# Patient Record
Sex: Female | Born: 1965 | Race: Black or African American | Hispanic: Refuse to answer | Marital: Single | Smoking: Never smoker
Health system: Southern US, Community
[De-identification: ages and names within clinical notes are randomized; demographics above are authoritative.]

## PROBLEM LIST (undated history)

## (undated) DIAGNOSIS — F32A Depression, unspecified: Secondary | ICD-10-CM

## (undated) DIAGNOSIS — R296 Repeated falls: Secondary | ICD-10-CM

## (undated) DIAGNOSIS — E785 Hyperlipidemia, unspecified: Secondary | ICD-10-CM

## (undated) DIAGNOSIS — F988 Other specified behavioral and emotional disorders with onset usually occurring in childhood and adolescence: Secondary | ICD-10-CM

## (undated) DIAGNOSIS — I10 Essential (primary) hypertension: Secondary | ICD-10-CM

## (undated) DIAGNOSIS — K219 Gastro-esophageal reflux disease without esophagitis: Secondary | ICD-10-CM

## (undated) DIAGNOSIS — F4010 Social phobia, unspecified: Secondary | ICD-10-CM

## (undated) DIAGNOSIS — R079 Chest pain, unspecified: Secondary | ICD-10-CM

## (undated) DIAGNOSIS — F329 Major depressive disorder, single episode, unspecified: Secondary | ICD-10-CM

## (undated) DIAGNOSIS — F410 Panic disorder [episodic paroxysmal anxiety] without agoraphobia: Secondary | ICD-10-CM

## (undated) DIAGNOSIS — G4730 Sleep apnea, unspecified: Secondary | ICD-10-CM

## (undated) DIAGNOSIS — R2689 Other abnormalities of gait and mobility: Secondary | ICD-10-CM

## (undated) DIAGNOSIS — J45909 Unspecified asthma, uncomplicated: Secondary | ICD-10-CM

## (undated) DIAGNOSIS — D649 Anemia, unspecified: Secondary | ICD-10-CM

## (undated) DIAGNOSIS — F419 Anxiety disorder, unspecified: Secondary | ICD-10-CM

## (undated) DIAGNOSIS — J449 Chronic obstructive pulmonary disease, unspecified: Secondary | ICD-10-CM

## (undated) DIAGNOSIS — IMO0001 Reserved for inherently not codable concepts without codable children: Secondary | ICD-10-CM

## (undated) DIAGNOSIS — R7303 Prediabetes: Secondary | ICD-10-CM

## (undated) HISTORY — PX: COSMETIC SURGERY: SHX468

## (undated) HISTORY — DX: Hyperlipidemia, unspecified: E78.5

## (undated) HISTORY — DX: Other specified behavioral and emotional disorders with onset usually occurring in childhood and adolescence: F98.8

## (undated) HISTORY — PX: ABDOMINAL HYSTERECTOMY: SHX81

## (undated) HISTORY — DX: Essential (primary) hypertension: I10

## (undated) HISTORY — DX: Anxiety disorder, unspecified: F41.9

## (undated) HISTORY — DX: Social phobia, unspecified: F40.10

## (undated) HISTORY — DX: Depression, unspecified: F32.A

## (undated) HISTORY — DX: Prediabetes: R73.03

## (undated) HISTORY — DX: Chronic obstructive pulmonary disease, unspecified: J44.9

## (undated) HISTORY — PX: CHOLECYSTECTOMY: SHX55

## (undated) HISTORY — DX: Major depressive disorder, single episode, unspecified: F32.9

## (undated) HISTORY — DX: Anemia, unspecified: D64.9

## (undated) HISTORY — PX: FRACTURE SURGERY: SHX138

## (undated) HISTORY — PX: OTHER SURGICAL HISTORY: SHX169

## (undated) HISTORY — PX: ANKLE SURGERY: SHX546

---

## 2000-04-13 ENCOUNTER — Encounter: Admission: RE | Admit: 2000-04-13 | Discharge: 2000-05-22 | Payer: Self-pay | Admitting: Orthopedic Surgery

## 2000-12-04 ENCOUNTER — Encounter (INDEPENDENT_AMBULATORY_CARE_PROVIDER_SITE_OTHER): Payer: Self-pay | Admitting: Specialist

## 2000-12-04 ENCOUNTER — Ambulatory Visit (HOSPITAL_BASED_OUTPATIENT_CLINIC_OR_DEPARTMENT_OTHER): Admission: RE | Admit: 2000-12-04 | Discharge: 2000-12-04 | Payer: Self-pay | Admitting: Orthopedic Surgery

## 2001-04-11 ENCOUNTER — Emergency Department (HOSPITAL_COMMUNITY): Admission: EM | Admit: 2001-04-11 | Discharge: 2001-04-12 | Payer: Self-pay | Admitting: *Deleted

## 2001-06-11 ENCOUNTER — Emergency Department (HOSPITAL_COMMUNITY): Admission: EM | Admit: 2001-06-11 | Discharge: 2001-06-11 | Payer: Self-pay | Admitting: Emergency Medicine

## 2001-06-11 ENCOUNTER — Encounter: Payer: Self-pay | Admitting: Emergency Medicine

## 2001-08-20 ENCOUNTER — Encounter: Payer: Self-pay | Admitting: Obstetrics and Gynecology

## 2001-08-20 ENCOUNTER — Ambulatory Visit (HOSPITAL_COMMUNITY): Admission: RE | Admit: 2001-08-20 | Discharge: 2001-08-20 | Payer: Self-pay | Admitting: Obstetrics and Gynecology

## 2002-03-05 ENCOUNTER — Emergency Department (HOSPITAL_COMMUNITY): Admission: EM | Admit: 2002-03-05 | Discharge: 2002-03-05 | Payer: Self-pay | Admitting: Emergency Medicine

## 2002-12-13 ENCOUNTER — Encounter: Payer: Self-pay | Admitting: Internal Medicine

## 2002-12-13 ENCOUNTER — Ambulatory Visit (HOSPITAL_COMMUNITY): Admission: RE | Admit: 2002-12-13 | Discharge: 2002-12-13 | Payer: Self-pay | Admitting: Internal Medicine

## 2003-11-13 ENCOUNTER — Other Ambulatory Visit: Admission: RE | Admit: 2003-11-13 | Discharge: 2003-11-13 | Payer: Self-pay | Admitting: Obstetrics and Gynecology

## 2004-06-08 ENCOUNTER — Emergency Department (HOSPITAL_COMMUNITY): Admission: EM | Admit: 2004-06-08 | Discharge: 2004-06-08 | Payer: Self-pay | Admitting: Family Medicine

## 2004-06-30 ENCOUNTER — Emergency Department (HOSPITAL_COMMUNITY): Admission: EM | Admit: 2004-06-30 | Discharge: 2004-06-30 | Payer: Self-pay | Admitting: Emergency Medicine

## 2004-09-17 ENCOUNTER — Emergency Department (HOSPITAL_COMMUNITY): Admission: EM | Admit: 2004-09-17 | Discharge: 2004-09-17 | Payer: Self-pay | Admitting: Emergency Medicine

## 2004-11-14 ENCOUNTER — Other Ambulatory Visit: Admission: RE | Admit: 2004-11-14 | Discharge: 2004-11-14 | Payer: Self-pay | Admitting: Obstetrics and Gynecology

## 2005-09-17 ENCOUNTER — Emergency Department (HOSPITAL_COMMUNITY): Admission: EM | Admit: 2005-09-17 | Discharge: 2005-09-17 | Payer: Self-pay | Admitting: Emergency Medicine

## 2006-10-13 ENCOUNTER — Emergency Department (HOSPITAL_COMMUNITY): Admission: EM | Admit: 2006-10-13 | Discharge: 2006-10-13 | Payer: Self-pay | Admitting: Emergency Medicine

## 2006-10-22 ENCOUNTER — Ambulatory Visit: Payer: Self-pay | Admitting: Family Medicine

## 2006-11-02 ENCOUNTER — Encounter: Payer: Self-pay | Admitting: Family Medicine

## 2006-11-02 DIAGNOSIS — I10 Essential (primary) hypertension: Secondary | ICD-10-CM | POA: Insufficient documentation

## 2006-11-02 DIAGNOSIS — F411 Generalized anxiety disorder: Secondary | ICD-10-CM

## 2006-11-02 DIAGNOSIS — F329 Major depressive disorder, single episode, unspecified: Secondary | ICD-10-CM

## 2006-11-13 ENCOUNTER — Ambulatory Visit: Payer: Self-pay | Admitting: Family Medicine

## 2006-11-19 ENCOUNTER — Emergency Department (HOSPITAL_COMMUNITY): Admission: EM | Admit: 2006-11-19 | Discharge: 2006-11-20 | Payer: Self-pay | Admitting: Emergency Medicine

## 2006-12-11 ENCOUNTER — Ambulatory Visit: Payer: Self-pay | Admitting: Family Medicine

## 2006-12-12 ENCOUNTER — Encounter (INDEPENDENT_AMBULATORY_CARE_PROVIDER_SITE_OTHER): Payer: Self-pay | Admitting: Family Medicine

## 2007-01-08 ENCOUNTER — Ambulatory Visit: Payer: Self-pay | Admitting: Family Medicine

## 2007-01-13 ENCOUNTER — Ambulatory Visit (HOSPITAL_COMMUNITY): Admission: RE | Admit: 2007-01-13 | Discharge: 2007-01-13 | Payer: Self-pay | Admitting: Family Medicine

## 2007-01-13 ENCOUNTER — Encounter (INDEPENDENT_AMBULATORY_CARE_PROVIDER_SITE_OTHER): Payer: Self-pay | Admitting: Family Medicine

## 2007-01-14 ENCOUNTER — Telehealth (INDEPENDENT_AMBULATORY_CARE_PROVIDER_SITE_OTHER): Payer: Self-pay | Admitting: Family Medicine

## 2007-01-15 ENCOUNTER — Telehealth (INDEPENDENT_AMBULATORY_CARE_PROVIDER_SITE_OTHER): Payer: Self-pay | Admitting: Family Medicine

## 2007-01-29 ENCOUNTER — Other Ambulatory Visit: Admission: RE | Admit: 2007-01-29 | Discharge: 2007-01-29 | Payer: Self-pay | Admitting: Family Medicine

## 2007-01-29 ENCOUNTER — Ambulatory Visit: Payer: Self-pay | Admitting: Family Medicine

## 2007-01-29 DIAGNOSIS — E785 Hyperlipidemia, unspecified: Secondary | ICD-10-CM | POA: Insufficient documentation

## 2007-01-29 LAB — CONVERTED CEMR LAB: Pap Smear: NORMAL

## 2007-02-03 ENCOUNTER — Telehealth (INDEPENDENT_AMBULATORY_CARE_PROVIDER_SITE_OTHER): Payer: Self-pay | Admitting: Family Medicine

## 2007-02-08 ENCOUNTER — Encounter (INDEPENDENT_AMBULATORY_CARE_PROVIDER_SITE_OTHER): Payer: Self-pay | Admitting: Family Medicine

## 2007-02-08 ENCOUNTER — Ambulatory Visit (HOSPITAL_COMMUNITY): Admission: RE | Admit: 2007-02-08 | Discharge: 2007-02-08 | Payer: Self-pay | Admitting: Family Medicine

## 2007-02-09 ENCOUNTER — Encounter (INDEPENDENT_AMBULATORY_CARE_PROVIDER_SITE_OTHER): Payer: Self-pay | Admitting: Family Medicine

## 2007-02-18 ENCOUNTER — Encounter (INDEPENDENT_AMBULATORY_CARE_PROVIDER_SITE_OTHER): Payer: Self-pay | Admitting: Family Medicine

## 2007-04-30 ENCOUNTER — Ambulatory Visit: Payer: Self-pay | Admitting: Family Medicine

## 2007-04-30 ENCOUNTER — Encounter (INDEPENDENT_AMBULATORY_CARE_PROVIDER_SITE_OTHER): Payer: Self-pay | Admitting: Internal Medicine

## 2007-05-10 ENCOUNTER — Telehealth (INDEPENDENT_AMBULATORY_CARE_PROVIDER_SITE_OTHER): Payer: Self-pay | Admitting: Family Medicine

## 2007-05-10 ENCOUNTER — Ambulatory Visit: Payer: Self-pay | Admitting: Cardiology

## 2007-05-11 ENCOUNTER — Inpatient Hospital Stay (HOSPITAL_COMMUNITY): Admission: EM | Admit: 2007-05-11 | Discharge: 2007-05-11 | Payer: Self-pay | Admitting: Emergency Medicine

## 2007-05-14 ENCOUNTER — Encounter (HOSPITAL_COMMUNITY): Admission: RE | Admit: 2007-05-14 | Discharge: 2007-06-13 | Payer: Self-pay | Admitting: Internal Medicine

## 2007-05-14 ENCOUNTER — Ambulatory Visit: Payer: Self-pay | Admitting: Internal Medicine

## 2007-05-21 ENCOUNTER — Ambulatory Visit: Payer: Self-pay | Admitting: Family Medicine

## 2007-05-21 DIAGNOSIS — J449 Chronic obstructive pulmonary disease, unspecified: Secondary | ICD-10-CM

## 2007-05-22 ENCOUNTER — Encounter (INDEPENDENT_AMBULATORY_CARE_PROVIDER_SITE_OTHER): Payer: Self-pay | Admitting: Family Medicine

## 2007-07-02 ENCOUNTER — Ambulatory Visit: Payer: Self-pay | Admitting: Family Medicine

## 2007-07-04 LAB — CONVERTED CEMR LAB
BUN: 9 mg/dL (ref 6–23)
Calcium: 8.9 mg/dL (ref 8.4–10.5)
Chloride: 106 meq/L (ref 96–112)
Potassium: 4.1 meq/L (ref 3.5–5.3)
Sodium: 138 meq/L (ref 135–145)

## 2007-07-05 ENCOUNTER — Telehealth (INDEPENDENT_AMBULATORY_CARE_PROVIDER_SITE_OTHER): Payer: Self-pay | Admitting: *Deleted

## 2007-07-15 ENCOUNTER — Encounter (INDEPENDENT_AMBULATORY_CARE_PROVIDER_SITE_OTHER): Payer: Self-pay | Admitting: Family Medicine

## 2007-07-23 ENCOUNTER — Ambulatory Visit: Payer: Self-pay | Admitting: Family Medicine

## 2007-08-11 ENCOUNTER — Ambulatory Visit: Payer: Self-pay | Admitting: Family Medicine

## 2007-09-07 ENCOUNTER — Ambulatory Visit: Payer: Self-pay | Admitting: Family Medicine

## 2007-09-07 LAB — CONVERTED CEMR LAB
Bilirubin Urine: NEGATIVE
Glucose, Urine, Semiquant: NEGATIVE
Ketones, urine, test strip: NEGATIVE
Protein, U semiquant: NEGATIVE
Specific Gravity, Urine: 1.02
Urobilinogen, UA: 4
WBC Urine, dipstick: NEGATIVE
pH: 6.5

## 2007-09-08 LAB — CONVERTED CEMR LAB
Basophils Absolute: 0.1 10*3/uL (ref 0.0–0.1)
CO2: 21 meq/L (ref 19–32)
Calcium: 8.9 mg/dL (ref 8.4–10.5)
Chloride: 105 meq/L (ref 96–112)
Eosinophils Absolute: 0.1 10*3/uL (ref 0.0–0.7)
Ferritin: 4 ng/mL — ABNORMAL LOW (ref 10–291)
Glucose, Bld: 91 mg/dL (ref 70–99)
HCT: 35.5 % — ABNORMAL LOW (ref 36.0–46.0)
Iron: 22 ug/dL — ABNORMAL LOW (ref 42–145)
MCV: 80.9 fL (ref 78.0–100.0)
Monocytes Relative: 11 % (ref 3–11)
Neutro Abs: 2.7 10*3/uL (ref 1.7–7.7)
Neutrophils Relative %: 49 % (ref 43–77)
Potassium: 4.3 meq/L (ref 3.5–5.3)
Retic Count, Absolute: 70.2 (ref 19.0–186.0)

## 2007-10-19 ENCOUNTER — Ambulatory Visit: Payer: Self-pay | Admitting: Family Medicine

## 2007-10-19 ENCOUNTER — Telehealth (INDEPENDENT_AMBULATORY_CARE_PROVIDER_SITE_OTHER): Payer: Self-pay | Admitting: *Deleted

## 2007-10-19 LAB — CONVERTED CEMR LAB
Bilirubin Urine: NEGATIVE
Specific Gravity, Urine: 1.015
WBC Urine, dipstick: NEGATIVE

## 2007-10-27 ENCOUNTER — Ambulatory Visit (HOSPITAL_COMMUNITY): Admission: RE | Admit: 2007-10-27 | Discharge: 2007-10-27 | Payer: Self-pay | Admitting: Family Medicine

## 2007-10-27 ENCOUNTER — Telehealth (INDEPENDENT_AMBULATORY_CARE_PROVIDER_SITE_OTHER): Payer: Self-pay | Admitting: *Deleted

## 2007-10-28 ENCOUNTER — Ambulatory Visit: Payer: Self-pay | Admitting: Family Medicine

## 2007-10-28 ENCOUNTER — Telehealth (INDEPENDENT_AMBULATORY_CARE_PROVIDER_SITE_OTHER): Payer: Self-pay | Admitting: *Deleted

## 2007-11-17 ENCOUNTER — Encounter (INDEPENDENT_AMBULATORY_CARE_PROVIDER_SITE_OTHER): Payer: Self-pay | Admitting: Family Medicine

## 2007-12-06 ENCOUNTER — Encounter (INDEPENDENT_AMBULATORY_CARE_PROVIDER_SITE_OTHER): Payer: Self-pay | Admitting: Family Medicine

## 2007-12-06 ENCOUNTER — Inpatient Hospital Stay (HOSPITAL_COMMUNITY): Admission: RE | Admit: 2007-12-06 | Discharge: 2007-12-09 | Payer: Self-pay | Admitting: Obstetrics and Gynecology

## 2007-12-06 ENCOUNTER — Encounter: Payer: Self-pay | Admitting: Obstetrics and Gynecology

## 2007-12-06 LAB — CONVERTED CEMR LAB

## 2007-12-16 ENCOUNTER — Ambulatory Visit: Payer: Self-pay | Admitting: Family Medicine

## 2008-01-14 ENCOUNTER — Ambulatory Visit (HOSPITAL_COMMUNITY): Admission: RE | Admit: 2008-01-14 | Discharge: 2008-01-14 | Payer: Self-pay | Admitting: Obstetrics and Gynecology

## 2008-01-27 ENCOUNTER — Ambulatory Visit: Payer: Self-pay | Admitting: Family Medicine

## 2008-01-31 ENCOUNTER — Encounter (INDEPENDENT_AMBULATORY_CARE_PROVIDER_SITE_OTHER): Payer: Self-pay | Admitting: Family Medicine

## 2008-02-01 LAB — CONVERTED CEMR LAB
ALT: 15 units/L (ref 0–35)
AST: 15 units/L (ref 0–37)
Albumin: 4.2 g/dL (ref 3.5–5.2)
Alkaline Phosphatase: 36 units/L — ABNORMAL LOW (ref 39–117)
BUN: 8 mg/dL (ref 6–23)
CO2: 21 meq/L (ref 19–32)
Chloride: 106 meq/L (ref 96–112)
Eosinophils Absolute: 0.1 10*3/uL (ref 0.0–0.7)
Eosinophils Relative: 2 % (ref 0–5)
Glucose, Bld: 106 mg/dL — ABNORMAL HIGH (ref 70–99)
Hemoglobin: 13.3 g/dL (ref 12.0–15.0)
Lymphocytes Relative: 39 % (ref 12–46)
MCHC: 32 g/dL (ref 30.0–36.0)
Monocytes Absolute: 0.3 10*3/uL (ref 0.1–1.0)
Neutrophils Relative %: 53 % (ref 43–77)
Platelets: 415 10*3/uL — ABNORMAL HIGH (ref 150–400)
Sodium: 140 meq/L (ref 135–145)
Triglycerides: 83 mg/dL (ref <150)
WBC: 4.4 10*3/uL (ref 4.0–10.5)

## 2008-02-25 ENCOUNTER — Ambulatory Visit: Payer: Self-pay | Admitting: Family Medicine

## 2008-03-05 IMAGING — CR DG CHEST 2V
2 series · 2 of 2 positions shown · non-contrast
Comparison: None.

CLINICAL DATA: Left chest pain. Shortness of breath.

[view not recorded (1 of 2)]
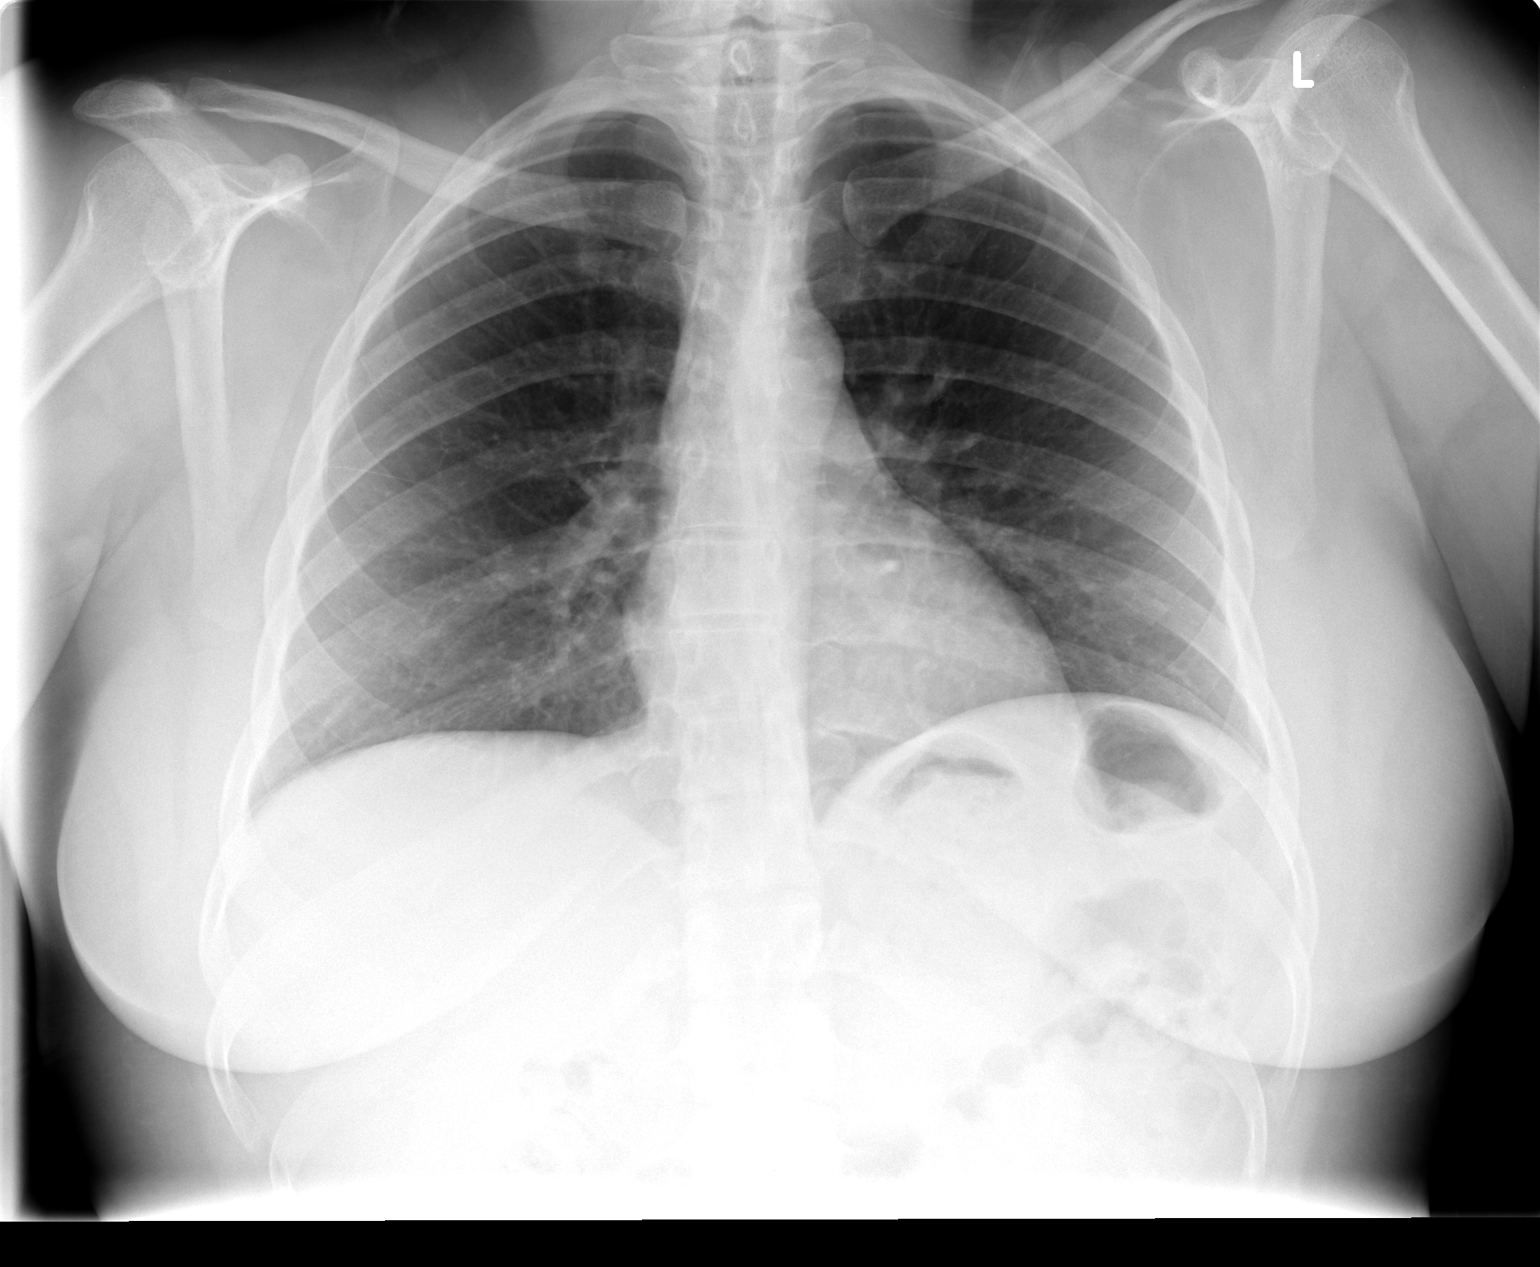

[view not recorded (2 of 2)]
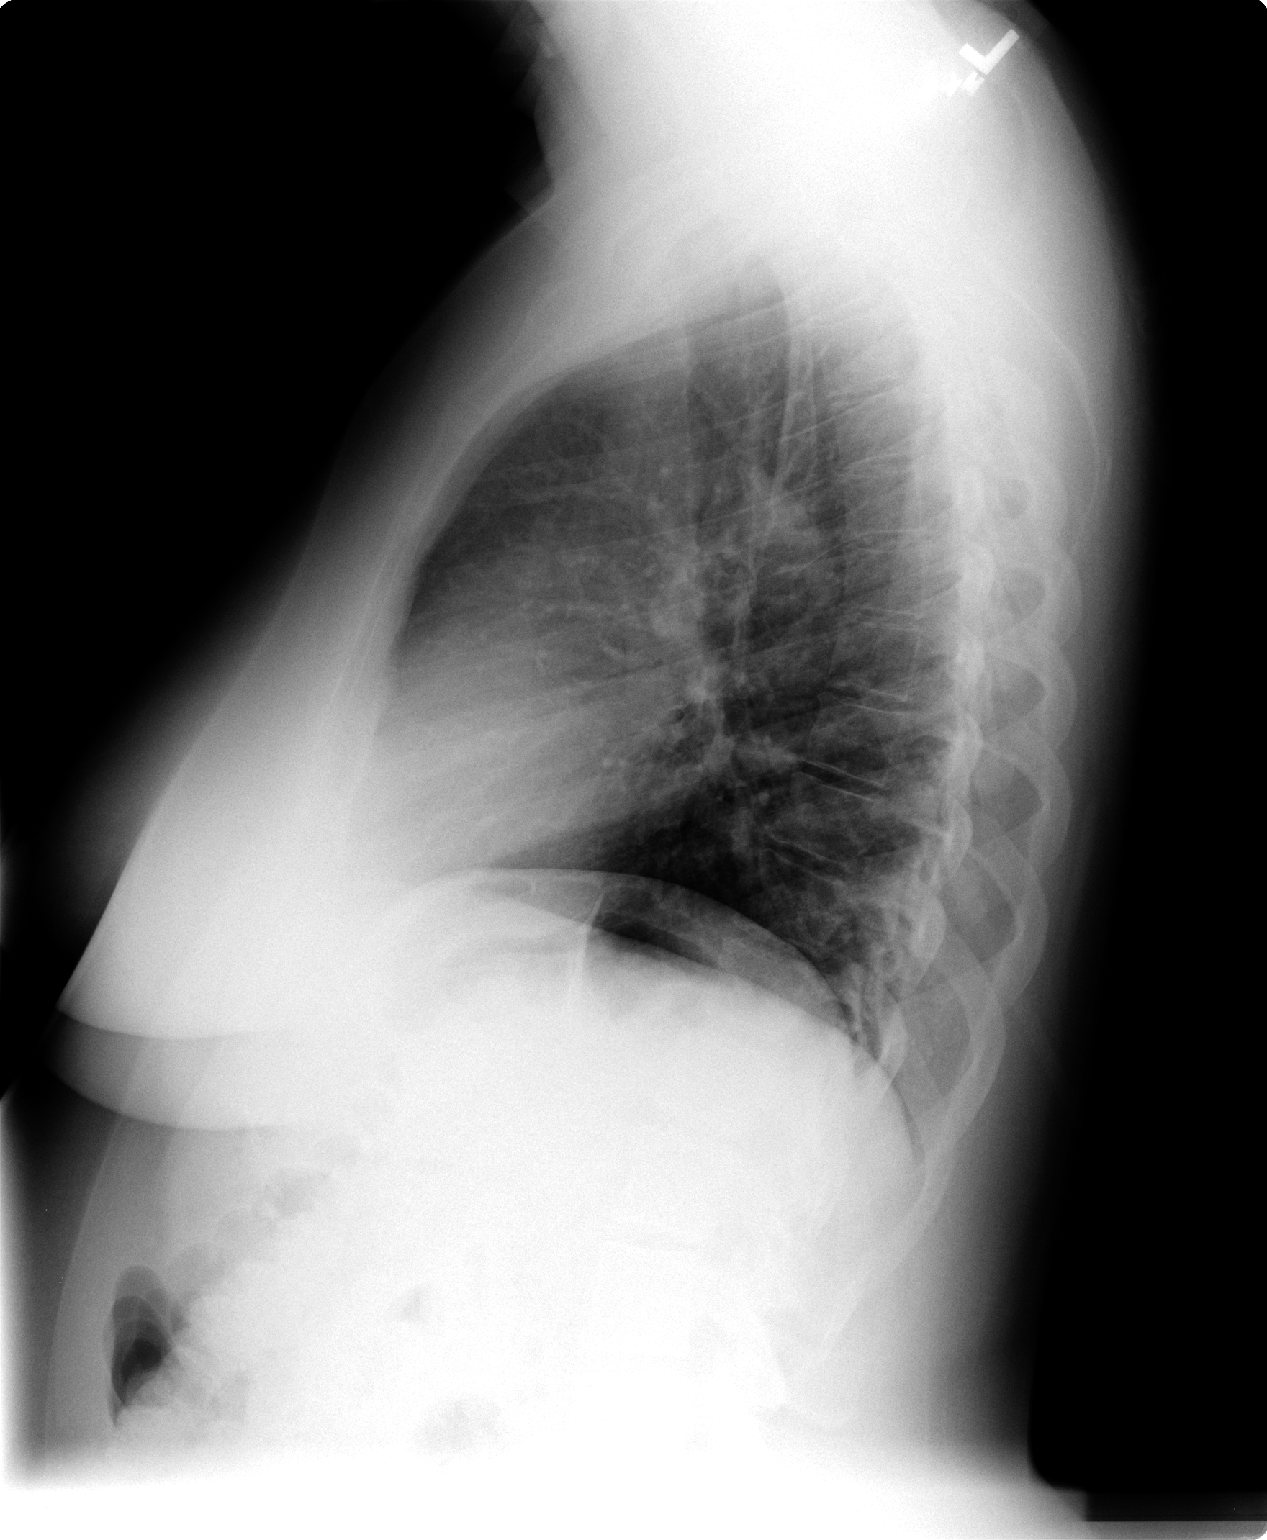

[2 of 2 positions shown; findings below may reference images not displayed]

CHEST - 2 VIEW:

The lungs are clear.  The cardiopericardial silhouette is within normal limits
for size.  Visualized bony structures of the thorax are intact.
IMPRESSION: No acute cardiopulmonary process

## 2008-03-09 ENCOUNTER — Ambulatory Visit: Payer: Self-pay | Admitting: Family Medicine

## 2008-03-09 ENCOUNTER — Telehealth (INDEPENDENT_AMBULATORY_CARE_PROVIDER_SITE_OTHER): Payer: Self-pay | Admitting: *Deleted

## 2008-03-09 ENCOUNTER — Ambulatory Visit (HOSPITAL_COMMUNITY): Admission: RE | Admit: 2008-03-09 | Discharge: 2008-03-09 | Payer: Self-pay | Admitting: Family Medicine

## 2008-03-09 ENCOUNTER — Telehealth (INDEPENDENT_AMBULATORY_CARE_PROVIDER_SITE_OTHER): Payer: Self-pay | Admitting: Family Medicine

## 2008-03-23 ENCOUNTER — Ambulatory Visit: Payer: Self-pay | Admitting: Family Medicine

## 2008-03-23 DIAGNOSIS — E669 Obesity, unspecified: Secondary | ICD-10-CM

## 2008-06-22 ENCOUNTER — Ambulatory Visit: Payer: Self-pay | Admitting: Family Medicine

## 2008-06-26 LAB — CONVERTED CEMR LAB
Basophils Absolute: 0 10*3/uL (ref 0.0–0.1)
CO2: 23 meq/L (ref 19–32)
Eosinophils Absolute: 0.1 10*3/uL (ref 0.0–0.7)
Eosinophils Relative: 1 % (ref 0–5)
Hemoglobin: 13.1 g/dL (ref 12.0–15.0)
Lymphocytes Relative: 36 % (ref 12–46)
Lymphs Abs: 2.2 10*3/uL (ref 0.7–4.0)
MCHC: 32.9 g/dL (ref 30.0–36.0)
Monocytes Absolute: 0.5 10*3/uL (ref 0.1–1.0)
Neutro Abs: 3.3 10*3/uL (ref 1.7–7.7)
Neutrophils Relative %: 54 % (ref 43–77)
Platelets: 389 10*3/uL (ref 150–400)
RBC: 4.35 M/uL (ref 3.87–5.11)
WBC: 6.2 10*3/uL (ref 4.0–10.5)

## 2008-07-06 ENCOUNTER — Ambulatory Visit: Payer: Self-pay | Admitting: Family Medicine

## 2008-08-03 ENCOUNTER — Ambulatory Visit: Payer: Self-pay | Admitting: Family Medicine

## 2008-10-05 ENCOUNTER — Ambulatory Visit (HOSPITAL_COMMUNITY): Admission: RE | Admit: 2008-10-05 | Discharge: 2008-10-05 | Payer: Self-pay | Admitting: Family Medicine

## 2008-10-27 ENCOUNTER — Ambulatory Visit: Payer: Self-pay | Admitting: Family Medicine

## 2008-11-09 IMAGING — US US PELVIS COMPLETE MODIFY
1 series · 14 of 25 positions shown · non-contrast
Comparison: none

HISTORY: Lower abdominal pain, prior hysterectomy, ovarian cysts

[Series 1: us pelvis complete modify · 0.24mm/px · 14 of 52 slices shown]
[im 1/52]
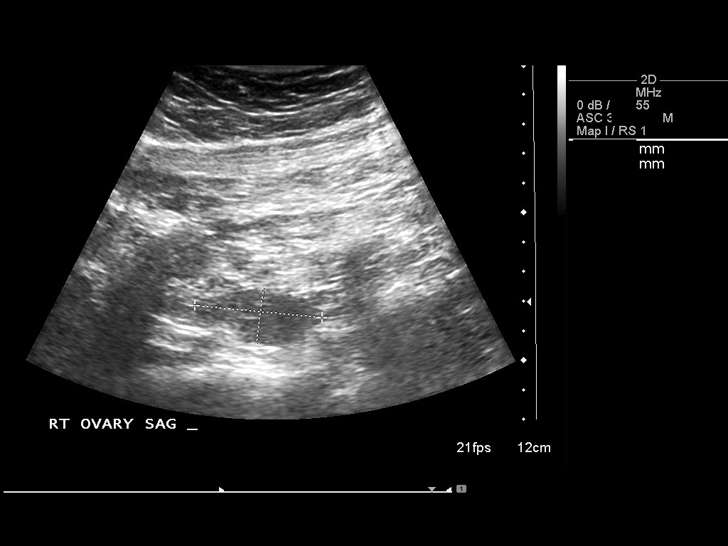
[im 5/52]
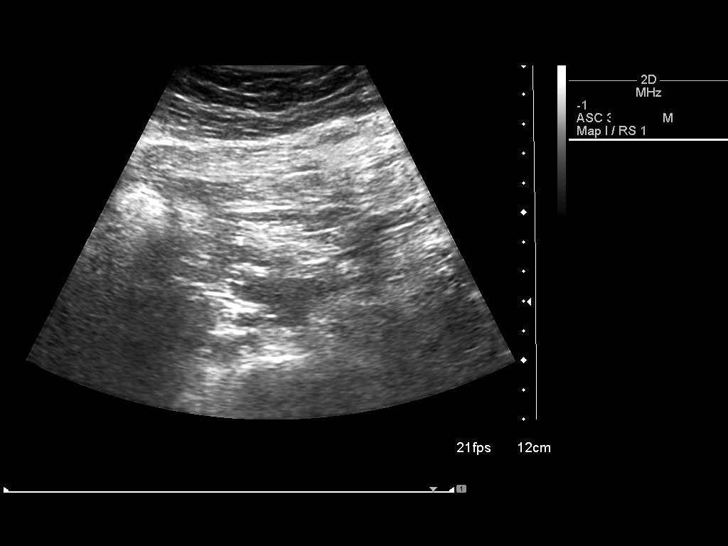
[im 9/52]
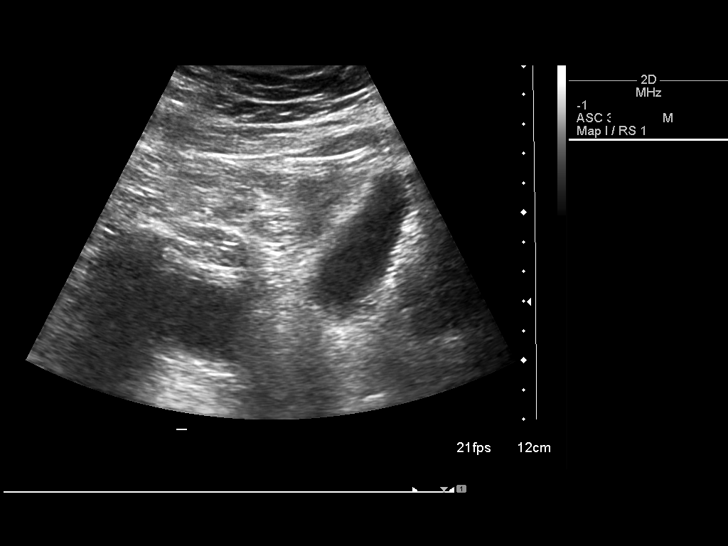
[im 13/52]
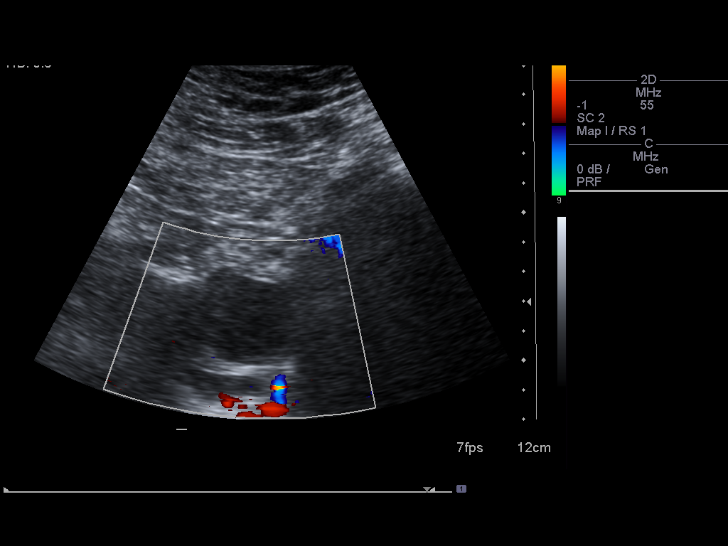
[im 18/52]
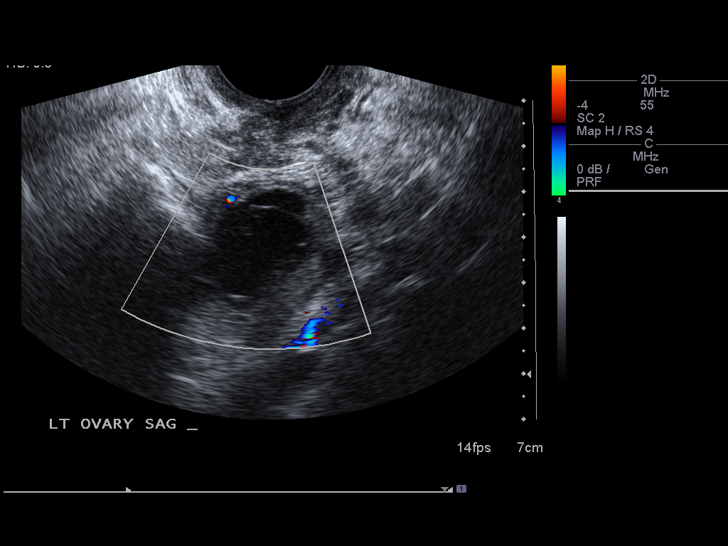
[im 20/52]
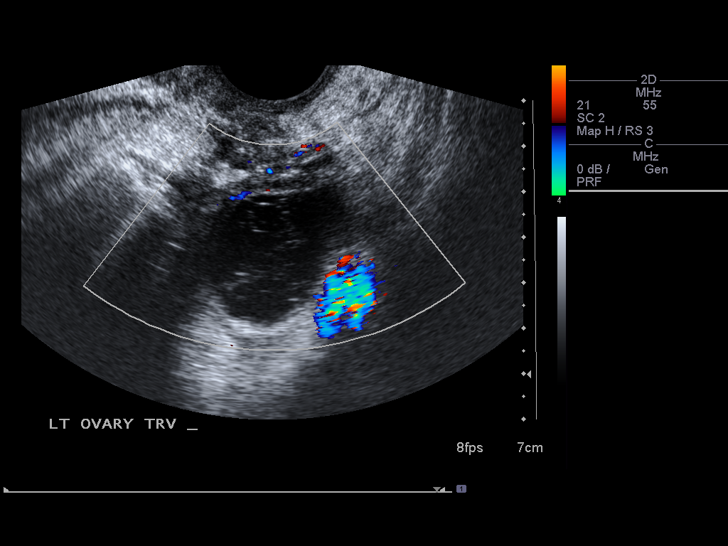
[im 24/52]
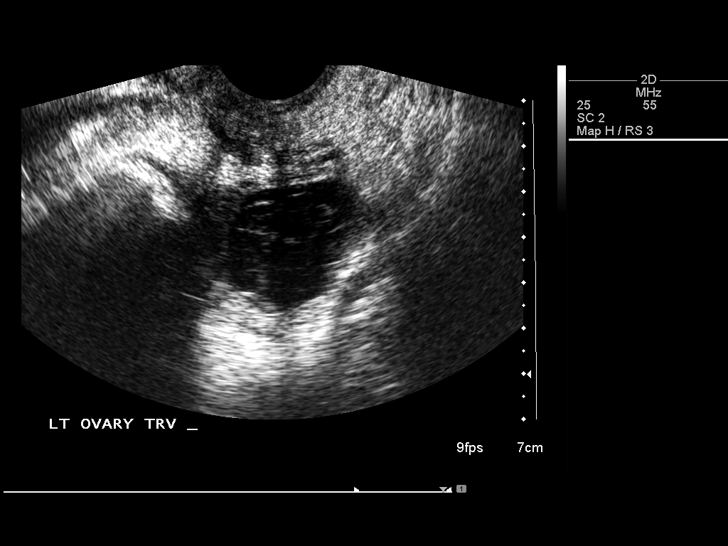
[im 28/52]
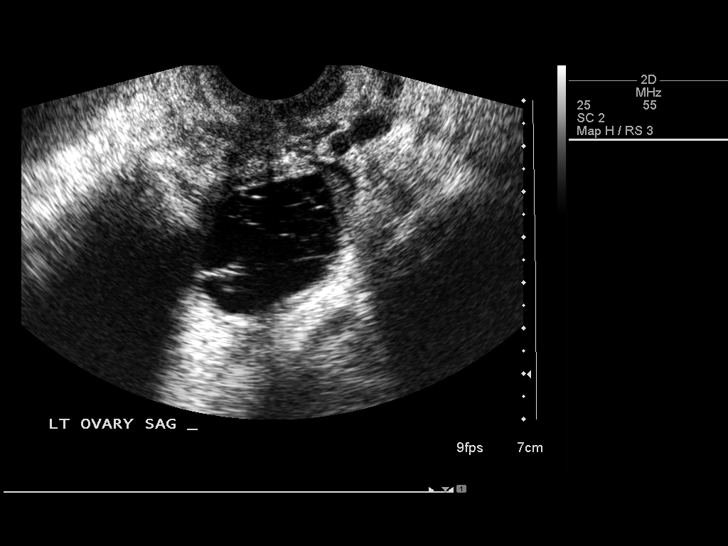
[im 32/52]
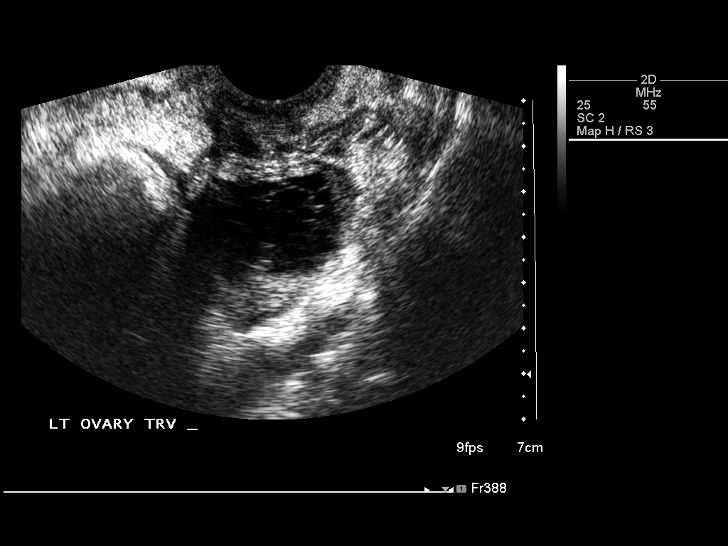
[im 35/52]
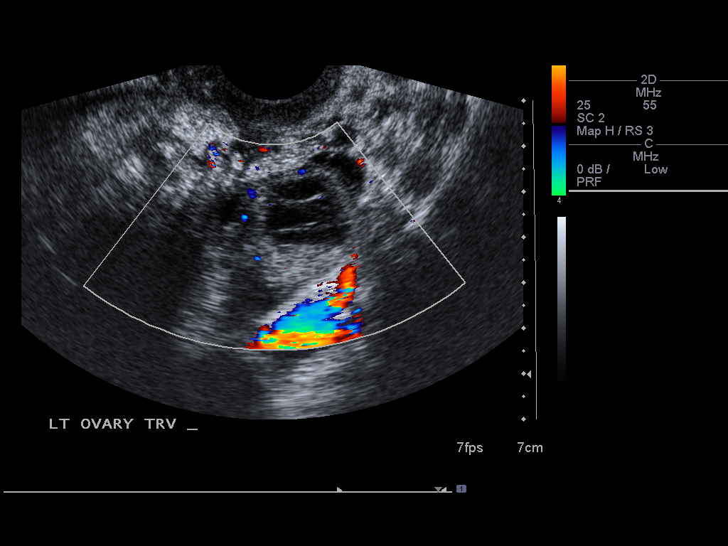
[im 39/52]
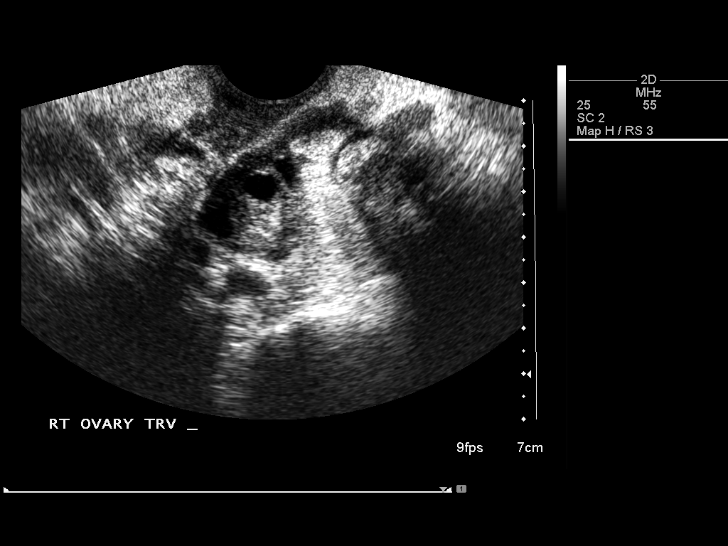
[im 43/52]
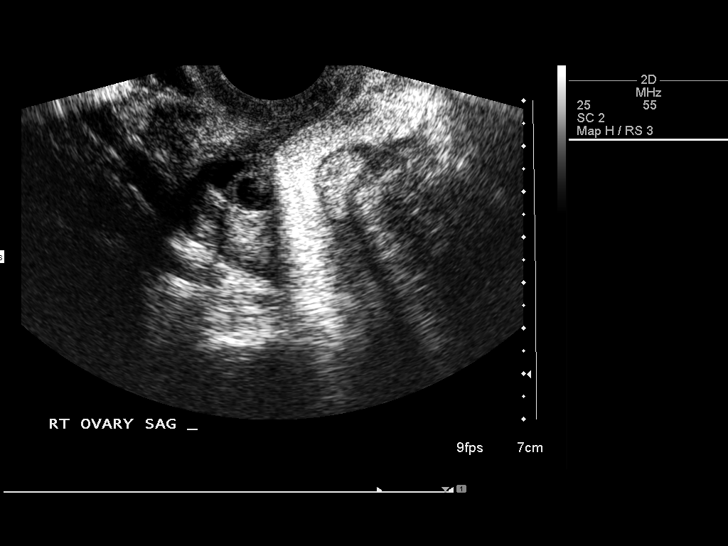
[im 47/52]
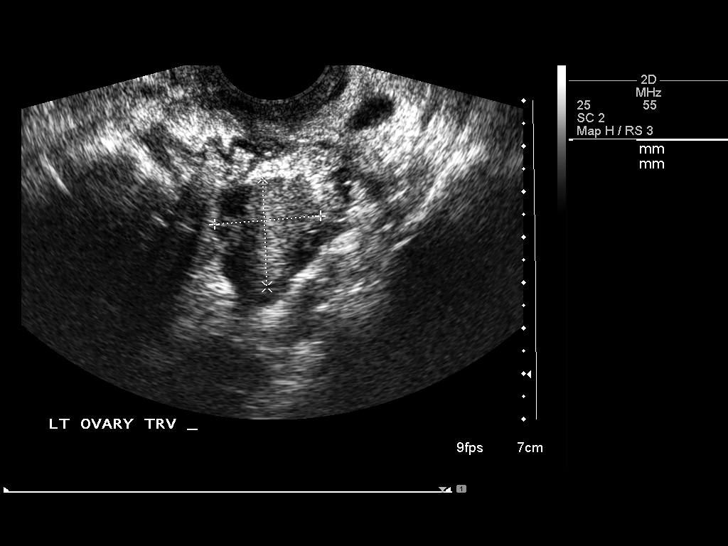
[im 52/52]
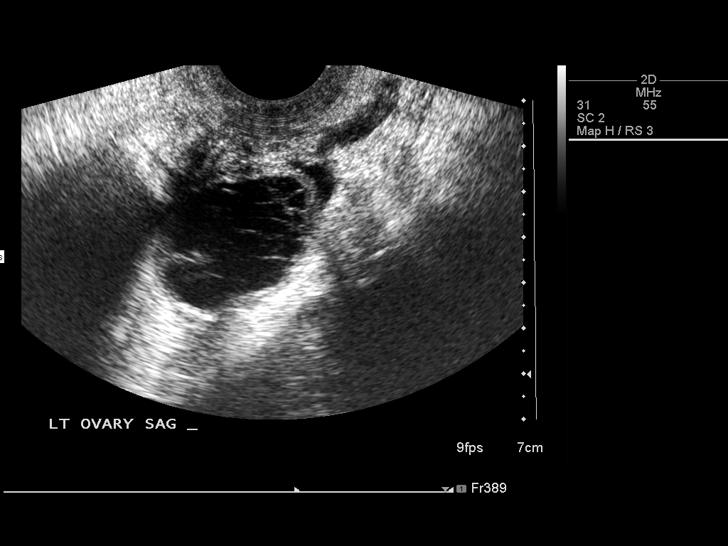

[14 of 25 positions shown; findings below may reference images not displayed]

ULTRASOUND PELVIS TRANSABDOMINAL COMPLETE MODIFIED:
ULTRASOUND PELVIS TRANSVAGINAL NON-OB:

Transabdominal and endovaginal sonography of pelvis performed.
Uterus, ovaries, adnexal regions, and pelvic cul-de-sac assessed.

Uterus surgically absent.
No free pelvic fluid.
Left ovary measures 4.1 x 2.9 x 3.5 cm.
Complex hypoechoic mass seen in left ovary, 3.7 x 2.9 x 2.7 cm, question
hemorrhagic or complicated cyst.
Right ovary measures 2.6 x 2.6 x 3.6 cm and contains multiple follicles.
No other adnexal masses
On color Doppler assessment, no blood flow seen within the complicated cyst in
the left ovary.
No discrete mural nodularity.
IMPRESSION: Status post hysterectomy with unremarkable right ovary.
Complex cystic mass in left ovary, 3.7 cm greatest size, containing multiple
septations and low level internal echogenicity, new since previous study
Followup ultrasound recommended in 2 months to reassess stability, in order to
exclude developing cystic ovarian neoplasm.

## 2009-01-03 IMAGING — CR DG CHEST 2V
2 series · 2 of 2 positions shown · non-contrast
Comparison: 05/10/2007

CLINICAL DATA: COPD, dyspnea, cough, hypertension

CHEST - 2 VIEW

[view not recorded (1 of 2)]
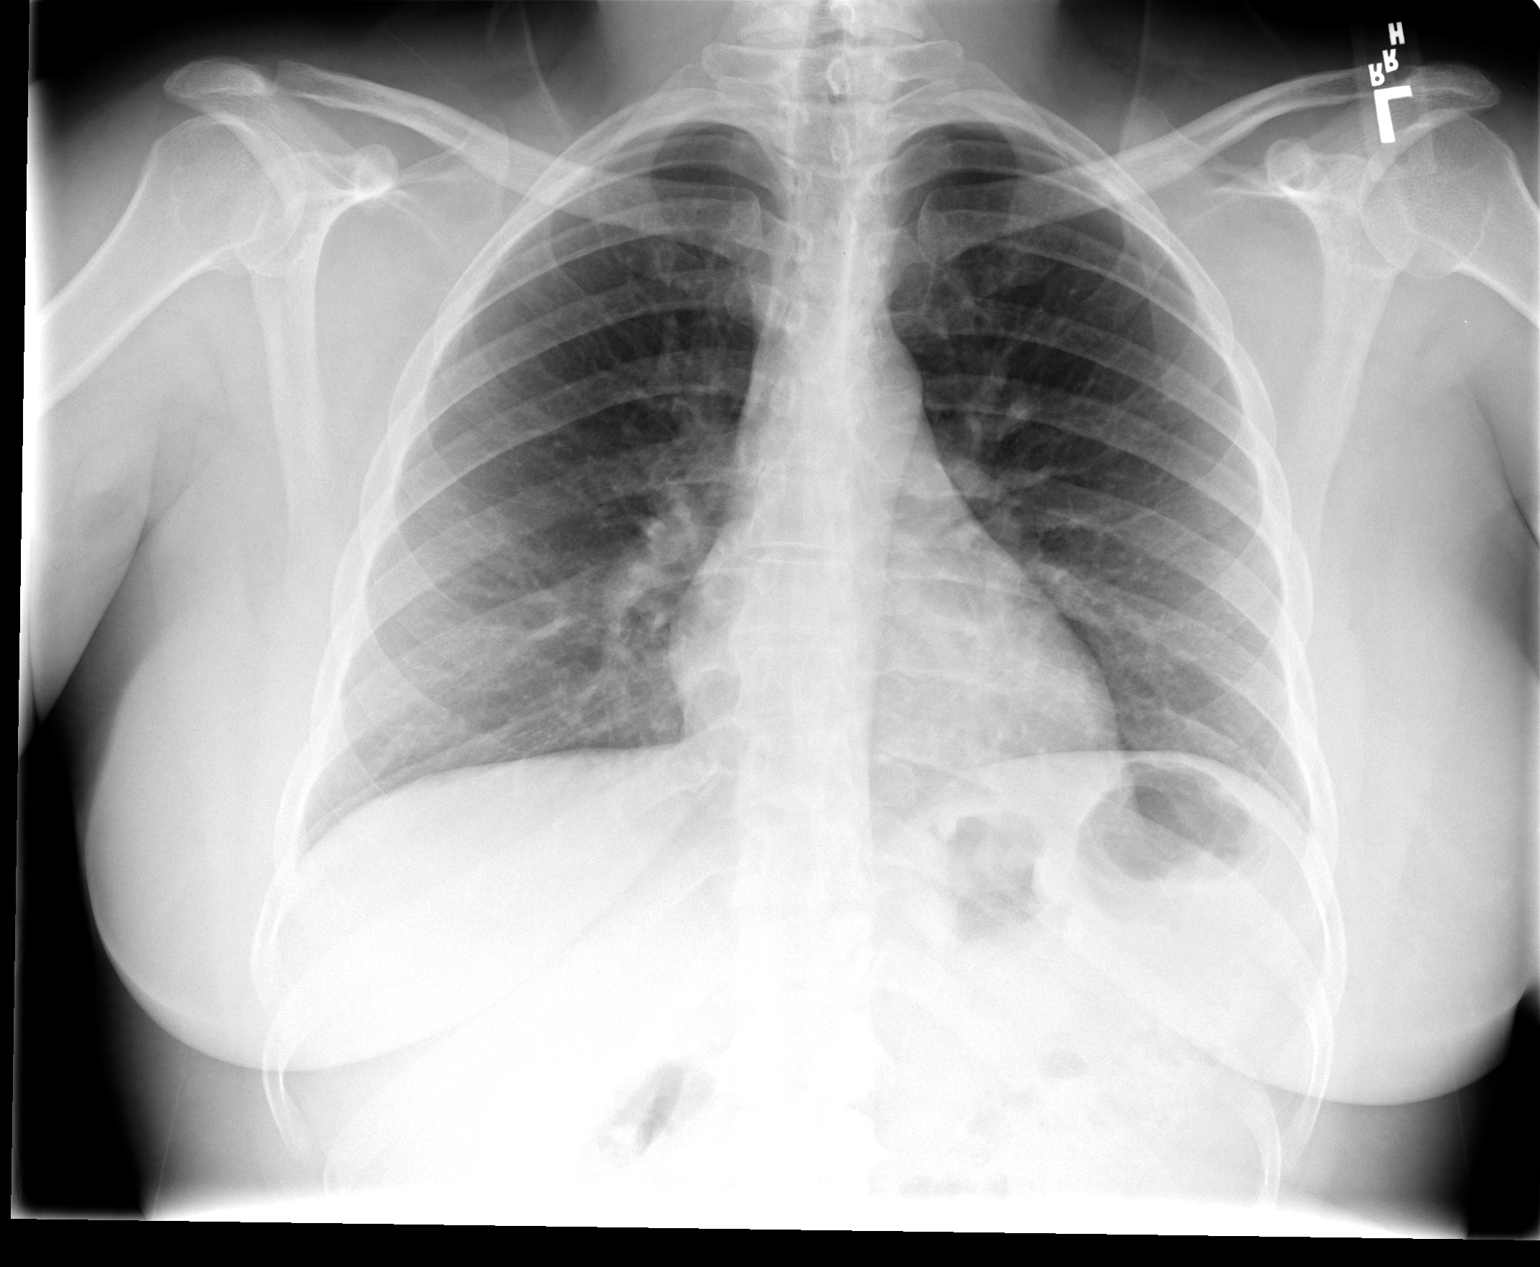

[view not recorded (2 of 2)]
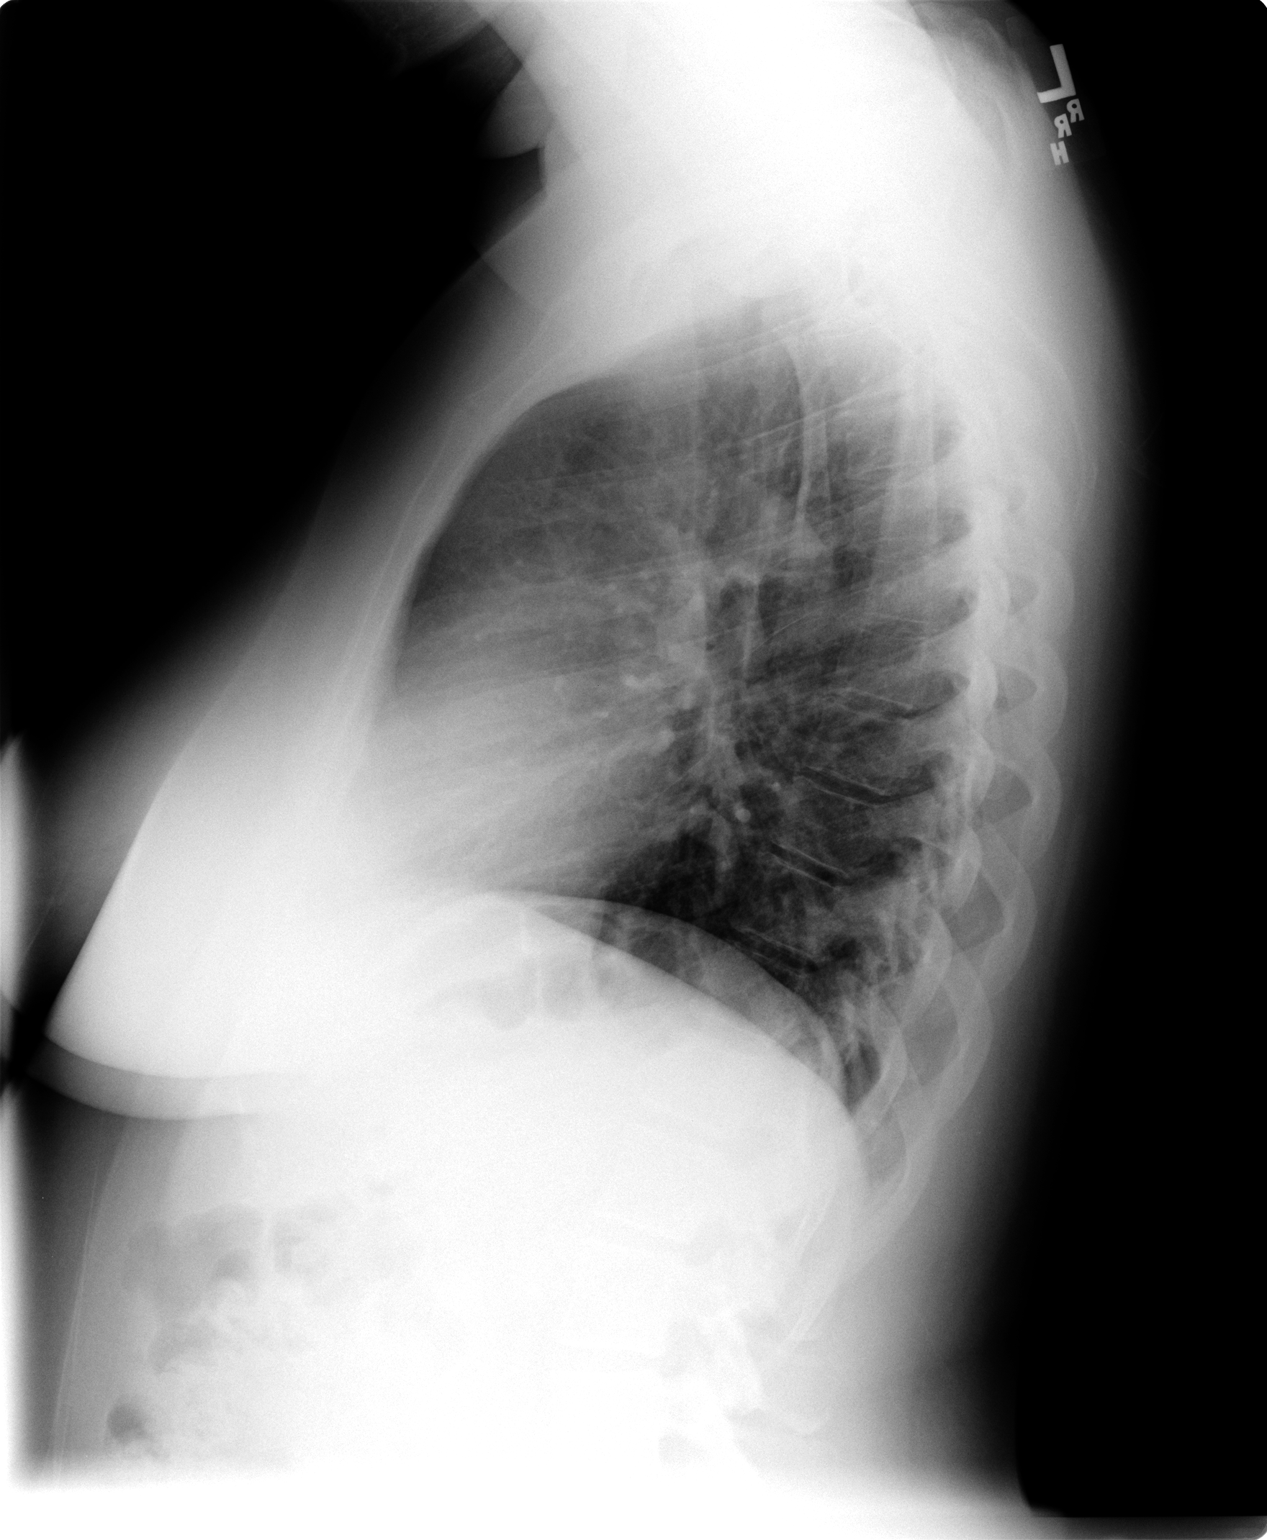

[2 of 2 positions shown; findings below may reference images not displayed]

FINDINGS: Normal heart size, mediastinal contours, and pulmonary vascularity.
Minimal chronic peribronchial thickening.
Lungs otherwise clear.
Bones unremarkable.
IMPRESSION: No acute abnormalities.
Minimal chronic bronchitic changes.

## 2009-02-02 ENCOUNTER — Ambulatory Visit: Payer: Self-pay | Admitting: Family Medicine

## 2009-02-02 LAB — CONVERTED CEMR LAB

## 2009-02-05 ENCOUNTER — Encounter (INDEPENDENT_AMBULATORY_CARE_PROVIDER_SITE_OTHER): Payer: Self-pay | Admitting: Family Medicine

## 2009-02-05 ENCOUNTER — Ambulatory Visit (HOSPITAL_COMMUNITY): Admission: RE | Admit: 2009-02-05 | Discharge: 2009-02-05 | Payer: Self-pay | Admitting: Family Medicine

## 2009-02-08 ENCOUNTER — Encounter (INDEPENDENT_AMBULATORY_CARE_PROVIDER_SITE_OTHER): Payer: Self-pay | Admitting: Family Medicine

## 2009-02-09 LAB — CONVERTED CEMR LAB
AST: 13 units/L (ref 0–37)
Albumin: 4 g/dL (ref 3.5–5.2)
BUN: 10 mg/dL (ref 6–23)
Band Neutrophils: 0 % (ref 0–10)
Calcium: 9 mg/dL (ref 8.4–10.5)
Chloride: 107 meq/L (ref 96–112)
Eosinophils Relative: 1 % (ref 0–5)
HCT: 39 % (ref 36.0–46.0)
HDL: 45 mg/dL (ref >39)
Lymphocytes Relative: 34 % (ref 12–46)
Lymphs Abs: 1.8 10*3/uL (ref 0.7–4.0)
MCV: 93.8 fL (ref 78.0–100.0)
Monocytes Absolute: 0.4 10*3/uL (ref 0.1–1.0)
Potassium: 4.5 meq/L (ref 3.5–5.3)
RBC: 4.16 M/uL (ref 3.87–5.11)
Sodium: 140 meq/L (ref 135–145)
TSH: 1.317 microintl units/mL (ref 0.350–4.50)
Total Protein: 7 g/dL (ref 6.0–8.3)
WBC: 5.3 10*3/uL (ref 4.0–10.5)

## 2009-02-27 ENCOUNTER — Encounter (INDEPENDENT_AMBULATORY_CARE_PROVIDER_SITE_OTHER): Payer: Self-pay | Admitting: Family Medicine

## 2009-02-28 ENCOUNTER — Ambulatory Visit (HOSPITAL_COMMUNITY): Admission: RE | Admit: 2009-02-28 | Discharge: 2009-02-28 | Payer: Self-pay | Admitting: Oncology

## 2009-03-01 ENCOUNTER — Emergency Department (HOSPITAL_COMMUNITY): Admission: EM | Admit: 2009-03-01 | Discharge: 2009-03-01 | Payer: Self-pay | Admitting: Emergency Medicine

## 2009-04-12 ENCOUNTER — Ambulatory Visit: Payer: Self-pay | Admitting: Family Medicine

## 2009-05-16 ENCOUNTER — Telehealth (INDEPENDENT_AMBULATORY_CARE_PROVIDER_SITE_OTHER): Payer: Self-pay | Admitting: *Deleted

## 2009-05-29 ENCOUNTER — Ambulatory Visit: Payer: Self-pay | Admitting: Family Medicine

## 2009-06-06 ENCOUNTER — Telehealth (INDEPENDENT_AMBULATORY_CARE_PROVIDER_SITE_OTHER): Payer: Self-pay | Admitting: *Deleted

## 2009-06-13 ENCOUNTER — Ambulatory Visit: Payer: Self-pay | Admitting: Family Medicine

## 2009-07-04 ENCOUNTER — Emergency Department (HOSPITAL_COMMUNITY): Admission: EM | Admit: 2009-07-04 | Discharge: 2009-07-04 | Payer: Self-pay | Admitting: Emergency Medicine

## 2009-07-05 ENCOUNTER — Ambulatory Visit: Payer: Self-pay | Admitting: Family Medicine

## 2009-07-11 ENCOUNTER — Ambulatory Visit: Payer: Self-pay | Admitting: Family Medicine

## 2009-07-11 LAB — CONVERTED CEMR LAB
Bilirubin Urine: NEGATIVE
Blood in Urine, dipstick: NEGATIVE
Glucose, Urine, Semiquant: NEGATIVE
Ketones, urine, test strip: NEGATIVE
Specific Gravity, Urine: 1.01

## 2009-07-12 ENCOUNTER — Encounter (INDEPENDENT_AMBULATORY_CARE_PROVIDER_SITE_OTHER): Payer: Self-pay | Admitting: Family Medicine

## 2009-07-12 LAB — CONVERTED CEMR LAB
ALT: 20 units/L (ref 0–35)
Albumin: 3.8 g/dL (ref 3.5–5.2)
Alkaline Phosphatase: 33 units/L — ABNORMAL LOW (ref 39–117)
Amylase: 92 units/L (ref 27–131)
CO2: 24 meq/L (ref 19–32)
Glucose, Bld: 108 mg/dL — ABNORMAL HIGH (ref 70–99)
Hemoglobin: 14 g/dL (ref 12.0–15.0)
Lipase: 27 units/L (ref 11–59)
Lymphocytes Relative: 26 % (ref 12–46)
Monocytes Absolute: 0.3 10*3/uL (ref 0.1–1.0)
Monocytes Relative: 6 % (ref 3–12)
Neutro Abs: 2.9 10*3/uL (ref 1.7–7.7)
Neutrophils Relative %: 62 % (ref 43–77)
Potassium: 4 meq/L (ref 3.5–5.3)
RBC: 4.39 M/uL (ref 3.87–5.11)
Sodium: 137 meq/L (ref 135–145)
Total Bilirubin: 1.4 mg/dL — ABNORMAL HIGH (ref 0.3–1.2)
Total Protein: 7.1 g/dL (ref 6.0–8.3)
WBC: 4.7 10*3/uL (ref 4.0–10.5)

## 2009-07-17 ENCOUNTER — Encounter (INDEPENDENT_AMBULATORY_CARE_PROVIDER_SITE_OTHER): Payer: Self-pay | Admitting: Family Medicine

## 2009-07-27 ENCOUNTER — Ambulatory Visit (HOSPITAL_COMMUNITY): Admission: RE | Admit: 2009-07-27 | Discharge: 2009-07-27 | Payer: Self-pay | Admitting: Family Medicine

## 2009-08-01 ENCOUNTER — Encounter (HOSPITAL_COMMUNITY): Admission: RE | Admit: 2009-08-01 | Discharge: 2009-08-31 | Payer: Self-pay | Admitting: Sports Medicine

## 2009-08-22 ENCOUNTER — Ambulatory Visit: Payer: Self-pay | Admitting: Family Medicine

## 2009-10-05 ENCOUNTER — Encounter (INDEPENDENT_AMBULATORY_CARE_PROVIDER_SITE_OTHER): Payer: Self-pay | Admitting: Family Medicine

## 2009-11-05 ENCOUNTER — Encounter (INDEPENDENT_AMBULATORY_CARE_PROVIDER_SITE_OTHER): Payer: Self-pay | Admitting: Family Medicine

## 2010-08-09 ENCOUNTER — Ambulatory Visit (HOSPITAL_COMMUNITY): Admission: RE | Admit: 2010-08-09 | Discharge: 2010-08-09 | Payer: Self-pay | Admitting: General Surgery

## 2010-09-22 ENCOUNTER — Emergency Department (HOSPITAL_COMMUNITY): Admission: EM | Admit: 2010-09-22 | Discharge: 2010-09-22 | Payer: Self-pay | Admitting: Emergency Medicine

## 2010-12-29 ENCOUNTER — Encounter: Payer: Self-pay | Admitting: Family Medicine

## 2011-01-05 LAB — CONVERTED CEMR LAB
Albumin: 4.2 g/dL (ref 3.5–5.2)
Alkaline Phosphatase: 32 units/L — ABNORMAL LOW (ref 39–117)
CO2: 21 meq/L (ref 19–32)
Creatinine, Ser: 0.82 mg/dL (ref 0.40–1.20)
Glucose, Bld: 109 mg/dL — ABNORMAL HIGH (ref 70–99)
HDL goal, serum: 40 mg/dL
HDL: 56 mg/dL (ref >39)
LDL Cholesterol: 144 mg/dL — ABNORMAL HIGH (ref 0–99)
Potassium: 4.4 meq/L (ref 3.5–5.3)
Sodium: 136 meq/L (ref 135–145)
VLDL: 10 mg/dL (ref 0–40)

## 2011-02-19 LAB — CK TOTAL AND CKMB (NOT AT ARMC): CK, MB: 0.6 ng/mL (ref 0.3–4.0)

## 2011-02-19 LAB — TROPONIN I: Troponin I: 0.01 ng/mL (ref 0.00–0.06)

## 2011-02-20 LAB — HEPATIC FUNCTION PANEL
ALT: 21 U/L (ref 0–35)
AST: 21 U/L (ref 0–37)
Albumin: 3.6 g/dL (ref 3.5–5.2)
Bilirubin, Direct: 0.2 mg/dL (ref 0.0–0.3)
Indirect Bilirubin: 1.4 mg/dL — ABNORMAL HIGH (ref 0.3–0.9)
Total Protein: 6.8 g/dL (ref 6.0–8.3)

## 2011-02-20 LAB — CBC
MCV: 93.5 fL (ref 78.0–100.0)
Platelets: 288 10*3/uL (ref 150–400)
RBC: 4 MIL/uL (ref 3.87–5.11)
RDW: 12.9 % (ref 11.5–15.5)
WBC: 4.9 10*3/uL (ref 4.0–10.5)

## 2011-02-20 LAB — BASIC METABOLIC PANEL
BUN: 8 mg/dL (ref 6–23)
Chloride: 105 mEq/L (ref 96–112)
Creatinine, Ser: 0.74 mg/dL (ref 0.4–1.2)
GFR calc Af Amer: >60 mL/min (ref >60)
GFR calc non Af Amer: >60 mL/min (ref >60)

## 2011-02-20 LAB — SURGICAL PCR SCREEN: Staphylococcus aureus: NEGATIVE

## 2011-03-20 LAB — CBC
HCT: 39.4 % (ref 36.0–46.0)
Hemoglobin: 13.4 g/dL (ref 12.0–15.0)
MCHC: 34 g/dL (ref 30.0–36.0)
RDW: 12.5 % (ref 11.5–15.5)

## 2011-03-20 LAB — BLOOD GAS, ARTERIAL
Bicarbonate: 23.5 mEq/L (ref 20.0–24.0)
O2 Saturation: 95.9 %
Patient temperature: 37
TCO2: 21 mmol/L (ref 0–100)

## 2011-03-20 LAB — DIFFERENTIAL
Basophils Absolute: 0 10*3/uL (ref 0.0–0.1)
Eosinophils Relative: 1 % (ref 0–5)
Lymphocytes Relative: 16 % (ref 12–46)
Monocytes Absolute: 0.3 10*3/uL (ref 0.1–1.0)

## 2011-03-20 LAB — POCT I-STAT, CHEM 8
BUN: 8 mg/dL (ref 6–23)
Calcium, Ion: 1.13 mmol/L (ref 1.12–1.32)
Glucose, Bld: 112 mg/dL — ABNORMAL HIGH (ref 70–99)
TCO2: 23 mmol/L (ref 0–100)

## 2011-04-22 NOTE — Consult Note (Signed)
Kayla Choi, Kayla Choi             ACCOUNT NO.:  [id number]   MEDICAL RECORD NO.:  [id number]          PATIENT TYPE:  INP   LOCATION:  A210                          FACILITY:  [facility]   PHYSICIAN:  Gerrit Friends. Dietrich Pates, MD, FACCDATE OF BIRTH:  25-Jun-1966   DATE OF CONSULTATION:  05/11/2007  DATE OF DISCHARGE:  05/11/2007                                 CONSULTATION   HISTORY OF PRESENT ILLNESS:  A 45 year old woman admitted to hospital  after presenting to the emergency department with chest pain.  Ms.  Kozel has no history of cardiovascular disease.  She has never  previously been seen by a cardiologist nor undergone any significant  cardiac testing.  She describes a 5-day history of pressure in the left  chest of moderate severity that radiates to her arms.  There was no  cessation to exertion.  The exact timing is difficult to determine, but  the symptoms appear to be intermittent.  She feels worse when she is  supine.  There was associated dyspnea on exertion, nausea and emesis on  one occasion.  She has had paresthesias over this interval  intermittently in both her hands and her feet.  There was no radiation  of this discomfort.  The patient has had nothing that particularly  causes discomfort other than her body position and no way to relieve it  other than to change position.   PAST MEDICAL HISTORY:  1. Notable for hypertension that has been well-controlled with modest      medical therapy.  2. There is also a history of anxiety and depression.   MEDICATIONS ON ADMISSION:  1. HCTZ 25 mg daily.  2. Zoloft 50 mg daily.   ALLERGIES:  An allergy to ASPIRIN is reported.   SOCIAL HISTORY:  Engaged; two children, an 25 year old son and a 17-year-  old daughter.  No history of use of tobacco products nor alcohol.   FAMILY HISTORY:  Positive for hypertension; no coronary disease.   REVIEW OF SYSTEMS:  Unremarkable.   PHYSICAL EXAMINATION:  GENERAL APPEARANCE:  Pleasant, well  nourished  appearing woman in no acute distress.  VITAL SIGNS:  The heart rate is 90 and regular, blood pressure 120/70,  respirations 20, temperature 97.8.  HEENT:  Anicteric sclerae; normal lids and conjunctiva; normal oral  mucosa.  NECK:  No jugular venous distention; normal carotid upstrokes without  bruits.  LUNGS:  Clear.  CARDIAC:  Normal first and second heart sounds; modest systolic ejection  murmur.  ABDOMEN:  Slight left upper quadrant and left flank tenderness; normal  bowel sounds; no masses; no organomegaly.  EXTREMITIES:  Trace edema; distal pulses intact.  NEUROMUSCULAR:  Symmetric strength and tone; normal cranial nerves.  SKIN:  No significant lesions.   LABORATORY DATA:  Chest x-ray:  No significant abnormalities.  CBC shows  a slight anemia with a hemoglobin of 11.6 and a normal MCV.  Potassium  3.2 with normal renal function.  D-dimer and cardiac markers have been  negative.  EKGs have shown sinus rhythm, prominent voltage, borderline  left atrial abnormality and diffuse shallow T-wave inversions that are  nonspecific.  With correction of her hypokalemia, T-wave abnormalities  are less prominent.   IMPRESSION:  Ms. Terris presents with chest pain that is of quite  worrisome quality, but negative cardiac markers, nonspecific EKG changes  and a benign examination.  Due to her age and premenopausal status,  cardiovascular risk is quite low.  Lipid status is unknown and should be  assessed.  She has ruled out for myocardial infarction and can be  discharged to return for an outpatient stress nuclear study later this  week.  If she is to continue on diuretics for hypertension, a potassium  supplement will be needed.   We appreciate the opportunity to evaluate this nice woman and will be  happy to follow her after discharge.      Gerrit Friends. Dietrich Pates, MD, [facility]  Electronically Signed     RMR/MEDQ  D:  05/11/2007  T:  05/12/2007  Job:  [id number]

## 2011-04-22 NOTE — H&P (Signed)
NAMELEOTTA, WEINGARTEN             ACCOUNT NO.:  [id number]   MEDICAL RECORD NO.:  [id number]          PATIENT TYPE:  INP   LOCATION:  A210                          FACILITY:  [facility]   PHYSICIAN:  Margaretmary Dys, M.D.DATE OF BIRTH:  Apr 10, 1966   DATE OF ADMISSION:  05/10/2007  DATE OF DISCHARGE:  06/03/2008LH                              HISTORY & PHYSICAL   PRIMARY CARE PHYSICIAN:  Franchot Heidelberg, M.D.   ADMISSION DIAGNOSES:  1. Chest pain.  2. Hypokalemia.  3. History of panic disorders.  4. Abnormal EKG.  5. Mild obesity.  6. Hypokalemia.   CHIEF COMPLAINT:  Chest pain of one week duration.   HISTORY OF PRESENT ILLNESS:  [name] is a 45 year old, African-  American female who presented to the emergency room with a complaint of  chest pain. She describes the pain as retrosternal in the upper  epigastric area radiating to her left arm. The patient has also said her  fingers were numb and appeared to be contracting and hurt. The patient  said she felt like somebody was standing on her chest. She denies any  fevers or chills. She did feel some shortness of breath. The pain has  been waxing and waning.   She reports some nausea and vomiting.   The patient denies any prior episodes other than this week. Evaluation  in the emergency room was really unremarkable with negative cardiac  enzymes. EKG showed some nonspecific T wave changes versus T wave  inversion. As a result, the patient is being admitted now for further  evaluation by cardiology.   REVIEW OF SYSTEMS:  A 10-point review of systems otherwise negative  except as mentioned in the history of present illness.   PAST MEDICAL HISTORY:  1. Hypertension.  2. History of panic disorders.   MEDICATIONS:  1. Hydrochlorothiazide 25 mg p.o. once a day.  2. Zoloft 50 mg p.o. once a day.   ALLERGIES:  She reports allergies to ASPIRIN but she says it causes  itching of her throat but she does not break out in any  rashes or become  diaphoretic. I doubt that this is a true allergy.   SOCIAL HISTORY:  The patient is engaged, has 2 children, an 42 year old  son and a 77 year old daughter. She is a life long nonsmoker, does not  drink alcohol.   FAMILY HISTORY:  Positive for hypertension. The patient is not aware of  any coronary artery disease or diabetes.   PHYSICAL EXAMINATION:  GENERAL:  Conscious, alert, comfortable,  pleasant, not in acute distress.  VITAL SIGNS:  Blood pressure was 110/70, pulse was ranging between 90 to  114. Temperature 97.8. Oxygen saturation was 96% on room air,  respirations of 18.  HEENT:  Normocephalic, atraumatic. Oral mucosa was moist with no  exudates.  NECK:  Supple, no JVD or lymphadenopathy.  LUNGS:  Clear clinically with good air entry bilaterally.  HEART:  S1, S2 regular. No S3, S4, gallops or rub.  ABDOMEN:  Soft, nontender, bowel sounds were positive. No masses  palpable.  EXTREMITIES:  No pitting  edema, no calf induration or tenderness  was  noted.  CNS:  Grossly intact with no focal neurological deficits.   LABORATORY/DIAGNOSTIC DATA:  Chest x-ray done shows no acute  cardiopulmonary process. A 12-lead EKG shows normal sinus rhythm with  nonspecific ST-T changes and possibly mild T wave inversion in the  lateral lead.   White blood cell count was 7.8, hemoglobin of 11.6, hematocrit 34.8,  platelet count was 449 with no left shift. Sodium 131, potassium 3.2,  chloride of 97, CO2 26. Glucose was 169, BUN of 15, creatinine was 0.88.  Calcium was 8.6. Initial cardiac markers were negative. Urine drug  screen was negative.   ASSESSMENT/PLAN:  This is a 45 year old, African-American female who  presents to the emergency room with complaint of 5-day history of chest  pain. She seems to be describing fairly typical chest pain although  overall she has a pretty low cardiovascular risk.   The plan at this time is to admit her to 2A. We will put her on   telemetry. I am not convinced that this is typical angina so I will hold  off full anticoagulation or IV beta blocker therapy at this time. Will  continue to observe her and will give her analgesia p.r.n. Will obtain  cardiac enzymes q.8 x2 and will discuss with cardiology in a consult  tomorrow morning. Overall the patient remains stable. I think she will  remain observation status for now.      Margaretmary Dys, M.D.  Electronically Signed     AM/MEDQ  D:  05/11/2007  T:  05/11/2007  Job:  [id number]   cc:   Franchot Heidelberg, M.D.

## 2011-04-22 NOTE — H&P (Signed)
Kayla Choi, Kayla Choi             ACCOUNT NO.:  [id number]   MEDICAL RECORD NO.:  [id number]          PATIENT TYPE:  INP   LOCATION:  A326                          FACILITY:  [facility]   PHYSICIAN:  Tilda Burrow, M.D. DATE OF BIRTH:  [birth year]   DATE OF ADMISSION:  12/06/2007  DATE OF DISCHARGE:  LH                              HISTORY & PHYSICAL   ADMISSION DIAGNOSES:  Anemia, uterine menorrhagia, uterine fibroids.   HISTORY OF PRESENT ILLNESS:  This 45 year old female is referred by Dr.  Erby Pian to [facility] for assessment for symptomatic uterine  fibroids which result in very heavy periods, 7-10 days with chronic  anemia associated with the periods.  She has had Pap smear that was  normal, GC and Chlamydia cultures negative and desires definitive  solution to the fibroids.  Hysterectomy is planned.  Alternatives to the  care were discussed with the patient prior to deciding on this  management plan.  The patient, unfortunately, has hypertension which  reduces our options for hormone manipulation.   PAST MEDICAL HISTORY:  1. Hypertension.  2. Anxiety.  3. Depression.   PAST SURGICAL HISTORY:  1. Tubal ligation 18 years ago.  2. Injuries:  Broken knee many years ago.  3. She has had surgery to her left hand arm which was injured.   LABORATORY DATA:  Urinalysis negative.   PHYSICAL EXAMINATION:  HEENT:  Pupils equal, round and reactive.  Extraocular movements intact.  NECK:  Supple.  Trachea midline.  CHEST:  Clear to auscultation.  ABDOMEN:  Nontender with 12-14 week sized uterus.  No adnexal  tenderness.   PLAN:  Abdominal hysterectomy with preservation of ovaries.     Tilda Burrow, M.D.  Electronically Signed    JVF/MEDQ  D:  12/06/2007  T:  12/06/2007  Job:  [id number]   cc:   Franchot Heidelberg, M.D.

## 2011-04-22 NOTE — Discharge Summary (Signed)
NAMEROSELIA, SNIPE             ACCOUNT NO.:  [id number]   MEDICAL RECORD NO.:  [id number]          PATIENT TYPE:  INP   LOCATION:  A326                          FACILITY:  [facility]   PHYSICIAN:  Tilda Burrow, M.D. DATE OF BIRTH:  Jul 04, 1966   DATE OF ADMISSION:  12/06/2007  DATE OF DISCHARGE:  01/01/2009LH                               DISCHARGE SUMMARY   REFERRING PHYSICIAN:  Franchot Heidelberg, M.D.   ADMITTING DIAGNOSES:  1. Uterine fibroids.  2. Menorrhagia.  3. Anemia.   DISCHARGE DIAGNOSES:  1. Uterine fibroids.  2. Menorrhagia.  3. Anemia.  4. Anemia, post surgical.   PROCEDURE:  December 06, 2007, total abdominal hysterectomy.   DISCHARGE MEDICATIONS:  1. Percocet 5/325 one to two every four to six hours p.r.n. pain, #20.  2. Motrin p.r.n. minor discomfort.  3. Chromagen Forte one p.o. b.i.d. times 30 days for anemia.   HOSPITAL SUMMARY:  This 45 year old female referred from Dr. Erby Pian to  [facility] for symptomatic uterine fibroids with heavy bleeding  times seven to ten days per cycle resulting in chronic anemia was  admitted for abdominal hysterectomy, as described in the HPI.  She was  admitted with an admission hemoglobin of 11, hematocrit of 34, and  underwent hysterectomy notable for large fibroids and thickened  uterosacral ligaments, resulting in technically challenging surgery.  She had a tendency to ooze after the surgery.  She remained  hemodynamically stable throughout her recovery time but did run some  mild tachycardia during the first 24 hours, suggesting possible intra-  abdominal oozing.  CBC dropped slightly more than expected to a  hemoglobin of 8.4 and hematocrit of 25.5, then 7.8 and 23.7.  The IV was  discontinued at that time, and the hemoglobin and hematocrit stabilized  at 8.4 and 25.5%.  On the day of discharge white count was normal at  6000.  Bowel function return was slowed by the probable intra-abdominal  oozing, but  she was passing gas, with normal resumption of bowel  function, with normal vital signs and afebrile at the time of discharge.  She was given instructions for signs and symptoms of complications such  as fever, return of nausea, etc.  She will follow up in four days for  staple removal or earlier p.r.n. and by phone p.r.n. concerns.   ADDENDUM:  Pathology report shows a 581-gram uterus with multiple  fibroids, benign secretory endometrium, and no cervical abnormalities.      Tilda Burrow, M.D.  Electronically Signed     JVF/MEDQ  D:  12/09/2007  T:  12/09/2007  Job:  [id number]

## 2011-04-22 NOTE — Discharge Summary (Signed)
Kayla Choi, Kayla Choi             ACCOUNT NO.:  [id number]   MEDICAL RECORD NO.:  [id number]          PATIENT TYPE:  INP   LOCATION:  A210                          FACILITY:  [facility]   PHYSICIAN:  Skeet Latch, DO    DATE OF BIRTH:  06/01/1966   DATE OF ADMISSION:  05/10/2007  DATE OF DISCHARGE:  06/03/2008LH                               DISCHARGE SUMMARY   DISCHARGE DIAGNOSES:  1. Chest pain.  2. Hypokalemia.   HISTORY OF PRESENT ILLNESS:  This is a 45 year old African American  female who presented with a complaint of chest pain.  The patient stated  that the chest pain started last evening, got worse last evening but it  has been approximately 5 days.  The patient's course has been worsening.  She states that it is in the epigastric region and left side of her  chest.  It does not radiate.  She states that it was sharp in nature and  a pressure-like sensation.  The patient admits to some shortness of  breath, nausea, vomiting and some tingling.  The patient was seen in the  emergency room.   HOSPITAL COURSE:  The patient did have a chest x-ray which was normal.  The patient's blood work showed a white count of 7.8, a hemoglobin 11.6,  hematocrit 34.8, platelet count 449,000, D-dimer was 0.24 and sodium  131, potassium 3.2, glucose was 169.  CK-MB was less than 1.0, troponin  0.05.  Her EKG showed sinus rhythm, nonspecific T-wave changes.  The  patient had a cardiology consult and she was found to have a low  cardiovascular risk.  It was recommended the patient have an outpatient  stress test.  The patient's potassium was found to be 3.2 and was  supplemented.  Labs on discharge, sodium 138, potassium 3.6, chloride  103, CO2 27, glucose 112, BUN 12, creatinine 0.72.  The last troponin  was less than 0.05.  The patient states he has slight pain but is much  improved.  The patient is now ready for discharge.   MEDICATIONS ON DISCHARGE:  Secondary to the patient does not know  the  dose of medications and which pharmacy she uses, she will be sent home  on:  1. Hydrochlorothiazide 12.5 mg once a day.  2. Potassium chloride 10 mEq once a day.  3. Zoloft 50 mg once a day.   DIET:  The patient's diet, she will maintain a low-sodium heart-healthy  diet.   ACTIVITY:  Increase activity slowly.   FOLLOW-UP:  The patient will follow-up with Dr. Erby Pian in  approximately 1 week.   INSTRUCTIONS:  The patient is instructed to turn to emergency room for  any severe chest pain.  We will schedule an outpatient stress test for  the patient at this time.      Skeet Latch, DO  Electronically Signed     SM/MEDQ  D:  05/11/2007  T:  05/11/2007  Job:  [id number]   cc:   Franchot Heidelberg, M.D.

## 2011-04-22 NOTE — Op Note (Signed)
Kayla Choi, Kayla Choi             ACCOUNT NO.:  [id number]   MEDICAL RECORD NO.:  [id number]          PATIENT TYPE:  INP   LOCATION:  A326                          FACILITY:  [facility]   PHYSICIAN:  Tilda Burrow, M.D. DATE OF BIRTH:  22-Jul-1966   DATE OF PROCEDURE:  12/06/2007  DATE OF DISCHARGE:                               OPERATIVE REPORT   PREOPERATIVE DIAGNOSES:  Uterine fibroids, metrorrhagia and anemia.   POSTOPERATIVE DIAGNOSES:  Uterine fibroids, metrorrhagia and anemia.   PROCEDURE:  Total abdominal hysterectomy.   SURGEON:  Tilda Burrow, M.D.   ASSISTANT:  Morrie Sheldon, R.N.   ANESTHESIA:  General anesthesia.   COMPLICATIONS:  Perspiration droplets landing on instrument and wound  opening, converting the case to a contaminated case.   FINDINGS:  Large fibroid uterus, estimated to be a 400 gram uterus, with  posterior fundal fibroid and very thick uterosacral ligament complex.  No evidence of endometriosis.   DESCRIPTION OF PROCEDURE:  The patient was taken to the operating room  and prepped and draped in the usual fashion for a lower abdominal  surgery.  A Pfannenstiel-type incision was performed.  The uterus was  palpable above the symphysis pubis, estimated at 12 to 14-week size  prior to the incision.  An elliptical incision was performed in the  method of Pfannenstiel, excising approximately 3 cm of skin and fatty  tissue, to improve access and postoperative contouring.  The fascia was  easily opened in a smiling contour, and the peritoneal cavity entered in  the midline without difficulty.  The bowel was packed away.  The bowel  had been prepped prior to the procedure, and was relatively empty.  The  uterus was firm and round, and while the upper part of it was somewhat  mobile, the uterus could only rotate slightly.  It did not have mobility  to be pulled out of the incision.  A Balfour retractor was positioned  and tested, and it was felt that the blades  were fairly close to the  pelvic brim and uterine pelvic vessels, so after placing a dry towel  underneath the Balfour retractor, and finding the margin of spacing  unacceptable, we converted to using an Nurse, children's, and  positioned this, packing the bowel away with three laparotomy tapes.  The round ligaments were taken down on either side of the uterus,  rotated side to side until the round ligament could be taken down.  On  the right side, the utero-ovarian ligament was pulled high up over the  uterine fibroid.  It could be isolated, clamped, cut and suture ligated.  On the left side, the utero-ovarian ligament was much more mobile and  accessible.  Both were taken down, ligated, confirmed to be hemostatic.  The bladder flap was developed anteriorly and the uterine vessels on the  left side clamped with two Heaney clamps and a Kelly clamp for back-  bleeding.  It was transected and doubly ligated with #0 chromic.  This  was performed on the opposite side similarly.  A straight Heaney could  be clamped across the upper Cardinal  ligaments, taking down part of the  upper Cardinal ligament complex on each side, clamping, cutting and  suture ligating in a standard fashion with #0 chromic suture.  At this  point, a malleable could be placed behind the uterus and the large round  uterine body amputated off of the lower uterine segment which was then  grasped with a thyroid tenaculum with dramatically improved visibility.  We marched down the upper and lower Cardinal ligaments, serially  clamping, cutting and suture ligating.  Care was taken to ensure that  the bladder was well out of the way.  She had extremely thick  uterosacral ligaments without any fibrosis, so there was not a lot of  anterior mobility to the vaginal apex.  Once the level of the cervix was  reached, the cervix being quite large, approximately 4 cm in transverse  diameter, we were able to make a stab incision in  the anterior cervical  vaginal fornix and open the vaginal apex by amputating the cervix off of  the vaginal cuff, remaining particularly close to the cervix all the way  around.  An Aldridge stitch was placed at each lateral cervical vaginal  angle, to improve hemostasis.  There were some varicosities in the  anterior right side of the vaginal cuff that required individual  attention.  The cuff was closed using a series of interrupted and figure-  of-eight sutures of #0 chromic, with a slow ooze noted on the right side  just inside the Aldridge stitch, which had been tagged and held for  orientation.  An additional figure-of-eight was required in this area  and then hemostasis was eventually deemed satisfactory.   It should be noted that during the case, the technical challenges of the  case, after the uterus had been  amputated off the lower uterine  segment, there were two droplets of perspiration that dropped onto the  field, which converted to a contaminated case.  An effected laparotomy  tape was removed from the field, and gloves changed.  I put on a [employer]-  vented hood for the remainder of the case.  No further contamination was  suspected.  Copious irrigation was performed immediately afterward,  after the droplets of sweat fell, and then again at the end of the case.   Once hemostasis of the cuff was confirmed, the pedicles were inspected  and the laparotomy equipment removed.  The sponge counts were confirmed  as correct.  The anterior peritoneum was closed with running #2-0  chromic.  The fascia was closed with continuous running #0 Vicryl,  interrupting it in the midline.  The subcutaneous fatty tissues were  reapproximated using #2-0 plain x5 sutures and a staple closure of the  skin completed the procedure.  The irrigation of the subcutaneous fatty  tissue was again performed just before placing the subcutaneous sutures.  The sponge and needle counts were correct.    The patient went to the recovery room in stable condition.      Tilda Burrow, M.D.  Electronically Signed     JVF/MEDQ  D:  12/06/2007  T:  12/06/2007  Job:  [id number]   cc:   Franchot Heidelberg, M.D.

## 2011-04-25 NOTE — Op Note (Signed)
[place]. [facility]  Patient:    Kayla Choi, Kayla Choi                      MRN: [id number] Adm. Date:  [id number] Attending:  Ronne Binning                           Operative Report  NO DICTATION. DD:  12/04/00 TD:  12/04/00 Job: [id number]

## 2011-04-25 NOTE — Op Note (Signed)
[place]. [facility]  Patient:    Kayla Choi, Kayla Choi                      MRN: [id number] Proc. Date: 12/04/00 Adm. Date:  [id number] Attending:  Ronne Binning                           Operative Report  PREOPERATIVE DIAGNOSIS:  Injury radial collateral ligament, lateral epicondylitis right elbow.  POSTOPERATIVE DIAGNOSIS:  Injury radial collateral ligament, lateral epicondylitis right elbow.  OPERATION:  Reattachment at central origin, reconstruction of radial collateral ligament - right elbow with removal of exostosis and calcification from the ruptured collateral ligament.  SURGEON:  Nicki Reaper, M.D.  ASSISTANT:  Joaquin Courts, R.N.  ANESTHESIA:  Axillary block.  ANESTHESIOLOGIST:  Halford Decamp, M.D.  HISTORY:  The patient is a 45 year old female, who suffered an injury to her elbow.  She was treated for lateral epicondylitis.  Has developed a calcification.  MRI revealed this in the radial collateral ligament.  PROCEDURE:  The patient was brought to the operating room, where an axillary block was carried out without difficulty.  She was prepped and draped using Betadine scrub and solution with the right arm free.  The limb was exsanguinated with an Esmarch bandage, tourniquet placed high on the arm was inflated to 250 mmHg.  A curvilinear incision was made over the lateral epicondyle and carried down through subcutaneous tissue.  Bleeders were electrocauterized.  The extensor interval was opened.  The joint radial capitellar joint opened.  The fracture of the radial head revealed mild changes.  The collateral ligament was found to be entirely ruptured from its proximal attachment.  A large calcification was present within the ruptured ligament.  Prior to the procedure, a test for rotatory instability revealed no significant rotatory component, however, the joint was entirely unstable laterally.  The epicondyle musculature was then  elevated off the epicondyle. The bone calcification removed from the collateral ligament.  Dissection was then carried proximally.  The most lateral portion of the triceps tendon was then harvested, left attached to the olecranon.  This was then tunneled under the anconeus passed through the annular ligament and then sutured into the lateral epicondyle using an anchor.  The area of the lateral epicondyle was roughened with a rongeur.  This was done with a Bunnell type weave. It was then sutured to the remainder of the anconeus, which was left attached along with the posterior aspect of the fascia and Sharpeys fibers on the humerus. This firmly stabilized the lateral side of the elbow to any stress.  A second 3.5 Statak anchor was then inserted into the lateral epicondyle and the extensor origin was sutured back down onto the lateral epicondyle over the repair firmly maintaining it in position.  The area was copiously irrigated with saline prior to closure.  The remainder of the fascia was closed with figure-of-eight 4-0 Mersilene sutures.  The subcutaneous tissue was closed with interrupted 4-0 Vicryl and the skin with a 4-0 Monocryl suture. Steri-Strips were applied.  Sterile compressive dressing and long-arm splint applied.  The patient tolerated the procedure well and was taken to the recovery room for observation in satisfactory condition.  She was discharged home to return to the [facility] [place] in one week on Percocet and Keflex. DD:  12/04/00 TD:  12/04/00 Job: [id number]

## 2011-04-25 NOTE — Procedures (Signed)
Kayla Choi, Kayla Choi             ACCOUNT NO.:  [id number]   MEDICAL RECORD NO.:  [id number]         PATIENT TYPE:  POUT   LOCATION:  RESP                          FACILITY:  [facility]   PHYSICIAN:  Edward L. Juanetta Gosling, M.D.DATE OF BIRTH:  03/29/1966   DATE OF PROCEDURE:  DATE OF DISCHARGE:                            PULMONARY FUNCTION TEST   1. Spirometry shows a mild ventilatory defect with airflow obstruction      most marked in the smaller airways.  2. Lung volumes are normal.  3. DLCO is normal.  4. Arterial blood gas is normal.      Edward L. Juanetta Gosling, M.D.  Electronically Signed     ELH/MEDQ  D:  03/09/2009  T:  03/09/2009  Job:  [id number]

## 2011-08-19 ENCOUNTER — Ambulatory Visit: Payer: Self-pay | Admitting: Family Medicine

## 2011-08-27 LAB — DIFFERENTIAL
Basophils Absolute: 0
Basophils Relative: 1
Eosinophils Absolute: 0.1
Lymphs Abs: 1.6
Neutrophils Relative %: 61

## 2011-08-27 LAB — CBC
MCHC: 32.9
MCV: 79.8
Platelets: 334
RDW: 17.5 — ABNORMAL HIGH
WBC: 6

## 2011-09-03 ENCOUNTER — Ambulatory Visit (INDEPENDENT_AMBULATORY_CARE_PROVIDER_SITE_OTHER): Payer: Medicaid Other | Admitting: Family Medicine

## 2011-09-03 ENCOUNTER — Encounter: Payer: Self-pay | Admitting: Family Medicine

## 2011-09-03 VITALS — BP 130/96 | HR 91 | Resp 16 | Ht 62.5 in | Wt 191.4 lb

## 2011-09-03 DIAGNOSIS — F411 Generalized anxiety disorder: Secondary | ICD-10-CM

## 2011-09-03 DIAGNOSIS — I10 Essential (primary) hypertension: Secondary | ICD-10-CM

## 2011-09-03 DIAGNOSIS — R7303 Prediabetes: Secondary | ICD-10-CM

## 2011-09-03 DIAGNOSIS — J449 Chronic obstructive pulmonary disease, unspecified: Secondary | ICD-10-CM

## 2011-09-03 DIAGNOSIS — E785 Hyperlipidemia, unspecified: Secondary | ICD-10-CM

## 2011-09-03 DIAGNOSIS — R7309 Other abnormal glucose: Secondary | ICD-10-CM

## 2011-09-03 DIAGNOSIS — E669 Obesity, unspecified: Secondary | ICD-10-CM

## 2011-09-03 DIAGNOSIS — L910 Hypertrophic scar: Secondary | ICD-10-CM

## 2011-09-03 LAB — COMPREHENSIVE METABOLIC PANEL
ALT: 22 U/L (ref 0–35)
AST: 17 U/L (ref 0–37)
Albumin: 4.2 g/dL (ref 3.5–5.2)
Alkaline Phosphatase: 37 U/L — ABNORMAL LOW (ref 39–117)
Potassium: 4.6 mEq/L (ref 3.5–5.3)
Sodium: 139 mEq/L (ref 135–145)
Total Bilirubin: 1.2 mg/dL (ref 0.3–1.2)
Total Protein: 7.4 g/dL (ref 6.0–8.3)

## 2011-09-03 MED ORDER — TRIAMCINOLONE ACETONIDE 0.5 % EX OINT: TOPICAL_OINTMENT | Freq: Two times a day (BID) | CUTANEOUS | Status: AC

## 2011-09-03 NOTE — Patient Instructions (Addendum)
Continue your current medications Use the steroid cream twice a day for itching on the scars only I will send a referral to dermatology Get your blood work done today, we will call with results  I recommend at least 30 minutes of exercise 3-5 times a week Next visit at the end of January
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
## 2011-09-04 ENCOUNTER — Telehealth: Payer: Self-pay | Admitting: *Deleted

## 2011-09-04 NOTE — Assessment & Plan Note (Signed)
Review records, continue inhaler

## 2011-09-04 NOTE — Assessment & Plan Note (Signed)
Check FLP 

## 2011-09-04 NOTE — Assessment & Plan Note (Signed)
Continue current meds, pt anxious today, check labs

## 2011-09-04 NOTE — Assessment & Plan Note (Signed)
Encouraged weight loss, will discuss more specifics in future

## 2011-09-04 NOTE — Telephone Encounter (Signed)
Called patient, left message.

## 2011-09-04 NOTE — Telephone Encounter (Signed)
Message copied by Diamantina Monks on Thu Sep 04, 2011  3:07 PM ------      Message from: Milinda Antis F      Created: Thu Sep 04, 2011  1:16 PM       Please let Ms. Laswon know her lab results:      A1C was 6.1% this is till pre-diabetes, she needs to watch her sugar intake, and foods with high carbs- such as bread, pasta, fried foods      Her kidney function was normal and her liver function was normal.      Her bad cholesterol was 122, which is a little high. I want her to watch the foods per above and the fats.      I will recheck her diabetes labs and her cholesterol at our next visit. She will not be given any new meds at this time

## 2011-09-04 NOTE — Assessment & Plan Note (Signed)
Pt appears to have moderate anxiety levels, will follow with her psychiatrist

## 2011-09-04 NOTE — Assessment & Plan Note (Signed)
Check A1C 

## 2011-09-04 NOTE — Assessment & Plan Note (Signed)
Topical TAC, refer to dermatology for intralesional injections

## 2011-09-06 ENCOUNTER — Telehealth: Payer: Self-pay | Admitting: *Deleted

## 2011-09-06 NOTE — Telephone Encounter (Signed)
Patient aware.

## 2011-09-06 NOTE — Telephone Encounter (Signed)
Message copied by Diamantina Monks on Sat Sep 06, 2011  2:12 PM ------      Message from: Milinda Antis F      Created: Thu Sep 04, 2011  1:16 PM       Please let Kayla Choi know her lab results:      A1C was 6.1% this is till pre-diabetes, she needs to watch her sugar intake, and foods with high carbs- such as bread, pasta, fried foods      Her kidney function was normal and her liver function was normal.      Her bad cholesterol was 122, which is a little high. I want her to watch the foods per above and the fats.      I will recheck her diabetes labs and her cholesterol at our next visit. She will not be given any new meds at this time

## 2011-09-06 NOTE — Telephone Encounter (Signed)
Patient aware of lab results.

## 2011-09-12 LAB — CBC
HCT: 23.7 — ABNORMAL LOW
HCT: 27.9 — ABNORMAL LOW
HCT: 34 — ABNORMAL LOW
Hemoglobin: 11 — ABNORMAL LOW
Hemoglobin: 7.8 — CL
MCHC: 32.1
MCHC: 32.5
MCHC: 33
MCV: 80.3
MCV: 80.4
Platelets: 358
Platelets: 397
RBC: 3.41 — ABNORMAL LOW
RBC: 4.31
RDW: 16.2 — ABNORMAL HIGH
RDW: 16.7 — ABNORMAL HIGH
RDW: 16.8 — ABNORMAL HIGH
RDW: 17.5 — ABNORMAL HIGH

## 2011-09-12 LAB — COMPREHENSIVE METABOLIC PANEL
ALT: 15
Alkaline Phosphatase: 30 — ABNORMAL LOW
BUN: 10
CO2: 22
Calcium: 8.5
GFR calc non Af Amer: >60
Glucose, Bld: 93
Total Protein: 6.9

## 2011-09-12 LAB — DIFFERENTIAL
Basophils Absolute: 0
Basophils Absolute: 0
Basophils Relative: 1
Eosinophils Absolute: 0.1
Eosinophils Relative: 1
Eosinophils Relative: 1
Lymphocytes Relative: 15
Lymphocytes Relative: 22
Lymphs Abs: 0.9
Monocytes Absolute: 0.7
Monocytes Relative: 8
Neutro Abs: 10.2 — ABNORMAL HIGH
Neutrophils Relative %: 84 — ABNORMAL HIGH

## 2011-09-12 LAB — BASIC METABOLIC PANEL
CO2: 26
Chloride: 101
Glucose, Bld: 166 — ABNORMAL HIGH
Potassium: 4
Sodium: 131 — ABNORMAL LOW

## 2011-09-12 LAB — CROSSMATCH
ABO/RH(D): O POS
Antibody Screen: NEGATIVE

## 2011-09-25 LAB — BASIC METABOLIC PANEL
Calcium: 8.5
Calcium: 8.6
Chloride: 97
Creatinine, Ser: 0.72
Creatinine, Ser: 0.88
GFR calc Af Amer: >60
GFR calc Af Amer: >60
GFR calc non Af Amer: >60
GFR calc non Af Amer: >60
Glucose, Bld: 112 — ABNORMAL HIGH
Sodium: 138

## 2011-09-25 LAB — TSH: TSH: 4.031

## 2011-09-25 LAB — CBC
Hemoglobin: 11 — ABNORMAL LOW
MCHC: 33.4
MCV: 81.3
RBC: 4.32
RDW: 15.5 — ABNORMAL HIGH
WBC: 7.8

## 2011-09-25 LAB — DIFFERENTIAL
Basophils Absolute: 0.1
Basophils Relative: 1
Eosinophils Absolute: 0.1
Lymphocytes Relative: 28
Lymphs Abs: 2.2
Monocytes Absolute: 0.7
Monocytes Relative: 10
Monocytes Relative: 8
Neutro Abs: 3.7
Neutro Abs: 4.8
Neutrophils Relative %: 61

## 2011-09-25 LAB — POCT CARDIAC MARKERS
Myoglobin, poc: 44.3
Operator id: 106841
Operator id: 215201
Troponin i, poc: <0.05

## 2011-09-25 LAB — LIPID PANEL
Triglycerides: 55
VLDL: 11

## 2011-09-25 LAB — CARDIAC PANEL(CRET KIN+CKTOT+MB+TROPI)
CK, MB: 0.8
Relative Index: INVALID
Relative Index: INVALID
Total CK: 94
Total CK: 96
Troponin I: 0.02

## 2011-09-25 LAB — RAPID URINE DRUG SCREEN, HOSP PERFORMED
Amphetamines: NOT DETECTED
Benzodiazepines: NOT DETECTED
Cocaine: NOT DETECTED

## 2012-01-05 ENCOUNTER — Ambulatory Visit: Payer: Medicaid Other | Admitting: Family Medicine

## 2012-01-06 ENCOUNTER — Ambulatory Visit (INDEPENDENT_AMBULATORY_CARE_PROVIDER_SITE_OTHER): Payer: Medicaid Other | Admitting: Family Medicine

## 2012-01-06 ENCOUNTER — Encounter: Payer: Self-pay | Admitting: Family Medicine

## 2012-01-06 VITALS — BP 124/82 | HR 83 | Resp 18 | Ht 62.5 in | Wt 194.1 lb

## 2012-01-06 DIAGNOSIS — F411 Generalized anxiety disorder: Secondary | ICD-10-CM

## 2012-01-06 DIAGNOSIS — R7303 Prediabetes: Secondary | ICD-10-CM

## 2012-01-06 DIAGNOSIS — E669 Obesity, unspecified: Secondary | ICD-10-CM

## 2012-01-06 DIAGNOSIS — J449 Chronic obstructive pulmonary disease, unspecified: Secondary | ICD-10-CM

## 2012-01-06 DIAGNOSIS — E785 Hyperlipidemia, unspecified: Secondary | ICD-10-CM

## 2012-01-06 DIAGNOSIS — L910 Hypertrophic scar: Secondary | ICD-10-CM

## 2012-01-06 DIAGNOSIS — R7309 Other abnormal glucose: Secondary | ICD-10-CM

## 2012-01-06 DIAGNOSIS — J4489 Other specified chronic obstructive pulmonary disease: Secondary | ICD-10-CM

## 2012-01-06 DIAGNOSIS — I10 Essential (primary) hypertension: Secondary | ICD-10-CM

## 2012-01-06 MED ORDER — IBUPROFEN 600 MG PO TABS: 600.0000 mg | ORAL_TABLET | Freq: Four times a day (QID) | ORAL | Status: DC | PRN

## 2012-01-06 MED ORDER — HYDROCHLOROTHIAZIDE 25 MG PO TABS: 25.0000 mg | ORAL_TABLET | Freq: Every day | ORAL | Status: DC

## 2012-01-06 NOTE — Assessment & Plan Note (Signed)
Blood pressure at goal no change in medication 

## 2012-01-06 NOTE — Assessment & Plan Note (Signed)
Obtain A1C. 

## 2012-01-06 NOTE — Assessment & Plan Note (Signed)
Will refer to dermatology again for intralesional injections. Topical cream did not help

## 2012-01-06 NOTE — Progress Notes (Signed)
  Subjective:    Patient ID: Kayla Choi, female    DOB: 01/14/1966, 46 y.o.   MRN: [id number]  HPI  Pt here for routine follow-up:  HA- past 2 weeks has had headache on and off, they start in front of face and end in her neck, she often lays down until they resolve, she states they are brought on by stress from her adult children, when she is away from them or away from home feels fine  Keloid- was not able to get into derm, continues to have severe itching  HTN- tolerating HCTZ, has some ankle swelling  Pre-diabetes- has cut out soda, not exercising routinely, wants to loose weight. Needs labs  COPD- breathing is okay during the day,or when she is not at home. She thinks there is mold in her home. At nights wakes up like she can not breath, snores a lot. Has had a sleep study and some work-up for this previously which has been negative.   Review of Systems  GEN- denies fatigue, fever, weight loss,weakness, recent illness HEENT- denies eye drainage, change in vision, nasal discharge, CVS- denies chest pain, palpitations, +leg edema RESP- denies SOB, cough, wheeze ABD- denies N/V, change in stools, abd pain GU- denies dysuria, hematuria, dribbling, incontinence MSK- denies joint pain, muscle aches, injury Neuro- +headache, denies dizziness, syncope, seizure activity      Objective:   Physical Exam GEN- NAD, alert and oriented x3 HEENT- PERRL, EOMI, non injected sclera, pink conjunctiva, MMM, oropharynx clear, fundoscopic exan benign Neck- Supple, no thyromegaly CVS- RRR, no murmur RESP-CTAB EXT- No edema Pulses- Radial, DP- 2+ Psych- flat affect, not depressed appearing, not anxious appearing Skin- multiple large keloids on abdomen and large one on bikini line       Assessment & Plan:

## 2012-01-06 NOTE — Assessment & Plan Note (Signed)
Patient's social phobia and anxiety causes her a lot of stress in her life. She continues to follow with mental health. No change in medications with except she is no longer taking Ritalin She does not sleep well per report however does not want to take any other new medication

## 2012-01-06 NOTE — Assessment & Plan Note (Signed)
She appears to be her baseline regarding her COPD. She is very convinced that her house has multiple problems and she is in the process of trying to move. I will obtain a sleep study as she is describing more sleep apnea with his snoring and waking up gasping for breath. No hypoxia noted today

## 2012-01-06 NOTE — Patient Instructions (Addendum)
I will refer you to a new dermatologist For your headache this is tension- use the ibuprofen 600mg  We will call you with the lab results Continue your current medications F/U in 3 months-for a physical

## 2012-01-06 NOTE — Assessment & Plan Note (Signed)
Will check direct LDL. Patient is to continue to monitor her diet low-fat/ow carb

## 2012-01-06 NOTE — Assessment & Plan Note (Signed)
Discussed importance of exercise and change in diet to improve overall health

## 2012-01-08 LAB — HEMOGLOBIN A1C
Hgb A1c MFr Bld: 6.6 % — ABNORMAL HIGH (ref <5.7)
Mean Plasma Glucose: 143 mg/dL — ABNORMAL HIGH (ref <117)

## 2012-01-08 LAB — LDL CHOLESTEROL, DIRECT: Direct LDL: 126 mg/dL — ABNORMAL HIGH

## 2012-01-08 LAB — BASIC METABOLIC PANEL
BUN: 9 mg/dL (ref 6–23)
Calcium: 9 mg/dL (ref 8.4–10.5)
Creat: 0.87 mg/dL (ref 0.50–1.10)

## 2012-01-12 ENCOUNTER — Telehealth: Payer: Self-pay | Admitting: Family Medicine

## 2012-01-12 MED ORDER — METFORMIN HCL ER 500 MG PO TB24: 500.0000 mg | ORAL_TABLET | Freq: Every day | ORAL | Status: DC

## 2012-01-12 NOTE — Telephone Encounter (Signed)
I called patient and left a message. She was previously prediabetic. Now she has tipped over and she is actually diabetic. Will start metformin 500 mg once a day with meals. She does not she does not have to check her blood sugar right now. Her kidney function is normal. Her cholesterol is high. I want her to work on a low fat diet. I'll recheck her cholesterol and her diabetes at her visit in 3 months. If her cholesterol does not improve she will need additional cholesterol medication as well.

## 2012-01-12 NOTE — Telephone Encounter (Signed)
Pt given lab results, start Metformin

## 2012-01-13 NOTE — Telephone Encounter (Signed)
Patient aware.

## 2012-01-13 NOTE — Telephone Encounter (Signed)
Called pt left message .

## 2012-02-11 ENCOUNTER — Other Ambulatory Visit: Payer: Self-pay | Admitting: Allergy and Immunology

## 2012-02-11 ENCOUNTER — Ambulatory Visit
Admission: RE | Admit: 2012-02-11 | Discharge: 2012-02-11 | Disposition: A | Discharge status: Home / Self Care | Payer: Medicaid Other | Source: Ambulatory Visit | Attending: Allergy and Immunology | Admitting: Allergy and Immunology

## 2012-02-11 DIAGNOSIS — J45909 Unspecified asthma, uncomplicated: Secondary | ICD-10-CM

## 2012-02-19 ENCOUNTER — Telehealth: Payer: Self-pay | Admitting: Family Medicine

## 2012-02-19 NOTE — Telephone Encounter (Signed)
When did she start the medication, by my prescription she should have been on this for about 6 weeks now. If she recently started within the past week, then tell her to stop the medication and take benadryl for the swelling. She should have an appt with me soon

## 2012-02-19 NOTE — Telephone Encounter (Signed)
Called and left message to see what she needs to speak with the dr about.

## 2012-02-19 NOTE — Telephone Encounter (Signed)
Pt returned call and stated that she has had some facial swelling since she started her "diabetic" medicine and would like to know if the is normal or if the medicine should be changed.  Has already taken the dose for today.

## 2012-02-20 MED ORDER — GLIPIZIDE 5 MG PO TABS: 5.0000 mg | ORAL_TABLET | Freq: Every day | ORAL | Status: DC

## 2012-02-20 NOTE — Telephone Encounter (Signed)
Pt aware to stop metformin and she states that her next appointment isn't until 4/26

## 2012-02-20 NOTE — Telephone Encounter (Signed)
Pt to start Glipizide once a day

## 2012-02-20 NOTE — Telephone Encounter (Signed)
Pt aware.

## 2012-04-02 ENCOUNTER — Ambulatory Visit (INDEPENDENT_AMBULATORY_CARE_PROVIDER_SITE_OTHER): Payer: Medicaid Other | Admitting: Family Medicine

## 2012-04-02 ENCOUNTER — Other Ambulatory Visit (HOSPITAL_COMMUNITY)
Admission: RE | Admit: 2012-04-02 | Discharge: 2012-04-02 | Disposition: A | Discharge status: Home / Self Care | Payer: Medicaid Other | Source: Ambulatory Visit | Attending: Family Medicine | Admitting: Family Medicine

## 2012-04-02 ENCOUNTER — Encounter: Payer: Self-pay | Admitting: Family Medicine

## 2012-04-02 DIAGNOSIS — Z0000 Encounter for general adult medical examination without abnormal findings: Secondary | ICD-10-CM

## 2012-04-02 DIAGNOSIS — L910 Hypertrophic scar: Secondary | ICD-10-CM

## 2012-04-02 DIAGNOSIS — I10 Essential (primary) hypertension: Secondary | ICD-10-CM

## 2012-04-02 DIAGNOSIS — E785 Hyperlipidemia, unspecified: Secondary | ICD-10-CM

## 2012-04-02 DIAGNOSIS — Z01419 Encounter for gynecological examination (general) (routine) without abnormal findings: Secondary | ICD-10-CM | POA: Insufficient documentation

## 2012-04-02 DIAGNOSIS — E669 Obesity, unspecified: Secondary | ICD-10-CM

## 2012-04-02 DIAGNOSIS — E119 Type 2 diabetes mellitus without complications: Secondary | ICD-10-CM | POA: Insufficient documentation

## 2012-04-02 DIAGNOSIS — R229 Localized swelling, mass and lump, unspecified: Secondary | ICD-10-CM

## 2012-04-02 DIAGNOSIS — Z1321 Encounter for screening for nutritional disorder: Secondary | ICD-10-CM

## 2012-04-02 LAB — BASIC METABOLIC PANEL
Calcium: 9.2 mg/dL (ref 8.4–10.5)
Potassium: 4.6 mEq/L (ref 3.5–5.3)
Sodium: 139 mEq/L (ref 135–145)

## 2012-04-02 LAB — HEMOGLOBIN A1C: Mean Plasma Glucose: 128 mg/dL — ABNORMAL HIGH (ref <117)

## 2012-04-02 LAB — CBC
MCH: 31.1 pg (ref 26.0–34.0)
MCHC: 32.6 g/dL (ref 30.0–36.0)
Platelets: 355 10*3/uL (ref 150–400)
RBC: 4.54 MIL/uL (ref 3.87–5.11)

## 2012-04-02 LAB — LIPID PANEL
Total CHOL/HDL Ratio: 4.1 Ratio
VLDL: 15 mg/dL (ref 0–40)

## 2012-04-02 LAB — POC HEMOCCULT BLD/STL (OFFICE/1-CARD/DIAGNOSTIC): Fecal Occult Blood, POC: NEGATIVE

## 2012-04-02 MED ORDER — CEPHALEXIN 500 MG PO CAPS: 500.0000 mg | ORAL_CAPSULE | Freq: Two times a day (BID) | ORAL | Status: AC

## 2012-04-02 NOTE — Assessment & Plan Note (Addendum)
I do not think eye twich is secondary to Glipizide, it occurs randomly , and typically when she is driving or stressed. Unsure if metformin was a true allergy however she did describe facial swelling   Labs to be done Urine Micro

## 2012-04-02 NOTE — Progress Notes (Signed)
Addended by: Kandis Fantasia B on: 04/02/2012 02:41 PM   Modules accepted: Orders

## 2012-04-02 NOTE — Assessment & Plan Note (Signed)
Continue to encourage any activity and exercise.

## 2012-04-02 NOTE — Patient Instructions (Addendum)
We will send a letter with your PAP smear results Get the labs done fasting Continue current meds Continue to work on exercise goal of 30 minutes  Days a week Schedule Mammogram for Mid May  Take antibiotic for boil- warm compresses I will refer you to surgeon F/U 3 months

## 2012-04-02 NOTE — Assessment & Plan Note (Signed)
This looks like a benign growth on her inner thigh, she states it does rub and has been growing, at advise removal, will send to general surgery

## 2012-04-02 NOTE — Progress Notes (Signed)
  Subjective:    Patient ID: Kayla Choi, female    DOB: Sep 05, 1966, 46 y.o.   MRN: [id number]  HPI  Pt here for GYN exam, TDAP- declined secondary to cost Mammogram to be scheduled Medications reviewed Dm- has been taking glipizide, but occ eye twitches, no change in vision, no swelling  Felll and twisted ankle a few weeks ago outside of home, has some mild swelling  Has a knot under right armpit   Review of Systems  GEN- denies fatigue, fever, weight loss,weakness, recent illness HEENT- denies eye drainage, change in vision, nasal discharge, CVS- denies chest pain, palpitations RESP- denies SOB, cough, wheeze ABD- denies N/V, change in stools, abd pain GU- denies dysuria, hematuria, dribbling, incontinence MSK- denies joint pain, muscle aches, injury Neuro- denies headache, dizziness, syncope, seizure activity       Objective:   Physical Exam GEN- NAD, alert and oriented, Neck- supple, no thyromegaly HEENT-PERRL,EOMI, MMM,TM clear bilat, oropharynx clear, fair dentition CVS-RRR, no murmur RESP-CTAB ABD-NABS,soft, NT ,D  Breast- normal symmetry, no nipple inversion,no nipple drainage, no nodules or lumps felt Nodes- no axillary nodes GU- normal external genitalia, vaginal mucosa pink and moist,no cervix visualized, no ovarian masses,  Skin- small boil right axillary, large fleshy pedunculated mass on right inner thigh , no erythema, no fluctuance, NT  FOBT- neg Rectum- normal tone  Ext- mild swelling of right ankle , normal ROM     Assessment & Plan:    CPE- If PAP done on vaginal cuff neg, no further PAP Smears needed         Mammo to be done          Labs to be done

## 2012-04-02 NOTE — Assessment & Plan Note (Signed)
No change to meds, At goal

## 2012-04-03 LAB — MICROALBUMIN / CREATININE URINE RATIO
Microalb Creat Ratio: 40.6 mg/g — ABNORMAL HIGH (ref 0.0–30.0)
Microalb, Ur: 10 mg/dL — ABNORMAL HIGH (ref 0.00–1.89)

## 2012-04-04 LAB — VITAMIN D 1,25 DIHYDROXY
Vitamin D 1, 25 (OH)2 Total: 31 pg/mL (ref 18–72)
Vitamin D2 1, 25 (OH)2: <8 pg/mL
Vitamin D3 1, 25 (OH)2: 31 pg/mL

## 2012-04-06 MED ORDER — LISINOPRIL 2.5 MG PO TABS: 2.5000 mg | ORAL_TABLET | Freq: Every day | ORAL | Status: DC

## 2012-04-06 MED ORDER — SIMVASTATIN 10 MG PO TABS: 10.0000 mg | ORAL_TABLET | Freq: Every evening | ORAL | Status: DC

## 2012-04-06 NOTE — Progress Notes (Signed)
Addended by: Milinda Antis F on: 04/06/2012 05:10 PM   Modules accepted: Orders

## 2012-04-15 ENCOUNTER — Encounter (HOSPITAL_COMMUNITY): Payer: Self-pay | Admitting: Pharmacy Technician

## 2012-04-16 ENCOUNTER — Other Ambulatory Visit: Payer: Self-pay

## 2012-04-16 ENCOUNTER — Encounter (HOSPITAL_COMMUNITY)
Admission: RE | Admit: 2012-04-16 | Discharge: 2012-04-16 | Disposition: A | Discharge status: Home / Self Care | Payer: Medicaid Other | Source: Ambulatory Visit | Attending: General Surgery | Admitting: General Surgery

## 2012-04-16 ENCOUNTER — Encounter (HOSPITAL_COMMUNITY): Payer: Self-pay

## 2012-04-16 ENCOUNTER — Encounter (HOSPITAL_COMMUNITY): Payer: Self-pay | Admitting: Pharmacy Technician

## 2012-04-16 LAB — DIFFERENTIAL
Basophils Absolute: 0 10*3/uL (ref 0.0–0.1)
Basophils Relative: 1 % (ref 0–1)
Eosinophils Absolute: 0 10*3/uL (ref 0.0–0.7)
Lymphs Abs: 1.9 10*3/uL (ref 0.7–4.0)
Neutrophils Relative %: 61 % (ref 43–77)

## 2012-04-16 LAB — BASIC METABOLIC PANEL
Calcium: 9.7 mg/dL (ref 8.4–10.5)
Creatinine, Ser: 0.72 mg/dL (ref 0.50–1.10)
GFR calc non Af Amer: >90 mL/min (ref >90)
Glucose, Bld: 127 mg/dL — ABNORMAL HIGH (ref 70–99)
Sodium: 137 mEq/L (ref 135–145)

## 2012-04-16 LAB — CBC
MCH: 31 pg (ref 26.0–34.0)
MCHC: 33.2 g/dL (ref 30.0–36.0)
Platelets: 414 10*3/uL — ABNORMAL HIGH (ref 150–400)
RBC: 4.39 MIL/uL (ref 3.87–5.11)
RDW: 12.5 % (ref 11.5–15.5)

## 2012-04-16 NOTE — Patient Instructions (Addendum)
20 Kayla Choi  04/16/2012   Your procedure is scheduled on:  04/23/2012  Report to [facility] at  615 AM.  Call this number if you have problems the morning of surgery: [phone]   Remember:   Do not eat food:After Midnight.  May have clear liquids:until Midnight .    Take these medicines the morning of surgery with A SIP OF WATER:  Hydrodiuril,zestril,claritin,ativan,zolft,wellbutrin. Take symbicort,spiriva and flonase before you come.   Do not wear jewelry, make-up or nail polish.  Do not wear lotions, powders, or perfumes. You may wear deodorant.  Do not shave 48 hours prior to surgery.  Do not bring valuables to the hospital.  Contacts, dentures or bridgework may not be worn into surgery.  Leave suitcase in the car. After surgery it may be brought to your room.  For patients admitted to the hospital, checkout time is 11:00 AM the day of discharge.   Patients discharged the day of surgery will not be allowed to drive home.  Name and phone number of your driver: family  Special Instructions: CHG Shower Use Special Wash: 1/2 bottle night before surgery and 1/2 bottle morning of surgery.   Please read over the following fact sheets that you were given: Pain Booklet, MRSA Information, Surgical Site Infection Prevention, Anesthesia Post-op Instructions and Care and Recovery After Surgery Lipoma A lipoma is a noncancerous (benign) tumor composed of fat cells. They are usually found under the skin (subcutaneous). A lipoma may occur in any tissue of the body that contains fat. Common areas for lipomas to appear include the back, shoulders, buttocks, and thighs. Lipomas are a very common soft tissue growth. They are soft and grow slowly. Most problems caused by a lipoma depend on where it is growing. DIAGNOSIS  A lipoma can be diagnosed with a physical exam. These tumors rarely become cancerous, but radiographic studies can help determine this for certain. Studies used may  include:  Computerized X-ray scans (CT or CAT scan).   Computerized magnetic scans (MRI).  TREATMENT  Small lipomas that are not causing problems may be watched. If a lipoma continues to enlarge or causes problems, removal is often the best treatment. Lipomas can also be removed to improve appearance. Surgery is done to remove the fatty cells and the surrounding capsule. Most often, this is done with medicine that numbs the area (local anesthetic). The removed tissue is examined under a microscope to make sure it is not cancerous. Keep all follow-up appointments with your caregiver. SEEK MEDICAL CARE IF:   The lipoma becomes larger or hard.   The lipoma becomes painful, red, or increasingly swollen. These could be signs of infection or a more serious condition.  Document Released: 11/14/2002 Document Revised: 11/13/2011 Document Reviewed: 04/26/2010 [facility] Patient Information 2012 [place], [place].PATIENT INSTRUCTIONS POST-ANESTHESIA  IMMEDIATELY FOLLOWING SURGERY:  Do not drive or operate machinery for the first twenty four hours after surgery.  Do not make any important decisions for twenty four hours after surgery or while taking narcotic pain medications or sedatives.  If you develop intractable nausea and vomiting or a severe headache please notify your doctor immediately.  FOLLOW-UP:  Please make an appointment with your surgeon as instructed. You do not need to follow up with anesthesia unless specifically instructed to do so.  WOUND CARE INSTRUCTIONS (if applicable):  Keep a dry clean dressing on the anesthesia/puncture wound site if there is drainage.  Once the wound has quit draining you may leave it open to air.  Generally you should leave the bandage intact for twenty four hours unless there is drainage.  If the epidural site drains for more than 36-48 hours please call the anesthesia department.  QUESTIONS?:  Please feel free to call your physician or the hospital operator if you  have any questions, and they will be happy to assist you.     [facility] Anesthesia Department [address] [place] [phone]

## 2012-04-16 NOTE — H&P (Signed)
  NTS SOAP Note  Vital Signs:  Vitals as of: 04/06/2012: Systolic 146: Diastolic 93: Heart Rate 78: Temp 97.67F: Height 28ft 2in: Weight 197Lbs 0 Ounces: OFC Not Entered: Respiratory Rate Not Entered: O2 Saturation Not Entered: Pain Level Not Entered: BMI 36  BMI : 36.03 kg/m2  Subjective: This 18 Years 65 Months old Female presents forof Soft tissue mass on right medial thigh.  This had been present for several years and slowly increased in size. It does cause discomfort especially 1 time especially including or with movement. No drainage. No significant swelling. No fevers or chills. No similar lesions in the past.  Review of Symptoms:  Constitutional:unremarkable Head:unremarkable Eyes:unremarkable Nose/Mouth/Throat:unremarkable Cardiovascular:unremarkable COPD Gastrointestinal:unremarkable Genitourinary:unremarkable Musculoskeletal:unremarkable As per history of present Illness. Hematolgic/Lymphatic:unremarkablekable Allergic/Immunologic:unremarkable   Past Medical History:Obtained   Past Medical History  Surgical History: Cholecystectomy, partial hysterectomy Medical Problems: diabetes mellitus type 2, obesity, keloid formation, hypertension Allergies: no known drug allergies Medications: Diabetic medication patient unsure of name   Social History:Obtained  Social History  No EtOH   Smoking Status: Never smoker reviewed on 04/06/2012  Family History:Obtained   Family History  Is there a family history [id number]    Objective Information: General:Well appearing, well nourished in no distress.Obese Skin:no rash or prominent lesions Except on right medial thigh. There is a pedunculated approximate 1.5 cm soft tissue mass. Mobile. Nontender. No erythema. Head:Atraumatic; no masses; no abnormalities Eyes:conjunctiva clear, EOM intact, PERRL Mouth:Mucous membranes moist, no mucosal  lesions. Neck:Supple without lymphadenopathy.  Heart:RRR, no murmur Lungs:CTA bilaterally, no wheezes, rhonchi, rales.  Breathing unlabored. Abdomen:Soft, NT/ND, no HSM, no masses.  Assessment:  Diagnosis &amp; Procedure: DiagnosisCode: 782.2, ProcedureCode: 47829,    Plan: Soft tissue mass of the right medial thigh. I did discuss with the patient the benign findings. I suspect this is a pedunculated lipomatous lesion. Risks benefits alternatives of excision were discussed. patient will schedule her convenience.  Patient Education:Alternative treatments to surgery were discussed with patient (and family).Risks and benefits  of procedure were fully explained to the patient (and family) who gave informed consent. Patient/family questions were addressed.  Follow-up:Pending Surgery                                     Active Diagnosis and Procedures: 782.2 Localized superficial swelling, mass, or lump   99203 - OFFICE OUTPATIENT NEW 30 MINUTES

## 2012-04-30 ENCOUNTER — Ambulatory Visit (HOSPITAL_COMMUNITY): Payer: Medicaid Other | Admitting: Anesthesiology

## 2012-04-30 ENCOUNTER — Encounter (HOSPITAL_COMMUNITY): Payer: Self-pay | Admitting: Anesthesiology

## 2012-04-30 ENCOUNTER — Encounter (HOSPITAL_COMMUNITY): Payer: Self-pay | Admitting: *Deleted

## 2012-04-30 ENCOUNTER — Encounter (HOSPITAL_COMMUNITY)
Admission: RE | Disposition: A | Discharge status: Home / Self Care | Payer: Self-pay | Source: Ambulatory Visit | Attending: General Surgery

## 2012-04-30 ENCOUNTER — Ambulatory Visit (HOSPITAL_COMMUNITY)
Admission: RE | Admit: 2012-04-30 | Discharge: 2012-04-30 | Disposition: A | Discharge status: Home / Self Care | Payer: Medicaid Other | Source: Ambulatory Visit | Attending: General Surgery | Admitting: General Surgery

## 2012-04-30 DIAGNOSIS — R229 Localized swelling, mass and lump, unspecified: Secondary | ICD-10-CM | POA: Insufficient documentation

## 2012-04-30 DIAGNOSIS — E119 Type 2 diabetes mellitus without complications: Secondary | ICD-10-CM | POA: Insufficient documentation

## 2012-04-30 DIAGNOSIS — M7989 Other specified soft tissue disorders: Secondary | ICD-10-CM

## 2012-04-30 DIAGNOSIS — Z01812 Encounter for preprocedural laboratory examination: Secondary | ICD-10-CM | POA: Insufficient documentation

## 2012-04-30 DIAGNOSIS — J449 Chronic obstructive pulmonary disease, unspecified: Secondary | ICD-10-CM | POA: Insufficient documentation

## 2012-04-30 DIAGNOSIS — D1779 Benign lipomatous neoplasm of other sites: Secondary | ICD-10-CM | POA: Insufficient documentation

## 2012-04-30 DIAGNOSIS — Z79899 Other long term (current) drug therapy: Secondary | ICD-10-CM | POA: Insufficient documentation

## 2012-04-30 DIAGNOSIS — Z01810 Encounter for preprocedural cardiovascular examination: Secondary | ICD-10-CM | POA: Insufficient documentation

## 2012-04-30 DIAGNOSIS — J4489 Other specified chronic obstructive pulmonary disease: Secondary | ICD-10-CM | POA: Insufficient documentation

## 2012-04-30 HISTORY — PX: LIPOMA EXCISION: SHX5283

## 2012-04-30 LAB — GLUCOSE, CAPILLARY: Glucose-Capillary: 161 mg/dL — ABNORMAL HIGH (ref 70–99)

## 2012-04-30 SURGERY — EXCISION LIPOMA
Anesthesia: General | Site: Thigh | Laterality: Right | Wound class: Clean

## 2012-04-30 MED ORDER — ONDANSETRON HCL 4 MG/2ML IJ SOLN: 4.0000 mg | Freq: Once | INTRAMUSCULAR | Status: DC | PRN

## 2012-04-30 MED ORDER — ONDANSETRON HCL 4 MG/2ML IJ SOLN
4.0000 mg | Freq: Once | INTRAMUSCULAR | Status: AC
  Administered 2012-04-30: 4 mg via INTRAVENOUS

## 2012-04-30 MED ORDER — SODIUM CHLORIDE 0.9 % IR SOLN
Status: DC | PRN
  Administered 2012-04-30: 1000 mL

## 2012-04-30 MED ORDER — BUPIVACAINE HCL (PF) 0.5 % IJ SOLN
INTRAMUSCULAR | Status: AC
  Filled 2012-04-30: qty 30

## 2012-04-30 MED ORDER — LIDOCAINE HCL (PF) 1 % IJ SOLN
INTRAMUSCULAR | Status: AC
  Filled 2012-04-30: qty 5

## 2012-04-30 MED ORDER — ENOXAPARIN SODIUM 40 MG/0.4ML ~~LOC~~ SOLN
SUBCUTANEOUS | Status: AC
  Administered 2012-04-30: 40 mg via SUBCUTANEOUS
  Filled 2012-04-30: qty 0.4

## 2012-04-30 MED ORDER — PROPOFOL 10 MG/ML IV EMUL
INTRAVENOUS | Status: AC
  Filled 2012-04-30: qty 20

## 2012-04-30 MED ORDER — CEFAZOLIN SODIUM-DEXTROSE 2-3 GM-% IV SOLR
INTRAVENOUS | Status: AC
  Filled 2012-04-30: qty 50

## 2012-04-30 MED ORDER — LIDOCAINE-EPINEPHRINE (PF) 1 %-1:200000 IJ SOLN
INTRAMUSCULAR | Status: AC
  Filled 2012-04-30: qty 10

## 2012-04-30 MED ORDER — FENTANYL CITRATE 0.05 MG/ML IJ SOLN
INTRAMUSCULAR | Status: DC | PRN
  Administered 2012-04-30 (×2): 25 ug via INTRAVENOUS
  Administered 2012-04-30: 50 ug via INTRAVENOUS

## 2012-04-30 MED ORDER — FENTANYL CITRATE 0.05 MG/ML IJ SOLN
INTRAMUSCULAR | Status: AC
  Filled 2012-04-30: qty 2

## 2012-04-30 MED ORDER — GLYCOPYRROLATE 0.2 MG/ML IJ SOLN
INTRAMUSCULAR | Status: AC
  Administered 2012-04-30: 0.2 mg via INTRAVENOUS
  Filled 2012-04-30: qty 1

## 2012-04-30 MED ORDER — BACITRACIN ZINC 500 UNIT/GM EX OINT
TOPICAL_OINTMENT | CUTANEOUS | Status: AC
  Filled 2012-04-30: qty 0.9

## 2012-04-30 MED ORDER — HYDROCODONE-ACETAMINOPHEN 5-325 MG PO TABS: 1.0000 | ORAL_TABLET | ORAL | Status: AC | PRN

## 2012-04-30 MED ORDER — LIDOCAINE HCL 1 % IJ SOLN
INTRAMUSCULAR | Status: DC | PRN
  Administered 2012-04-30: 40 mg via INTRADERMAL

## 2012-04-30 MED ORDER — FENTANYL CITRATE 0.05 MG/ML IJ SOLN: 25.0000 ug | INTRAMUSCULAR | Status: DC | PRN

## 2012-04-30 MED ORDER — LACTATED RINGERS IV SOLN
INTRAVENOUS | Status: DC
  Administered 2012-04-30: 09:00:00 via INTRAVENOUS

## 2012-04-30 MED ORDER — ACETAMINOPHEN 325 MG PO TABS: 325.0000 mg | ORAL_TABLET | ORAL | Status: DC | PRN

## 2012-04-30 MED ORDER — ONDANSETRON HCL 4 MG/2ML IJ SOLN
INTRAMUSCULAR | Status: AC
  Administered 2012-04-30: 4 mg via INTRAVENOUS
  Filled 2012-04-30: qty 2

## 2012-04-30 MED ORDER — MIDAZOLAM HCL 2 MG/2ML IJ SOLN
1.0000 mg | INTRAMUSCULAR | Status: DC | PRN
  Administered 2012-04-30: 2 mg via INTRAVENOUS

## 2012-04-30 MED ORDER — CEFAZOLIN SODIUM-DEXTROSE 2-3 GM-% IV SOLR: 2.0000 g | INTRAVENOUS | Status: DC

## 2012-04-30 MED ORDER — BUPIVACAINE HCL (PF) 0.5 % IJ SOLN
INTRAMUSCULAR | Status: DC | PRN
  Administered 2012-04-30: 30 mL

## 2012-04-30 MED ORDER — GLYCOPYRROLATE 0.2 MG/ML IJ SOLN
0.2000 mg | Freq: Once | INTRAMUSCULAR | Status: AC
  Administered 2012-04-30: 0.2 mg via INTRAVENOUS

## 2012-04-30 MED ORDER — MIDAZOLAM HCL 2 MG/2ML IJ SOLN
INTRAMUSCULAR | Status: AC
  Administered 2012-04-30: 2 mg via INTRAVENOUS
  Filled 2012-04-30: qty 2

## 2012-04-30 MED ORDER — ENOXAPARIN SODIUM 40 MG/0.4ML ~~LOC~~ SOLN
40.0000 mg | Freq: Once | SUBCUTANEOUS | Status: AC
  Administered 2012-04-30: 40 mg via SUBCUTANEOUS

## 2012-04-30 MED ORDER — CEFAZOLIN SODIUM 1-5 GM-% IV SOLN
INTRAVENOUS | Status: DC | PRN
  Administered 2012-04-30: 2 g via INTRAVENOUS

## 2012-04-30 MED ORDER — PROPOFOL 10 MG/ML IV BOLUS
INTRAVENOUS | Status: DC | PRN
  Administered 2012-04-30: 150 mg via INTRAVENOUS

## 2012-04-30 SURGICAL SUPPLY — 35 items
APL SKNCLS STERI-STRIP NONHPOA (GAUZE/BANDAGES/DRESSINGS) ×1
BAG HAMPER (MISCELLANEOUS) ×2 IMPLANT
BENZOIN TINCTURE PRP APPL 2/3 (GAUZE/BANDAGES/DRESSINGS) ×2 IMPLANT
CLOTH BEACON ORANGE TIMEOUT ST (SAFETY) ×2 IMPLANT
COVER LIGHT HANDLE STERIS (MISCELLANEOUS) ×4 IMPLANT
DURAPREP 26ML APPLICATOR (WOUND CARE) ×2 IMPLANT
ELECT NDL TIP 2.8 STRL (NEEDLE) IMPLANT
ELECT NEEDLE TIP 2.8 STRL (NEEDLE) IMPLANT
ELECT REM PT RETURN 9FT ADLT (ELECTROSURGICAL) ×2
ELECTRODE REM PT RTRN 9FT ADLT (ELECTROSURGICAL) ×1 IMPLANT
FORMALIN 10 PREFIL 120ML (MISCELLANEOUS) ×2 IMPLANT
GLOVE BIOGEL PI IND STRL 7.5 (GLOVE) ×1 IMPLANT
GLOVE BIOGEL PI INDICATOR 7.5 (GLOVE) ×2
GLOVE ECLIPSE 7.0 STRL STRAW (GLOVE) ×1 IMPLANT
GLOVE SKINSENSE NS SZ7.0 (GLOVE) ×2
GLOVE SKINSENSE STRL SZ7.0 (GLOVE) ×2 IMPLANT
GOWN STRL REIN XL XLG (GOWN DISPOSABLE) ×2 IMPLANT
KIT ROOM TURNOVER APOR (KITS) ×2 IMPLANT
MANIFOLD NEPTUNE II (INSTRUMENTS) ×2 IMPLANT
NDL HYPO 25X1 1.5 SAFETY (NEEDLE) ×1 IMPLANT
NEEDLE HYPO 18GX1.5 BLUNT FILL (NEEDLE) IMPLANT
NEEDLE HYPO 25X1 1.5 SAFETY (NEEDLE) ×2 IMPLANT
NS IRRIG 1000ML POUR BTL (IV SOLUTION) ×2 IMPLANT
PACK MINOR (CUSTOM PROCEDURE TRAY) ×2 IMPLANT
PAD ARMBOARD 7.5X6 YLW CONV (MISCELLANEOUS) ×2 IMPLANT
SET BASIN LINEN APH (SET/KITS/TRAYS/PACK) ×2 IMPLANT
SOL PREP PROV IODINE SCRUB 4OZ (MISCELLANEOUS) IMPLANT
SPONGE LAP 18X18 X RAY DECT (DISPOSABLE) ×2 IMPLANT
STRIP CLOSURE SKIN 1/2X4 (GAUZE/BANDAGES/DRESSINGS) ×2 IMPLANT
SUT MNCRL AB 4-0 PS2 18 (SUTURE) ×2 IMPLANT
SUT PROLENE 3 0 PS 1 (SUTURE) IMPLANT
SUT VIC AB 3-0 SH 27 (SUTURE) ×2
SUT VIC AB 3-0 SH 27X BRD (SUTURE) IMPLANT
SYR BULB IRRIGATION 50ML (SYRINGE) ×2 IMPLANT
SYR CONTROL 10ML LL (SYRINGE) ×2 IMPLANT

## 2012-04-30 NOTE — Progress Notes (Signed)
States small amt of pain present but refuses pain med at this time.

## 2012-04-30 NOTE — Anesthesia Preprocedure Evaluation (Signed)
Anesthesia Evaluation  Patient identified by MRN, date of birth, ID band Patient awake    Reviewed: Allergy & Precautions, H&P , NPO status , Patient's Chart, lab work & pertinent test results  Airway Mallampati: I TM Distance: >3 FB Neck ROM: Full    Dental No notable dental hx.    Pulmonary COPD   Pulmonary exam normal       Cardiovascular hypertension, Pt. on medications Rhythm:Regular Rate:Normal     Neuro/Psych PSYCHIATRIC DISORDERS Anxiety Depression    GI/Hepatic negative GI ROS, Neg liver ROS,   Endo/Other  Diabetes mellitus-, Well Controlled, Type 2, Oral Hypoglycemic Agents  Renal/GU negative Renal ROS     Musculoskeletal negative musculoskeletal ROS (+)   Abdominal Normal abdominal exam  (+)   Peds  Hematology negative hematology ROS (+)   Anesthesia Other Findings   Reproductive/Obstetrics negative OB ROS                           Anesthesia Physical Anesthesia Plan  ASA: II  Anesthesia Plan: General   Post-op Pain Management:    Induction: Intravenous  Airway Management Planned: LMA  Additional Equipment:   Intra-op Plan:   Post-operative Plan: Extubation in OR  Informed Consent: I have reviewed the patients History and Physical, chart, labs and discussed the procedure including the risks, benefits and alternatives for the proposed anesthesia with the patient or authorized representative who has indicated his/her understanding and acceptance.     Plan Discussed with: CRNA  Anesthesia Plan Comments:         Anesthesia Quick Evaluation

## 2012-04-30 NOTE — Anesthesia Procedure Notes (Signed)
Procedure Name: LMA Insertion Date/Time: 04/30/2012 9:34 AM Performed by: Glynn Octave E Pre-anesthesia Checklist: Patient identified, Patient being monitored, Emergency Drugs available, Timeout performed and Suction available Patient Re-evaluated:Patient Re-evaluated prior to inductionOxygen Delivery Method: Circle System Utilized Preoxygenation: Pre-oxygenation with 100% oxygen Intubation Type: IV induction Ventilation: Mask ventilation without difficulty LMA: LMA inserted LMA Size: 4.0 and 3.0 Number of attempts: 1 Placement Confirmation: positive ETCO2 and breath sounds checked- equal and bilateral

## 2012-04-30 NOTE — Interval H&P Note (Signed)
History and Physical Interval Note:  04/30/2012 9:04 AM  Kayla Choi  has presented today for surgery, with the diagnosis of Sebaceous cyst   The various methods of treatment have been discussed with the patient and family. After consideration of risks, benefits and other options for treatment, the patient has consented to  Procedure(s) (LRB): EXCISION LIPOMA (Right) as a surgical intervention .  The patients' history has been reviewed, patient examined, no change in status, stable for surgery.  I have reviewed the patients' chart and labs.  Questions were answered to the patient's satisfaction.     Puneet Selden C

## 2012-04-30 NOTE — Op Note (Signed)
Patient:  Kayla Choi  DOB:  04/01/1966  MRN:  [id number]   Preop Diagnosis:  Soft tissue mass of right medial thigh  Postop Diagnosis:  The same  Procedure:  Excision of soft tissue mass via a 3.5 cm incision.  Surgeon:  Dr. Tilford Pillar  Anes:  General via laryngeal mask airway, 0.5% Sensorcaine plain  Indications:  Patient is 46 rolled female presented my office with a history of a pedunculated soft tissue mass on her right medial thigh. This is been giving her more symptomatology. Risks benefits alternatives of excision were discussed. Her questions and concerns were addressed. Patient was consented for the planned procedure.  Procedure note:  She was taken to the OR placed in supine position on the or table time the general anesthetic was a Optician, dispensing. Once she was asleep a larger mask airway placed by the nurse anesthetist. At this point the patient was placed into frog-leg positioning. Her medial thigh was prepped with prep solution. Drapes are placed in standard fashion. A vertical elliptical incision was created around the soft tissue mass. Additional dissection down to subcuticular tissues carried using electrocautery. The soft tissue mass was fully excised. He was placed on the back table and sent as a permanent specimen to pathology. Local anesthetic was instilled. Hemostasis was excellent. The deep subcuticular tissues reapproximated using a 3-0 Vicryl in [place] fashion. A 4-0 Monocryl was utilized a running subcuticular suture to reapproximate the skin edges. Skin was washed dried moist dry towel. Benzoin is applied around incision. Half-inch are suture placed. The drapes removed the patient left come out of general static is Bethann Berkshire the PACU in stable condition. At the conclusion of procedure all instrument, sponge, needle counts are correct. Patient tolerated procedure extremely well.  Complications:  None  EBL:  Minimal  Specimen:  Soft tissue mass/lipoma

## 2012-04-30 NOTE — Anesthesia Postprocedure Evaluation (Signed)
  Anesthesia Post-op Note  Patient: Kayla Choi  Procedure(s) Performed: Procedure(s) (LRB): EXCISION LIPOMA (Right)  Patient Location: PACU  Anesthesia Type: General  Level of Consciousness: awake, alert  and oriented  Airway and Oxygen Therapy: Patient Spontanous Breathing and Patient connected to face mask oxygen  Post-op Pain: none  Post-op Assessment: Post-op Vital signs reviewed, Patient's Cardiovascular Status Stable, Respiratory Function Stable, Patent Airway and No signs of Nausea or vomiting  Post-op Vital Signs: Reviewed and stable  Complications: No apparent anesthesia complications

## 2012-04-30 NOTE — Transfer of Care (Signed)
Immediate Anesthesia Transfer of Care Note  Patient: Kayla Choi  Procedure(s) Performed: Procedure(s) (LRB): EXCISION LIPOMA (Right)  Patient Location: PACU  Anesthesia Type: General  Level of Consciousness: awake, alert  and oriented  Airway & Oxygen Therapy: Patient Spontanous Breathing and Patient connected to face mask oxygen  Post-op Assessment: Report given to PACU RN  Post vital signs: Reviewed and stable  Complications: No apparent anesthesia complications

## 2012-05-04 ENCOUNTER — Encounter (HOSPITAL_COMMUNITY): Payer: Self-pay | Admitting: General Surgery

## 2012-06-25 ENCOUNTER — Encounter: Payer: Self-pay | Admitting: Family Medicine

## 2012-06-25 ENCOUNTER — Ambulatory Visit (INDEPENDENT_AMBULATORY_CARE_PROVIDER_SITE_OTHER): Payer: Medicaid Other | Admitting: Family Medicine

## 2012-06-25 VITALS — BP 130/74 | HR 94 | Resp 18 | Ht 62.5 in | Wt 196.1 lb

## 2012-06-25 DIAGNOSIS — R809 Proteinuria, unspecified: Secondary | ICD-10-CM

## 2012-06-25 DIAGNOSIS — I10 Essential (primary) hypertension: Secondary | ICD-10-CM

## 2012-06-25 DIAGNOSIS — E119 Type 2 diabetes mellitus without complications: Secondary | ICD-10-CM

## 2012-06-25 DIAGNOSIS — F3289 Other specified depressive episodes: Secondary | ICD-10-CM

## 2012-06-25 DIAGNOSIS — Z658 Other specified problems related to psychosocial circumstances: Secondary | ICD-10-CM

## 2012-06-25 DIAGNOSIS — F329 Major depressive disorder, single episode, unspecified: Secondary | ICD-10-CM

## 2012-06-25 DIAGNOSIS — F430 Acute stress reaction: Secondary | ICD-10-CM

## 2012-06-25 DIAGNOSIS — J449 Chronic obstructive pulmonary disease, unspecified: Secondary | ICD-10-CM

## 2012-06-25 DIAGNOSIS — E669 Obesity, unspecified: Secondary | ICD-10-CM

## 2012-06-25 LAB — CBC WITH DIFFERENTIAL/PLATELET
Basophils Absolute: 0 10*3/uL (ref 0.0–0.1)
Basophils Relative: 1 % (ref 0–1)
Eosinophils Absolute: 0.1 10*3/uL (ref 0.0–0.7)
Eosinophils Relative: 1 % (ref 0–5)
MCH: 30.6 pg (ref 26.0–34.0)
MCHC: 32.8 g/dL (ref 30.0–36.0)
MCV: 93.2 fL (ref 78.0–100.0)
Neutrophils Relative %: 58 % (ref 43–77)
Platelets: 379 10*3/uL (ref 150–400)
RBC: 4.41 MIL/uL (ref 3.87–5.11)
RDW: 13 % (ref 11.5–15.5)

## 2012-06-25 LAB — COMPREHENSIVE METABOLIC PANEL
ALT: 19 U/L (ref 0–35)
Albumin: 3.9 g/dL (ref 3.5–5.2)
Alkaline Phosphatase: 37 U/L — ABNORMAL LOW (ref 39–117)
Glucose, Bld: 154 mg/dL — ABNORMAL HIGH (ref 70–99)
Potassium: 4 mEq/L (ref 3.5–5.3)
Sodium: 137 mEq/L (ref 135–145)
Total Bilirubin: 0.7 mg/dL (ref 0.3–1.2)
Total Protein: 7 g/dL (ref 6.0–8.3)

## 2012-06-25 LAB — HEMOGLOBIN A1C
Hgb A1c MFr Bld: 6.1 % — ABNORMAL HIGH (ref <5.7)
Mean Plasma Glucose: 128 mg/dL — ABNORMAL HIGH (ref <117)

## 2012-06-25 LAB — LDL CHOLESTEROL, DIRECT: Direct LDL: 136 mg/dL — ABNORMAL HIGH

## 2012-06-25 NOTE — Assessment & Plan Note (Signed)
Breathing is stable, no change to inhalers

## 2012-06-25 NOTE — Patient Instructions (Signed)
Continue your current medications Get the labs done we will call with results I will call your daughter F/U 3 months

## 2012-06-25 NOTE — Progress Notes (Signed)
  Subjective:    Patient ID: Kayla Choi, female    DOB: 03-17-1966, 46 y.o.   MRN: [id number]  HPI  Pt here to f/u chronic medical problems. She has been trying to exercise some states her clothes are fitting a little looser. She has been having leg swelling in hand swelling mostly in the morning but she also has it at the end of the day and she's been up on her feet a lot. She's been taking her medications as prescribed. Her breathing has been okay except when she is at home because she thinks she has multiple round her house. She also tells me that she is not getting enough to eat because there is no food in the home. She has 2 adult children at work and also in school that live with her therefore she does not qualify for food seems. She tends to get food from the church in food banks once a month. She states that her children often eat out and they are not at home therefore there is no food in the house for her. She's had some mild abdominal pain for the past 2 days but nothing that she was very concerned about her bowels have not changed she denies fever nausea, vomiting. She is still following with mental health   Review of Systems   GEN- denies fatigue, fever, weight loss,weakness, recent illness HEENT- denies eye drainage, change in vision, nasal discharge, CVS- denies chest pain, palpitations RESP- denies SOB, cough, wheeze ABD- denies N/V, change in stools, +abd pain GU- denies dysuria, hematuria, dribbling, incontinence MSK- denies joint pain, muscle aches, injury Neuro- denies headache, dizziness, syncope, seizure activity      Objective:   Physical Exam GEN- NAD, alert and oriented x3 HEENT- PERRL, EOMI, non injected sclera, pink conjunctiva, MMM, oropharynx clear Neck- Supple, no thryomegaly CVS- RRR, no murmur RESP-CTAB ABD-NABS,soft,Mild TTP LLQ, no rebound, no gaurding, no mass  EXT- No edema Pulses- Radial, DP- 2+ Psych-normal affect and mood          Assessment & Plan:   SonRussella Dar [phone]

## 2012-06-25 NOTE — Assessment & Plan Note (Signed)
Recheck now that she is on ACEI

## 2012-06-25 NOTE — Addendum Note (Signed)
Addended by: Kandis Fantasia B on: 06/25/2012 02:47 PM   Modules accepted: Orders

## 2012-06-25 NOTE — Assessment & Plan Note (Signed)
Bp looks okay today. Repeat urine creatinine, on ACEI

## 2012-06-25 NOTE — Assessment & Plan Note (Signed)
Unchanged, followed by mental health

## 2012-06-25 NOTE — Assessment & Plan Note (Signed)
Diabetes was picked up in very early stages, last A1C very well controlled, no change to medication, check A1C

## 2012-06-25 NOTE — Assessment & Plan Note (Signed)
News regarding her not having any food in the home is very concerning. She has 2 adult children that both work that live in at home with her. I did speak with her daughter on the telephone Courtney Paris. She tells me that she does work and she also goes to school she is not home a lot and that they make their food separately but she did not give any explanation for why they do not leave food or money for the mother states she is too busy to go to the grocery store. I attempted to call her son Casimiro Needle however voice message was left. 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 She does go to the local food bank monthly however with her children living at home in there and comes she does not qualify for food stamps currently. I do not see any reason why her children cannot provide her with money for food as they tend to eat out all day.

## 2012-06-25 NOTE — Assessment & Plan Note (Signed)
She's actually gained 3 pounds with her today she tells me she does not have food he eats I assume she is eating whatever junk foods that she can get. She is trying to walk and walks up and down her stairs in the home for exercise she also walks to some of her neighboring family members

## 2012-06-26 LAB — MICROALBUMIN / CREATININE URINE RATIO: Microalb Creat Ratio: 21 mg/g (ref 0.0–30.0)

## 2012-07-05 ENCOUNTER — Telehealth: Payer: Self-pay | Admitting: Family Medicine

## 2012-07-06 ENCOUNTER — Telehealth: Payer: Self-pay | Admitting: Family Medicine

## 2012-07-06 NOTE — Telephone Encounter (Signed)
Called patient and left message for them to return call at the office   

## 2012-07-06 NOTE — Telephone Encounter (Signed)
Please give lab results: Kidney, liver function normal, no anemia seen, A1C is good at 6.1% Avoid fried foods, fatty foods, eat low cholesterol foods- fresh fruits and veggies No change to medications

## 2012-07-06 NOTE — Telephone Encounter (Signed)
Pt aware. Mailed diet info to her

## 2012-09-24 ENCOUNTER — Ambulatory Visit: Payer: Medicaid Other | Admitting: Family Medicine

## 2012-09-28 ENCOUNTER — Encounter: Payer: Self-pay | Admitting: Family Medicine

## 2012-10-05 ENCOUNTER — Ambulatory Visit (INDEPENDENT_AMBULATORY_CARE_PROVIDER_SITE_OTHER): Payer: Medicaid Other | Admitting: Family Medicine

## 2012-10-05 ENCOUNTER — Encounter: Payer: Self-pay | Admitting: Family Medicine

## 2012-10-05 VITALS — BP 140/94 | HR 97 | Resp 15 | Ht 62.5 in | Wt 195.0 lb

## 2012-10-05 DIAGNOSIS — E785 Hyperlipidemia, unspecified: Secondary | ICD-10-CM

## 2012-10-05 DIAGNOSIS — R519 Headache, unspecified: Secondary | ICD-10-CM | POA: Insufficient documentation

## 2012-10-05 DIAGNOSIS — E119 Type 2 diabetes mellitus without complications: Secondary | ICD-10-CM

## 2012-10-05 DIAGNOSIS — R51 Headache: Secondary | ICD-10-CM

## 2012-10-05 DIAGNOSIS — I10 Essential (primary) hypertension: Secondary | ICD-10-CM

## 2012-10-05 DIAGNOSIS — J449 Chronic obstructive pulmonary disease, unspecified: Secondary | ICD-10-CM

## 2012-10-05 MED ORDER — TOPIRAMATE 25 MG PO TABS: 25.0000 mg | ORAL_TABLET | Freq: Every day | ORAL | Status: DC

## 2012-10-05 MED ORDER — IBUPROFEN 600 MG PO TABS: 600.0000 mg | ORAL_TABLET | Freq: Four times a day (QID) | ORAL | Status: AC | PRN

## 2012-10-05 NOTE — Assessment & Plan Note (Signed)
Recurrent HA, start topamax at bedtime, ibuprofen as needed

## 2012-10-05 NOTE — Assessment & Plan Note (Signed)
BP previously  Well controlled, no change to medications

## 2012-10-05 NOTE — Assessment & Plan Note (Signed)
Check A1C, well controlled

## 2012-10-05 NOTE — Assessment & Plan Note (Signed)
Recheck FLP, goal LDL < 100 

## 2012-10-05 NOTE — Progress Notes (Signed)
  Subjective:    Patient ID: Kayla Choi, female    DOB: 03-15-1966, 46 y.o.   MRN: [id number]  HPI  Pt here to f/u chronic medical problems. Meds reviewed DM- tolerating medications,  HTN- BP meds taken this AM, admits to headaches on and off, worse with stress. She used motrin in the past but none recently, has almost daily headaches Continues to follow with mental health Review of Systems   GEN- denies fatigue, fever, weight loss,weakness, recent illness HEENT- denies eye drainage, change in vision, nasal discharge, CVS- denies chest pain, palpitations RESP- denies SOB, cough, wheeze ABD- denies N/V, change in stools, abd pain GU- denies dysuria, hematuria, dribbling, incontinence MSK- denies joint pain, muscle aches, injury Neuro- +headache, dizziness, syncope, seizure activity      Objective:   Physical Exam GEN- NAD, alert and oriented x3 HEENT- PERRL, EOMI, non injected sclera, pink conjunctiva, MMM, oropharynx clear Neck- Supple,  CVS- RRR, no murmur RESP-CTAB EXT- No edema Pulses- Radial, DP- 2+ Neuro- CNII-XII in tact, no focal deficits       Assessment & Plan:

## 2012-10-05 NOTE — Patient Instructions (Addendum)
For headache start topamax at bedtime Ibuprofen for headache  Continue all other medications Get the labs done fasting 1 week before next visit F/U 3 months

## 2012-10-05 NOTE — Assessment & Plan Note (Signed)
Doing well, Flu shot given

## 2012-10-07 LAB — HEMOGLOBIN A1C
Hgb A1c MFr Bld: 6.4 % — ABNORMAL HIGH (ref <5.7)
Mean Plasma Glucose: 137 mg/dL — ABNORMAL HIGH (ref <117)

## 2012-10-07 LAB — CBC
HCT: 38 % (ref 36.0–46.0)
Hemoglobin: 13.1 g/dL (ref 12.0–15.0)
MCH: 31.1 pg (ref 26.0–34.0)
MCHC: 34.5 g/dL (ref 30.0–36.0)
MCV: 90.3 fL (ref 78.0–100.0)
Platelets: 440 10*3/uL — ABNORMAL HIGH (ref 150–400)
RBC: 4.21 MIL/uL (ref 3.87–5.11)
RDW: 13 % (ref 11.5–15.5)
WBC: 5.7 10*3/uL (ref 4.0–10.5)

## 2012-10-07 LAB — LIPID PANEL
Cholesterol: 233 mg/dL — ABNORMAL HIGH (ref 0–200)
HDL: 43 mg/dL (ref >39)
LDL Cholesterol: 173 mg/dL — ABNORMAL HIGH (ref 0–99)
Total CHOL/HDL Ratio: 5.4 Ratio
Triglycerides: 83 mg/dL (ref <150)
VLDL: 17 mg/dL (ref 0–40)

## 2012-10-07 LAB — COMPREHENSIVE METABOLIC PANEL
AST: 16 U/L (ref 0–37)
BUN: 11 mg/dL (ref 6–23)
Calcium: 9.3 mg/dL (ref 8.4–10.5)
Chloride: 102 mEq/L (ref 96–112)
Creat: 0.76 mg/dL (ref 0.50–1.10)
Total Bilirubin: 1.3 mg/dL — ABNORMAL HIGH (ref 0.3–1.2)

## 2012-10-08 ENCOUNTER — Other Ambulatory Visit: Payer: Self-pay

## 2012-10-08 MED ORDER — SIMVASTATIN 40 MG PO TABS: 40.0000 mg | ORAL_TABLET | Freq: Every evening | ORAL | Status: DC

## 2012-12-16 DIAGNOSIS — Y92009 Unspecified place in unspecified non-institutional (private) residence as the place of occurrence of the external cause: Secondary | ICD-10-CM | POA: Insufficient documentation

## 2012-12-16 DIAGNOSIS — E785 Hyperlipidemia, unspecified: Secondary | ICD-10-CM | POA: Insufficient documentation

## 2012-12-16 DIAGNOSIS — F411 Generalized anxiety disorder: Secondary | ICD-10-CM | POA: Insufficient documentation

## 2012-12-16 DIAGNOSIS — X500XXA Overexertion from strenuous movement or load, initial encounter: Secondary | ICD-10-CM | POA: Insufficient documentation

## 2012-12-16 DIAGNOSIS — Z862 Personal history of diseases of the blood and blood-forming organs and certain disorders involving the immune mechanism: Secondary | ICD-10-CM | POA: Insufficient documentation

## 2012-12-16 DIAGNOSIS — Z79899 Other long term (current) drug therapy: Secondary | ICD-10-CM | POA: Insufficient documentation

## 2012-12-16 DIAGNOSIS — F3289 Other specified depressive episodes: Secondary | ICD-10-CM | POA: Insufficient documentation

## 2012-12-16 DIAGNOSIS — J449 Chronic obstructive pulmonary disease, unspecified: Secondary | ICD-10-CM | POA: Insufficient documentation

## 2012-12-16 DIAGNOSIS — S93409A Sprain of unspecified ligament of unspecified ankle, initial encounter: Secondary | ICD-10-CM | POA: Insufficient documentation

## 2012-12-16 DIAGNOSIS — J4489 Other specified chronic obstructive pulmonary disease: Secondary | ICD-10-CM | POA: Insufficient documentation

## 2012-12-16 DIAGNOSIS — F4010 Social phobia, unspecified: Secondary | ICD-10-CM | POA: Insufficient documentation

## 2012-12-16 DIAGNOSIS — E119 Type 2 diabetes mellitus without complications: Secondary | ICD-10-CM | POA: Insufficient documentation

## 2012-12-16 DIAGNOSIS — I10 Essential (primary) hypertension: Secondary | ICD-10-CM | POA: Insufficient documentation

## 2012-12-16 DIAGNOSIS — IMO0002 Reserved for concepts with insufficient information to code with codable children: Secondary | ICD-10-CM | POA: Insufficient documentation

## 2012-12-16 DIAGNOSIS — Y9389 Activity, other specified: Secondary | ICD-10-CM | POA: Insufficient documentation

## 2012-12-16 DIAGNOSIS — F988 Other specified behavioral and emotional disorders with onset usually occurring in childhood and adolescence: Secondary | ICD-10-CM | POA: Insufficient documentation

## 2012-12-16 DIAGNOSIS — F329 Major depressive disorder, single episode, unspecified: Secondary | ICD-10-CM | POA: Insufficient documentation

## 2012-12-16 DIAGNOSIS — H409 Unspecified glaucoma: Secondary | ICD-10-CM | POA: Insufficient documentation

## 2012-12-16 DIAGNOSIS — Z9889 Other specified postprocedural states: Secondary | ICD-10-CM | POA: Insufficient documentation

## 2012-12-17 ENCOUNTER — Emergency Department (HOSPITAL_COMMUNITY): Payer: Medicaid Other

## 2012-12-17 ENCOUNTER — Encounter (HOSPITAL_COMMUNITY): Payer: Self-pay | Admitting: *Deleted

## 2012-12-17 ENCOUNTER — Emergency Department (HOSPITAL_COMMUNITY)
Admission: EM | Admit: 2012-12-17 | Discharge: 2012-12-17 | Disposition: A | Discharge status: Home / Self Care | Payer: Medicaid Other | Attending: Emergency Medicine | Admitting: Emergency Medicine

## 2012-12-17 DIAGNOSIS — S93401A Sprain of unspecified ligament of right ankle, initial encounter: Secondary | ICD-10-CM

## 2012-12-17 NOTE — ED Provider Notes (Signed)
History     CSN: [id number]  Arrival date & time 12/16/12  2358   First MD Initiated Contact with Patient 12/17/12 0026      Chief Complaint  Patient presents with  . Ankle Pain    (Consider location/radiation/quality/duration/timing/severity/associated sxs/prior treatment) HPI Comments: Stepped off the edge of a sidewalk and inverted R ankle.  Has had pain since.  Patient is a 47 y.o. female presenting with ankle pain. The history is provided by the patient. No language interpreter was used.  Ankle Pain  Incident onset: 2 weeks ago. The incident occurred at home. The injury mechanism was a fall. The pain is present in the right ankle. The quality of the pain is described as throbbing. The pain is moderate. The pain has been constant since onset. Pertinent negatives include no numbness. She reports no foreign bodies present. The symptoms are aggravated by palpation and bearing weight. Treatments tried: aleve. The treatment provided mild relief.    Past Medical History  Diagnosis Date  . Hypertension   . COPD (chronic obstructive pulmonary disease)   . Hyperlipidemia   . Prediabetes   . Anxiety     with social phobia  . Anemia     iron deficinecy  . Depression   . ADD (attention deficit disorder)   . Glaucoma(365)   . Diabetes mellitus   . Social phobia     Past Surgical History  Procedure Date  . Cholecystectomy   . Right elbow     reconstruction  . Abdominal hysterectomy     fibroids, both ovaries left intact  . Cosmetic surgery     elbow  . Fracture surgery     Recurrent elbow surgery  . Lipoma excision 04/30/2012    Procedure: EXCISION LIPOMA;  Surgeon: Fabio Bering, MD;  Location: [facility];  Service: General;  Laterality: Right;  Excision soft tissue mass right thigh    Family History  Problem Relation Age of Onset  . Hypertension Mother   . Diabetes Father   . Heart disease Father   . Anesthesia problems Neg Hx   . Malignant hyperthermia Neg Hx   .  Hypotension Neg Hx   . Pseudochol deficiency Neg Hx     History  Substance Use Topics  . Smoking status: Never Smoker   . Smokeless tobacco: Not on file  . Alcohol Use: No    OB History    Grav Para Term Preterm Abortions TAB SAB Ect Mult Living                  Review of Systems  Musculoskeletal:       Ankle pain  Skin: Negative for wound.  Neurological: Negative for weakness and numbness.  All other systems reviewed and are negative.    Allergies  Aspirin; Metformin and related; Other; Latex; and Neosporin  Home Medications   Current Outpatient Rx  Name  Route  Sig  Dispense  Refill  . BUDESONIDE-FORMOTEROL FUMARATE 160-4.5 MCG/ACT IN AERO   Inhalation   Inhale 2 puffs into the lungs 2 (two) times daily.           . BUPROPION HCL ER (XL) 300 MG PO TB24   Oral   Take 300 mg by mouth daily.           Marland Kitchen FLUTICASONE PROPIONATE 50 MCG/ACT NA SUSP   Nasal   Place 2 sprays into the nose daily.           Marland Kitchen  GLIPIZIDE 5 MG PO TABS   Oral   Take 1 tablet (5 mg total) by mouth daily.   30 tablet   1   . HYDROCHLOROTHIAZIDE 25 MG PO TABS   Oral   Take 1 tablet (25 mg total) by mouth daily.   30 tablet   3   . LISINOPRIL 2.5 MG PO TABS   Oral   Take 1 tablet (2.5 mg total) by mouth daily.   30 tablet   3   . LORATADINE 10 MG PO TABS   Oral   Take 10 mg by mouth daily.           Marland Kitchen LORAZEPAM 0.5 MG PO TABS   Oral   Take 0.5 mg by mouth at bedtime as needed. Sleep/anxiety         . METHYLPHENIDATE HCL 10 MG PO TABS   Oral   Take 10 mg by mouth 2 (two) times daily.           . SERTRALINE HCL 100 MG PO TABS   Oral   Take 100 mg by mouth daily.           Marland Kitchen SIMVASTATIN 40 MG PO TABS   Oral   Take 1 tablet (40 mg total) by mouth every evening.   30 tablet   3   . TIOTROPIUM BROMIDE MONOHYDRATE 18 MCG IN CAPS   Inhalation   Place 18 mcg into inhaler and inhale daily.           . TOPIRAMATE 25 MG PO TABS   Oral   Take 1 tablet (25  mg total) by mouth at bedtime.   30 tablet   2     BP 125/99  Pulse 90  Temp 97.8 F (36.6 C) (Oral)  Resp 18  Ht 5\' 2"  (1.575 m)  Wt 198 lb (89.812 kg)  BMI 36.21 kg/m2  SpO2 100%  Physical Exam  Nursing note and vitals reviewed. Constitutional: She is oriented to person, place, and time. She appears well-developed and well-nourished. No distress.  HENT:  Head: Normocephalic and atraumatic.  Eyes: EOM are normal.  Neck: Normal range of motion.  Cardiovascular: Normal rate, regular rhythm and normal heart sounds.   Pulmonary/Chest: Effort normal and breath sounds normal.  Abdominal: Soft. She exhibits no distension. There is no tenderness.  Musculoskeletal: She exhibits tenderness.       Right ankle: She exhibits decreased range of motion and swelling. She exhibits no ecchymosis, no deformity, no laceration and normal pulse. tenderness. Medial malleolus tenderness found. No proximal fibula tenderness found.  Neurological: She is alert and oriented to person, place, and time.  Skin: Skin is warm and dry.  Psychiatric: She has a normal mood and affect. Judgment normal.    ED Course  Procedures (including critical care time)  Labs Reviewed - No data to display Dg Ankle Complete Right  12/17/2012  *RADIOLOGY REPORT*  Clinical Data: Ankle pain and bruising after injury 2 weeks ago.  RIGHT ANKLE - COMPLETE 3+ VIEW  Comparison: None.  Findings: No acute bony or joint abnormality is identified.  There appears to be some soft tissue swelling about the ankle.  IMPRESSION: Swelling without underlying acute bony or joint abnormality.   Original Report Authenticated By: Holley Dexter, M.D.      1. Right ankle sprain       MDM  ASO splint, crutches, ice, elevation. Aleve for pain.  F/u with dr. Jeanice Lim prn  Evalina Field, [place] 12/17/12 [phone]

## 2012-12-17 NOTE — ED Notes (Signed)
Pt reporting pain in right ankle for about 2 weeks.  Also reporting pain in right hip, due to limping.

## 2012-12-18 NOTE — ED Provider Notes (Signed)
Medical screening examination/treatment/procedure(s) were performed by non-physician practitioner and as supervising physician I was immediately available for consultation/collaboration.  Joya Gaskins, MD 12/18/12 [phone]

## 2013-01-07 ENCOUNTER — Encounter: Payer: Self-pay | Admitting: Family Medicine

## 2013-01-07 ENCOUNTER — Ambulatory Visit (INDEPENDENT_AMBULATORY_CARE_PROVIDER_SITE_OTHER): Payer: Medicaid Other | Admitting: Family Medicine

## 2013-01-07 VITALS — BP 130/84 | HR 100 | Resp 18 | Ht 62.5 in | Wt 192.0 lb

## 2013-01-07 DIAGNOSIS — E785 Hyperlipidemia, unspecified: Secondary | ICD-10-CM

## 2013-01-07 DIAGNOSIS — R51 Headache: Secondary | ICD-10-CM

## 2013-01-07 DIAGNOSIS — K1370 Unspecified lesions of oral mucosa: Secondary | ICD-10-CM

## 2013-01-07 DIAGNOSIS — Z2089 Contact with and (suspected) exposure to other communicable diseases: Secondary | ICD-10-CM

## 2013-01-07 DIAGNOSIS — L0292 Furuncle, unspecified: Secondary | ICD-10-CM

## 2013-01-07 DIAGNOSIS — Z202 Contact with and (suspected) exposure to infections with a predominantly sexual mode of transmission: Secondary | ICD-10-CM

## 2013-01-07 DIAGNOSIS — L0293 Carbuncle, unspecified: Secondary | ICD-10-CM

## 2013-01-07 DIAGNOSIS — E119 Type 2 diabetes mellitus without complications: Secondary | ICD-10-CM

## 2013-01-07 DIAGNOSIS — I10 Essential (primary) hypertension: Secondary | ICD-10-CM

## 2013-01-07 MED ORDER — CEPHALEXIN 500 MG PO CAPS: 500.0000 mg | ORAL_CAPSULE | Freq: Two times a day (BID) | ORAL | Status: AC

## 2013-01-07 MED ORDER — LISINOPRIL-HYDROCHLOROTHIAZIDE 10-12.5 MG PO TABS: 1.0000 | ORAL_TABLET | Freq: Every day | ORAL | Status: DC
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
## 2013-01-07 NOTE — Patient Instructions (Addendum)
Use keflex antibiotic for boil Rinse with salt water Get the labs done fasting Try to get zoloft, wellbutrin, albuterol, glipzide and lisinopril/HCTZ, antibiotics F/U 1ST week May for PAP/SMEAR Physical

## 2013-01-09 DIAGNOSIS — K1370 Unspecified lesions of oral mucosa: Secondary | ICD-10-CM | POA: Insufficient documentation

## 2013-01-09 DIAGNOSIS — L0292 Furuncle, unspecified: Secondary | ICD-10-CM | POA: Insufficient documentation

## 2013-01-09 NOTE — Assessment & Plan Note (Signed)
I do not see a connection between the spot in her mouth and her headache, unfortunately can not afford topamax right now, stress also contributing, takes OTC meds as needed

## 2013-01-09 NOTE — Assessment & Plan Note (Signed)
No BP meds taken, overall looks okay,

## 2013-01-09 NOTE — Assessment & Plan Note (Signed)
Would not open at this time, antibiotics, warm compress\

## 2013-01-09 NOTE — Assessment & Plan Note (Signed)
Appears to be benign bony growth- extoses

## 2013-01-09 NOTE — Assessment & Plan Note (Signed)
On statin drug, recheck FLP

## 2013-01-09 NOTE — Assessment & Plan Note (Signed)
Well controlled last a1c 6.4%

## 2013-01-10 LAB — CBC
Platelets: 393 10*3/uL (ref 150–400)
RDW: 12.9 % (ref 11.5–15.5)
WBC: 5.5 10*3/uL (ref 4.0–10.5)

## 2013-01-10 LAB — LIPID PANEL
Cholesterol: 224 mg/dL — ABNORMAL HIGH (ref 0–200)
HDL: 54 mg/dL (ref >39)
Total CHOL/HDL Ratio: 4.1 Ratio
VLDL: 14 mg/dL (ref 0–40)

## 2013-01-10 LAB — BASIC METABOLIC PANEL
BUN: 11 mg/dL (ref 6–23)
Calcium: 9.8 mg/dL (ref 8.4–10.5)
Glucose, Bld: 120 mg/dL — ABNORMAL HIGH (ref 70–99)

## 2013-01-10 LAB — HEMOGLOBIN A1C: Mean Plasma Glucose: 134 mg/dL — ABNORMAL HIGH (ref <117)

## 2013-01-10 LAB — HIV ANTIBODY (ROUTINE TESTING W REFLEX): HIV: NONREACTIVE

## 2013-01-11 LAB — MICROALBUMIN / CREATININE URINE RATIO
Creatinine, Urine: 232.8 mg/dL
Microalb, Ur: 7.92 mg/dL — ABNORMAL HIGH (ref 0.00–1.89)

## 2013-01-11 MED ORDER — LISINOPRIL-HYDROCHLOROTHIAZIDE 20-12.5 MG PO TABS: 1.0000 | ORAL_TABLET | Freq: Every day | ORAL | Status: DC

## 2013-01-11 MED ORDER — ATORVASTATIN CALCIUM 20 MG PO TABS: 20.0000 mg | ORAL_TABLET | Freq: Every day | ORAL | Status: DC

## 2013-01-11 NOTE — Addendum Note (Signed)
Addended by: Milinda Antis F on: 01/11/2013 12:32 PM   Modules accepted: Orders

## 2013-01-17 ENCOUNTER — Other Ambulatory Visit: Payer: Self-pay

## 2013-01-17 MED ORDER — GLIPIZIDE 5 MG PO TABS: 5.0000 mg | ORAL_TABLET | Freq: Every day | ORAL | Status: DC

## 2013-03-22 ENCOUNTER — Other Ambulatory Visit: Payer: Self-pay | Admitting: Family Medicine

## 2013-03-22 DIAGNOSIS — Z139 Encounter for screening, unspecified: Secondary | ICD-10-CM

## 2013-04-01 ENCOUNTER — Ambulatory Visit (HOSPITAL_COMMUNITY)
Admission: RE | Admit: 2013-04-01 | Discharge: 2013-04-01 | Disposition: A | Discharge status: Home / Self Care | Payer: Medicaid Other | Source: Ambulatory Visit | Attending: Family Medicine | Admitting: Family Medicine

## 2013-04-01 DIAGNOSIS — Z1231 Encounter for screening mammogram for malignant neoplasm of breast: Secondary | ICD-10-CM | POA: Insufficient documentation

## 2013-04-01 DIAGNOSIS — Z139 Encounter for screening, unspecified: Secondary | ICD-10-CM

## 2013-04-14 ENCOUNTER — Ambulatory Visit: Payer: Medicaid Other | Admitting: Family Medicine

## 2013-04-15 ENCOUNTER — Other Ambulatory Visit (HOSPITAL_COMMUNITY)
Admission: RE | Admit: 2013-04-15 | Discharge: 2013-04-15 | Disposition: A | Discharge status: Home / Self Care | Payer: Medicaid Other | Source: Ambulatory Visit | Attending: Family Medicine | Admitting: Family Medicine

## 2013-04-15 ENCOUNTER — Encounter: Payer: Self-pay | Admitting: Family Medicine

## 2013-04-15 ENCOUNTER — Ambulatory Visit (INDEPENDENT_AMBULATORY_CARE_PROVIDER_SITE_OTHER): Payer: Medicaid Other | Admitting: Family Medicine

## 2013-04-15 VITALS — BP 138/82 | HR 90 | Resp 18 | Ht 62.5 in | Wt 195.0 lb

## 2013-04-15 DIAGNOSIS — N760 Acute vaginitis: Secondary | ICD-10-CM | POA: Insufficient documentation

## 2013-04-15 DIAGNOSIS — Z1212 Encounter for screening for malignant neoplasm of rectum: Secondary | ICD-10-CM

## 2013-04-15 DIAGNOSIS — J449 Chronic obstructive pulmonary disease, unspecified: Secondary | ICD-10-CM

## 2013-04-15 DIAGNOSIS — Z01419 Encounter for gynecological examination (general) (routine) without abnormal findings: Secondary | ICD-10-CM

## 2013-04-15 DIAGNOSIS — Z1211 Encounter for screening for malignant neoplasm of colon: Secondary | ICD-10-CM

## 2013-04-15 DIAGNOSIS — Z113 Encounter for screening for infections with a predominantly sexual mode of transmission: Secondary | ICD-10-CM | POA: Insufficient documentation

## 2013-04-15 DIAGNOSIS — Z124 Encounter for screening for malignant neoplasm of cervix: Secondary | ICD-10-CM

## 2013-04-15 DIAGNOSIS — E669 Obesity, unspecified: Secondary | ICD-10-CM

## 2013-04-15 DIAGNOSIS — E785 Hyperlipidemia, unspecified: Secondary | ICD-10-CM

## 2013-04-15 DIAGNOSIS — E119 Type 2 diabetes mellitus without complications: Secondary | ICD-10-CM

## 2013-04-15 LAB — LIPID PANEL
Cholesterol: 188 mg/dL (ref 0–200)
HDL: 53 mg/dL (ref >39)
LDL Cholesterol: 117 mg/dL — ABNORMAL HIGH (ref 0–99)
Total CHOL/HDL Ratio: 3.5 ratio
Triglycerides: 90 mg/dL (ref <150)
VLDL: 18 mg/dL (ref 0–40)

## 2013-04-15 LAB — HEMOGLOBIN A1C
Hgb A1c MFr Bld: 6 % — ABNORMAL HIGH (ref <5.7)
Mean Plasma Glucose: 126 mg/dL — ABNORMAL HIGH (ref <117)

## 2013-04-15 LAB — POC HEMOCCULT BLD/STL (OFFICE/1-CARD/DIAGNOSTIC): Fecal Occult Blood, POC: NEGATIVE

## 2013-04-15 NOTE — Progress Notes (Signed)
  Subjective:    Patient ID: Kayla Choi, female    DOB: January 13, 1966, 47 y.o.   MRN: [id number]  HPI    Patient here for GYN physical. Her last physical with Pap smear was done 3 years ago. She had hysterectomy secondary to tumor removal. Her mammogram is up-to-date. Her last set of fasting labs were reviewed again. She is able to get some of her medications but not all of them due to finances. Reviewed immunizations due for pneumonia shot today secondary to COPD and diabetes mellitus diagnosis.     Review of Systems   GEN- denies fatigue, fever, weight loss,weakness, recent illness HEENT- denies eye drainage, change in vision, nasal discharge, CVS- denies chest pain, palpitations RESP- denies SOB, cough, wheeze ABD- denies N/V, change in stools, abd pain GU- denies dysuria, hematuria, dribbling, incontinence MSK- denies joint pain, muscle aches, injury Neuro- denies headache, dizziness, syncope, seizure activity      Objective:   Physical Exam  GEN- NAD, alert and oriented x 3 Neck- supple, no thyromegaly Breast- normal symmetry, no nipple inversion,no nipple drainage, no nodules or lumps felt Nodes- no axillary nodes CVS- RRR, no murmur RESP-CTAB ABD-NABS,soft,NT,ND GU- normal external genitalia, vaginal mucosa pink and moist, no cervix, minimal thin discharge, ovaries not felt  Rectum- no external lesions, normal tone EXT- No edema Pulses- Radial, DP- 2+         Assessment & Plan:

## 2013-04-15 NOTE — Patient Instructions (Addendum)
Referral will be made to see if we can help with medications Continue current medications No further PAP Smears needed if normal Pneumonia Vaccine given F/U 3 months [employer]

## 2013-04-17 DIAGNOSIS — Z01419 Encounter for gynecological examination (general) (routine) without abnormal findings: Secondary | ICD-10-CM | POA: Insufficient documentation

## 2013-04-17 NOTE — Assessment & Plan Note (Signed)
PAP Smear on vaginal cuff, if neg no further PAP smear GC/Chlamydia cultures at pt request done- denies activity Will need TDAP in future Mammogram UTD

## 2013-04-17 NOTE — Assessment & Plan Note (Signed)
Often not able to afford her inhalers, has been doing well Does not qualify for medications assistance Uses albuterol rarely Pneumovax given

## 2013-04-17 NOTE — Assessment & Plan Note (Signed)
Will plan to d/c glipizide, over corrected DM with medications Let her manage with diet On ACEI and Statin drug

## 2013-04-17 NOTE — Assessment & Plan Note (Signed)
Continue to encourage weight loss, she now has subsidized help for food

## 2013-04-17 NOTE — Assessment & Plan Note (Signed)
LDL is improved, has difficulty keep medications No change to lipitor A little above goal, but not having meds consistently is cause of this

## 2013-05-06 ENCOUNTER — Encounter (HOSPITAL_COMMUNITY): Payer: Self-pay | Admitting: *Deleted

## 2013-05-06 ENCOUNTER — Emergency Department (HOSPITAL_COMMUNITY)
Admission: EM | Admit: 2013-05-06 | Discharge: 2013-05-06 | Disposition: A | Discharge status: Home / Self Care | Payer: Medicaid Other | Attending: Emergency Medicine | Admitting: Emergency Medicine

## 2013-05-06 DIAGNOSIS — F988 Other specified behavioral and emotional disorders with onset usually occurring in childhood and adolescence: Secondary | ICD-10-CM | POA: Insufficient documentation

## 2013-05-06 DIAGNOSIS — H6692 Otitis media, unspecified, left ear: Secondary | ICD-10-CM

## 2013-05-06 DIAGNOSIS — J449 Chronic obstructive pulmonary disease, unspecified: Secondary | ICD-10-CM | POA: Insufficient documentation

## 2013-05-06 DIAGNOSIS — Z862 Personal history of diseases of the blood and blood-forming organs and certain disorders involving the immune mechanism: Secondary | ICD-10-CM | POA: Insufficient documentation

## 2013-05-06 DIAGNOSIS — F329 Major depressive disorder, single episode, unspecified: Secondary | ICD-10-CM | POA: Insufficient documentation

## 2013-05-06 DIAGNOSIS — H6690 Otitis media, unspecified, unspecified ear: Secondary | ICD-10-CM | POA: Insufficient documentation

## 2013-05-06 DIAGNOSIS — F4010 Social phobia, unspecified: Secondary | ICD-10-CM | POA: Insufficient documentation

## 2013-05-06 DIAGNOSIS — H409 Unspecified glaucoma: Secondary | ICD-10-CM | POA: Insufficient documentation

## 2013-05-06 DIAGNOSIS — I10 Essential (primary) hypertension: Secondary | ICD-10-CM | POA: Insufficient documentation

## 2013-05-06 DIAGNOSIS — Z79899 Other long term (current) drug therapy: Secondary | ICD-10-CM | POA: Insufficient documentation

## 2013-05-06 DIAGNOSIS — Z91040 Latex allergy status: Secondary | ICD-10-CM | POA: Insufficient documentation

## 2013-05-06 DIAGNOSIS — F411 Generalized anxiety disorder: Secondary | ICD-10-CM | POA: Insufficient documentation

## 2013-05-06 DIAGNOSIS — E119 Type 2 diabetes mellitus without complications: Secondary | ICD-10-CM | POA: Insufficient documentation

## 2013-05-06 DIAGNOSIS — E785 Hyperlipidemia, unspecified: Secondary | ICD-10-CM | POA: Insufficient documentation

## 2013-05-06 DIAGNOSIS — IMO0002 Reserved for concepts with insufficient information to code with codable children: Secondary | ICD-10-CM | POA: Insufficient documentation

## 2013-05-06 DIAGNOSIS — J4489 Other specified chronic obstructive pulmonary disease: Secondary | ICD-10-CM | POA: Insufficient documentation

## 2013-05-06 DIAGNOSIS — F3289 Other specified depressive episodes: Secondary | ICD-10-CM | POA: Insufficient documentation

## 2013-05-06 MED ORDER — ANTIPYRINE-BENZOCAINE 5.4-1.4 % OT SOLN
3.0000 [drp] | Freq: Once | OTIC | Status: AC
  Administered 2013-05-06: 3 [drp] via OTIC
  Filled 2013-05-06: qty 10

## 2013-05-06 MED ORDER — AMOXICILLIN 250 MG PO CAPS
500.0000 mg | ORAL_CAPSULE | Freq: Once | ORAL | Status: AC
  Administered 2013-05-06: 500 mg via ORAL
  Filled 2013-05-06: qty 2

## 2013-05-06 MED ORDER — LORATADINE 10 MG PO TABS: 10.0000 mg | ORAL_TABLET | Freq: Every day | ORAL | Status: DC

## 2013-05-06 MED ORDER — AMOXICILLIN 500 MG PO CAPS: 500.0000 mg | ORAL_CAPSULE | Freq: Three times a day (TID) | ORAL | Status: DC

## 2013-05-06 NOTE — ED Provider Notes (Signed)
History     CSN: [id number]  Arrival date & time 05/06/13  2213   First MD Initiated Contact with Patient 05/06/13 2253      Chief Complaint  Patient presents with  . Otalgia    (Consider location/radiation/quality/duration/timing/severity/associated sxs/prior treatment) HPI  Kayla Choi is a 47 y.o. female who presents to the ED with ear pain. The pain started a few days ago. It feels like something in the ear. The pain is located in the left ear. Today the pain goes to the left side of the neck . No fever or chills no other symptoms. The history was provided by the patient.   Past Medical History  Diagnosis Date  . Hypertension   . COPD (chronic obstructive pulmonary disease)   . Hyperlipidemia   . Prediabetes   . Anxiety     with social phobia  . Anemia     iron deficinecy  . Depression   . ADD (attention deficit disorder)   . Glaucoma   . Diabetes mellitus   . Social phobia     Past Surgical History  Procedure Laterality Date  . Cholecystectomy    . Right elbow      reconstruction  . Abdominal hysterectomy      fibroids, both ovaries left intact  . Cosmetic surgery      elbow  . Fracture surgery      Recurrent elbow surgery  . Lipoma excision  04/30/2012    Procedure: EXCISION LIPOMA;  Surgeon: Fabio Bering, MD;  Location: [facility];  Service: General;  Laterality: Right;  Excision soft tissue mass right thigh    Family History  Problem Relation Age of Onset  . Hypertension Mother   . Diabetes Father   . Heart disease Father   . Anesthesia problems Neg Hx   . Malignant hyperthermia Neg Hx   . Hypotension Neg Hx   . Pseudochol deficiency Neg Hx     History  Substance Use Topics  . Smoking status: Never Smoker   . Smokeless tobacco: Not on file  . Alcohol Use: No    OB History   Grav Para Term Preterm Abortions TAB SAB Ect Mult Living                  Review of Systems  Constitutional: Negative for fever and chills.  HENT: Positive for  ear pain. Negative for trouble swallowing and sinus pressure.   Respiratory: Negative for cough.   Cardiovascular: Negative for chest pain.  Gastrointestinal: Negative for nausea and vomiting.  Skin: Negative for rash.  Neurological: Negative for headaches.  Psychiatric/Behavioral: The patient is not nervous/anxious.     Allergies  Aspirin; Metformin and related; Other; Latex; and Neosporin  Home Medications   Current Outpatient Rx  Name  Route  Sig  Dispense  Refill  . albuterol (PROVENTIL HFA;VENTOLIN HFA) 108 (90 BASE) MCG/ACT inhaler   Inhalation   Inhale 2 puffs into the lungs every 6 (six) hours as needed for wheezing or shortness of breath.         Marland Kitchen atorvastatin (LIPITOR) 20 MG tablet   Oral   Take 20 mg by mouth every morning.         . budesonide-formoterol (SYMBICORT) 160-4.5 MCG/ACT inhaler   Inhalation   Inhale 2 puffs into the lungs 2 (two) times daily.           Marland Kitchen tiotropium (SPIRIVA) 18 MCG inhalation capsule   Inhalation   Place 18  mcg into inhaler and inhale daily.           Marland Kitchen buPROPion (WELLBUTRIN XL) 300 MG 24 hr tablet   Oral   Take 300 mg by mouth daily.           . fluticasone (FLONASE) 50 MCG/ACT nasal spray   Nasal   Place 2 sprays into the nose daily.           Marland Kitchen lisinopril-hydrochlorothiazide (ZESTORETIC) 20-12.5 MG per tablet   Oral   Take 1 tablet by mouth daily.   30 tablet   3     New dose  Of lisinopril   . loratadine (CLARITIN) 10 MG tablet   Oral   Take 10 mg by mouth daily.           Marland Kitchen LORazepam (ATIVAN) 0.5 MG tablet   Oral   Take 0.5 mg by mouth at bedtime as needed. Sleep/anxiety         . sertraline (ZOLOFT) 100 MG tablet   Oral   Take 100 mg by mouth daily.           Marland Kitchen topiramate (TOPAMAX) 25 MG tablet   Oral   Take 1 tablet (25 mg total) by mouth at bedtime.   30 tablet   2     BP 165/87  Pulse 98  Temp(Src) 98.5 F (36.9 C) (Oral)  Resp 18  Ht 5\' 1"  (1.549 m)  Wt 195 lb (88.451 kg)   BMI 36.86 kg/m2  SpO2 99%  Physical Exam  Nursing note and vitals reviewed. Constitutional: She is oriented to person, place, and time. She appears well-developed and well-nourished. No distress.  HENT:  Head: Normocephalic.  Right Ear: Tympanic membrane normal.  Left Ear: There is swelling. Tympanic membrane is erythematous and retracted.  Nose: Nose normal.  Mouth/Throat: Uvula is midline, oropharynx is clear and moist and mucous membranes are normal.  Eyes: Conjunctivae and EOM are normal. Pupils are equal, round, and reactive to light.  Neck: Neck supple.  Cardiovascular: Normal rate and regular rhythm.   Pulmonary/Chest: Effort normal and breath sounds normal.  Abdominal: Soft. There is no tenderness.  Musculoskeletal: Normal range of motion.  Neurological: She is alert and oriented to person, place, and time. No cranial nerve deficit.  Skin: Skin is warm and dry.  Psychiatric: She has a normal mood and affect. Her behavior is normal.    ED Course  Procedures (including critical care time)  MDM  47 y.o. female with left ear pain. Otitis media left I have reviewed this patient's vital signs, nurses notes, discussed clinical findings and plan of care with the patient and she voices understanding.    Medication List    TAKE these medications       amoxicillin 500 MG capsule  Commonly known as:  AMOXIL  Take 1 capsule (500 mg total) by mouth 3 (three) times daily.     loratadine 10 MG tablet  Commonly known as:  CLARITIN  Take 1 tablet (10 mg total) by mouth daily.      ASK your doctor about these medications       albuterol 108 (90 BASE) MCG/ACT inhaler  Commonly known as:  PROVENTIL HFA;VENTOLIN HFA  Inhale 2 puffs into the lungs every 6 (six) hours as needed for wheezing or shortness of breath.     atorvastatin 20 MG tablet  Commonly known as:  LIPITOR  Take 20 mg by mouth every morning.     budesonide-formoterol  160-4.5 MCG/ACT inhaler  Commonly known as:   SYMBICORT  Inhale 2 puffs into the lungs 2 (two) times daily.     buPROPion 300 MG 24 hr tablet  Commonly known as:  WELLBUTRIN XL  Take 300 mg by mouth daily.     fluticasone 50 MCG/ACT nasal spray  Commonly known as:  FLONASE  Place 2 sprays into the nose daily.     lisinopril-hydrochlorothiazide 20-12.5 MG per tablet  Commonly known as:  ZESTORETIC  Take 1 tablet by mouth daily.     LORazepam 0.5 MG tablet  Commonly known as:  ATIVAN  Take 0.5 mg by mouth at bedtime as needed. Sleep/anxiety     sertraline 100 MG tablet  Commonly known as:  ZOLOFT  Take 100 mg by mouth daily.     tiotropium 18 MCG inhalation capsule  Commonly known as:  SPIRIVA  Place 18 mcg into inhaler and inhale daily.     topiramate 25 MG tablet  Commonly known as:  TOPAMAX  Take 1 tablet (25 mg total) by mouth at bedtime.             Janne Napoleon, [place] 05/06/13 [phone]

## 2013-05-06 NOTE — ED Provider Notes (Signed)
Medical screening examination/treatment/procedure(s) were performed by non-physician practitioner and as supervising physician I was immediately available for consultation/collaboration.   Demitrios Molyneux W. Chloeanne Poteet, MD 05/06/13 2318 

## 2013-05-06 NOTE — ED Notes (Signed)
Pain lt ear, thinks may be a bug

## 2013-05-10 ENCOUNTER — Encounter (HOSPITAL_COMMUNITY): Payer: Self-pay | Admitting: *Deleted

## 2013-05-10 ENCOUNTER — Emergency Department (HOSPITAL_COMMUNITY): Payer: Medicaid Other

## 2013-05-10 ENCOUNTER — Telehealth: Payer: Self-pay | Admitting: Family Medicine

## 2013-05-10 ENCOUNTER — Emergency Department (HOSPITAL_COMMUNITY)
Admission: EM | Admit: 2013-05-10 | Discharge: 2013-05-11 | Disposition: A | Discharge status: Home / Self Care | Payer: Medicaid Other | Attending: Emergency Medicine | Admitting: Emergency Medicine

## 2013-05-10 DIAGNOSIS — Z8659 Personal history of other mental and behavioral disorders: Secondary | ICD-10-CM | POA: Insufficient documentation

## 2013-05-10 DIAGNOSIS — I10 Essential (primary) hypertension: Secondary | ICD-10-CM | POA: Insufficient documentation

## 2013-05-10 DIAGNOSIS — F411 Generalized anxiety disorder: Secondary | ICD-10-CM | POA: Insufficient documentation

## 2013-05-10 DIAGNOSIS — J441 Chronic obstructive pulmonary disease with (acute) exacerbation: Secondary | ICD-10-CM | POA: Insufficient documentation

## 2013-05-10 DIAGNOSIS — R42 Dizziness and giddiness: Secondary | ICD-10-CM | POA: Insufficient documentation

## 2013-05-10 DIAGNOSIS — H409 Unspecified glaucoma: Secondary | ICD-10-CM | POA: Insufficient documentation

## 2013-05-10 DIAGNOSIS — F430 Acute stress reaction: Secondary | ICD-10-CM | POA: Insufficient documentation

## 2013-05-10 DIAGNOSIS — R109 Unspecified abdominal pain: Secondary | ICD-10-CM | POA: Insufficient documentation

## 2013-05-10 DIAGNOSIS — Z9089 Acquired absence of other organs: Secondary | ICD-10-CM | POA: Insufficient documentation

## 2013-05-10 DIAGNOSIS — E785 Hyperlipidemia, unspecified: Secondary | ICD-10-CM | POA: Insufficient documentation

## 2013-05-10 DIAGNOSIS — R197 Diarrhea, unspecified: Secondary | ICD-10-CM | POA: Insufficient documentation

## 2013-05-10 DIAGNOSIS — F329 Major depressive disorder, single episode, unspecified: Secondary | ICD-10-CM | POA: Insufficient documentation

## 2013-05-10 DIAGNOSIS — Z90710 Acquired absence of both cervix and uterus: Secondary | ICD-10-CM | POA: Insufficient documentation

## 2013-05-10 DIAGNOSIS — IMO0002 Reserved for concepts with insufficient information to code with codable children: Secondary | ICD-10-CM | POA: Insufficient documentation

## 2013-05-10 DIAGNOSIS — Z79899 Other long term (current) drug therapy: Secondary | ICD-10-CM | POA: Insufficient documentation

## 2013-05-10 DIAGNOSIS — R112 Nausea with vomiting, unspecified: Secondary | ICD-10-CM | POA: Insufficient documentation

## 2013-05-10 DIAGNOSIS — M2550 Pain in unspecified joint: Secondary | ICD-10-CM | POA: Insufficient documentation

## 2013-05-10 DIAGNOSIS — Z862 Personal history of diseases of the blood and blood-forming organs and certain disorders involving the immune mechanism: Secondary | ICD-10-CM | POA: Insufficient documentation

## 2013-05-10 DIAGNOSIS — Z91040 Latex allergy status: Secondary | ICD-10-CM | POA: Insufficient documentation

## 2013-05-10 DIAGNOSIS — F419 Anxiety disorder, unspecified: Secondary | ICD-10-CM

## 2013-05-10 DIAGNOSIS — Z8639 Personal history of other endocrine, nutritional and metabolic disease: Secondary | ICD-10-CM | POA: Insufficient documentation

## 2013-05-10 DIAGNOSIS — E119 Type 2 diabetes mellitus without complications: Secondary | ICD-10-CM | POA: Insufficient documentation

## 2013-05-10 DIAGNOSIS — F3289 Other specified depressive episodes: Secondary | ICD-10-CM | POA: Insufficient documentation

## 2013-05-10 DIAGNOSIS — R6884 Jaw pain: Secondary | ICD-10-CM | POA: Insufficient documentation

## 2013-05-10 DIAGNOSIS — R0789 Other chest pain: Secondary | ICD-10-CM | POA: Insufficient documentation

## 2013-05-10 DIAGNOSIS — R51 Headache: Secondary | ICD-10-CM | POA: Insufficient documentation

## 2013-05-10 DIAGNOSIS — F988 Other specified behavioral and emotional disorders with onset usually occurring in childhood and adolescence: Secondary | ICD-10-CM | POA: Insufficient documentation

## 2013-05-10 LAB — BASIC METABOLIC PANEL
BUN: 8 mg/dL (ref 6–23)
Chloride: 98 mEq/L (ref 96–112)
Creatinine, Ser: 0.72 mg/dL (ref 0.50–1.10)
GFR calc Af Amer: >90 mL/min (ref >90)
Glucose, Bld: 144 mg/dL — ABNORMAL HIGH (ref 70–99)

## 2013-05-10 LAB — CBC
HCT: 41.3 % (ref 36.0–46.0)
MCH: 31.7 pg (ref 26.0–34.0)
MCHC: 34.1 g/dL (ref 30.0–36.0)
RDW: 12.8 % (ref 11.5–15.5)

## 2013-05-10 MED ORDER — DIPHENHYDRAMINE HCL 50 MG/ML IJ SOLN
25.0000 mg | Freq: Once | INTRAMUSCULAR | Status: AC
  Administered 2013-05-10: 25 mg via INTRAVENOUS
  Filled 2013-05-10: qty 1

## 2013-05-10 MED ORDER — ANTIPYRINE-BENZOCAINE 5.4-1.4 % OT SOLN: 3.0000 [drp] | Freq: Four times a day (QID) | OTIC | Status: DC | PRN

## 2013-05-10 MED ORDER — TRAMADOL HCL 50 MG PO TABS
100.0000 mg | ORAL_TABLET | Freq: Once | ORAL | Status: AC
  Administered 2013-05-10: 100 mg via ORAL
  Filled 2013-05-10: qty 2

## 2013-05-10 MED ORDER — METOCLOPRAMIDE HCL 5 MG/ML IJ SOLN
10.0000 mg | Freq: Once | INTRAMUSCULAR | Status: AC
  Administered 2013-05-10: 10 mg via INTRAVENOUS
  Filled 2013-05-10: qty 2

## 2013-05-10 MED ORDER — ONDANSETRON HCL 4 MG/2ML IJ SOLN
4.0000 mg | Freq: Once | INTRAMUSCULAR | Status: AC
  Administered 2013-05-10: 4 mg via INTRAVENOUS
  Filled 2013-05-10: qty 2

## 2013-05-10 MED ORDER — LORAZEPAM 2 MG/ML IJ SOLN
1.0000 mg | Freq: Once | INTRAMUSCULAR | Status: AC
  Administered 2013-05-10: 1 mg via INTRAVENOUS
  Filled 2013-05-10: qty 1

## 2013-05-10 NOTE — ED Notes (Signed)
Pt reporting chest pain and SOB starting last night.  Reporting productive cough.

## 2013-05-10 NOTE — ED Provider Notes (Signed)
History     This chart was scribed for Ward Givens, MD, MD by Smitty Pluck, ED Scribe. The patient was seen in room [facility] and the patient's care was started at 10:28 PM.   CSN: [id number]  Arrival date & time 05/10/13  2024    Chief Complaint  Patient presents with  . Chest Pain      Patient is a 47 y.o. female presenting with chest pain. The history is provided by the patient and medical records. No language interpreter was used.  Chest Pain Associated symptoms: abdominal pain, dizziness, headache, nausea, shortness of breath and vomiting    HPI Comments: Kayla Choi is a 47 y.o. 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 Pt states the pain awoke her from sleeping. She mentions when she opens her mouth she has relief of pain. She states that talking aggravates the pain. She mentions having vomiting 3x and diarrhea 1x today. She mentions having intermittent, pressure substernal chest pain onset at same time jaw pain began. Lying down relieves chest pain. The chest pain lasts 2-3 minutes and is worsening. She states she noticed sharp chest pains when she has stress (unhealthy relationship with Landlord). She reports having mild, dull periumbilical pain, dizziness, HA and SOB. She reports hx of similar symptoms but is unsure of cause. Pt denies radiation of pain, diaphoresis, fever, chills, weakness, cough and any other pain.  She states both her arms are numb.   PCP is Dr. Jeanice Lim   She reports family hx pf heart complications. Her brother has stents (24 y.o).    Past Medical History  Diagnosis Date  . Hypertension   . COPD (chronic obstructive pulmonary disease)   . Hyperlipidemia   . Prediabetes   . Anxiety     with social phobia  . Anemia     iron deficinecy  . Depression   . ADD (attention deficit disorder)   . Glaucoma   . Diabetes  mellitus   . Social phobia     Past Surgical History  Procedure Laterality Date  . Cholecystectomy    . Right elbow      reconstruction  . Abdominal hysterectomy      fibroids, both ovaries left intact  . Cosmetic surgery      elbow  . Fracture surgery      Recurrent elbow surgery  . Lipoma excision  04/30/2012    Procedure: EXCISION LIPOMA;  Surgeon: Fabio Bering, MD;  Location: [facility];  Service: General;  Laterality: Right;  Excision soft tissue mass right thigh    Family History  Problem Relation Age of Onset  . Hypertension Mother   . Diabetes Father   . Heart disease Father   . Anesthesia problems Neg Hx   . Malignant hyperthermia Neg Hx   . Hypotension Neg Hx   . Pseudochol deficiency Neg Hx     History  Substance Use Topics  . Smoking status: Never Smoker   . Smokeless tobacco: Not on file  . Alcohol Use: No  on disabiltiy for anxiety  OB History   Grav Para Term Preterm Abortions TAB SAB Ect Mult Living                  Review of Systems  Respiratory: Positive for shortness of breath.   Cardiovascular: Positive for chest pain.  Gastrointestinal: Positive for  nausea, vomiting, abdominal pain and diarrhea.  Musculoskeletal: Positive for arthralgias.  Neurological: Positive for dizziness and headaches.  All other systems reviewed and are negative.    Allergies  Aspirin; Metformin and related; Other; Latex; and Neosporin  Home Medications   Current Outpatient Rx  Name  Route  Sig  Dispense  Refill  . albuterol (PROVENTIL HFA;VENTOLIN HFA) 108 (90 BASE) MCG/ACT inhaler   Inhalation   Inhale 2 puffs into the lungs every 6 (six) hours as needed for wheezing or shortness of breath.         Marland Kitchen amoxicillin (AMOXIL) 500 MG capsule   Oral   Take 1 capsule (500 mg total) by mouth 3 (three) times daily.   30 capsule   0   . antipyrine-benzocaine (AURALGAN) otic solution   Otic   Place 3 drops in ear(s) 4 (four) times daily as needed for pain.   10  mL   0   . atorvastatin (LIPITOR) 20 MG tablet   Oral   Take 20 mg by mouth every morning.         . budesonide-formoterol (SYMBICORT) 160-4.5 MCG/ACT inhaler   Inhalation   Inhale 2 puffs into the lungs 2 (two) times daily.           Marland Kitchen buPROPion (WELLBUTRIN XL) 300 MG 24 hr tablet   Oral   Take 300 mg by mouth daily.           . fluticasone (FLONASE) 50 MCG/ACT nasal spray   Nasal   Place 2 sprays into the nose daily.           Marland Kitchen lisinopril-hydrochlorothiazide (ZESTORETIC) 20-12.5 MG per tablet   Oral   Take 1 tablet by mouth daily.   30 tablet   3     New dose  Of lisinopril   . loratadine (CLARITIN) 10 MG tablet   Oral   Take 1 tablet (10 mg total) by mouth daily.   14 tablet   0   . LORazepam (ATIVAN) 0.5 MG tablet   Oral   Take 0.5 mg by mouth daily as needed. Sleep/anxiety         . sertraline (ZOLOFT) 100 MG tablet   Oral   Take 100 mg by mouth daily.           Marland Kitchen tiotropium (SPIRIVA) 18 MCG inhalation capsule   Inhalation   Place 18 mcg into inhaler and inhale daily.           Marland Kitchen topiramate (TOPAMAX) 25 MG tablet   Oral   Take 1 tablet (25 mg total) by mouth at bedtime.   30 tablet   2     BP 182/104  Pulse 117  Temp(Src) 98.8 F (37.1 C) (Oral)  Resp 20  Ht 5\' 1"  (1.549 m)  Wt 192 lb (87.091 kg)  BMI 36.3 kg/m2  SpO2 99%  Vital signs normal except hypertension, tachycardia   Physical Exam  Nursing note and vitals reviewed. Constitutional: She is oriented to person, place, and time. She appears well-developed and well-nourished.  Non-toxic appearance. She does not appear ill. No distress.  HENT:  Head: Normocephalic and atraumatic.  Right Ear: External ear normal.  Left Ear: External ear normal.  Nose: Nose normal. No mucosal edema or rhinorrhea.  Mouth/Throat: Oropharynx is clear and moist and mucous membranes are normal. No dental abscesses or edematous.  Eyes: Conjunctivae and EOM are normal. Pupils are equal, round,  and reactive to light.  Neck:  Normal range of motion and full passive range of motion without pain. Neck supple.  Cardiovascular: Normal rate, regular rhythm and normal heart sounds.  Exam reveals no gallop and no friction rub.   No murmur heard. Pulmonary/Chest: Effort normal and breath sounds normal. No respiratory distress. She has no wheezes. She has no rhonchi. She has no rales. She exhibits tenderness (lower central chest). She exhibits no crepitus.    Abdominal: Soft. Normal appearance and bowel sounds are normal. She exhibits no distension. There is no tenderness. There is no rebound and no guarding.  Musculoskeletal: Normal range of motion. She exhibits no edema and no tenderness.  Moves all extremities well.   Neurological: She is alert and oriented to person, place, and time. She has normal strength. No cranial nerve deficit.  Skin: Skin is warm, dry and intact. No rash noted. No erythema. No pallor.  Psychiatric: She has a normal mood and affect. Her speech is normal and behavior is normal. Her mood appears not anxious.    ED Course  Procedures (including critical care time)  Medications  LORazepam (ATIVAN) injection 1 mg (1 mg Intravenous Given 05/10/13 2310)  traMADol (ULTRAM) tablet 100 mg (100 mg Oral Given 05/10/13 2310)  ondansetron (ZOFRAN) injection 4 mg (4 mg Intravenous Given 05/10/13 2311)  metoCLOPramide (REGLAN) injection 10 mg (10 mg Intravenous Given 05/10/13 2311)  diphenhydrAMINE (BENADRYL) injection 25 mg (25 mg Intravenous Given 05/10/13 2311)    DIAGNOSTIC STUDIES: Oxygen Saturation is 99% on room air, normal by my interpretation.    COORDINATION OF CARE: 10:38 PM Discussed ED treatment with pt and pt agrees.   Pt states she feels better after her medications.   Results for orders placed during the hospital encounter of 05/10/13  BASIC METABOLIC PANEL      Result Value Range   Sodium 134 (*) 135 - 145 mEq/L   Potassium 3.5  3.5 - 5.1 mEq/L   Chloride 98   96 - 112 mEq/L   CO2 25  19 - 32 mEq/L   Glucose, Bld 144 (*) 70 - 99 mg/dL   BUN 8  6 - 23 mg/dL   Creatinine, Ser 1.61  0.50 - 1.10 mg/dL   Calcium 9.2  8.4 - 09.6 mg/dL   GFR calc non Af Amer >90  >90 mL/min   GFR calc Af Amer >90  >90 mL/min  CBC      Result Value Range   WBC 9.3  4.0 - 10.5 K/uL   RBC 4.45  3.87 - 5.11 MIL/uL   Hemoglobin 14.1  12.0 - 15.0 g/dL   HCT 04.5  40.9 - 81.1 %   MCV 92.8  78.0 - 100.0 fL   MCH 31.7  26.0 - 34.0 pg   MCHC 34.1  30.0 - 36.0 g/dL   RDW 91.4  78.2 - 95.6 %   Platelets 354  150 - 400 K/uL  TROPONIN I      Result Value Range   Troponin I <0.30  <0.30 ng/mL   Laboratory interpretation all normal    Dg Chest Portable 1 View  05/10/2013   *RADIOLOGY REPORT*  Clinical Data: Chest pain.  History of hypertension and chronic obstructive pulmonary disease.  PORTABLE CHEST - 1 VIEW  Comparison: 02/11/2012.  Findings: Normal heart size with clear lung fields.  No bony abnormality.  IMPRESSION: No active cardiopulmonary disease.  No change from priors.   Original Report Authenticated By: Davonna Belling, M.D.    Date: 05/11/2013  Rate: 113  Rhythm: sinus tachycardia  QRS Axis: normal  Intervals: normal  ST/T Wave abnormalities: normal  Conduction Disutrbances:none  Narrative Interpretation: PRWP  Old EKG Reviewed: changes noted from 04/16/2012 except rate was 82    1. Atypical chest pain   2. Anxiety    Discharge Medication List as of 05/11/2013 12:10 AM    START taking these medications   Details  cyclobenzaprine (FLEXERIL) 5 MG tablet Take 1 tablet (5 mg total) by mouth 3 (three) times daily as needed for muscle spasms., Starting 05/11/2013, Until Discontinued, Print    !! LORazepam (ATIVAN) 0.5 MG tablet Take 2 tablets (1 mg total) by mouth 3 (three) times daily as needed for anxiety., Starting 05/11/2013, Until Discontinued, Print     !! - Potential duplicate medications found. Please discuss with provider.      Plan discharge  Devoria Albe, MD, FACEP    MDM    I personally performed the services described in this documentation, which was scribed in my presence. The recorded information has been reviewed and considered.  Devoria Albe, MD, FACEP    Ward Givens, MD 05/11/13 [phone]

## 2013-05-10 NOTE — Telephone Encounter (Signed)
Pt was informed that her RX is at pharmacy

## 2013-05-10 NOTE — Telephone Encounter (Signed)
Prescription written for AB/OTIC ear drops for pain use as directed, if no improvement come in to be seen

## 2013-05-11 MED ORDER — CYCLOBENZAPRINE HCL 5 MG PO TABS: 5.0000 mg | ORAL_TABLET | Freq: Three times a day (TID) | ORAL | Status: DC | PRN

## 2013-05-11 MED ORDER — LORAZEPAM 0.5 MG PO TABS: 1.0000 mg | ORAL_TABLET | Freq: Three times a day (TID) | ORAL | Status: DC | PRN

## 2013-05-17 ENCOUNTER — Ambulatory Visit (INDEPENDENT_AMBULATORY_CARE_PROVIDER_SITE_OTHER): Payer: Medicaid Other | Admitting: Family Medicine

## 2013-05-17 VITALS — BP 130/80 | HR 78 | Temp 98.1°F | Resp 16 | Wt 197.0 lb

## 2013-05-17 DIAGNOSIS — J069 Acute upper respiratory infection, unspecified: Secondary | ICD-10-CM

## 2013-05-17 DIAGNOSIS — J449 Chronic obstructive pulmonary disease, unspecified: Secondary | ICD-10-CM

## 2013-05-17 DIAGNOSIS — J4489 Other specified chronic obstructive pulmonary disease: Secondary | ICD-10-CM

## 2013-05-17 DIAGNOSIS — H6690 Otitis media, unspecified, unspecified ear: Secondary | ICD-10-CM

## 2013-05-17 MED ORDER — PREDNISONE 10 MG PO TABS: ORAL_TABLET | ORAL | Status: DC

## 2013-05-17 NOTE — Patient Instructions (Addendum)
Complete the antibiotics as prescribed Take the prednisone Use your inhalers - albuterol every 4 hours as needed Mucinex for congestion and cough F/U as previous

## 2013-05-19 ENCOUNTER — Encounter: Payer: Self-pay | Admitting: Family Medicine

## 2013-05-19 DIAGNOSIS — J069 Acute upper respiratory infection, unspecified: Secondary | ICD-10-CM | POA: Insufficient documentation

## 2013-05-19 DIAGNOSIS — H6690 Otitis media, unspecified, unspecified ear: Secondary | ICD-10-CM | POA: Insufficient documentation

## 2013-05-19 NOTE — Assessment & Plan Note (Addendum)
CXR neg, on antibiotics due to ear infection, will add prednisone due to chronic bronchitis history see if this helps

## 2013-05-19 NOTE — Assessment & Plan Note (Signed)
Complete antibiotics no sign of infection today, AB/Otic for ear discomfort

## 2013-05-19 NOTE — Progress Notes (Signed)
  Subjective:    Patient ID: Kayla Choi, female    DOB: [birth year], 47 y.o.   MRN: [id number]  HPI  Pt here with continued cough with production, occasional wheezing, ear pain. Seen in ED x 2, diagnosed with OM currently on antibiotics on1st visit, diagnosed with atypical chest pain and anxiety on 2nd visit. Has had post-tussive emesis on a few occasions. No recent fever. No change in stools.   Review of Systems  GEN-+ fatigue, fever, weight loss,weakness, recent illness HEENT- denies eye drainage, change in vision, nasal discharge, CVS- denies chest pain, palpitations RESP- denies SOB, +cough, wheeze ABD- +N/V, change in stools, abd pain Neuro- denies headache, dizziness, syncope, seizure activity      Objective:   Physical Exam   GEN- NAD, alert and oriented x3 HEENT- PERRL, EOMI, non injected sclera, pink conjunctiva, MMM, oropharynx clear,TM clear bilat no effusion, + maxillary sinus tenderness, nares clear Neck- Supple, no LAD CVS- RRR, no murmur RESP-course BS, upper airway congestion, no wheeze, no rales ABD-NABS,soft,NT,ND EXT- No edema Pulses- Radial 2+        Assessment & Plan:

## 2013-05-19 NOTE — Assessment & Plan Note (Signed)
Does not fit true exacerbation, low dose prednisone prolonged symptoms

## 2013-06-16 ENCOUNTER — Other Ambulatory Visit: Payer: Self-pay

## 2013-07-13 NOTE — Telephone Encounter (Signed)
error 

## 2013-07-20 ENCOUNTER — Ambulatory Visit (INDEPENDENT_AMBULATORY_CARE_PROVIDER_SITE_OTHER): Payer: Medicaid Other | Admitting: Family Medicine

## 2013-07-20 ENCOUNTER — Encounter: Payer: Self-pay | Admitting: Family Medicine

## 2013-07-20 VITALS — BP 118/82 | HR 80 | Temp 98.0°F | Resp 16 | Wt 199.0 lb

## 2013-07-20 DIAGNOSIS — E119 Type 2 diabetes mellitus without complications: Secondary | ICD-10-CM

## 2013-07-20 DIAGNOSIS — F411 Generalized anxiety disorder: Secondary | ICD-10-CM

## 2013-07-20 DIAGNOSIS — I10 Essential (primary) hypertension: Secondary | ICD-10-CM

## 2013-07-20 DIAGNOSIS — R109 Unspecified abdominal pain: Secondary | ICD-10-CM

## 2013-07-20 DIAGNOSIS — J449 Chronic obstructive pulmonary disease, unspecified: Secondary | ICD-10-CM

## 2013-07-20 DIAGNOSIS — J4489 Other specified chronic obstructive pulmonary disease: Secondary | ICD-10-CM

## 2013-07-20 LAB — CBC WITH DIFFERENTIAL/PLATELET
Eosinophils Absolute: 0.1 10*3/uL (ref 0.0–0.7)
Eosinophils Relative: 1 % (ref 0–5)
HCT: 39.7 % (ref 36.0–46.0)
Hemoglobin: 13.3 g/dL (ref 12.0–15.0)
Lymphocytes Relative: 36 % (ref 12–46)
Lymphs Abs: 2.2 10*3/uL (ref 0.7–4.0)
MCH: 30.6 pg (ref 26.0–34.0)
MCV: 91.3 fL (ref 78.0–100.0)
Monocytes Relative: 7 % (ref 3–12)
RBC: 4.35 MIL/uL (ref 3.87–5.11)
WBC: 6.2 10*3/uL (ref 4.0–10.5)

## 2013-07-20 MED ORDER — LISINOPRIL-HYDROCHLOROTHIAZIDE 20-12.5 MG PO TABS: 1.0000 | ORAL_TABLET | Freq: Every day | ORAL | Status: DC

## 2013-07-20 MED ORDER — BUPROPION HCL ER (XL) 300 MG PO TB24: 300.0000 mg | ORAL_TABLET | Freq: Every day | ORAL | Status: DC

## 2013-07-20 MED ORDER — ATORVASTATIN CALCIUM 20 MG PO TABS: 20.0000 mg | ORAL_TABLET | Freq: Every morning | ORAL | Status: DC

## 2013-07-20 MED ORDER — TIOTROPIUM BROMIDE MONOHYDRATE 18 MCG IN CAPS: 18.0000 ug | ORAL_CAPSULE | Freq: Every day | RESPIRATORY_TRACT | Status: DC

## 2013-07-20 MED ORDER — BUDESONIDE-FORMOTEROL FUMARATE 160-4.5 MCG/ACT IN AERO: 2.0000 | INHALATION_SPRAY | Freq: Two times a day (BID) | RESPIRATORY_TRACT | Status: DC

## 2013-07-20 MED ORDER — LORAZEPAM 0.5 MG PO TABS: 0.5000 mg | ORAL_TABLET | Freq: Four times a day (QID) | ORAL | Status: DC | PRN

## 2013-07-20 MED ORDER — ALBUTEROL SULFATE HFA 108 (90 BASE) MCG/ACT IN AERS: 2.0000 | INHALATION_SPRAY | Freq: Four times a day (QID) | RESPIRATORY_TRACT | Status: DC | PRN

## 2013-07-20 MED ORDER — SERTRALINE HCL 100 MG PO TABS: 100.0000 mg | ORAL_TABLET | Freq: Every day | ORAL | Status: DC

## 2013-07-20 NOTE — Assessment & Plan Note (Signed)
I think her anxiety is the cause of her recurrent chest pain difficulty breathing abdominal pains. She often goes to the ER with negative workup. I would try her on low-dose lorazepam along with her other medications being prescribed by psychiatry. I will refill her Zoloft and Wellbutrin because she has no refills at the pharmacy and has not heard back from her psychiatric office

## 2013-07-20 NOTE — Assessment & Plan Note (Signed)
Diet controlled check A1c 

## 2013-07-20 NOTE — Patient Instructions (Addendum)
We will call with lab results Drink Gingerale  Take the anxiety pills for your nerves  F/U 3 months

## 2013-07-20 NOTE — Assessment & Plan Note (Signed)
She declines any antiemetics today. I will draw CBC seen at and lipase on her. No evidence of acute abdomen She is status post cholecystectomy

## 2013-07-20 NOTE — Addendum Note (Signed)
Addended by: Milinda Antis F on: 07/20/2013 06:11 PM   Modules accepted: Orders

## 2013-07-20 NOTE — Assessment & Plan Note (Signed)
Blood pressure looks good today continue current medication

## 2013-07-20 NOTE — Assessment & Plan Note (Signed)
She has all of her inhalers today. Her breathing has improved with the use. In the near future will need a repeat pulmonary function tests.

## 2013-07-20 NOTE — Progress Notes (Signed)
  Subjective:    Patient ID: Kayla Choi, female    DOB: 1966-11-05, 47 y.o.   MRN: [id number]  HPI Patient here to follow chronic medical problems. She's had nausea and vomiting NB/NB for the past 2 days. and some epigastric pain. Denies diarrhea. She denies any sick contacts. This morning she had a small amount of vomiting she's had none since and her pain has improved. She continues to have difficulty with her anxiety but she is follow with psychiatry. She always social worker breath and has chest pains all the time. She's been trying to walk some but states that makes her short of breath in too tired to do so Medications reviewed   Review of Systems  GEN- denies fatigue, fever, weight loss,weakness, recent illness HEENT- denies eye drainage, change in vision, nasal discharge, CVS- denies chest pain, palpitations RESP- denies SOB, cough, wheeze ABD- + N/V, change in stools, abd pain GU- denies dysuria, hematuria, dribbling, incontinence MSK- denies joint pain, muscle aches, injury Neuro- denies headache, dizziness, syncope, seizure activity      Objective:   Physical Exam GEN- NAD, alert and oriented x3 HEENT- PERRL, EOMI, non injected sclera, pink conjunctiva, MMM, oropharynx clear Neck- Supple, No LAD CVS- RRR, no murmur RESP-CTAB ABD-NABS,soft, mild TTP epigastric region, no rebound, no guarding EXT- No edema, feet no open lesions Pulses- Radial, DP- 2+ Psych- normal affect and mood        Assessment & Plan:

## 2013-07-21 LAB — LIPASE: Lipase: 24 U/L (ref 0–75)

## 2013-07-21 LAB — COMPREHENSIVE METABOLIC PANEL
Alkaline Phosphatase: 38 U/L — ABNORMAL LOW (ref 39–117)
BUN: 9 mg/dL (ref 6–23)
CO2: 27 mEq/L (ref 19–32)
Creat: 0.74 mg/dL (ref 0.50–1.10)
Glucose, Bld: 129 mg/dL — ABNORMAL HIGH (ref 70–99)
Sodium: 136 mEq/L (ref 135–145)
Total Bilirubin: 1.1 mg/dL (ref 0.3–1.2)
Total Protein: 7.2 g/dL (ref 6.0–8.3)

## 2013-09-30 ENCOUNTER — Telehealth: Payer: Self-pay | Admitting: Family Medicine

## 2013-09-30 NOTE — Telephone Encounter (Signed)
Freada Bergeron at [facility] for community care . Would like to discuss this patient with about her daily needs .   [phone]

## 2013-10-03 NOTE — Telephone Encounter (Signed)
Please call Kayla Choi and see what orders she needs

## 2013-10-04 NOTE — Telephone Encounter (Signed)
They can give her diabetes education through there program

## 2013-10-04 NOTE — Telephone Encounter (Signed)
[facility] stated that pt is calling saying she needed Diabetes Education, was DX with Diabetes but was never provided assistance to her, Roanna Raider wanted to know what you thought about this.Marland Kitchenand if you wanted them to assist her.

## 2013-10-21 ENCOUNTER — Ambulatory Visit (INDEPENDENT_AMBULATORY_CARE_PROVIDER_SITE_OTHER): Payer: Medicaid Other | Admitting: Family Medicine

## 2013-10-21 VITALS — BP 130/80 | HR 88 | Temp 98.1°F | Resp 18 | Ht 62.0 in | Wt 193.0 lb

## 2013-10-21 DIAGNOSIS — E785 Hyperlipidemia, unspecified: Secondary | ICD-10-CM

## 2013-10-21 DIAGNOSIS — J449 Chronic obstructive pulmonary disease, unspecified: Secondary | ICD-10-CM

## 2013-10-21 DIAGNOSIS — F411 Generalized anxiety disorder: Secondary | ICD-10-CM

## 2013-10-21 DIAGNOSIS — I10 Essential (primary) hypertension: Secondary | ICD-10-CM

## 2013-10-21 DIAGNOSIS — M549 Dorsalgia, unspecified: Secondary | ICD-10-CM

## 2013-10-21 MED ORDER — LORAZEPAM 0.5 MG PO TABS: 0.5000 mg | ORAL_TABLET | Freq: Four times a day (QID) | ORAL | Status: DC | PRN

## 2013-10-21 MED ORDER — TRAMADOL HCL 50 MG PO TABS: 50.0000 mg | ORAL_TABLET | Freq: Three times a day (TID) | ORAL | Status: DC | PRN

## 2013-10-21 NOTE — Patient Instructions (Addendum)
For your blood pressure take the Benicar HCTZ once a day For cholesteorl take Crestor at bedtime Use Advair for your breathing Try to get your medication from pharmacy Use the ultram for your lower back Try to get your wellbutrin/zoloft and your ativan F/u 3 mont

## 2013-10-23 ENCOUNTER — Encounter: Payer: Self-pay | Admitting: Family Medicine

## 2013-10-23 DIAGNOSIS — M549 Dorsalgia, unspecified: Secondary | ICD-10-CM | POA: Insufficient documentation

## 2013-10-23 NOTE — Progress Notes (Signed)
  Subjective:    Patient ID: Kayla Choi, female    DOB: [birth year], 47 y.o.   MRN: [id number]  HPI  Pt here to f/u chronic medical problems, unfortunately she moved recently and has lost all of medication and does not have the finances to get them until the 1st week of December. She has not had any inhalers , though breathing has been stable past few weeks. She also does not have any of her anxiety/depression medications.  Back pain for the past couple of week, denies any heavy lifting during the move, no radiating symptoms, no change in bowel or bladder, tried ibuprofen with little relief.    Review of Systems   GEN- denies fatigue, fever, weight loss,weakness, recent illness HEENT- denies eye drainage, change in vision, nasal discharge, CVS- denies chest pain, palpitations RESP- denies SOB, cough, wheeze ABD- denies N/V, change in stools, abd pain GU- denies dysuria, hematuria, dribbling, incontinence MSK- + joint pain, muscle aches, injury Neuro- denies headache, dizziness, syncope, seizure activity      Objective:   Physical Exam GEN- NAD, alert and oriented x3 HEENT- PERRL, EOMI, non injected sclera, pink conjunctiva, MMM, oropharynx clear CVS- RRR, no murmur RESP-CTAB MSK- Spine TTP lower lumbar spine, mild bilateral paraspinal tenderness, neg SLR, normal flexion/extension, Good ROM HIPs EXT- No edema Pulses- Radial 2+ Psych- normal affect and mood        Assessment & Plan:

## 2013-10-23 NOTE — Assessment & Plan Note (Signed)
BP looks okay without meds today, given Benicar 20-12.5mg  samples to take for the next 2 weeks

## 2013-10-23 NOTE — Assessment & Plan Note (Signed)
Given crestor 10mg  samples to take for next few weeks

## 2013-10-23 NOTE — Assessment & Plan Note (Signed)
She will see if family members can get her money to get her mental health medications

## 2013-10-23 NOTE — Assessment & Plan Note (Signed)
Acute MSK pain, no red flags, given short course of ULtram, likley due to move

## 2013-10-23 NOTE — Assessment & Plan Note (Signed)
Given Advair sample from office 500-50, this will last 2 weeks

## 2014-01-24 ENCOUNTER — Emergency Department (HOSPITAL_COMMUNITY)
Admission: EM | Admit: 2014-01-24 | Discharge: 2014-01-24 | Disposition: A | Discharge status: Home / Self Care | Payer: Medicaid Other | Attending: Emergency Medicine | Admitting: Emergency Medicine

## 2014-01-24 ENCOUNTER — Encounter (HOSPITAL_COMMUNITY): Payer: Self-pay | Admitting: Emergency Medicine

## 2014-01-24 DIAGNOSIS — F411 Generalized anxiety disorder: Secondary | ICD-10-CM | POA: Insufficient documentation

## 2014-01-24 DIAGNOSIS — J449 Chronic obstructive pulmonary disease, unspecified: Secondary | ICD-10-CM | POA: Insufficient documentation

## 2014-01-24 DIAGNOSIS — E785 Hyperlipidemia, unspecified: Secondary | ICD-10-CM | POA: Insufficient documentation

## 2014-01-24 DIAGNOSIS — I10 Essential (primary) hypertension: Secondary | ICD-10-CM | POA: Insufficient documentation

## 2014-01-24 DIAGNOSIS — F3289 Other specified depressive episodes: Secondary | ICD-10-CM | POA: Insufficient documentation

## 2014-01-24 DIAGNOSIS — L02219 Cutaneous abscess of trunk, unspecified: Secondary | ICD-10-CM | POA: Insufficient documentation

## 2014-01-24 DIAGNOSIS — Z90710 Acquired absence of both cervix and uterus: Secondary | ICD-10-CM | POA: Insufficient documentation

## 2014-01-24 DIAGNOSIS — Y838 Other surgical procedures as the cause of abnormal reaction of the patient, or of later complication, without mention of misadventure at the time of the procedure: Secondary | ICD-10-CM | POA: Insufficient documentation

## 2014-01-24 DIAGNOSIS — F329 Major depressive disorder, single episode, unspecified: Secondary | ICD-10-CM | POA: Insufficient documentation

## 2014-01-24 DIAGNOSIS — E119 Type 2 diabetes mellitus without complications: Secondary | ICD-10-CM | POA: Insufficient documentation

## 2014-01-24 DIAGNOSIS — L910 Hypertrophic scar: Secondary | ICD-10-CM | POA: Insufficient documentation

## 2014-01-24 DIAGNOSIS — J4489 Other specified chronic obstructive pulmonary disease: Secondary | ICD-10-CM | POA: Insufficient documentation

## 2014-01-24 DIAGNOSIS — Z8669 Personal history of other diseases of the nervous system and sense organs: Secondary | ICD-10-CM | POA: Insufficient documentation

## 2014-01-24 DIAGNOSIS — Z91040 Latex allergy status: Secondary | ICD-10-CM | POA: Insufficient documentation

## 2014-01-24 DIAGNOSIS — T8140XA Infection following a procedure, unspecified, initial encounter: Secondary | ICD-10-CM | POA: Insufficient documentation

## 2014-01-24 DIAGNOSIS — L03319 Cellulitis of trunk, unspecified: Secondary | ICD-10-CM

## 2014-01-24 DIAGNOSIS — Z862 Personal history of diseases of the blood and blood-forming organs and certain disorders involving the immune mechanism: Secondary | ICD-10-CM | POA: Insufficient documentation

## 2014-01-24 DIAGNOSIS — F988 Other specified behavioral and emotional disorders with onset usually occurring in childhood and adolescence: Secondary | ICD-10-CM | POA: Insufficient documentation

## 2014-01-24 DIAGNOSIS — T8149XA Infection following a procedure, other surgical site, initial encounter: Secondary | ICD-10-CM

## 2014-01-24 DIAGNOSIS — Z79899 Other long term (current) drug therapy: Secondary | ICD-10-CM | POA: Insufficient documentation

## 2014-01-24 DIAGNOSIS — IMO0002 Reserved for concepts with insufficient information to code with codable children: Secondary | ICD-10-CM | POA: Insufficient documentation

## 2014-01-24 MED ORDER — LIDOCAINE HCL (PF) 1 % IJ SOLN
INTRAMUSCULAR | Status: AC
  Filled 2014-01-24: qty 5

## 2014-01-24 MED ORDER — CEPHALEXIN 500 MG PO CAPS: 500.0000 mg | ORAL_CAPSULE | Freq: Four times a day (QID) | ORAL | Status: DC

## 2014-01-24 MED ORDER — SULFAMETHOXAZOLE-TRIMETHOPRIM 800-160 MG PO TABS: 1.0000 | ORAL_TABLET | Freq: Two times a day (BID) | ORAL | Status: DC

## 2014-01-24 MED ORDER — HYDROCODONE-ACETAMINOPHEN 5-325 MG PO TABS: 2.0000 | ORAL_TABLET | ORAL | Status: DC | PRN

## 2014-01-24 NOTE — Discharge Instructions (Signed)
Keflex and Bactrim as prescribed.  Hydrocodone as prescribed as needed for pain.  Perform warm soaks to the area several times daily for the next several days.  Followup with your primary Dr. for a recheck in the next 3-5 days.   Abscess An abscess is an infected area that contains a collection of pus and debris.It can occur in almost any part of the body. An abscess is also known as a furuncle or boil. CAUSES  An abscess occurs when tissue gets infected. This can occur from blockage of oil or sweat glands, infection of hair follicles, or a minor injury to the skin. As the body tries to fight the infection, pus collects in the area and creates pressure under the skin. This pressure causes pain. People with weakened immune systems have difficulty fighting infections and get certain abscesses more often.  SYMPTOMS Usually an abscess develops on the skin and becomes a painful mass that is red, warm, and tender. If the abscess forms under the skin, you may feel a moveable soft area under the skin. Some abscesses break open (rupture) on their own, but most will continue to get worse without care. The infection can spread deeper into the body and eventually into the bloodstream, causing you to feel ill.  DIAGNOSIS  Your caregiver will take your medical history and perform a physical exam. A sample of fluid may also be taken from the abscess to determine what is causing your infection. TREATMENT  Your caregiver may prescribe antibiotic medicines to fight the infection. However, taking antibiotics alone usually does not cure an abscess. Your caregiver may need to make a small cut (incision) in the abscess to drain the pus. In some cases, gauze is packed into the abscess to reduce pain and to continue draining the area. HOME CARE INSTRUCTIONS   Only take over-the-counter or prescription medicines for pain, discomfort, or fever as directed by your caregiver.  If you were prescribed antibiotics, take  them as directed. Finish them even if you start to feel better.  If gauze is used, follow your caregiver's directions for changing the gauze.  To avoid spreading the infection:  Keep your draining abscess covered with a bandage.  Wash your hands well.  Do not share personal care items, towels, or whirlpools with others.  Avoid skin contact with others.  Keep your skin and clothes clean around the abscess.  Keep all follow-up appointments as directed by your caregiver. SEEK MEDICAL CARE IF:   You have increased pain, swelling, redness, fluid drainage, or bleeding.  You have muscle aches, chills, or a general ill feeling.  You have a fever. MAKE SURE YOU:   Understand these instructions.  Will watch your condition.  Will get help right away if you are not doing well or get worse. Document Released: 09/03/2005 Document Revised: 05/25/2012 Document Reviewed: 02/06/2012 [facility] Patient Information 2014 [place].

## 2014-01-24 NOTE — ED Notes (Signed)
Per patient has had bleeding from "old incision" site on abd. Per patient abd hysterectomy 2 years ago. Per patient has intermittent bleeding "sometimes." Per patient "bleed x2 days and now has stopped, now there is pain." Patient c/o pain at incision site with some dizziness. Patient reports using tylenol, ibuprofen, and cold compresses with no relief.

## 2014-01-24 NOTE — ED Provider Notes (Signed)
CSN: [id number]     Arrival date & time 01/24/14  1329 History   First MD Initiated Contact with Patient 01/24/14 1344     Chief Complaint  Patient presents with  . Drainage from Incision     (Consider location/radiation/quality/duration/timing/severity/associated sxs/prior Treatment) HPI Comments: Patient is a 48 year old female with history of keloid scar formation following a hysterectomy. She has had intermittent bleeding from this for many years. It is now become more swollen and painful over the past several days. She denies any fevers or chills. She denies any significant bleeding. There are no aggravating or alleviating factors.  The history is provided by the patient.    Past Medical History  Diagnosis Date  . Hypertension   . COPD (chronic obstructive pulmonary disease)   . Hyperlipidemia   . Prediabetes   . Anxiety     with social phobia  . Anemia     iron deficinecy  . Depression   . ADD (attention deficit disorder)   . Glaucoma   . Diabetes mellitus   . Social phobia    Past Surgical History  Procedure Laterality Date  . Cholecystectomy    . Right elbow      reconstruction  . Abdominal hysterectomy      fibroids, both ovaries left intact  . Cosmetic surgery      elbow  . Fracture surgery      Recurrent elbow surgery  . Lipoma excision  04/30/2012    Procedure: EXCISION LIPOMA;  Surgeon: Donato Heinz, MD;  Location: [facility];  Service: General;  Laterality: Right;  Excision soft tissue mass right thigh   Family History  Problem Relation Age of Onset  . Hypertension Mother   . Diabetes Father   . Heart disease Father   . Anesthesia problems Neg Hx   . Malignant hyperthermia Neg Hx   . Hypotension Neg Hx   . Pseudochol deficiency Neg Hx    History  Substance Use Topics  . Smoking status: Never Smoker   . Smokeless tobacco: Never Used  . Alcohol Use: No   OB History   Grav Para Term Preterm Abortions TAB SAB Ect Mult Living   2 2 2       2       Review of Systems  All other systems reviewed and are negative.      Allergies  Aspirin; Metformin and related; Other; Latex; and Neosporin  Home Medications   Current Outpatient Rx  Name  Route  Sig  Dispense  Refill  . albuterol (PROVENTIL HFA;VENTOLIN HFA) 108 (90 BASE) MCG/ACT inhaler   Inhalation   Inhale 2 puffs into the lungs every 6 (six) hours as needed for wheezing or shortness of breath.   1 Inhaler   6   . atorvastatin (LIPITOR) 20 MG tablet   Oral   Take 20 mg by mouth every evening.         . budesonide-formoterol (SYMBICORT) 160-4.5 MCG/ACT inhaler   Inhalation   Inhale 2 puffs into the lungs 2 (two) times daily.   1 Inhaler   6   . buPROPion (WELLBUTRIN XL) 300 MG 24 hr tablet   Oral   Take 1 tablet (300 mg total) by mouth daily.   30 tablet   6   . fluticasone (FLONASE) 50 MCG/ACT nasal spray   Nasal   Place 2 sprays into the nose daily.           Marland Kitchen lisinopril-hydrochlorothiazide (ZESTORETIC) 20-12.5 MG  per tablet   Oral   Take 1 tablet by mouth daily.   30 tablet   6   . loratadine (CLARITIN) 10 MG tablet   Oral   Take 1 tablet (10 mg total) by mouth daily.   14 tablet   0   . LORazepam (ATIVAN) 0.5 MG tablet   Oral   Take 1 tablet (0.5 mg total) by mouth every 6 (six) hours as needed. Sleep/anxiety   30 tablet   1   . sertraline (ZOLOFT) 100 MG tablet   Oral   Take 1 tablet (100 mg total) by mouth daily.   30 tablet   6   . tiotropium (SPIRIVA) 18 MCG inhalation capsule   Inhalation   Place 1 capsule (18 mcg total) into inhaler and inhale daily.   30 capsule   6   . topiramate (TOPAMAX) 25 MG tablet   Oral   Take 25 mg by mouth at bedtime.         . traMADol (ULTRAM) 50 MG tablet   Oral   Take 1 tablet (50 mg total) by mouth every 8 (eight) hours as needed.   30 tablet   0    BP 157/93  Pulse 116  Temp(Src) 98.4 F (36.9 C) (Oral)  Resp 18  Ht 5\' 1"  (1.549 m)  Wt 184 lb (83.462 kg)  BMI 34.78 kg/m2   SpO2 98% Physical Exam  Nursing note and vitals reviewed. Constitutional: She is oriented to person, place, and time. She appears well-developed and well-nourished. No distress.  HENT:  Head: Normocephalic and atraumatic.  Neck: Normal range of motion. Neck supple.  Neurological: She is alert and oriented to person, place, and time.  Skin: Skin is warm and dry. She is not diaphoretic.  There is a large keloid scar present in the suprapubic region. The right side of the scar is noted to have significant swelling and tenderness to palpation.    ED Course  Procedures (including critical care time) Labs Review Labs Reviewed - No data to display Imaging Review No results found.  INCISION AND DRAINAGE Performed by: Veryl Speak Consent: Verbal consent obtained. Risks and benefits: risks, benefits and alternatives were discussed Type: abscess  Body area: Suprapubic region  Anesthesia: local infiltration  Incision was made with a scalpel.  Local anesthetic: lidocaine 1 % without epinephrine  Anesthetic total: 2 ml  Complexity: complex Blunt dissection to break up loculations  Drainage: purulent  Drainage amount: Moderate   Packing material: None   Patient tolerance: Patient tolerated the procedure well with no immediate complications.     MDM   Final diagnoses:  None    The area of swelling of the scar appears to be a small abscess. This area was incised and drained with a #11 blade and a moderate quantity of pus was expressed. She will be discharged to home with antibiotics, pain medication, frequent soaks, and wound check in the next week.    Veryl Speak, MD 01/24/14 [phone]

## 2014-01-24 NOTE — ED Notes (Signed)
MD at bedside. 

## 2014-02-14 ENCOUNTER — Encounter: Payer: Self-pay | Admitting: Family Medicine

## 2014-02-14 ENCOUNTER — Ambulatory Visit (INDEPENDENT_AMBULATORY_CARE_PROVIDER_SITE_OTHER): Payer: Medicaid Other | Admitting: Family Medicine

## 2014-02-14 VITALS — BP 132/88 | HR 84 | Temp 98.1°F | Resp 18 | Ht 61.0 in | Wt 188.0 lb

## 2014-02-14 DIAGNOSIS — E785 Hyperlipidemia, unspecified: Secondary | ICD-10-CM

## 2014-02-14 DIAGNOSIS — E119 Type 2 diabetes mellitus without complications: Secondary | ICD-10-CM

## 2014-02-14 DIAGNOSIS — L0390 Cellulitis, unspecified: Secondary | ICD-10-CM

## 2014-02-14 DIAGNOSIS — L910 Hypertrophic scar: Secondary | ICD-10-CM

## 2014-02-14 DIAGNOSIS — L0291 Cutaneous abscess, unspecified: Secondary | ICD-10-CM

## 2014-02-14 DIAGNOSIS — T148XXA Other injury of unspecified body region, initial encounter: Secondary | ICD-10-CM

## 2014-02-14 NOTE — Patient Instructions (Signed)
Referral to surgeon about the large keloids Take pain meds if needed We will call with lab results F/U 3 months

## 2014-02-15 DIAGNOSIS — L0291 Cutaneous abscess, unspecified: Secondary | ICD-10-CM | POA: Insufficient documentation

## 2014-02-15 LAB — CBC WITH DIFFERENTIAL/PLATELET
Basophils Absolute: 0.1 10*3/uL (ref 0.0–0.1)
Basophils Relative: 1 % (ref 0–1)
EOS ABS: 0.1 10*3/uL (ref 0.0–0.7)
EOS PCT: 2 % (ref 0–5)
HCT: 38.2 % (ref 36.0–46.0)
Hemoglobin: 13.1 g/dL (ref 12.0–15.0)
LYMPHS ABS: 2.4 10*3/uL (ref 0.7–4.0)
Lymphocytes Relative: 45 % (ref 12–46)
MCH: 31 pg (ref 26.0–34.0)
MCHC: 34.3 g/dL (ref 30.0–36.0)
MCV: 90.3 fL (ref 78.0–100.0)
Monocytes Absolute: 0.3 10*3/uL (ref 0.1–1.0)
Monocytes Relative: 5 % (ref 3–12)
Neutro Abs: 2.5 10*3/uL (ref 1.7–7.7)
Neutrophils Relative %: 47 % (ref 43–77)
Platelets: 415 10*3/uL — ABNORMAL HIGH (ref 150–400)
RBC: 4.23 MIL/uL (ref 3.87–5.11)
RDW: 13.4 % (ref 11.5–15.5)
WBC: 5.3 10*3/uL (ref 4.0–10.5)

## 2014-02-15 LAB — COMPREHENSIVE METABOLIC PANEL
ALT: 17 U/L (ref 0–35)
AST: 18 U/L (ref 0–37)
Albumin: 4.2 g/dL (ref 3.5–5.2)
Alkaline Phosphatase: 35 U/L — ABNORMAL LOW (ref 39–117)
BUN: 8 mg/dL (ref 6–23)
CALCIUM: 9.1 mg/dL (ref 8.4–10.5)
CHLORIDE: 105 meq/L (ref 96–112)
CO2: 22 mEq/L (ref 19–32)
CREATININE: 0.77 mg/dL (ref 0.50–1.10)
Glucose, Bld: 88 mg/dL (ref 70–99)
Potassium: 4.8 mEq/L (ref 3.5–5.3)
Sodium: 136 mEq/L (ref 135–145)
Total Bilirubin: 1.3 mg/dL — ABNORMAL HIGH (ref 0.2–1.2)
Total Protein: 7.1 g/dL (ref 6.0–8.3)

## 2014-02-15 LAB — PROTIME-INR
INR: 0.94 (ref <1.50)
Prothrombin Time: 12.5 seconds (ref 11.6–15.2)

## 2014-02-15 LAB — HEMOGLOBIN A1C
Hgb A1c MFr Bld: 6.5 % — ABNORMAL HIGH (ref <5.7)
MEAN PLASMA GLUCOSE: 140 mg/dL — AB (ref <117)

## 2014-02-15 LAB — LIPID PANEL
CHOL/HDL RATIO: 4.7 ratio
CHOLESTEROL: 201 mg/dL — AB (ref 0–200)
HDL: 43 mg/dL (ref >39)
LDL Cholesterol: 139 mg/dL — ABNORMAL HIGH (ref 0–99)
Triglycerides: 93 mg/dL (ref <150)
VLDL: 19 mg/dL (ref 0–40)

## 2014-02-15 NOTE — Progress Notes (Signed)
Patient ID: Kayla Choi, female   DOB: 09-05-1966, 48 y.o.   MRN: [id number]   Subjective:    Patient ID: Kayla Choi, female    DOB: 1966-02-22, 48 y.o.   MRN: [id number]  Patient presents for Hospital F/U and RLE brusing  Patient here to followup hospital visit for infection into her keloids. She was seen in the ER she had an abscess in her keloid scar which is near the bikini area. She was placed on 2 different antibiotics. She still has some tenderness and redness and is completing one of the antibiotics Keflex. She's not had any fever or abdominal pain.  Diabetes mellitus currently diet controlled she is due for an A1c as well as labs she has difficulty getting to the office therefore non-fasting labs or needed  COPD-her breathing has been stable he currently has all of her inhalers.  Bruise on Right leg came up spontaneously, she does remember hitting her leg a week ago, no bleeding  Review Of Systems:  GEN- denies fatigue, fever, weight loss,weakness, recent illness HEENT- denies eye drainage, change in vision, nasal discharge, CVS- denies chest pain, palpitations RESP- denies SOB, cough, wheeze ABD- denies N/V, change in stools, abd pain GU- denies dysuria, hematuria, dribbling, incontinence MSK- denies joint pain, muscle aches, injury Neuro- denies headache, dizziness, syncope, seizure activity       Objective:    BP 132/88  Pulse 84  Temp(Src) 98.1 F (36.7 C)  Resp 18  Ht 5\' 1"  (1.549 m)  Wt 188 lb (85.276 kg)  BMI 35.54 kg/m2 GEN- NAD, alert and oriented x3 HEENT- PERRL, EOMI, non injected sclera, pink conjunctiva, MMM, oropharynx clear CVS- RRR, no murmur RESP-CTAB ABD-NABS,soft,NT,ND Skin- Right lateral leg 2cm bruise, no bleeding Multiple keloids at previous port incisions on abdomen, large keloids on cesarean scar, some dangling off of the scar, TTP, mild erythema, no fluctuant areas EXT- No edema Pulses- Radial, DP- 2+        Assessment & Plan:       Problem List Items Addressed This Visit   Type II or unspecified type diabetes mellitus without mention of complication, not stated as uncontrolled - Primary   Relevant Orders      Comprehensive metabolic panel (Completed)      Lipid panel (Completed)      CBC with Differential (Completed)      Hemoglobin A1c (Completed)    Other Visit Diagnoses   Bruising        Relevant Orders       Protime-INR (Completed)       Note: This dictation was prepared with Dragon dictation along with smaller phrase technology. Any transcriptional errors that result from this process are unintentional.

## 2014-02-15 NOTE — Assessment & Plan Note (Signed)
She has multiple keloids but with recent infection into keloids, this warrents surgical evaluation for possible removal or injections Complete antibiotics

## 2014-02-15 NOTE — Assessment & Plan Note (Signed)
Unable to consistently keep her meds, including statin, given crestor 10mg  samples, she will restart lipitor at end of month

## 2014-02-15 NOTE — Assessment & Plan Note (Signed)
Complete antibiotics- has remaining keflex

## 2014-02-15 NOTE — Assessment & Plan Note (Signed)
Check Fasting labs today

## 2014-02-16 ENCOUNTER — Encounter: Payer: Self-pay | Admitting: *Deleted

## 2014-03-28 DIAGNOSIS — Z872 Personal history of diseases of the skin and subcutaneous tissue: Secondary | ICD-10-CM | POA: Insufficient documentation

## 2014-03-30 ENCOUNTER — Telehealth: Payer: Self-pay | Admitting: *Deleted

## 2014-03-30 NOTE — Telephone Encounter (Signed)
Okay to order?

## 2014-03-30 NOTE — Telephone Encounter (Signed)
Returned call to [employer].   Advised that patient is concerned about fluctuating BP. States that with order from MD, patient can obtain BP cuff at little to no cost.   Ok to order?

## 2014-03-30 NOTE — Telephone Encounter (Signed)
Order written and awaiting signature.

## 2014-03-30 NOTE — Telephone Encounter (Signed)
Message copied by Sheral Flow on Thu Mar 30, 2014 10:46 AM ------      Message from: Devoria Glassing      Created: Thu Mar 30, 2014 10:37 AM       Letta Median care manager from [facility] calling to request rx for blood pressure cuff for this patient. Please call with questions at [phone] and the fax for the rx is [phone] ------

## 2014-04-16 ENCOUNTER — Encounter (HOSPITAL_COMMUNITY): Payer: Self-pay | Admitting: Emergency Medicine

## 2014-04-16 ENCOUNTER — Emergency Department (HOSPITAL_COMMUNITY)
Admission: EM | Admit: 2014-04-16 | Discharge: 2014-04-17 | Disposition: A | Discharge status: Home / Self Care | Payer: Medicaid Other | Attending: Emergency Medicine | Admitting: Emergency Medicine

## 2014-04-16 DIAGNOSIS — I10 Essential (primary) hypertension: Secondary | ICD-10-CM | POA: Insufficient documentation

## 2014-04-16 DIAGNOSIS — F988 Other specified behavioral and emotional disorders with onset usually occurring in childhood and adolescence: Secondary | ICD-10-CM | POA: Insufficient documentation

## 2014-04-16 DIAGNOSIS — Y9389 Activity, other specified: Secondary | ICD-10-CM | POA: Insufficient documentation

## 2014-04-16 DIAGNOSIS — H40009 Preglaucoma, unspecified, unspecified eye: Secondary | ICD-10-CM | POA: Insufficient documentation

## 2014-04-16 DIAGNOSIS — E785 Hyperlipidemia, unspecified: Secondary | ICD-10-CM | POA: Insufficient documentation

## 2014-04-16 DIAGNOSIS — F411 Generalized anxiety disorder: Secondary | ICD-10-CM | POA: Insufficient documentation

## 2014-04-16 DIAGNOSIS — Z862 Personal history of diseases of the blood and blood-forming organs and certain disorders involving the immune mechanism: Secondary | ICD-10-CM | POA: Insufficient documentation

## 2014-04-16 DIAGNOSIS — R296 Repeated falls: Secondary | ICD-10-CM | POA: Insufficient documentation

## 2014-04-16 DIAGNOSIS — Z79899 Other long term (current) drug therapy: Secondary | ICD-10-CM | POA: Insufficient documentation

## 2014-04-16 DIAGNOSIS — Z792 Long term (current) use of antibiotics: Secondary | ICD-10-CM | POA: Insufficient documentation

## 2014-04-16 DIAGNOSIS — J449 Chronic obstructive pulmonary disease, unspecified: Secondary | ICD-10-CM | POA: Insufficient documentation

## 2014-04-16 DIAGNOSIS — F4010 Social phobia, unspecified: Secondary | ICD-10-CM | POA: Insufficient documentation

## 2014-04-16 DIAGNOSIS — E119 Type 2 diabetes mellitus without complications: Secondary | ICD-10-CM | POA: Insufficient documentation

## 2014-04-16 DIAGNOSIS — J4489 Other specified chronic obstructive pulmonary disease: Secondary | ICD-10-CM | POA: Insufficient documentation

## 2014-04-16 DIAGNOSIS — S5000XA Contusion of unspecified elbow, initial encounter: Secondary | ICD-10-CM | POA: Insufficient documentation

## 2014-04-16 DIAGNOSIS — Z91040 Latex allergy status: Secondary | ICD-10-CM | POA: Insufficient documentation

## 2014-04-16 DIAGNOSIS — S5001XA Contusion of right elbow, initial encounter: Secondary | ICD-10-CM

## 2014-04-16 DIAGNOSIS — Y929 Unspecified place or not applicable: Secondary | ICD-10-CM | POA: Insufficient documentation

## 2014-04-16 DIAGNOSIS — IMO0002 Reserved for concepts with insufficient information to code with codable children: Secondary | ICD-10-CM | POA: Insufficient documentation

## 2014-04-16 DIAGNOSIS — F329 Major depressive disorder, single episode, unspecified: Secondary | ICD-10-CM | POA: Insufficient documentation

## 2014-04-16 DIAGNOSIS — F3289 Other specified depressive episodes: Secondary | ICD-10-CM | POA: Insufficient documentation

## 2014-04-16 NOTE — ED Notes (Signed)
Patient states she fell today and tried to catch herself with her right arm.  Patient c/o right elbow pain.

## 2014-04-16 NOTE — ED Provider Notes (Signed)
CSN: [id number]     Arrival date & time 04/16/14  2310 History  This chart was scribed for Kayla Kung, MD by Rolanda Lundborg, ED Scribe. This patient was seen in room [facility]09[facility]09 and the patient's care was started at 11:11 PM.    Chief Complaint  Patient presents with  . Arm Injury   Patient is a 48 y.o. female presenting with arm injury. The history is provided by the patient. No language interpreter was used.  Arm Injury Location:  Elbow Time since incident:  13 hours Injury: yes   Mechanism of injury: fall   Associated symptoms: no back pain, no fever and no neck pain    HPI Comments: Kayla Choi is a 48 y.o. female who presents to the Emergency Department complaining of right elbow pain since falling and trying to catch herself with her right arm around 1030am this morning. She fell on wet stairs outside. She has a h/o right elbow reconstructive surgery after breaking the radial head in 2003. No metal. She denies hitting her head or LOC. She denies back pain, CP, SOB, abdominal pain. Legs are fine. Left arm is fine.   Past Medical History  Diagnosis Date  . Hypertension   . COPD (chronic obstructive pulmonary disease)   . Hyperlipidemia   . Prediabetes   . Anxiety     with social phobia  . Anemia     iron deficinecy  . Depression   . ADD (attention deficit disorder)   . Glaucoma   . Diabetes mellitus   . Social phobia    Past Surgical History  Procedure Laterality Date  . Cholecystectomy    . Right elbow      reconstruction  . Abdominal hysterectomy      fibroids, both ovaries left intact  . Cosmetic surgery      elbow  . Fracture surgery      Recurrent elbow surgery  . Lipoma excision  04/30/2012    Procedure: EXCISION LIPOMA;  Surgeon: Donato Heinz, MD;  Location: [facility];  Service: General;  Laterality: Right;  Excision soft tissue mass right thigh   Family History  Problem Relation Age of Onset  . Hypertension Mother   . Diabetes Father   .  Heart disease Father   . Anesthesia problems Neg Hx   . Malignant hyperthermia Neg Hx   . Hypotension Neg Hx   . Pseudochol deficiency Neg Hx    History  Substance Use Topics  . Smoking status: Never Smoker   . Smokeless tobacco: Never Used  . Alcohol Use: No   OB History   Grav Para Term Preterm Abortions TAB SAB Ect Mult Living   2 2 2       2      Review of Systems  Constitutional: Negative for fever and chills.  HENT: Negative for rhinorrhea and sore throat.   Eyes: Negative for visual disturbance.  Respiratory: Negative for cough and shortness of breath.   Cardiovascular: Positive for leg swelling (ankles). Negative for chest pain.  Gastrointestinal: Negative for nausea, vomiting, abdominal pain and diarrhea.  Genitourinary: Negative for dysuria and hematuria.  Musculoskeletal: Negative for back pain and neck pain.  Skin: Negative for rash.  Neurological: Negative for headaches.  Hematological: Does not bruise/bleed easily.  Psychiatric/Behavioral: Negative for confusion.      Allergies  Aspirin; Metformin and related; Other; Latex; and Neosporin  Home Medications   Prior to Admission medications   Medication Sig Start Date End  Date Taking? Authorizing Provider  albuterol (PROVENTIL HFA;VENTOLIN HFA) 108 (90 BASE) MCG/ACT inhaler Inhale 2 puffs into the lungs every 6 (six) hours as needed for wheezing or shortness of breath. 07/20/13  Yes Alycia Rossetti, MD  atorvastatin (LIPITOR) 20 MG tablet Take 20 mg by mouth every evening. 07/20/13 07/20/14 Yes Alycia Rossetti, MD  budesonide-formoterol [facility]) 160-4.5 MCG/ACT inhaler Inhale 2 puffs into the lungs 2 (two) times daily. 07/20/13  Yes Alycia Rossetti, MD  buPROPion (WELLBUTRIN XL) 300 MG 24 hr tablet Take 1 tablet (300 mg total) by mouth daily. 07/20/13  Yes Alycia Rossetti, MD  fluticasone [facility]) 50 MCG/ACT nasal spray Place 2 sprays into the nose daily.     Yes Historical Provider, MD   HYDROcodone-acetaminophen (NORCO) 5-325 MG per tablet Take 2 tablets by mouth every 4 (four) hours as needed. 01/24/14  Yes Veryl Speak, MD  lisinopril-hydrochlorothiazide (ZESTORETIC) 20-12.5 MG per tablet Take 1 tablet by mouth daily. 07/20/13 07/20/14 Yes Alycia Rossetti, MD  loratadine (CLARITIN) 10 MG tablet Take 1 tablet (10 mg total) by mouth daily. 05/06/13  Yes Hope Bunnie Pion, NP  sertraline (ZOLOFT) 100 MG tablet Take 1 tablet (100 mg total) by mouth daily. 07/20/13  Yes Alycia Rossetti, MD  tiotropium (SPIRIVA) 18 MCG inhalation capsule Place 1 capsule (18 mcg total) into inhaler and inhale daily. 07/20/13  Yes Alycia Rossetti, MD  topiramate (TOPAMAX) 25 MG tablet Take 25 mg by mouth at bedtime.   Yes Historical Provider, MD  traMADol (ULTRAM) 50 MG tablet Take 1 tablet (50 mg total) by mouth every 8 (eight) hours as needed. 10/21/13  Yes Alycia Rossetti, MD  cephALEXin (KEFLEX) 500 MG capsule Take 1 capsule (500 mg total) by mouth 4 (four) times daily. 01/24/14   Veryl Speak, MD  LORazepam (ATIVAN) 0.5 MG tablet Take 1 tablet (0.5 mg total) by mouth every 6 (six) hours as needed. Sleep/anxiety 10/21/13   Alycia Rossetti, MD   BP 184/78  Pulse 70  Temp(Src) 98 F (36.7 C) (Oral)  Resp 20  Ht 5\' 2"  (1.575 m)  Wt 197 lb (89.359 kg)  BMI 36.02 kg/m2  SpO2 100% Physical Exam  Nursing note and vitals reviewed. Constitutional: She is oriented to person, place, and time. She appears well-developed and well-nourished. No distress.  HENT:  Head: Normocephalic and atraumatic.  Eyes: EOM are normal.  Neck: Neck supple. No tracheal deviation present.  Cardiovascular: Normal rate and regular rhythm.   Pulmonary/Chest: Effort normal and breath sounds normal. No respiratory distress. She has no wheezes.  Abdominal: Bowel sounds are normal. There is no tenderness.  Musculoskeletal: Normal range of motion. She exhibits no edema.  No significant swelling in the ankles. No hip pain. Radial  pulse is 2+ in the right arm. Shoulder is non-tender. No snuff box tenderness. No swelling to the wrist. tenderness around the elbow. Well-healed lateral surgicla scar measuring 6cm  Neurological: She is alert and oriented to person, place, and time.  Skin: Skin is warm and dry.  Psychiatric: She has a normal mood and affect. Her behavior is normal.    ED Course  Procedures (including critical care time) Medications  HYDROcodone-acetaminophen (NORCO/VICODIN) 5-325 MG per tablet 1 tablet (not administered)    DIAGNOSTIC STUDIES: Oxygen Saturation is 100% on RA, normal by my interpretation.    COORDINATION OF CARE: 12:00 AM- Discussed treatment plan with pt. Pt agrees to plan.    Labs Review Labs Reviewed -  No data to display  Imaging Review Dg Elbow Complete Right  04/17/2014   CLINICAL DATA:  Traumatic injury and pain  EXAM: RIGHT ELBOW - COMPLETE 3+ VIEW  COMPARISON:  02/05/2009  FINDINGS: Postsurgical changes are again identified in the lateral aspect of the distal humerus. Some degenerative changes about the radial head are seen as well. No acute fracture or dislocation is noted. No joint effusion is noted.  IMPRESSION: Postsurgical change without acute abnormality.   Electronically Signed   By: Inez Catalina M.D.   On: 04/17/2014 00:55     EKG Interpretation None      MDM   Final diagnoses:  Contusion of elbow, right    X-rays of the elbow negative for any bony injury. Will treat as a contusion with a sling and pain medication followup with orthopedics if not improved. No other significant injuries from the fall.  I personally performed the services described in this documentation, which was scribed in my presence. The recorded information has been reviewed and is accurate.    Kayla Kung, MD 04/17/14 [id number]

## 2014-04-17 ENCOUNTER — Emergency Department (HOSPITAL_COMMUNITY): Payer: Medicaid Other

## 2014-04-17 MED ORDER — HYDROCODONE-ACETAMINOPHEN 5-325 MG PO TABS: 1.0000 | ORAL_TABLET | Freq: Four times a day (QID) | ORAL | Status: DC | PRN

## 2014-04-17 MED ORDER — HYDROCODONE-ACETAMINOPHEN 5-325 MG PO TABS
1.0000 | ORAL_TABLET | Freq: Once | ORAL | Status: DC
  Filled 2014-04-17: qty 1

## 2014-04-17 NOTE — Discharge Instructions (Signed)
Elbow Contusion  An elbow contusion is a deep bruise of the elbow. Contusions happen when an injury causes bleeding under the skin. Signs of bruising include pain, puffiness (swelling), and discolored skin. The contusion may turn blue, purple, or yellow. HOME CARE  Put ice on the injured area.  Put ice in a plastic bag.  Place a towel between your skin and the bag.  Leave the ice on for 15-20 minutes, 03-04 times a day.  Only take medicines as told by your doctor.  Rest your elbow until the pain and puffiness are better.  Raise (elevate) your elbow to lessen puffiness.  Put on an elastic wrap as told by your doctor. You can take it off for sleeping, showers, and baths. If your fingers get cold, blue, or lose feeling (numb), take the wrap off. Put the wrap back on more loosely.  Use your elbow only as told by your doctor. If you are asked to do elbow exercises, do them as told.  Keep all doctor visits as told. GET HELP RIGHT AWAY IF:  You have more redness, puffiness, or pain in your elbow.  Your puffiness or pain is not helped by medicines.  You have puffiness of the hand and fingers.  You are not able to move your fingers or wrist.  You start to lose feeling in your hand or fingers.  Your fingers or hand become cold or blue. MAKE SURE YOU:   Understand these instructions.  Will watch your condition.  Will get help right away if you are not doing well or get worse. Document Released: 11/13/2011 Document Revised: 05/25/2012 Document Reviewed: 11/13/2011 [facility] Patient Information 2014 [place], [place].  X-ray of the right elbow negative for any bony fractures. Use a sling as needed for comfort. Followup with orthopedics if not improved in the 4-5 days. Return for any new or worse symptoms. Take pain medicine as directed.

## 2014-05-12 DIAGNOSIS — G43909 Migraine, unspecified, not intractable, without status migrainosus: Secondary | ICD-10-CM | POA: Insufficient documentation

## 2014-05-19 ENCOUNTER — Ambulatory Visit (INDEPENDENT_AMBULATORY_CARE_PROVIDER_SITE_OTHER): Payer: Medicaid Other | Admitting: Family Medicine

## 2014-05-19 ENCOUNTER — Encounter: Payer: Self-pay | Admitting: Family Medicine

## 2014-05-19 VITALS — BP 142/88 | HR 78 | Temp 98.1°F | Resp 14 | Ht 62.0 in | Wt 193.0 lb

## 2014-05-19 DIAGNOSIS — E119 Type 2 diabetes mellitus without complications: Secondary | ICD-10-CM

## 2014-05-19 DIAGNOSIS — I10 Essential (primary) hypertension: Secondary | ICD-10-CM

## 2014-05-19 DIAGNOSIS — E785 Hyperlipidemia, unspecified: Secondary | ICD-10-CM

## 2014-05-19 DIAGNOSIS — L910 Hypertrophic scar: Secondary | ICD-10-CM

## 2014-05-19 LAB — CBC WITH DIFFERENTIAL/PLATELET
Basophils Absolute: 0 10*3/uL (ref 0.0–0.1)
Basophils Relative: 0 % (ref 0–1)
EOS ABS: 0 10*3/uL (ref 0.0–0.7)
EOS PCT: 0 % (ref 0–5)
HCT: 37.2 % (ref 36.0–46.0)
Hemoglobin: 12.6 g/dL (ref 12.0–15.0)
LYMPHS ABS: 1.4 10*3/uL (ref 0.7–4.0)
Lymphocytes Relative: 9 % — ABNORMAL LOW (ref 12–46)
MCH: 31 pg (ref 26.0–34.0)
MCHC: 33.9 g/dL (ref 30.0–36.0)
MCV: 91.4 fL (ref 78.0–100.0)
Monocytes Absolute: 0.8 10*3/uL (ref 0.1–1.0)
Monocytes Relative: 5 % (ref 3–12)
NEUTROS PCT: 86 % — AB (ref 43–77)
Neutro Abs: 13.2 10*3/uL — ABNORMAL HIGH (ref 1.7–7.7)
Platelets: 410 10*3/uL — ABNORMAL HIGH (ref 150–400)
RBC: 4.07 MIL/uL (ref 3.87–5.11)
RDW: 13.2 % (ref 11.5–15.5)
WBC: 15.4 10*3/uL — ABNORMAL HIGH (ref 4.0–10.5)

## 2014-05-19 LAB — HEMOGLOBIN A1C
HEMOGLOBIN A1C: 6.6 % — AB (ref <5.7)
Mean Plasma Glucose: 143 mg/dL — ABNORMAL HIGH (ref <117)

## 2014-05-19 LAB — LIPID PANEL
CHOL/HDL RATIO: 3.8 ratio
CHOLESTEROL: 193 mg/dL (ref 0–200)
HDL: 51 mg/dL (ref >39)
LDL Cholesterol: 127 mg/dL — ABNORMAL HIGH (ref 0–99)
Triglycerides: 73 mg/dL (ref <150)
VLDL: 15 mg/dL (ref 0–40)

## 2014-05-19 LAB — BASIC METABOLIC PANEL
BUN: 13 mg/dL (ref 6–23)
CHLORIDE: 104 meq/L (ref 96–112)
CO2: 24 meq/L (ref 19–32)
CREATININE: 0.77 mg/dL (ref 0.50–1.10)
Calcium: 9 mg/dL (ref 8.4–10.5)
GLUCOSE: 161 mg/dL — AB (ref 70–99)
POTASSIUM: 4.3 meq/L (ref 3.5–5.3)
Sodium: 139 mEq/L (ref 135–145)

## 2014-05-19 NOTE — Assessment & Plan Note (Addendum)
I will recheck her A1c today Diet control

## 2014-05-19 NOTE — Progress Notes (Signed)
Patient ID: Kayla Choi, female   DOB: [birth year], 48 y.o.   MRN: [id number]   Subjective:    Patient ID: Kayla Choi, female    DOB: 09/12/1966, 48 y.o.   MRN: [id number]  Patient presents for 3 month F/U and F/U  patient here to followup chronic medical problems. She status post keloid removal surgery done at [facility] on yesterday. She's had a lot of bleeding for her bandages and will like me to evaluate this. She does not have her blood glucose meter with her today but states she is taking her medications as prescribed. She's no new concerns    Review Of Systems:  GEN- denies fatigue, fever, weight loss,weakness, recent illness HEENT- denies eye drainage, change in vision, nasal discharge, CVS- denies chest pain, palpitations RESP- denies SOB, cough, wheeze ABD- denies N/V, change in stools, abd pain MSK- denies joint pain, muscle aches, injury Neuro- denies headache, dizziness, syncope, seizure activity       Objective:    BP 142/88  Pulse 78  Temp(Src) 98.1 F (36.7 C) (Oral)  Resp 14  Ht 5\' 2"  (1.575 m)  Wt 193 lb (87.544 kg)  BMI 35.29 kg/m2 GEN- NAD, alert and oriented x3 HEENT- PERRL, EOMI, non injected sclera, pink conjunctiva, MMM, oropharynx clear CVS- RRR, no murmur RESP-few mild basilar crackles, otherwise CTAB, no wheeze, normal WOB ABD-NABS,soft,NT,ND Skin- multiple incision- 1 below bra line, umilical incison and bikini line incision with dry blood no active bleeding, few steri strips replaced,  EXT- No edema Pulses- Radial, DP- 2+        Assessment & Plan:      Problem List Items Addressed This Visit   Type II or unspecified type diabetes mellitus without mention of complication, not stated as uncontrolled - Primary   Relevant Orders      Hemoglobin A1c      Basic metabolic panel      CBC with Differential   Keloid scar of skin   HYPERTENSION   HYPERLIPIDEMIA   Relevant Orders      Lipid panel      Note: This dictation was  prepared with Dragon dictation along with smaller phrase technology. Any transcriptional errors that result from this process are unintentional.

## 2014-05-19 NOTE — Assessment & Plan Note (Signed)
Status post surgical removal of a keloid her dressings were changed at the bedside she had antibiotics and pain medications prescribed by her surgeon at [facility]

## 2014-05-19 NOTE — Assessment & Plan Note (Signed)
Blood pressure elevated today however no medications taken this morning advise her to continue her current medications

## 2014-05-19 NOTE — Patient Instructions (Signed)
We will call with lab results Continue antibiotics and pain medication Remove the bandage on Sunday  F/U 3 months or as needed before then

## 2014-05-23 ENCOUNTER — Encounter (HOSPITAL_COMMUNITY): Payer: Self-pay | Admitting: Emergency Medicine

## 2014-05-23 ENCOUNTER — Telehealth: Payer: Self-pay | Admitting: Family Medicine

## 2014-05-23 ENCOUNTER — Emergency Department (HOSPITAL_COMMUNITY)
Admission: EM | Admit: 2014-05-23 | Discharge: 2014-05-23 | Disposition: A | Discharge status: Home / Self Care | Payer: Medicaid Other | Attending: Emergency Medicine | Admitting: Emergency Medicine

## 2014-05-23 DIAGNOSIS — F3289 Other specified depressive episodes: Secondary | ICD-10-CM | POA: Insufficient documentation

## 2014-05-23 DIAGNOSIS — R739 Hyperglycemia, unspecified: Secondary | ICD-10-CM

## 2014-05-23 DIAGNOSIS — J4489 Other specified chronic obstructive pulmonary disease: Secondary | ICD-10-CM | POA: Insufficient documentation

## 2014-05-23 DIAGNOSIS — I10 Essential (primary) hypertension: Secondary | ICD-10-CM | POA: Insufficient documentation

## 2014-05-23 DIAGNOSIS — IMO0002 Reserved for concepts with insufficient information to code with codable children: Secondary | ICD-10-CM | POA: Insufficient documentation

## 2014-05-23 DIAGNOSIS — R0789 Other chest pain: Secondary | ICD-10-CM

## 2014-05-23 DIAGNOSIS — F988 Other specified behavioral and emotional disorders with onset usually occurring in childhood and adolescence: Secondary | ICD-10-CM | POA: Insufficient documentation

## 2014-05-23 DIAGNOSIS — F411 Generalized anxiety disorder: Secondary | ICD-10-CM | POA: Insufficient documentation

## 2014-05-23 DIAGNOSIS — Z79899 Other long term (current) drug therapy: Secondary | ICD-10-CM | POA: Insufficient documentation

## 2014-05-23 DIAGNOSIS — H409 Unspecified glaucoma: Secondary | ICD-10-CM | POA: Insufficient documentation

## 2014-05-23 DIAGNOSIS — F329 Major depressive disorder, single episode, unspecified: Secondary | ICD-10-CM | POA: Insufficient documentation

## 2014-05-23 DIAGNOSIS — E119 Type 2 diabetes mellitus without complications: Secondary | ICD-10-CM | POA: Insufficient documentation

## 2014-05-23 DIAGNOSIS — J449 Chronic obstructive pulmonary disease, unspecified: Secondary | ICD-10-CM | POA: Insufficient documentation

## 2014-05-23 DIAGNOSIS — E785 Hyperlipidemia, unspecified: Secondary | ICD-10-CM | POA: Insufficient documentation

## 2014-05-23 DIAGNOSIS — Z91040 Latex allergy status: Secondary | ICD-10-CM | POA: Insufficient documentation

## 2014-05-23 DIAGNOSIS — Z862 Personal history of diseases of the blood and blood-forming organs and certain disorders involving the immune mechanism: Secondary | ICD-10-CM | POA: Insufficient documentation

## 2014-05-23 LAB — CBC WITH DIFFERENTIAL/PLATELET
BASOS ABS: 0 10*3/uL (ref 0.0–0.1)
BASOS PCT: 0 % (ref 0–1)
EOS PCT: 0 % (ref 0–5)
Eosinophils Absolute: 0.1 10*3/uL (ref 0.0–0.7)
HEMATOCRIT: 38.3 % (ref 36.0–46.0)
Hemoglobin: 13 g/dL (ref 12.0–15.0)
LYMPHS PCT: 22 % (ref 12–46)
Lymphs Abs: 2.5 10*3/uL (ref 0.7–4.0)
MCH: 31.2 pg (ref 26.0–34.0)
MCHC: 33.9 g/dL (ref 30.0–36.0)
MCV: 91.8 fL (ref 78.0–100.0)
MONO ABS: 1 10*3/uL (ref 0.1–1.0)
Monocytes Relative: 8 % (ref 3–12)
Neutro Abs: 8.1 10*3/uL — ABNORMAL HIGH (ref 1.7–7.7)
Neutrophils Relative %: 70 % (ref 43–77)
PLATELETS: 397 10*3/uL (ref 150–400)
RBC: 4.17 MIL/uL (ref 3.87–5.11)
RDW: 12.8 % (ref 11.5–15.5)
WBC: 11.6 10*3/uL — ABNORMAL HIGH (ref 4.0–10.5)

## 2014-05-23 LAB — BASIC METABOLIC PANEL
BUN: 14 mg/dL (ref 6–23)
CALCIUM: 8.9 mg/dL (ref 8.4–10.5)
CO2: 28 mEq/L (ref 19–32)
Chloride: 99 mEq/L (ref 96–112)
Creatinine, Ser: 0.72 mg/dL (ref 0.50–1.10)
GFR calc Af Amer: >90 mL/min (ref >90)
GFR calc non Af Amer: >90 mL/min (ref >90)
Glucose, Bld: 252 mg/dL — ABNORMAL HIGH (ref 70–99)
Potassium: 3.8 mEq/L (ref 3.7–5.3)
SODIUM: 138 meq/L (ref 137–147)

## 2014-05-23 LAB — URINALYSIS, ROUTINE W REFLEX MICROSCOPIC
BILIRUBIN URINE: NEGATIVE
Glucose, UA: 100 mg/dL — AB
KETONES UR: NEGATIVE mg/dL
LEUKOCYTES UA: NEGATIVE
NITRITE: NEGATIVE
Protein, ur: 100 mg/dL — AB
Specific Gravity, Urine: >1.03 — ABNORMAL HIGH (ref 1.005–1.030)
UROBILINOGEN UA: 1 mg/dL (ref 0.0–1.0)
pH: 6 (ref 5.0–8.0)

## 2014-05-23 LAB — TROPONIN I

## 2014-05-23 LAB — URINE MICROSCOPIC-ADD ON

## 2014-05-23 MED ORDER — ATORVASTATIN CALCIUM 40 MG PO TABS
40.0000 mg | ORAL_TABLET | Freq: Every day | ORAL | Status: DC
Start: 2014-05-23 — End: 2014-10-09

## 2014-05-23 MED ORDER — METFORMIN HCL 500 MG PO TABS: 500.0000 mg | ORAL_TABLET | Freq: Two times a day (BID) | ORAL | Status: DC

## 2014-05-23 MED ORDER — SODIUM CHLORIDE 0.9 % IV BOLUS (SEPSIS)
1000.0000 mL | Freq: Once | INTRAVENOUS | Status: AC
  Administered 2014-05-23: 1000 mL via INTRAVENOUS

## 2014-05-23 NOTE — ED Notes (Addendum)
Pt c/o diarrhea and nausea since having surgery Thursday. When I asked pt a pain level she informed me her chest was hurting. Pt states her chest feels tight and she cannot get her breath.

## 2014-05-23 NOTE — Telephone Encounter (Signed)
Called pt about BW results and she stated that she was seen in ER last pm and the ER MD informed her that her BS was elevated and that she needed to contact her PCP to be put on medication to regulate her bs. *See ER note** I did tell pt that her A1C was good off medication but would ask about the elevated bs last pm.

## 2014-05-23 NOTE — Telephone Encounter (Signed)
Patient aware.

## 2014-05-23 NOTE — ED Provider Notes (Addendum)
CSN: [id number]     Arrival date & time 05/23/14  0153 History   First MD Initiated Contact with Patient 05/23/14 [phone]     Chief Complaint  Patient presents with  . Chest Pain     (Consider location/radiation/quality/duration/timing/severity/associated sxs/prior Treatment) HPI This is a 48 year old female who had keloids surgically removed from her upper and lower abdomen 5 days ago. She was discharged home with oxycodone and an unspecified antibiotic. She states the oxycodone had been making her but she was able to tolerate this. For the past 4 days she has had fairly constant chest pain which she describes as feeling like someone is standing on her chest. She cannot say if it is worse with exertion or improved with rest but does say that his is worse sitting upright and improved with reclining. She states it might also be due to anxiety and she has been very anxious about her recent surgery. There is associated chest tightness and shortness of breath; she has not been using her albuterol inhaler for this. She also complains of diarrhea that began yesterday after being outside in the hot sun. She also complains of nausea and lack of appetite. She contacted her surgeon yesterday who advised her to stop taking the oxycodone as it was likely upsetting her stomach. She states she is only here this morning because her mother made her calm otherwise she wanted to try to "pass out". She also complains of her jaw feeling large in the sense that all of her teeth are about to fall out, especially if she were to touch them. She is also having dizziness which she describes as the room spinning when she stands up.  Past Medical History  Diagnosis Date  . Hypertension   . COPD (chronic obstructive pulmonary disease)   . Hyperlipidemia   . Prediabetes   . Anxiety     with social phobia  . Anemia     iron deficinecy  . Depression   . ADD (attention deficit disorder)   . Glaucoma   . Diabetes mellitus   .  Social phobia    Past Surgical History  Procedure Laterality Date  . Cholecystectomy    . Right elbow      reconstruction  . Abdominal hysterectomy      fibroids, both ovaries left intact  . Cosmetic surgery      elbow  . Fracture surgery      Recurrent elbow surgery  . Lipoma excision  04/30/2012    Procedure: EXCISION LIPOMA;  Surgeon: Donato Heinz, MD;  Location: [facility];  Service: General;  Laterality: Right;  Excision soft tissue mass right thigh   Family History  Problem Relation Age of Onset  . Hypertension Mother   . Diabetes Father   . Heart disease Father   . Anesthesia problems Neg Hx   . Malignant hyperthermia Neg Hx   . Hypotension Neg Hx   . Pseudochol deficiency Neg Hx    History  Substance Use Topics  . Smoking status: Never Smoker   . Smokeless tobacco: Never Used  . Alcohol Use: No   OB History   Grav Para Term Preterm Abortions TAB SAB Ect Mult Living   2 2 2       2      Review of Systems  All other systems reviewed and are negative.   Allergies  Aspirin; Metformin and related; Other; Latex; and Neosporin  Home Medications   Prior to Admission medications  Medication Sig Start Date End Date Taking? Authorizing Provider  albuterol (PROVENTIL HFA;VENTOLIN HFA) 108 (90 BASE) MCG/ACT inhaler Inhale 2 puffs into the lungs every 6 (six) hours as needed for wheezing or shortness of breath. 07/20/13   Alycia Rossetti, MD  atorvastatin (LIPITOR) 20 MG tablet Take 20 mg by mouth every evening. 07/20/13 07/20/14  Alycia Rossetti, MD  budesonide-formoterol [facility]) 160-4.5 MCG/ACT inhaler Inhale 2 puffs into the lungs 2 (two) times daily. 07/20/13   Alycia Rossetti, MD  buPROPion (WELLBUTRIN XL) 300 MG 24 hr tablet Take 1 tablet (300 mg total) by mouth daily. 07/20/13   Alycia Rossetti, MD  fluticasone (FLONASE) 50 MCG/ACT nasal spray Place 2 sprays into the nose daily.      Historical Provider, MD  HYDROcodone-acetaminophen (NORCO) 5-325 MG per tablet  Take 2 tablets by mouth every 4 (four) hours as needed. 01/24/14   Veryl Speak, MD  lisinopril-hydrochlorothiazide (ZESTORETIC) 20-12.5 MG per tablet Take 1 tablet by mouth daily. 07/20/13 07/20/14  Alycia Rossetti, MD  loratadine (CLARITIN) 10 MG tablet Take 1 tablet (10 mg total) by mouth daily. 05/06/13   Hope Bunnie Pion, NP  LORazepam (ATIVAN) 0.5 MG tablet Take 1 tablet (0.5 mg total) by mouth every 6 (six) hours as needed. Sleep/anxiety 10/21/13   Alycia Rossetti, MD  oxyCODONE-acetaminophen (ROXICET) 5-325 MG per tablet Take 1 tablet by mouth. 05/18/14   Historical Provider, MD  sertraline (ZOLOFT) 100 MG tablet Take 1 tablet (100 mg total) by mouth daily. 07/20/13   Alycia Rossetti, MD  tiotropium (SPIRIVA) 18 MCG inhalation capsule Place 1 capsule (18 mcg total) into inhaler and inhale daily. 07/20/13   Alycia Rossetti, MD  topiramate (TOPAMAX) 25 MG tablet Take 25 mg by mouth at bedtime.    Historical Provider, MD  traMADol (ULTRAM) 50 MG tablet Take 1 tablet (50 mg total) by mouth every 8 (eight) hours as needed. 10/21/13   Alycia Rossetti, MD   BP 185/93  Pulse 72  Temp(Src) 98.6 F (37 C) (Oral)  Resp 10  Ht 5\' 2"  (1.575 m)  Wt 193 lb (87.544 kg)  BMI 35.29 kg/m2  SpO2 100%  Physical Exam General: Well-developed, well-nourished female in no acute distress; appearance consistent with age of record HENT: normocephalic; atraumatic; no swelling of jaw seen or palpated; no TMJ tenderness Eyes: pupils equal, round and reactive to light; extraocular muscles intact Neck: supple Heart: regular rate and rhythm Lungs: clear to auscultation bilaterally Abdomen: soft; nondistended; nontender; no masses or hepatosplenomegaly; bowel sounds present Extremities: No deformity; full range of motion; pulses normal Neurologic: Awake, alert and oriented; motor function intact in all extremities and symmetric; no facial droop Skin: Warm and dry; well healing surgical incisions of upper and lower  abdomen and umbilicus without signs of infection Psychiatric: Normal mood and affect   ED Course  Procedures (including critical care time)  MDM    EKG Interpretation  Date/Time:  Tuesday May 23 2014 02:19:38 EDT Ventricular Rate:  71 PR Interval:  102 QRS Duration: 72 QT Interval:  401 QTC Calculation: 436 R Axis:   52 Text Interpretation:  Sinus rhythm Short PR interval Rate is slower, PR interval is shorter Confirmed by Florina Ou  MD, Jenny Reichmann ([id number]) on 05/23/2014 2:36:29 AM      Nursing notes and vitals signs, including pulse oximetry, reviewed.  Summary of this visit's results, reviewed by myself:  Labs:  Results for orders placed during the hospital encounter of  05/23/14 (from the past 24 hour(s))  CBC WITH DIFFERENTIAL     Status: Abnormal   Collection Time    05/23/14  2:53 AM      Result Value Ref Range   WBC 11.6 (*) 4.0 - 10.5 K/uL   RBC 4.17  3.87 - 5.11 MIL/uL   Hemoglobin 13.0  12.0 - 15.0 g/dL   HCT 38.3  36.0 - 46.0 %   MCV 91.8  78.0 - 100.0 fL   MCH 31.2  26.0 - 34.0 pg   MCHC 33.9  30.0 - 36.0 g/dL   RDW 12.8  11.5 - 15.5 %   Platelets 397  150 - 400 K/uL   Neutrophils Relative % 70  43 - 77 %   Neutro Abs 8.1 (*) 1.7 - 7.7 K/uL   Lymphocytes Relative 22  12 - 46 %   Lymphs Abs 2.5  0.7 - 4.0 K/uL   Monocytes Relative 8  3 - 12 %   Monocytes Absolute 1.0  0.1 - 1.0 K/uL   Eosinophils Relative 0  0 - 5 %   Eosinophils Absolute 0.1  0.0 - 0.7 K/uL   Basophils Relative 0  0 - 1 %   Basophils Absolute 0.0  0.0 - 0.1 K/uL  BASIC METABOLIC PANEL     Status: Abnormal   Collection Time    05/23/14  2:53 AM      Result Value Ref Range   Sodium 138  137 - 147 mEq/L   Potassium 3.8  3.7 - 5.3 mEq/L   Chloride 99  96 - 112 mEq/L   CO2 28  19 - 32 mEq/L   Glucose, Bld 252 (*) 70 - 99 mg/dL   BUN 14  6 - 23 mg/dL   Creatinine, Ser 0.72  0.50 - 1.10 mg/dL   Calcium 8.9  8.4 - 10.5 mg/dL   GFR calc non Af Amer >90  >90 mL/min   GFR calc Af Amer >90  >90  mL/min  TROPONIN I     Status: None   Collection Time    05/23/14  2:53 AM      Result Value Ref Range   Troponin I <0.30  <0.30 ng/mL  URINALYSIS, ROUTINE W REFLEX MICROSCOPIC     Status: Abnormal   Collection Time    05/23/14  3:55 AM      Result Value Ref Range   Color, Urine YELLOW  YELLOW   APPearance CLEAR  CLEAR   Specific Gravity, Urine >1.030 (*) 1.005 - 1.030   pH 6.0  5.0 - 8.0   Glucose, UA 100 (*) NEGATIVE mg/dL   Hgb urine dipstick TRACE (*) NEGATIVE   Bilirubin Urine NEGATIVE  NEGATIVE   Ketones, ur NEGATIVE  NEGATIVE mg/dL   Protein, ur 100 (*) NEGATIVE mg/dL   Urobilinogen, UA 1.0  0.0 - 1.0 mg/dL   Nitrite NEGATIVE  NEGATIVE   Leukocytes, UA NEGATIVE  NEGATIVE  URINE MICROSCOPIC-ADD ON     Status: Abnormal   Collection Time    05/23/14  3:55 AM      Result Value Ref Range   Squamous Epithelial / LPF MANY (*) RARE   WBC, UA 0-2  <3 WBC/hpf   RBC / HPF 0-2  <3 RBC/hpf   Bacteria, UA MANY (*) RARE   Urine-Other MUCOUS PRESENT     4:16 AM Patient was advised of her elevated blood sugar. She states she was previously on metformin but was taken off of  it because it made her face swell up. She was advised to call her primary care physician today to set up followup for control of her blood sugar. We will give her IV fluid bolus in the ED. Her EKG and troponin are negative for acute ischemia.      Wynetta Fines, MD 05/23/14 Adrian, MD 05/23/14 0430

## 2014-05-23 NOTE — Telephone Encounter (Signed)
This was due to something she ate most likley and the stress after surgery. For now A1C looks good, I will not start any medication, she can keep checking her fasting BS and call if they stay above 180

## 2014-06-25 ENCOUNTER — Emergency Department (HOSPITAL_COMMUNITY): Payer: Medicaid Other

## 2014-06-25 ENCOUNTER — Emergency Department (HOSPITAL_COMMUNITY)
Admission: EM | Admit: 2014-06-25 | Discharge: 2014-06-25 | Disposition: A | Discharge status: Home / Self Care | Payer: Medicaid Other | Attending: Emergency Medicine | Admitting: Emergency Medicine

## 2014-06-25 ENCOUNTER — Encounter (HOSPITAL_COMMUNITY): Payer: Self-pay | Admitting: Emergency Medicine

## 2014-06-25 DIAGNOSIS — I10 Essential (primary) hypertension: Secondary | ICD-10-CM | POA: Insufficient documentation

## 2014-06-25 DIAGNOSIS — R079 Chest pain, unspecified: Secondary | ICD-10-CM | POA: Diagnosis not present

## 2014-06-25 DIAGNOSIS — R197 Diarrhea, unspecified: Secondary | ICD-10-CM | POA: Diagnosis not present

## 2014-06-25 DIAGNOSIS — R0600 Dyspnea, unspecified: Secondary | ICD-10-CM

## 2014-06-25 DIAGNOSIS — Z8669 Personal history of other diseases of the nervous system and sense organs: Secondary | ICD-10-CM | POA: Insufficient documentation

## 2014-06-25 DIAGNOSIS — IMO0002 Reserved for concepts with insufficient information to code with codable children: Secondary | ICD-10-CM | POA: Insufficient documentation

## 2014-06-25 DIAGNOSIS — R110 Nausea: Secondary | ICD-10-CM | POA: Diagnosis not present

## 2014-06-25 DIAGNOSIS — Z79899 Other long term (current) drug therapy: Secondary | ICD-10-CM | POA: Insufficient documentation

## 2014-06-25 DIAGNOSIS — F3289 Other specified depressive episodes: Secondary | ICD-10-CM | POA: Diagnosis not present

## 2014-06-25 DIAGNOSIS — Z91040 Latex allergy status: Secondary | ICD-10-CM | POA: Diagnosis not present

## 2014-06-25 DIAGNOSIS — E785 Hyperlipidemia, unspecified: Secondary | ICD-10-CM | POA: Diagnosis not present

## 2014-06-25 DIAGNOSIS — F411 Generalized anxiety disorder: Secondary | ICD-10-CM | POA: Insufficient documentation

## 2014-06-25 DIAGNOSIS — J441 Chronic obstructive pulmonary disease with (acute) exacerbation: Secondary | ICD-10-CM | POA: Diagnosis not present

## 2014-06-25 DIAGNOSIS — F329 Major depressive disorder, single episode, unspecified: Secondary | ICD-10-CM | POA: Diagnosis not present

## 2014-06-25 DIAGNOSIS — E119 Type 2 diabetes mellitus without complications: Secondary | ICD-10-CM | POA: Diagnosis not present

## 2014-06-25 LAB — BASIC METABOLIC PANEL
Anion gap: 12 (ref 5–15)
BUN: 10 mg/dL (ref 6–23)
CO2: 26 meq/L (ref 19–32)
CREATININE: 0.77 mg/dL (ref 0.50–1.10)
Calcium: 9.7 mg/dL (ref 8.4–10.5)
Chloride: 98 mEq/L (ref 96–112)
GFR calc Af Amer: >90 mL/min (ref >90)
GFR calc non Af Amer: >90 mL/min (ref >90)
GLUCOSE: 165 mg/dL — AB (ref 70–99)
Potassium: 3.8 mEq/L (ref 3.7–5.3)
Sodium: 136 mEq/L — ABNORMAL LOW (ref 137–147)

## 2014-06-25 LAB — CBC
HEMATOCRIT: 41.1 % (ref 36.0–46.0)
Hemoglobin: 13.8 g/dL (ref 12.0–15.0)
MCH: 31.3 pg (ref 26.0–34.0)
MCHC: 33.6 g/dL (ref 30.0–36.0)
MCV: 93.2 fL (ref 78.0–100.0)
PLATELETS: 415 10*3/uL — AB (ref 150–400)
RBC: 4.41 MIL/uL (ref 3.87–5.11)
RDW: 13.1 % (ref 11.5–15.5)
WBC: 7.6 10*3/uL (ref 4.0–10.5)

## 2014-06-25 LAB — TROPONIN I: Troponin I: <0.3 ng/mL (ref <0.30)

## 2014-06-25 LAB — PRO B NATRIURETIC PEPTIDE: Pro B Natriuretic peptide (BNP): 55.6 pg/mL (ref 0–125)

## 2014-06-25 LAB — D-DIMER, QUANTITATIVE: D-Dimer, Quant: 0.44 ug/mL-FEU (ref 0.00–0.48)

## 2014-06-25 MED ORDER — LABETALOL HCL 5 MG/ML IV SOLN
20.0000 mg | INTRAVENOUS | Status: DC | PRN
  Administered 2014-06-25: 20 mg via INTRAVENOUS
  Filled 2014-06-25: qty 4

## 2014-06-25 MED ORDER — ONDANSETRON HCL 4 MG/2ML IJ SOLN
4.0000 mg | Freq: Once | INTRAMUSCULAR | Status: AC
  Administered 2014-06-25: 4 mg via INTRAVENOUS
  Filled 2014-06-25: qty 2

## 2014-06-25 MED ORDER — MORPHINE SULFATE 4 MG/ML IJ SOLN
4.0000 mg | Freq: Once | INTRAMUSCULAR | Status: AC
  Administered 2014-06-25: 4 mg via INTRAVENOUS
  Filled 2014-06-25: qty 1

## 2014-06-25 NOTE — ED Notes (Signed)
EDP at bedside  

## 2014-06-25 NOTE — Discharge Instructions (Signed)
Chest Pain (Nonspecific) °It is often hard to give a specific diagnosis for the cause of chest pain. There is always a chance that your pain could be related to something serious, such as a heart attack or a blood clot in the lungs. You need to follow up with your health care provider for further evaluation. °CAUSES  °· Heartburn. °· Pneumonia or bronchitis. °· Anxiety or stress. °· Inflammation around your heart (pericarditis) or lung (pleuritis or pleurisy). °· A blood clot in the lung. °· A collapsed lung (pneumothorax). It can develop suddenly on its own (spontaneous pneumothorax) or from trauma to the chest. °· Shingles infection (herpes zoster virus). °The chest wall is composed of bones, muscles, and cartilage. Any of these can be the source of the pain. °· The bones can be bruised by injury. °· The muscles or cartilage can be strained by coughing or overwork. °· The cartilage can be affected by inflammation and become sore (costochondritis). °DIAGNOSIS  °Lab tests or other studies may be needed to find the cause of your pain. Your health care provider may have you take a test called an ambulatory electrocardiogram (ECG). An ECG records your heartbeat patterns over a 24-hour period. You may also have other tests, such as: °· Transthoracic echocardiogram (TTE). During echocardiography, sound waves are used to evaluate how blood flows through your heart. °· Transesophageal echocardiogram (TEE). °· Cardiac monitoring. This allows your health care provider to monitor your heart rate and rhythm in real time. °· Holter monitor. This is a portable device that records your heartbeat and can help diagnose heart arrhythmias. It allows your health care provider to track your heart activity for several days, if needed. °· Stress tests by exercise or by giving medicine that makes the heart beat faster. °TREATMENT  °· Treatment depends on what may be causing your chest pain. Treatment may include: °¨ Acid blockers for  heartburn. °¨ Anti-inflammatory medicine. °¨ Pain medicine for inflammatory conditions. °¨ Antibiotics if an infection is present. °· You may be advised to change lifestyle habits. This includes stopping smoking and avoiding alcohol, caffeine, and chocolate. °· You may be advised to keep your head raised (elevated) when sleeping. This reduces the chance of acid going backward from your stomach into your esophagus. °Most of the time, nonspecific chest pain will improve within 2-3 days with rest and mild pain medicine.  °HOME CARE INSTRUCTIONS  °· If antibiotics were prescribed, take them as directed. Finish them even if you start to feel better. °· For the next few days, avoid physical activities that bring on chest pain. Continue physical activities as directed. °· Do not use any tobacco products, including cigarettes, chewing tobacco, or electronic cigarettes. °· Avoid drinking alcohol. °· Only take medicine as directed by your health care provider. °· Follow your health care provider's suggestions for further testing if your chest pain does not go away. °· Keep any follow-up appointments you made. If you do not go to an appointment, you could develop lasting (chronic) problems with pain. If there is any problem keeping an appointment, call to reschedule. °SEEK MEDICAL CARE IF:  °· Your chest pain does not go away, even after treatment. °· You have a rash with blisters on your chest. °· You have a fever. °SEEK IMMEDIATE MEDICAL CARE IF:  °· You have increased chest pain or pain that spreads to your arm, neck, jaw, back, or abdomen. °· You have shortness of breath. °· You have an increasing cough, or you cough   up blood.  You have severe back or abdominal pain.  You feel nauseous or vomit.  You have severe weakness.  You faint.  You have chills. This is an emergency. Do not wait to see if the pain will go away. Get medical help at once. Call your local emergency services (911 in [place] Do not drive  yourself to the hospital. MAKE SURE YOU:   Understand these instructions.  Will watch your condition.  Will get help right away if you are not doing well or get worse. Document Released: 09/03/2005 Document Revised: 11/29/2013 Document Reviewed: 06/29/2008 [facility] Patient Information 2015 [place], [place]. This information is not intended to replace advice given to you by your health care provider. Make sure you discuss any questions you have with your health care provider.  Shortness of Breath Shortness of breath means you have trouble breathing. Shortness of breath needs medical care right away. HOME CARE   Do not smoke.  Avoid being around chemicals or things (paint fumes, dust) that may bother your breathing.  Rest as needed. Slowly begin your normal activities.  Only take medicines as told by your doctor.  Keep all doctor visits as told. GET HELP RIGHT AWAY IF:   Your shortness of breath gets worse.  You feel lightheaded, pass out (faint), or have a cough that is not helped by medicine.  You cough up blood.  You have pain with breathing.  You have pain in your chest, arms, shoulders, or belly (abdomen).  You have a fever.  You cannot walk up stairs or exercise the way you normally do.  You do not get better in the time expected.  You have a hard time doing normal activities even with rest.  You have problems with your medicines.  You have any new symptoms. MAKE SURE YOU:  Understand these instructions.  Will watch your condition.  Will get help right away if you are not doing well or get worse. Document Released: 05/12/2008 Document Revised: 11/29/2013 Document Reviewed: 02/09/2012 [facility] Patient Information 2015 [place], [place]. This information is not intended to replace advice given to you by your health care provider. Make sure you discuss any questions you have with your health care provider.

## 2014-06-25 NOTE — ED Notes (Signed)
Pt has c/o of mid sternal chest pain, N & V, dizziness and lightheadedness x 1 day.

## 2014-06-25 NOTE — ED Provider Notes (Signed)
CSN: [id number]     Arrival date & time 06/25/14  2008 History  This chart was scribed for Richarda Blade, MD by Steva Colder, ED Scribe. The patient was seen in room APA18/APA18 at 9:04 PM.      Chief Complaint  Patient presents with  . Chest Pain  . Nausea    The history is provided by the patient. No language interpreter was used.   HPI Comments: Kayla Choi is a 48 y.o. female who presents to the Emergency Department complaining of CP earlier today.  She states that she has had pain like this before. She states that she has had this pain before and it would come and go. She states that now the pain is constant and that happened around 6 PM. She states that she has SOB and coughing non-productive. She states that she is having associated symptoms of nausea, diarrhea, CP, and SOB. She states that she has had 4-5 episodes of Nausea today. She states that she had 2 episodes of diarrhea today. She states that she has had problems like this before but not this bad. She states that she has used cold compresses and laid down for the HA pain.  She states that she takes lisinopril for her BP. She states that she lives with her children. She states that she is able to walk. She states that her daughter brought her into the ED. She states that she has recently seen her PCP a couple weeks ago for a regular check up. She states that on that day she had keloids removed from her pantyline, belly button, and her chest.  She denies heart trouble. She denies drug or alcohol use. She denies smoking. She states that she just wants to know what's wrong with her.    PCP- Vic Blackbird, MD  Past Medical History  Diagnosis Date  . Hypertension   . COPD (chronic obstructive pulmonary disease)   . Hyperlipidemia   . Prediabetes   . Anxiety     with social phobia  . Anemia     iron deficinecy  . Depression   . ADD (attention deficit disorder)   . Glaucoma   . Diabetes mellitus   . Social phobia     Past Surgical History  Procedure Laterality Date  . Cholecystectomy    . Right elbow      reconstruction  . Abdominal hysterectomy      fibroids, both ovaries left intact  . Cosmetic surgery      elbow  . Fracture surgery      Recurrent elbow surgery  . Lipoma excision  04/30/2012    Procedure: EXCISION LIPOMA;  Surgeon: Donato Heinz, MD;  Location: [facility];  Service: General;  Laterality: Right;  Excision soft tissue mass right thigh   Family History  Problem Relation Age of Onset  . Hypertension Mother   . Diabetes Father   . Heart disease Father   . Anesthesia problems Neg Hx   . Malignant hyperthermia Neg Hx   . Hypotension Neg Hx   . Pseudochol deficiency Neg Hx    History  Substance Use Topics  . Smoking status: Never Smoker   . Smokeless tobacco: Never Used  . Alcohol Use: No   OB History   Grav Para Term Preterm Abortions TAB SAB Ect Mult Living   2 2 2       2      Review of Systems  Respiratory: Positive for cough and shortness of  breath.   Cardiovascular: Positive for chest pain.  Gastrointestinal: Positive for nausea and diarrhea.  All other systems reviewed and are negative.     Allergies  Aspirin; Metformin and related; Other; Latex; Neosporin; and Oxycodone  Home Medications   Prior to Admission medications   Medication Sig Start Date End Date Taking? Authorizing Provider  atorvastatin (LIPITOR) 40 MG tablet Take 1 tablet (40 mg total) by mouth daily. 05/23/14  Yes Alycia Rossetti, MD  budesonide-formoterol [facility]) 160-4.5 MCG/ACT inhaler Inhale 2 puffs into the lungs 2 (two) times daily. 07/20/13  Yes Alycia Rossetti, MD  buPROPion (WELLBUTRIN XL) 300 MG 24 hr tablet Take 1 tablet (300 mg total) by mouth daily. 07/20/13  Yes Alycia Rossetti, MD  fluticasone [facility]) 50 MCG/ACT nasal spray Place 2 sprays into the nose daily.     Yes Historical Provider, MD  lisinopril-hydrochlorothiazide (ZESTORETIC) 20-12.5 MG per tablet Take 1 tablet by  mouth daily. 07/20/13 07/20/14 Yes Alycia Rossetti, MD  loratadine (CLARITIN) 10 MG tablet Take 1 tablet (10 mg total) by mouth daily. 05/06/13  Yes Hope Bunnie Pion, NP  sertraline (ZOLOFT) 100 MG tablet Take 1 tablet (100 mg total) by mouth daily. 07/20/13  Yes Alycia Rossetti, MD  tiotropium (SPIRIVA) 18 MCG inhalation capsule Place 1 capsule (18 mcg total) into inhaler and inhale daily. 07/20/13  Yes Alycia Rossetti, MD  topiramate (TOPAMAX) 25 MG tablet Take 25 mg by mouth at bedtime.   Yes Historical Provider, MD  albuterol (PROVENTIL HFA;VENTOLIN HFA) 108 (90 BASE) MCG/ACT inhaler Inhale 2 puffs into the lungs every 6 (six) hours as needed for wheezing or shortness of breath. 07/20/13   Alycia Rossetti, MD   BP 167/117  Pulse 102  Temp(Src) 98.3 F (36.8 C) (Oral)  Ht 5\' 2"  (1.575 m)  Wt 190 lb (86.183 kg)  BMI 34.74 kg/m2  SpO2 100%  Physical Exam  Nursing note and vitals reviewed. Constitutional: She is oriented to person, place, and time. She appears well-developed and well-nourished.  HENT:  Head: Normocephalic and atraumatic.  Eyes: Conjunctivae and EOM are normal. Pupils are equal, round, and reactive to light.  Neck: Normal range of motion and phonation normal. Neck supple.  Cardiovascular: Normal rate, regular rhythm and intact distal pulses.   Pulmonary/Chest: Effort normal and breath sounds normal. She exhibits no tenderness.  Abdominal: Soft. She exhibits no distension. There is tenderness. There is no guarding.  Mild bilateral lower quadrant tenderness.   Musculoskeletal: Normal range of motion. She exhibits edema and tenderness.  1+ edema of the lower legs with some mild tenderness.  Neurological: She is alert and oriented to person, place, and time. She exhibits normal muscle tone.  Skin: Skin is warm and dry.  Psychiatric: She has a normal mood and affect. Her behavior is normal. Judgment and thought content normal.    ED Course  Procedures (including critical care  time) DIAGNOSTIC STUDIES: Oxygen Saturation is 100% on room air, normal by my interpretation.    COORDINATION OF CARE: 9:10 PM-Discussed treatment plan which includes Morphine, Zofran, Normodyne, CXR, and labs with pt at bedside and pt agreed to plan.   Medications  labetalol (NORMODYNE,TRANDATE) injection 20 mg (20 mg Intravenous Given 06/25/14 2124)  morphine 4 MG/ML injection 4 mg (4 mg Intravenous Given 06/25/14 2120)  ondansetron (ZOFRAN) injection 4 mg (4 mg Intravenous Given 06/25/14 2120)    Patient Vitals for the past 24 hrs:  BP Temp Temp src Pulse Resp  SpO2 Height Weight  06/25/14 2204 148/88 mmHg - - 76 18 97 % - -  06/25/14 2130 135/74 mmHg - - 84 16 94 % - -  06/25/14 2100 182/116 mmHg - - 100 15 100 % - -  06/25/14 2019 167/117 mmHg 98.3 F (36.8 C) Oral 102 - 100 % 5\' 2"  (1.575 m) 190 lb (86.183 kg)    10:42 PM Reevaluation with update and discussion. After initial assessment and treatment, an updated evaluation reveals the patient is getting better. Christ Kick, MD.   Labs Review Labs Reviewed  CBC - Abnormal; Notable for the following:    Platelets 415 (*)    All other components within normal limits  BASIC METABOLIC PANEL - Abnormal; Notable for the following:    Sodium 136 (*)    Glucose, Bld 165 (*)    All other components within normal limits  TROPONIN I  PRO B NATRIURETIC PEPTIDE  D-DIMER, QUANTITATIVE    Imaging Review Dg Chest Port 1 View  06/25/2014   CLINICAL DATA:  Chest pain.  EXAM: PORTABLE CHEST - 1 VIEW  COMPARISON:  May 10, 2013.  FINDINGS: The heart size and mediastinal contours are within normal limits. No pneumothorax or pleural effusion is noted. Both lungs are clear. The visualized skeletal structures are unremarkable.  IMPRESSION: No acute cardiopulmonary abnormality seen.   Electronically Signed   By: Sabino Dick M.D.   On: 06/25/2014 20:46     EKG Interpretation   Date/Time:  Sunday June 25 2014 20:17:41 EDT Ventricular Rate:   101 PR Interval:  101 QRS Duration: 71 QT Interval:  332 QTC Calculation: 430 R Axis:   58 Text Interpretation:  Sinus tachycardia Borderline T abnormalities,  inferior leads since last tracing no significant change Confirmed by Eulis Foster   MD, Isaul Landi ([id number]) on 06/25/2014 8:46:14 PM      MDM   Final diagnoses:  Nonspecific chest pain  Dyspnea    Nonspecific chest pain, and dyspnea. No evidence for ACS, PE, or pneumonia. Pain has been present for a sufficient time for rule out MI, with a single troponin. She is very low risk for cardiac problems.  Nursing Notes Reviewed/ Care Coordinated Applicable Imaging Reviewed Interpretation of Laboratory Data incorporated into ED treatment  The patient appears reasonably screened and/or stabilized for discharge and I doubt any other medical condition or other [facility] requiring further screening, evaluation, or treatment in the ED at this time prior to discharge.  Plan: Home Medications- usual Home Treatments- rest; return here if the recommended treatment, does not improve the symptoms; Recommended follow up- PCP followup in 3 days   I personally performed the services described in this documentation, which was scribed in my presence. The recorded information has been reviewed and is accurate.    Richarda Blade, MD 06/25/14 [phone]

## 2014-06-30 ENCOUNTER — Ambulatory Visit (INDEPENDENT_AMBULATORY_CARE_PROVIDER_SITE_OTHER): Payer: Medicaid Other | Admitting: Family Medicine

## 2014-06-30 ENCOUNTER — Encounter: Payer: Self-pay | Admitting: Family Medicine

## 2014-06-30 VITALS — BP 158/84 | HR 96 | Temp 98.2°F | Resp 16 | Ht 62.0 in | Wt 187.0 lb

## 2014-06-30 DIAGNOSIS — I10 Essential (primary) hypertension: Secondary | ICD-10-CM

## 2014-06-30 DIAGNOSIS — E119 Type 2 diabetes mellitus without complications: Secondary | ICD-10-CM

## 2014-06-30 DIAGNOSIS — W19XXXA Unspecified fall, initial encounter: Secondary | ICD-10-CM

## 2014-06-30 DIAGNOSIS — M62838 Other muscle spasm: Secondary | ICD-10-CM

## 2014-06-30 MED ORDER — CYCLOBENZAPRINE HCL 10 MG PO TABS: 10.0000 mg | ORAL_TABLET | Freq: Three times a day (TID) | ORAL | Status: DC | PRN

## 2014-06-30 NOTE — Patient Instructions (Addendum)
Take the benicar HCTZ once a day for blood pressure Take the Januvia once a day for diabetes Check your sugar once a day fasting Take the flexeril for your neck  F/U Next Tuesday
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
## 2014-07-02 DIAGNOSIS — M62838 Other muscle spasm: Secondary | ICD-10-CM | POA: Insufficient documentation

## 2014-07-02 DIAGNOSIS — W19XXXA Unspecified fall, initial encounter: Secondary | ICD-10-CM | POA: Insufficient documentation

## 2014-07-02 NOTE — Assessment & Plan Note (Signed)
I think this was precepitated by some dizziness she was having also a rainy day BP meds and glucose meds adjusted

## 2014-07-02 NOTE — Assessment & Plan Note (Signed)
She has not had meds consistently, elevated BP with symptoms,. Given Bencair 40-25mg  in office today , enough for 30 days, until she can get her meds, I will need to increase her lisinopril once she is able to afford it , recheck in 1 week

## 2014-07-02 NOTE — Assessment & Plan Note (Signed)
S/p injury, flexeril, NSAIDS

## 2014-07-02 NOTE — Assessment & Plan Note (Signed)
Start januvia 100mg , she has meter and strips, check CBG, goal , 120 fasting

## 2014-07-04 ENCOUNTER — Ambulatory Visit (INDEPENDENT_AMBULATORY_CARE_PROVIDER_SITE_OTHER): Payer: Medicaid Other | Admitting: Family Medicine

## 2014-07-04 ENCOUNTER — Encounter: Payer: Self-pay | Admitting: Family Medicine

## 2014-07-04 VITALS — BP 136/70 | HR 72 | Temp 98.3°F | Resp 14 | Ht 61.0 in | Wt 186.0 lb

## 2014-07-04 DIAGNOSIS — I10 Essential (primary) hypertension: Secondary | ICD-10-CM

## 2014-07-04 MED ORDER — LISINOPRIL-HYDROCHLOROTHIAZIDE 20-12.5 MG PO TABS: 2.0000 | ORAL_TABLET | Freq: Every day | ORAL | Status: DC

## 2014-07-04 NOTE — Progress Notes (Signed)
Patient ID: Kayla Choi, female   DOB: 14-Nov-1966, 48 y.o.   MRN: [id number]   Subjective:    Patient ID: Kayla Choi, female    DOB: 1966-08-06, 48 y.o.   MRN: [id number]  Patient presents for F/U HTN  Patient here for interim visit followup for hypertension. She states she feels much better with her blood pressure down. She's also taking her Januvia however has not checked her blood sugars. She's taking Benicar HCTZ samples that I gave her from last week   Review Of Systems:  GEN- denies fatigue, fever, weight loss,weakness, recent illness HEENT- denies eye drainage, change in vision, nasal discharge, CVS- denies chest pain, palpitations RESP- denies SOB, cough, wheeze Neuro- denies headache, dizziness, syncope, seizure activity       Objective:    BP 136/70  Pulse 72  Temp(Src) 98.3 F (36.8 C) (Oral)  Resp 14  Ht 5\' 1"  (1.549 m)  Wt 186 lb (84.369 kg)  BMI 35.16 kg/m2 GEN- NAD, alert and oriented x3 CVS- RRR, no murmur RESP-CTAB EXT- No edema         Assessment & Plan:      Problem List Items Addressed This Visit   None      Note: This dictation was prepared with Dragon dictation along with smaller phrase technology. Any transcriptional errors that result from this process are unintentional.

## 2014-07-04 NOTE — Patient Instructions (Signed)
Check your medicine bag and call back- see if you have the lisinopril in the bag  For now continue the benicar HCTZ once a day until you use these up F/U 3 months

## 2014-07-04 NOTE — Assessment & Plan Note (Signed)
Blood pressure much improved on recheck. For now she will continue the Benicar HCTZ until we know she has enough of her lisinopril HCTZ. The plan will be that she will continue the staples which she has about 3 weeks and then she will switch to lisinopril HCTZ 20/12.52 tablets daily as the equivalent of the Benicar

## 2014-07-21 ENCOUNTER — Encounter (HOSPITAL_COMMUNITY): Payer: Self-pay | Admitting: Emergency Medicine

## 2014-07-21 ENCOUNTER — Emergency Department (HOSPITAL_COMMUNITY)
Admission: EM | Admit: 2014-07-21 | Discharge: 2014-07-21 | Disposition: A | Discharge status: Home / Self Care | Payer: MEDICAID | Attending: Emergency Medicine | Admitting: Emergency Medicine

## 2014-07-21 DIAGNOSIS — Z79899 Other long term (current) drug therapy: Secondary | ICD-10-CM | POA: Diagnosis not present

## 2014-07-21 DIAGNOSIS — E119 Type 2 diabetes mellitus without complications: Secondary | ICD-10-CM | POA: Insufficient documentation

## 2014-07-21 DIAGNOSIS — I10 Essential (primary) hypertension: Secondary | ICD-10-CM | POA: Insufficient documentation

## 2014-07-21 DIAGNOSIS — F988 Other specified behavioral and emotional disorders with onset usually occurring in childhood and adolescence: Secondary | ICD-10-CM | POA: Insufficient documentation

## 2014-07-21 DIAGNOSIS — IMO0002 Reserved for concepts with insufficient information to code with codable children: Secondary | ICD-10-CM | POA: Diagnosis not present

## 2014-07-21 DIAGNOSIS — F411 Generalized anxiety disorder: Secondary | ICD-10-CM | POA: Insufficient documentation

## 2014-07-21 DIAGNOSIS — Z91040 Latex allergy status: Secondary | ICD-10-CM | POA: Diagnosis not present

## 2014-07-21 DIAGNOSIS — E785 Hyperlipidemia, unspecified: Secondary | ICD-10-CM | POA: Insufficient documentation

## 2014-07-21 DIAGNOSIS — J449 Chronic obstructive pulmonary disease, unspecified: Secondary | ICD-10-CM | POA: Insufficient documentation

## 2014-07-21 DIAGNOSIS — R000 Tachycardia, unspecified: Secondary | ICD-10-CM | POA: Diagnosis not present

## 2014-07-21 DIAGNOSIS — F419 Anxiety disorder, unspecified: Secondary | ICD-10-CM

## 2014-07-21 DIAGNOSIS — F410 Panic disorder [episodic paroxysmal anxiety] without agoraphobia: Secondary | ICD-10-CM | POA: Insufficient documentation

## 2014-07-21 DIAGNOSIS — H409 Unspecified glaucoma: Secondary | ICD-10-CM | POA: Diagnosis not present

## 2014-07-21 DIAGNOSIS — J4489 Other specified chronic obstructive pulmonary disease: Secondary | ICD-10-CM | POA: Insufficient documentation

## 2014-07-21 DIAGNOSIS — F3289 Other specified depressive episodes: Secondary | ICD-10-CM | POA: Diagnosis not present

## 2014-07-21 DIAGNOSIS — F329 Major depressive disorder, single episode, unspecified: Secondary | ICD-10-CM | POA: Insufficient documentation

## 2014-07-21 LAB — BASIC METABOLIC PANEL
ANION GAP: 14 (ref 5–15)
BUN: 12 mg/dL (ref 6–23)
CALCIUM: 9.5 mg/dL (ref 8.4–10.5)
CHLORIDE: 99 meq/L (ref 96–112)
CO2: 25 mEq/L (ref 19–32)
Creatinine, Ser: 0.92 mg/dL (ref 0.50–1.10)
GFR calc Af Amer: 84 mL/min — ABNORMAL LOW (ref >90)
GFR calc non Af Amer: 72 mL/min — ABNORMAL LOW (ref >90)
Glucose, Bld: 144 mg/dL — ABNORMAL HIGH (ref 70–99)
Potassium: 3.9 mEq/L (ref 3.7–5.3)
Sodium: 138 mEq/L (ref 137–147)

## 2014-07-21 LAB — CBC
HCT: 43 % (ref 36.0–46.0)
Hemoglobin: 14.4 g/dL (ref 12.0–15.0)
MCH: 31.3 pg (ref 26.0–34.0)
MCHC: 33.5 g/dL (ref 30.0–36.0)
MCV: 93.5 fL (ref 78.0–100.0)
Platelets: 445 10*3/uL — ABNORMAL HIGH (ref 150–400)
RBC: 4.6 MIL/uL (ref 3.87–5.11)
RDW: 12.9 % (ref 11.5–15.5)
WBC: 7.6 10*3/uL (ref 4.0–10.5)

## 2014-07-21 MED ORDER — HYDROXYZINE HCL 25 MG PO TABS
50.0000 mg | ORAL_TABLET | Freq: Once | ORAL | Status: AC
  Administered 2014-07-21: 50 mg via ORAL
  Filled 2014-07-21: qty 2

## 2014-07-21 MED ORDER — SODIUM CHLORIDE 0.9 % IV BOLUS (SEPSIS)
250.0000 mL | Freq: Once | INTRAVENOUS | Status: AC
  Administered 2014-07-21: 250 mL via INTRAVENOUS

## 2014-07-21 MED ORDER — LORAZEPAM 2 MG/ML IJ SOLN
1.0000 mg | Freq: Once | INTRAMUSCULAR | Status: AC
  Administered 2014-07-21: 1 mg via INTRAVENOUS
  Filled 2014-07-21: qty 1

## 2014-07-21 NOTE — ED Notes (Signed)
PT states she had a panic attack today around 1500 and took a prn ativan this morning for increased anxiety and was not effective. Pt c/o feeling jittery and anxious.
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
## 2014-07-21 NOTE — Discharge Instructions (Signed)
Panic Attacks °Panic attacks are sudden, short feelings of great fear or discomfort. You may have them for no reason when you are relaxed, when you are uneasy (anxious), or when you are sleeping.  °HOME CARE °· Take all your medicines as told. °· Check with your doctor before starting new medicines. °· Keep all doctor visits. °GET HELP IF: °· You are not able to take your medicines as told. °· Your symptoms do not get better. °· Your symptoms get worse. °GET HELP RIGHT AWAY IF: °· Your attacks seem different than your normal attacks. °· You have thoughts about hurting yourself or others. °· You take panic attack medicine and you have a side effect. °MAKE SURE YOU: °· Understand these instructions. °· Will watch your condition. °· Will get help right away if you are not doing well or get worse. °Document Released: 12/27/2010 Document Revised: 09/14/2013 Document Reviewed: 07/08/2013 °[employer]® Patient Information ©2015 [employer]. This information is not intended to replace advice given to you by your health care provider. Make sure you discuss any questions you have with your health care provider. ° °

## 2014-07-21 NOTE — ED Notes (Signed)
Pt calm, ready for discharge

## 2014-07-25 ENCOUNTER — Telehealth: Payer: Self-pay | Admitting: Family Medicine

## 2014-07-25 NOTE — Telephone Encounter (Signed)
Call returned to Farris Has, Editor, commissioning.   States that patient is being seen by program for nutritional needs and behavioral health.   Reports that patient is having difficulty managing her BP. Reports that patient states her BP runs 140/110. States that she is under a lot of stress since she is having to relocate and move out of her home. Reports that patient was seen in ER for behavioral health.   Reports that she will continue to monitor.   MD to be made aware.

## 2014-07-25 NOTE — Telephone Encounter (Signed)
Farris Has calling from [facility] is calling to discuss this patient with you, please call her back at  [phone]

## 2014-07-25 NOTE — Telephone Encounter (Signed)
Kayla Choi made aware.

## 2014-07-25 NOTE — Telephone Encounter (Signed)
Patient most likely does not have her medications at the last visit I had to give her samples. She just needs to pick up her blood pressure medications and her blood pressure will improve

## 2014-08-18 ENCOUNTER — Ambulatory Visit: Payer: Medicaid Other | Admitting: Family Medicine

## 2014-09-08 ENCOUNTER — Emergency Department (HOSPITAL_COMMUNITY): Payer: Medicaid Other

## 2014-09-08 ENCOUNTER — Emergency Department (HOSPITAL_COMMUNITY)
Admission: EM | Admit: 2014-09-08 | Discharge: 2014-09-08 | Disposition: A | Discharge status: Home / Self Care | Payer: Medicaid Other | Attending: Emergency Medicine | Admitting: Emergency Medicine

## 2014-09-08 ENCOUNTER — Encounter (HOSPITAL_COMMUNITY): Payer: Self-pay | Admitting: Emergency Medicine

## 2014-09-08 ENCOUNTER — Telehealth: Payer: Self-pay | Admitting: Family Medicine

## 2014-09-08 DIAGNOSIS — I10 Essential (primary) hypertension: Secondary | ICD-10-CM | POA: Insufficient documentation

## 2014-09-08 DIAGNOSIS — J209 Acute bronchitis, unspecified: Secondary | ICD-10-CM

## 2014-09-08 DIAGNOSIS — Z7952 Long term (current) use of systemic steroids: Secondary | ICD-10-CM | POA: Insufficient documentation

## 2014-09-08 DIAGNOSIS — Z792 Long term (current) use of antibiotics: Secondary | ICD-10-CM | POA: Insufficient documentation

## 2014-09-08 DIAGNOSIS — R0789 Other chest pain: Secondary | ICD-10-CM | POA: Insufficient documentation

## 2014-09-08 DIAGNOSIS — J441 Chronic obstructive pulmonary disease with (acute) exacerbation: Secondary | ICD-10-CM | POA: Diagnosis not present

## 2014-09-08 DIAGNOSIS — Z79899 Other long term (current) drug therapy: Secondary | ICD-10-CM | POA: Diagnosis not present

## 2014-09-08 DIAGNOSIS — F419 Anxiety disorder, unspecified: Secondary | ICD-10-CM | POA: Insufficient documentation

## 2014-09-08 DIAGNOSIS — Z862 Personal history of diseases of the blood and blood-forming organs and certain disorders involving the immune mechanism: Secondary | ICD-10-CM | POA: Insufficient documentation

## 2014-09-08 DIAGNOSIS — R0602 Shortness of breath: Secondary | ICD-10-CM | POA: Diagnosis present

## 2014-09-08 DIAGNOSIS — E119 Type 2 diabetes mellitus without complications: Secondary | ICD-10-CM | POA: Diagnosis not present

## 2014-09-08 DIAGNOSIS — Z91040 Latex allergy status: Secondary | ICD-10-CM | POA: Insufficient documentation

## 2014-09-08 DIAGNOSIS — H409 Unspecified glaucoma: Secondary | ICD-10-CM | POA: Diagnosis not present

## 2014-09-08 DIAGNOSIS — F329 Major depressive disorder, single episode, unspecified: Secondary | ICD-10-CM | POA: Diagnosis not present

## 2014-09-08 DIAGNOSIS — E785 Hyperlipidemia, unspecified: Secondary | ICD-10-CM | POA: Diagnosis not present

## 2014-09-08 LAB — CBC WITH DIFFERENTIAL/PLATELET
Basophils Absolute: 0 10*3/uL (ref 0.0–0.1)
Basophils Relative: 1 % (ref 0–1)
Eosinophils Absolute: 0.1 10*3/uL (ref 0.0–0.7)
Eosinophils Relative: 1 % (ref 0–5)
HEMATOCRIT: 39.7 % (ref 36.0–46.0)
Hemoglobin: 13.3 g/dL (ref 12.0–15.0)
LYMPHS ABS: 2 10*3/uL (ref 0.7–4.0)
LYMPHS PCT: 32 % (ref 12–46)
MCH: 31.4 pg (ref 26.0–34.0)
MCHC: 33.5 g/dL (ref 30.0–36.0)
MCV: 93.9 fL (ref 78.0–100.0)
Monocytes Absolute: 0.5 10*3/uL (ref 0.1–1.0)
Monocytes Relative: 7 % (ref 3–12)
NEUTROS ABS: 3.8 10*3/uL (ref 1.7–7.7)
Neutrophils Relative %: 59 % (ref 43–77)
PLATELETS: 397 10*3/uL (ref 150–400)
RBC: 4.23 MIL/uL (ref 3.87–5.11)
RDW: 12.7 % (ref 11.5–15.5)
WBC: 6.4 10*3/uL (ref 4.0–10.5)

## 2014-09-08 LAB — BASIC METABOLIC PANEL
Anion gap: 12 (ref 5–15)
BUN: 8 mg/dL (ref 6–23)
CHLORIDE: 104 meq/L (ref 96–112)
CO2: 26 meq/L (ref 19–32)
Calcium: 9 mg/dL (ref 8.4–10.5)
Creatinine, Ser: 0.78 mg/dL (ref 0.50–1.10)
GFR calc Af Amer: >90 mL/min (ref >90)
GFR calc non Af Amer: >90 mL/min (ref >90)
GLUCOSE: 168 mg/dL — AB (ref 70–99)
Potassium: 3.6 mEq/L — ABNORMAL LOW (ref 3.7–5.3)
SODIUM: 142 meq/L (ref 137–147)

## 2014-09-08 MED ORDER — ALBUTEROL SULFATE (2.5 MG/3ML) 0.083% IN NEBU
2.5000 mg | INHALATION_SOLUTION | Freq: Once | RESPIRATORY_TRACT | Status: AC
  Administered 2014-09-08: 2.5 mg via RESPIRATORY_TRACT

## 2014-09-08 MED ORDER — PREDNISONE 20 MG PO TABS
ORAL_TABLET | ORAL | Status: DC
Start: 2014-09-08 — End: 2014-10-06

## 2014-09-08 MED ORDER — ALBUTEROL SULFATE HFA 108 (90 BASE) MCG/ACT IN AERS: 2.0000 | INHALATION_SPRAY | RESPIRATORY_TRACT | Status: DC | PRN

## 2014-09-08 MED ORDER — ALBUTEROL SULFATE HFA 108 (90 BASE) MCG/ACT IN AERS
2.0000 | INHALATION_SPRAY | RESPIRATORY_TRACT | Status: DC | PRN
  Filled 2014-09-08: qty 6.7

## 2014-09-08 MED ORDER — ALBUTEROL SULFATE (2.5 MG/3ML) 0.083% IN NEBU: 5.0000 mg | INHALATION_SOLUTION | Freq: Once | RESPIRATORY_TRACT | Status: DC

## 2014-09-08 MED ORDER — AEROCHAMBER Z-STAT PLUS/MEDIUM MISC
1.0000 | Freq: Once | Status: DC
  Filled 2014-09-08: qty 1

## 2014-09-08 MED ORDER — IPRATROPIUM-ALBUTEROL 0.5-2.5 (3) MG/3ML IN SOLN
RESPIRATORY_TRACT | Status: AC
  Filled 2014-09-08: qty 3

## 2014-09-08 MED ORDER — AZITHROMYCIN 250 MG PO TABS
500.0000 mg | ORAL_TABLET | Freq: Once | ORAL | Status: AC
  Administered 2014-09-08: 500 mg via ORAL
  Filled 2014-09-08: qty 2

## 2014-09-08 MED ORDER — PREDNISONE 10 MG PO TABS
60.0000 mg | ORAL_TABLET | Freq: Once | ORAL | Status: AC
  Administered 2014-09-08: 60 mg via ORAL
  Filled 2014-09-08 (×2): qty 1

## 2014-09-08 MED ORDER — AZITHROMYCIN 250 MG PO TABS
ORAL_TABLET | ORAL | Status: DC
Start: 2014-09-08 — End: 2014-10-06

## 2014-09-08 MED ORDER — IPRATROPIUM BROMIDE 0.02 % IN SOLN: 0.5000 mg | Freq: Once | RESPIRATORY_TRACT | Status: DC

## 2014-09-08 MED ORDER — IPRATROPIUM-ALBUTEROL 0.5-2.5 (3) MG/3ML IN SOLN
3.0000 mL | Freq: Once | RESPIRATORY_TRACT | Status: AC
  Administered 2014-09-08: 3 mL via RESPIRATORY_TRACT

## 2014-09-08 NOTE — Telephone Encounter (Signed)
[phone] Patient has been coughing for about

## 2014-09-08 NOTE — ED Provider Notes (Signed)
CSN: [id number]     Arrival date & time 09/08/14  1644 History  This chart was scribed for No att. providers found by Delphia Grates, ED Scribe. This patient was seen in room APA07/[facility]07 and the patient's care was started at 12:51 AM.    Chief Complaint  Patient presents with  . Shortness of Breath   The history is provided by the patient. No language interpreter was used.   HPI Comments: Kayla Choi is a 48 y.o. 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 Zachery Dakins, does not smoke drink. Just moved out of a home with mold  Patient reports she started getting a cough 2-1/2 half weeks ago with dark yellow sputum but now is getting lighter yellow in color. She has shortness of breath with dyspnea on exertion. She also complains of a central chest pressure with exertion. The chest pressure goes away when she uses her inhaler.She states her inhaler ran out last night. Patient has been wheezing and states she had fever to 101 this morning. She has never had pneumonia that she is aware of. Patient does have COPD from an healing [employer] fumes.   PCP Dr Buelah Manis  Past Medical History  Diagnosis Date  . Hypertension   . COPD (chronic obstructive pulmonary disease)   . Hyperlipidemia   . Prediabetes   . Anxiety     with social phobia  . Anemia     iron deficinecy  . Depression   . ADD (attention deficit disorder)   . Glaucoma   . Diabetes mellitus   . Social phobia    Past Surgical History  Procedure Laterality Date  . Cholecystectomy    . Right elbow       reconstruction  . Abdominal hysterectomy      fibroids, both ovaries left intact  . Cosmetic surgery      elbow  . Lipoma excision  04/30/2012    Procedure: EXCISION LIPOMA;  Surgeon: Donato Heinz, MD;  Location: [facility];  Service: General;  Laterality: Right;  Excision soft tissue mass right thigh  . Fracture surgery      Recurrent elbow surgery   Family History  Problem Relation Age of Onset  . Hypertension Mother   . Diabetes Father   . Heart disease Father   . Anesthesia problems Neg Hx   . Malignant hyperthermia Neg Hx   . Hypotension Neg Hx   . Pseudochol deficiency Neg Hx    History  Substance Use Topics  . Smoking status: Never Smoker   . Smokeless tobacco: Never Used  . Alcohol Use: No   Lives at home Lives with spouse  OB History   Grav Para Term Preterm Abortions TAB SAB Ect Mult Living   2 2 2       2      Review of Systems  Respiratory: Positive for chest tightness and shortness of breath. Negative for wheezing.       Allergies  Aspirin; Metformin and related; Other; Latex; Neosporin; and Oxycodone  Home Medications   Prior to Admission medications   Medication Sig  Start Date End Date Taking? Authorizing Provider  albuterol (PROVENTIL HFA;VENTOLIN HFA) 108 (90 BASE) MCG/ACT inhaler Inhale 2 puffs into the lungs every 6 (six) hours as needed for wheezing or shortness of breath. 07/20/13  Yes Alycia Rossetti, MD  atorvastatin (LIPITOR) 40 MG tablet Take 1 tablet (40 mg total) by mouth daily. 05/23/14  Yes Alycia Rossetti, MD  budesonide-formoterol [facility]) 160-4.5 MCG/ACT inhaler Inhale 2 puffs into the lungs 2 (two) times daily. 07/20/13  Yes Alycia Rossetti, MD  buPROPion (WELLBUTRIN XL) 300 MG 24 hr tablet Take 1 tablet (300 mg total) by mouth daily. 07/20/13  Yes Alycia Rossetti, MD  fluticasone [facility]) 50 MCG/ACT nasal spray Place 2 sprays into the nose daily.     Yes Historical Provider, MD  lisinopril-hydrochlorothiazide (ZESTORETIC) 20-12.5  MG per tablet Take 2 tablets by mouth daily. 07/04/14 07/04/15 Yes Alycia Rossetti, MD  loratadine (CLARITIN) 10 MG tablet Take 1 tablet (10 mg total) by mouth daily. 05/06/13  Yes Aquilla, NP  olmesartan-hydrochlorothiazide (BENICAR HCT) 40-25 MG per tablet Take 1 tablet by mouth daily.   Yes Historical Provider, MD  sertraline (ZOLOFT) 100 MG tablet Take 1 tablet (100 mg total) by mouth daily. 07/20/13  Yes Alycia Rossetti, MD  sitaGLIPtin (JANUVIA) 100 MG tablet Take 100 mg by mouth daily.   Yes Historical Provider, MD  tiotropium (SPIRIVA) 18 MCG inhalation capsule Place 1 capsule (18 mcg total) into inhaler and inhale daily. 07/20/13  Yes Alycia Rossetti, MD  topiramate (TOPAMAX) 25 MG tablet Take 25 mg by mouth at bedtime.   Yes Historical Provider, MD  albuterol (PROVENTIL HFA;VENTOLIN HFA) 108 (90 BASE) MCG/ACT inhaler Inhale 2 puffs into the lungs every 4 (four) hours as needed. 09/08/14   Janice Norrie, MD  azithromycin (ZITHROMAX Z-PAK) 250 MG tablet Take once a day for the next 4 days. 09/08/14   Janice Norrie, MD  predniSONE (DELTASONE) 20 MG tablet Take 3 po QD x 2d starting tomorrow, then 2 po QD x 3d then 1 po QD x 3d 09/08/14   Janice Norrie, MD   BP 170/87  Pulse 93  Temp(Src) 98.5 F (36.9 C) (Oral)  Resp 22  Ht 5\' 2"  (1.575 m)  Wt 186 lb (84.369 kg)  BMI 34.01 kg/m2  SpO2 97%  Vital signs normal   Physical Exam  Nursing note and vitals reviewed. Constitutional: She is oriented to person, place, and time. She appears well-developed and well-nourished.  Non-toxic appearance. She does not appear ill. No distress.  HENT:  Head: Normocephalic and atraumatic.  Right Ear: External ear normal.  Left Ear: External ear normal.  Nose: Nose normal. No mucosal edema or rhinorrhea.  Mouth/Throat: Oropharynx is clear and moist and mucous membranes are normal. No dental abscesses or uvula swelling.  Eyes: Conjunctivae and EOM are normal. Pupils are equal, round, and reactive to light.   Neck: Normal range of motion and full passive range of motion without pain. Neck supple.  Cardiovascular: Normal rate, regular rhythm and normal heart sounds.  Exam reveals no gallop and no friction rub.   No murmur heard. Pulmonary/Chest: Effort normal. No respiratory distress. She has decreased breath sounds. She has no wheezes. She has no rhonchi. She has no rales. She exhibits no tenderness and no crepitus.    Tender to the lower sternum at costochondral junctions bilaterally. Pt is coughing   Abdominal: Soft. Normal appearance and bowel sounds are normal. She exhibits  no distension. There is no tenderness. There is no rebound and no guarding.  Musculoskeletal: Normal range of motion. She exhibits no edema and no tenderness.  Moves all extremities well.   Neurological: She is alert and oriented to person, place, and time. She has normal strength. No cranial nerve deficit.  Skin: Skin is warm, dry and intact. No rash noted. No erythema. No pallor.  Psychiatric: She has a normal mood and affect. Her speech is normal and behavior is normal. Her mood appears not anxious.    ED Course  Procedures (including critical care time)  Medications  ipratropium-albuterol (DUONEB) 0.5-2.5 (3) MG/3ML nebulizer solution (  Canceled Entry 09/08/14 2153)  albuterol (PROVENTIL HFA;VENTOLIN HFA) 108 (90 BASE) MCG/ACT inhaler 2 puff (not administered)  aerochamber Z-Stat Plus/medium 1 each (not administered)  predniSONE (DELTASONE) tablet 60 mg (60 mg Oral Given 09/08/14 2143)  albuterol (PROVENTIL) (2.5 MG/3ML) 0.083% nebulizer solution 2.5 mg (2.5 mg Nebulization Given 09/08/14 2151)  ipratropium-albuterol (DUONEB) 0.5-2.5 (3) MG/3ML nebulizer solution 3 mL (3 mLs Nebulization Given 09/08/14 2152)  azithromycin (ZITHROMAX) tablet 500 mg (500 mg Oral Given 09/08/14 2310)    DIAGNOSTIC STUDIES: Oxygen Saturation is 100% on room air, normal by my interpretation.    COORDINATION OF CARE: At 2135 Discussed  treatment plan with patient which includes a breathing treatment and Abx. Patient agrees.   Recheck after her nebulizer. Patient states she's feeling better. She has marked improved air movement. She does not have wheezing or rhonchi. She was started on Zithromax for her bronchitis, steroids for her COPD and exacerbation and she was given an inhaler to use.  Labs Review Results for orders placed during the hospital encounter of 09/08/14  CBC WITH DIFFERENTIAL      Result Value Ref Range   WBC 6.4  4.0 - 10.5 K/uL   RBC 4.23  3.87 - 5.11 MIL/uL   Hemoglobin 13.3  12.0 - 15.0 g/dL   HCT 39.7  36.0 - 46.0 %   MCV 93.9  78.0 - 100.0 fL   MCH 31.4  26.0 - 34.0 pg   MCHC 33.5  30.0 - 36.0 g/dL   RDW 12.7  11.5 - 15.5 %   Platelets 397  150 - 400 K/uL   Neutrophils Relative % 59  43 - 77 %   Neutro Abs 3.8  1.7 - 7.7 K/uL   Lymphocytes Relative 32  12 - 46 %   Lymphs Abs 2.0  0.7 - 4.0 K/uL   Monocytes Relative 7  3 - 12 %   Monocytes Absolute 0.5  0.1 - 1.0 K/uL   Eosinophils Relative 1  0 - 5 %   Eosinophils Absolute 0.1  0.0 - 0.7 K/uL   Basophils Relative 1  0 - 1 %   Basophils Absolute 0.0  0.0 - 0.1 K/uL  BASIC METABOLIC PANEL      Result Value Ref Range   Sodium 142  137 - 147 mEq/L   Potassium 3.6 (*) 3.7 - 5.3 mEq/L   Chloride 104  96 - 112 mEq/L   CO2 26  19 - 32 mEq/L   Glucose, Bld 168 (*) 70 - 99 mg/dL   BUN 8  6 - 23 mg/dL   Creatinine, Ser 0.78  0.50 - 1.10 mg/dL   Calcium 9.0  8.4 - 10.5 mg/dL   GFR calc non Af Amer >90  >90 mL/min   GFR calc Af Amer >90  >90 mL/min   Anion gap 12  5 - 15   Laboratory interpretation all normal except mild hypokalemia     Imaging Review Dg Chest 2 View  09/08/2014   CLINICAL DATA:  Cough, congestion, shortness of breath, body aches and intermittent fever to for 2.5 weeks; history of COPD  EXAM: CHEST  2 VIEW  COMPARISON:  Portable chest x-ray of June 25, 2014 and May 10, 2013.  FINDINGS: The lungs are adequately inflated. There  is no focal infiltrate. The perihilar lung markings are coarse especially on the right inferiorly. There is no pleural effusion or pneumothorax. The heart and pulmonary vascularity are normal. The trachea is midline. The bony thorax is unremarkable.  IMPRESSION: There is no acute cardiopulmonary abnormality.   Electronically Signed   By: David  Martinique   On: 09/08/2014 21:34     EKG Interpretation None      MDM   Final diagnoses:  Bronchitis with bronchospasm    Discharge Medication List as of 09/08/2014 11:08 PM    START taking these medications   Details  !! albuterol (PROVENTIL HFA;VENTOLIN HFA) 108 (90 BASE) MCG/ACT inhaler Inhale 2 puffs into the lungs every 4 (four) hours as needed., Starting 09/08/2014, Until Discontinued, Print    azithromycin (ZITHROMAX Z-PAK) 250 MG tablet Take once a day for the next 4 days., Print    predniSONE (DELTASONE) 20 MG tablet Take 3 po QD x 2d starting tomorrow, then 2 po QD x 3d then 1 po QD x 3d, Print     !! - Potential duplicate medications found. Please discuss with provider.      Plan discharge  Rolland Porter, MD, FACEP   I personally performed the services described in this documentation, which was scribed in my presence. The recorded information has been reviewed and considered.  Rolland Porter, MD, Abram Sander   Janice Norrie, MD 09/09/14 [phone]

## 2014-09-08 NOTE — ED Notes (Signed)
Cough, sob, onset 2.5 weeks ago, "thick white " sputum   Stress incontinence

## 2014-09-08 NOTE — Discharge Instructions (Signed)
Drink plenty of fluids. Use the inhaler for wheezing or shortness of breath. Take the prednisone and antibiotic until gone.  Recheck if you get a high fever, struggle to breathe or feel worse in any way.    Metered Dose Inhaler with Spacer Inhaled medicines are the basis of treatment of asthma and other breathing problems. Inhaled medicine can only be effective if used properly. Good technique assures that the medicine reaches the lungs. Your health care provider has asked you to use a spacer with your inhaler to help you take the medicine more effectively. A spacer is a plastic tube with a mouthpiece on one end and an opening that connects to the inhaler on the other end. Metered dose inhalers (MDIs) are used to deliver a variety of inhaled medicines. These include quick relief or rescue medicines (such as bronchodilators) and controller medicines (such as corticosteroids). The medicine is delivered by pushing down on a metal canister to release a set amount of spray. If you are using different kinds of inhalers, use your quick relief medicine to open the airways 10-15 minutes before using a steroid if instructed to do so by your health care provider. If you are unsure which inhalers to use and the order of using them, ask your health care provider, nurse, or respiratory therapist. HOW TO USE THE INHALER WITH A SPACER 1. Remove cap from inhaler. 2. If you are using the inhaler for the first time, you will need to prime it. Shake the inhaler for 5 seconds and release four puffs into the air, away from your face. Ask your health care provider or pharmacist if you have questions about priming your inhaler. 3. Shake inhaler for 5 seconds before each breath in (inhalation). 4. Place the open end of the spacer onto the mouthpiece of the inhaler. 5. Position the inhaler so that the top of the canister faces up and the spacer mouthpiece faces you. 6. Put your index finger on the top of the medicine canister.  Your thumb supports the bottom of the inhaler and the spacer. 7. Breathe out (exhale) normally and as completely as possible. 8. Immediately after exhaling, place the spacer between your teeth and into your mouth. Close your mouth tightly around the spacer. 9. Press the canister down with the index finger to release the medicine. 10. At the same time as the canister is pressed, inhale deeply and slowly until the lungs are completely filled. This should take 4-6 seconds. Keep your tongue down and out of the way. 11. Hold the medicine in your lungs for 5-10 seconds (10 seconds is best). This helps the medicine get into the small airways of your lungs. Exhale. 12. Repeat inhaling deeply through the spacer mouthpiece. Again hold that breath for up to 10 seconds (10 seconds is best). Exhale slowly. If it is difficult to take this second deep breath through the spacer, breathe normally several times through the spacer. Remove the spacer from your mouth. 13. Wait at least 15-30 seconds between puffs. Continue with the above steps until you have taken the number of puffs your health care provider has ordered. Do not use the inhaler more than your health care provider directs you to. 14. Remove spacer from the inhaler and place cap on inhaler. 15. Follow the directions from your health care provider or the inhaler insert for cleaning the inhaler and spacer. If you are using a steroid inhaler, rinse your mouth with water after your last puff, gargle, and spit out  the water. Do not swallow the water. AVOID:  Inhaling before or after starting the spray of medicine. It takes practice to coordinate your breathing with triggering the spray.  Inhaling through the nose (rather than the mouth) when triggering the spray. HOW TO DETERMINE IF YOUR INHALER IS FULL OR NEARLY EMPTY You cannot know when an inhaler is empty by shaking it. A few inhalers are now being made with dose counters. Ask your health care provider for  a prescription that has a dose counter if you feel you need that extra help. If your inhaler does not have a counter, ask your health care provider to help you determine the date you need to refill your inhaler. Write the refill date on a calendar or your inhaler canister. Refill your inhaler 7-10 days before it runs out. Be sure to keep an adequate supply of medicine. This includes making sure it is not expired, and you have a spare inhaler.  SEEK MEDICAL CARE IF:   Symptoms are only partially relieved with your inhaler.  You are having trouble using your inhaler.  You experience some increase in phlegm. SEEK IMMEDIATE MEDICAL CARE IF:   You feel little or no relief with your inhalers. You are still wheezing and are feeling shortness of breath or tightness in your chest or both.  You have dizziness, headaches, or fast heart rate.  You have chills, fever, or night sweats.  There is a noticeable increase in phlegm production, or there is blood in the phlegm. Document Released: 11/24/2005 Document Revised: 04/10/2014 Document Reviewed: 05/12/2013 [facility] Patient Information 2015 [place], [place]. This information is not intended to replace advice given to you by your health care provider. Make sure you discuss any questions you have with your health care provider.  Acute Bronchitis Bronchitis is inflammation of the airways that extend from the windpipe into the lungs (bronchi). The inflammation often causes mucus to develop. This leads to a cough, which is the most common symptom of bronchitis.  In acute bronchitis, the condition usually develops suddenly and goes away over time, usually in a couple weeks. Smoking, allergies, and asthma can make bronchitis worse. Repeated episodes of bronchitis may cause further lung problems.  CAUSES Acute bronchitis is most often caused by the same virus that causes a cold. The virus can spread from person to person (contagious) through coughing, sneezing,  and touching contaminated objects. SIGNS AND SYMPTOMS   Cough.   Fever.   Coughing up mucus.   Body aches.   Chest congestion.   Chills.   Shortness of breath.   Sore throat.  DIAGNOSIS  Acute bronchitis is usually diagnosed through a physical exam. Your health care provider will also ask you questions about your medical history. Tests, such as chest X-rays, are sometimes done to rule out other conditions.  TREATMENT  Acute bronchitis usually goes away in a couple weeks. Oftentimes, no medical treatment is necessary. Medicines are sometimes given for relief of fever or cough. Antibiotic medicines are usually not needed but may be prescribed in certain situations. In some cases, an inhaler may be recommended to help reduce shortness of breath and control the cough. A cool mist vaporizer may also be used to help thin bronchial secretions and make it easier to clear the chest.  HOME CARE INSTRUCTIONS  Get plenty of rest.   Drink enough fluids to keep your urine clear or pale yellow (unless you have a medical condition that requires fluid restriction). Increasing fluids may help thin  your respiratory secretions (sputum) and reduce chest congestion, and it will prevent dehydration.   Take medicines only as directed by your health care provider.  If you were prescribed an antibiotic medicine, finish it all even if you start to feel better.  Avoid smoking and secondhand smoke. Exposure to cigarette smoke or irritating chemicals will make bronchitis worse. If you are a smoker, consider using nicotine gum or skin patches to help control withdrawal symptoms. Quitting smoking will help your lungs heal faster.   Reduce the chances of another bout of acute bronchitis by washing your hands frequently, avoiding people with cold symptoms, and trying not to touch your hands to your mouth, nose, or eyes.   Keep all follow-up visits as directed by your health care provider.  SEEK  MEDICAL CARE IF: Your symptoms do not improve after 1 week of treatment.  SEEK IMMEDIATE MEDICAL CARE IF:  You develop an increased fever or chills.   You have chest pain.   You have severe shortness of breath.  You have bloody sputum.   You develop dehydration.  You faint or repeatedly feel like you are going to pass out.  You develop repeated vomiting.  You develop a severe headache. MAKE SURE YOU:   Understand these instructions.  Will watch your condition.  Will get help right away if you are not doing well or get worse. Document Released: 01/01/2005 Document Revised: 04/10/2014 Document Reviewed: 05/17/2013 [facility] Patient Information 2015 [place], [place]. This information is not intended to replace advice given to you by your health care provider. Make sure you discuss any questions you have with your health care provider.

## 2014-09-08 NOTE — ED Notes (Signed)
Pt alert & oriented x4, stable gait. Patient given discharge instructions, paperwork & prescription(s). Patient  instructed to stop at the registration desk to finish any additional paperwork. Patient verbalized understanding. Pt left department w/ no further questions. 

## 2014-10-04 ENCOUNTER — Telehealth: Payer: Self-pay | Admitting: *Deleted

## 2014-10-04 MED ORDER — SITAGLIPTIN PHOSPHATE 100 MG PO TABS: 100.0000 mg | ORAL_TABLET | Freq: Every day | ORAL | Status: DC

## 2014-10-04 NOTE — Telephone Encounter (Signed)
Received email from pharmacy requesting PA for Januvia.   Patient has tried and failed Metformin. A1C is controlled on Januvia at 6.6.  Dx: E11.9.  PA submitted.

## 2014-10-04 NOTE — Telephone Encounter (Signed)
Received call from pharmacy.   Requested refill on Januvia and diabetic supplies.   VO for diabetic supplies given.   Prescription sent to pharmacy.

## 2014-10-06 ENCOUNTER — Ambulatory Visit (INDEPENDENT_AMBULATORY_CARE_PROVIDER_SITE_OTHER): Payer: Medicaid Other | Admitting: Family Medicine

## 2014-10-06 ENCOUNTER — Encounter: Payer: Self-pay | Admitting: Family Medicine

## 2014-10-06 VITALS — BP 166/98 | HR 68 | Temp 98.5°F | Resp 18 | Wt 191.0 lb

## 2014-10-06 DIAGNOSIS — E119 Type 2 diabetes mellitus without complications: Secondary | ICD-10-CM

## 2014-10-06 DIAGNOSIS — M25569 Pain in unspecified knee: Secondary | ICD-10-CM | POA: Insufficient documentation

## 2014-10-06 DIAGNOSIS — F411 Generalized anxiety disorder: Secondary | ICD-10-CM

## 2014-10-06 DIAGNOSIS — I10 Essential (primary) hypertension: Secondary | ICD-10-CM

## 2014-10-06 DIAGNOSIS — J410 Simple chronic bronchitis: Secondary | ICD-10-CM

## 2014-10-06 DIAGNOSIS — M25562 Pain in left knee: Secondary | ICD-10-CM

## 2014-10-06 DIAGNOSIS — E785 Hyperlipidemia, unspecified: Secondary | ICD-10-CM

## 2014-10-06 DIAGNOSIS — Z23 Encounter for immunization: Secondary | ICD-10-CM

## 2014-10-06 LAB — COMPREHENSIVE METABOLIC PANEL
ALBUMIN: 3.7 g/dL (ref 3.5–5.2)
ALT: 12 U/L (ref 0–35)
AST: 13 U/L (ref 0–37)
Alkaline Phosphatase: 39 U/L (ref 39–117)
BUN: 10 mg/dL (ref 6–23)
CALCIUM: 9 mg/dL (ref 8.4–10.5)
CHLORIDE: 104 meq/L (ref 96–112)
CO2: 27 meq/L (ref 19–32)
Creat: 0.79 mg/dL (ref 0.50–1.10)
Glucose, Bld: 127 mg/dL — ABNORMAL HIGH (ref 70–99)
POTASSIUM: 4.3 meq/L (ref 3.5–5.3)
SODIUM: 138 meq/L (ref 135–145)
Total Bilirubin: 1 mg/dL (ref 0.2–1.2)
Total Protein: 6.6 g/dL (ref 6.0–8.3)

## 2014-10-06 LAB — CBC WITH DIFFERENTIAL/PLATELET
Basophils Absolute: 0.1 10*3/uL (ref 0.0–0.1)
Basophils Relative: 1 % (ref 0–1)
EOS PCT: 1 % (ref 0–5)
Eosinophils Absolute: 0.1 10*3/uL (ref 0.0–0.7)
HCT: 40.7 % (ref 36.0–46.0)
Hemoglobin: 13.4 g/dL (ref 12.0–15.0)
Lymphocytes Relative: 34 % (ref 12–46)
Lymphs Abs: 2.1 10*3/uL (ref 0.7–4.0)
MCH: 30.6 pg (ref 26.0–34.0)
MCHC: 32.9 g/dL (ref 30.0–36.0)
MCV: 92.9 fL (ref 78.0–100.0)
Monocytes Absolute: 0.4 10*3/uL (ref 0.1–1.0)
Monocytes Relative: 6 % (ref 3–12)
NEUTROS ABS: 3.7 10*3/uL (ref 1.7–7.7)
Neutrophils Relative %: 58 % (ref 43–77)
PLATELETS: 371 10*3/uL (ref 150–400)
RBC: 4.38 MIL/uL (ref 3.87–5.11)
RDW: 13.1 % (ref 11.5–15.5)
WBC: 6.3 10*3/uL (ref 4.0–10.5)

## 2014-10-06 LAB — MICROALBUMIN / CREATININE URINE RATIO
CREATININE, URINE: 88.3 mg/dL
Microalb Creat Ratio: 40.8 mg/g — ABNORMAL HIGH (ref 0.0–30.0)
Microalb, Ur: 3.6 mg/dL — ABNORMAL HIGH (ref <2.0)

## 2014-10-06 LAB — LIPID PANEL
CHOLESTEROL: 194 mg/dL (ref 0–200)
HDL: 54 mg/dL (ref >39)
LDL Cholesterol: 126 mg/dL — ABNORMAL HIGH (ref 0–99)
Total CHOL/HDL Ratio: 3.6 Ratio
Triglycerides: 71 mg/dL (ref <150)
VLDL: 14 mg/dL (ref 0–40)

## 2014-10-06 LAB — HEMOGLOBIN A1C
Hgb A1c MFr Bld: 6.7 % — ABNORMAL HIGH (ref <5.7)
Mean Plasma Glucose: 146 mg/dL — ABNORMAL HIGH (ref <117)

## 2014-10-06 MED ORDER — AMLODIPINE BESYLATE 5 MG PO TABS: 5.0000 mg | ORAL_TABLET | Freq: Every day | ORAL | Status: DC

## 2014-10-06 MED ORDER — ALBUTEROL SULFATE (2.5 MG/3ML) 0.083% IN NEBU: 2.5000 mg | INHALATION_SOLUTION | RESPIRATORY_TRACT | Status: DC | PRN

## 2014-10-06 MED ORDER — MELOXICAM 7.5 MG PO TABS: 7.5000 mg | ORAL_TABLET | Freq: Every day | ORAL | Status: DC

## 2014-10-06 NOTE — Progress Notes (Signed)
Patient ID: Kayla Choi, female   DOB: 19-Nov-1966, 48 y.o.   MRN: [id number]   Subjective:    Patient ID: Kayla Choi, female    DOB: November 19, 1966, 48 y.o.   MRN: [id number]  Patient presents for 3 mth check up/ED follow up  Patient here to follow-up chronic medical problems. She's been seen in the ED twice 1 for a panic attack after her landlord told her that she needed to move out of the house the second time it was for bronchitis. She's had difficulties maintaining her medications due to finances but states that she has them all right now. She still being followed at mental health and went [facility] [place].  Diabetes mellitus did not bring her meter with her today she is due for repeat A1c no hypoglycemia symptoms  Hypertension she's had quite elevated blood pressures over the past couple months he will she was in the ER her blood pressure was quite elevated she states that she is taking lisinopril HCTZ as prescribed.  Patient also complains of bilateral knee pain on and off she's not had any swelling she states that it didn't lock up on her warts. She is not taking any medications for the pain she also occasionally gets right arm pain   Review Of Systems:  GEN- denies fatigue, fever, weight loss,weakness, recent illness HEENT- denies eye drainage, change in vision, nasal discharge, CVS- denies chest pain, palpitations RESP- denies SOB, cough, wheeze ABD- denies N/V, change in stools, abd pain GU- denies dysuria, hematuria, dribbling, incontinence MSK- + joint pain, muscle aches, injury Neuro- denies headache, dizziness, syncope, seizure activity       Objective:    BP 166/98  Pulse 68  Temp(Src) 98.5 F (36.9 C) (Oral)  Resp 18  Wt 191 lb (86.637 kg) GEN- NAD, alert and oriented x3 HEENT- PERRL, EOMI, non injected sclera, pink conjunctiva, MMM, oropharynx clear Neck- Supple, no thyromegaly CVS- RRR, no murmur RESP-CTAB MSK- Bilat knee- normal inspection, good ROM,  +crepitus EXT- No edema Pulses- Radial, DP- 2+        Assessment & Plan:      Problem List Items Addressed This Visit   Knee pain   Hyperlipidemia   Relevant Medications      amLODIpine (NORVASC) tablet   Other Relevant Orders      Lipid panel   Essential hypertension   Relevant Medications      amLODIpine (NORVASC) tablet   Other Relevant Orders      Comprehensive metabolic panel      CBC with Differential   Diabetes mellitus, type II   Relevant Orders      Hemoglobin A1c      Microalbumin / creatinine urine ratio   COPD (chronic obstructive pulmonary disease) - Primary   Anxiety state    Other Visit Diagnoses   Need for prophylactic vaccination and inoculation against influenza        Relevant Orders       Flu Vaccine QUAD 36+ mos PF IM (Fluarix Quad PF) (Completed)       Note: This dictation was prepared with Dragon dictation along with smaller phrase technology. Any transcriptional errors that result from this process are unintentional.

## 2014-10-06 NOTE — Assessment & Plan Note (Addendum)
flu shot Given neb for acute exacerations Given spiriva respimat to try, she does not like the taste of the capsule

## 2014-10-06 NOTE — Assessment & Plan Note (Signed)
NSAIDS BID, ICE knee, hold on imaging may have some early OA

## 2014-10-06 NOTE — Assessment & Plan Note (Signed)
Blood pressure is uncontrolled we'll add on amlodipine 5 mg

## 2014-10-06 NOTE — Assessment & Plan Note (Signed)
Repeat A1c last one was within normal limits goal is A1c less than 7%

## 2014-10-06 NOTE — Patient Instructions (Signed)
Start new blood pressure medication norvasc Continue inhalers  Nebulizer machine sent  Flu shot given F/U 6 weeks

## 2014-10-09 ENCOUNTER — Other Ambulatory Visit: Payer: Self-pay | Admitting: *Deleted

## 2014-10-09 ENCOUNTER — Encounter: Payer: Self-pay | Admitting: Family Medicine

## 2014-10-09 MED ORDER — ATORVASTATIN CALCIUM 80 MG PO TABS: 80.0000 mg | ORAL_TABLET | Freq: Every day | ORAL | Status: DC

## 2014-10-10 NOTE — Telephone Encounter (Signed)
Received PA determination.   PA approved.  

## 2014-11-05 ENCOUNTER — Encounter (HOSPITAL_COMMUNITY): Payer: Self-pay | Admitting: Emergency Medicine

## 2014-11-05 ENCOUNTER — Emergency Department (HOSPITAL_COMMUNITY): Payer: Medicaid Other

## 2014-11-05 ENCOUNTER — Emergency Department (HOSPITAL_COMMUNITY)
Admission: EM | Admit: 2014-11-05 | Discharge: 2014-11-05 | Disposition: A | Discharge status: Home / Self Care | Payer: Medicaid Other | Attending: Emergency Medicine | Admitting: Emergency Medicine

## 2014-11-05 DIAGNOSIS — Z79899 Other long term (current) drug therapy: Secondary | ICD-10-CM | POA: Diagnosis not present

## 2014-11-05 DIAGNOSIS — S20212A Contusion of left front wall of thorax, initial encounter: Secondary | ICD-10-CM | POA: Diagnosis not present

## 2014-11-05 DIAGNOSIS — W108XXA Fall (on) (from) other stairs and steps, initial encounter: Secondary | ICD-10-CM | POA: Insufficient documentation

## 2014-11-05 DIAGNOSIS — I10 Essential (primary) hypertension: Secondary | ICD-10-CM | POA: Diagnosis not present

## 2014-11-05 DIAGNOSIS — E119 Type 2 diabetes mellitus without complications: Secondary | ICD-10-CM | POA: Diagnosis not present

## 2014-11-05 DIAGNOSIS — F419 Anxiety disorder, unspecified: Secondary | ICD-10-CM | POA: Insufficient documentation

## 2014-11-05 DIAGNOSIS — Y998 Other external cause status: Secondary | ICD-10-CM | POA: Diagnosis not present

## 2014-11-05 DIAGNOSIS — F909 Attention-deficit hyperactivity disorder, unspecified type: Secondary | ICD-10-CM | POA: Diagnosis not present

## 2014-11-05 DIAGNOSIS — Z7951 Long term (current) use of inhaled steroids: Secondary | ICD-10-CM | POA: Diagnosis not present

## 2014-11-05 DIAGNOSIS — F329 Major depressive disorder, single episode, unspecified: Secondary | ICD-10-CM | POA: Diagnosis not present

## 2014-11-05 DIAGNOSIS — M25519 Pain in unspecified shoulder: Secondary | ICD-10-CM

## 2014-11-05 DIAGNOSIS — Y9389 Activity, other specified: Secondary | ICD-10-CM | POA: Insufficient documentation

## 2014-11-05 DIAGNOSIS — S4992XA Unspecified injury of left shoulder and upper arm, initial encounter: Secondary | ICD-10-CM | POA: Diagnosis not present

## 2014-11-05 DIAGNOSIS — S63502A Unspecified sprain of left wrist, initial encounter: Secondary | ICD-10-CM | POA: Insufficient documentation

## 2014-11-05 DIAGNOSIS — J449 Chronic obstructive pulmonary disease, unspecified: Secondary | ICD-10-CM | POA: Insufficient documentation

## 2014-11-05 DIAGNOSIS — Z791 Long term (current) use of non-steroidal anti-inflammatories (NSAID): Secondary | ICD-10-CM | POA: Insufficient documentation

## 2014-11-05 DIAGNOSIS — Z862 Personal history of diseases of the blood and blood-forming organs and certain disorders involving the immune mechanism: Secondary | ICD-10-CM | POA: Insufficient documentation

## 2014-11-05 DIAGNOSIS — E785 Hyperlipidemia, unspecified: Secondary | ICD-10-CM | POA: Insufficient documentation

## 2014-11-05 DIAGNOSIS — Z91040 Latex allergy status: Secondary | ICD-10-CM | POA: Insufficient documentation

## 2014-11-05 DIAGNOSIS — Y9289 Other specified places as the place of occurrence of the external cause: Secondary | ICD-10-CM | POA: Insufficient documentation

## 2014-11-05 DIAGNOSIS — W19XXXA Unspecified fall, initial encounter: Secondary | ICD-10-CM

## 2014-11-05 LAB — CBG MONITORING, ED: Glucose-Capillary: 258 mg/dL — ABNORMAL HIGH (ref 70–99)

## 2014-11-05 MED ORDER — OXYCODONE-ACETAMINOPHEN 5-325 MG PO TABS
1.0000 | ORAL_TABLET | Freq: Once | ORAL | Status: AC
  Administered 2014-11-05: 1 via ORAL
  Filled 2014-11-05: qty 1

## 2014-11-05 MED ORDER — DIPHENHYDRAMINE HCL 25 MG PO CAPS
25.0000 mg | ORAL_CAPSULE | Freq: Once | ORAL | Status: AC
  Administered 2014-11-05: 25 mg via ORAL
  Filled 2014-11-05: qty 1

## 2014-11-05 MED ORDER — HYDROCODONE-ACETAMINOPHEN 5-325 MG PO TABS: ORAL_TABLET | ORAL | Status: DC

## 2014-11-05 NOTE — ED Notes (Addendum)
Patient c/o left shoulder after falling down steps this morning. Per patient felt and heard a pop in left shoulder. Denies hitting head or LOC. Per patient fell due to dizziness-patient states "always has this dizziness first thing in the morning when she gets up-side effect to medication she takes." Denies any dizziness now.

## 2014-11-05 NOTE — ED Provider Notes (Signed)
CSN: [id number]     Arrival date & time 11/05/14  0865 History  This chart was scribed for non-physician practitioner, Kem Parkinson, PA-C,working with [place], DO, by Marlowe Kays, ED Scribe. This patient was seen in room [facility] and the patient's care was started at 7:46 PM.  Chief Complaint  Patient presents with  . Shoulder Pain   Patient is a 48 y.o. female presenting with shoulder pain. The history is provided by the patient. No language interpreter was used.  Shoulder Pain Associated symptoms: no back pain, no fatigue, no fever and no neck pain     HPI Comments:  Kayla Choi is a 48 y.o. female who presents to the Emergency Department complaining of severe left shoulder and left wrist pain that started secondary to falling down her stairs earlier this morning. Reports associated paresthesias of the fingers of the left hand and mild left sided rib pain. She reports that she normally feels dizzy in the mornings and slid down approximately 8 stairs, causing her arm to be stretched out over her head and wedged in between the railings. She reports being able to get up and ambulate without assistance after the fall and dizziness has resolved. She reports icing the area and taking Tylenol with no relief of the pain. Movement of the left arm makes the pain worse. Deep breathing makes the rib pain worse. She denies alleviating factors. Denies LOC, head injury, numbness, weakness, headaches, nausea, vomiting, SOB, current dizziness or left elbow pain.   Past Medical History  Diagnosis Date  . Hypertension   . COPD (chronic obstructive pulmonary disease)   . Hyperlipidemia   . Prediabetes   . Anxiety     with social phobia  . Anemia     iron deficinecy  . Depression   . ADD (attention deficit disorder)   . Glaucoma   . Diabetes mellitus   . Social phobia    Past Surgical History  Procedure Laterality Date  . Cholecystectomy    . Right elbow      reconstruction  .  Abdominal hysterectomy      fibroids, both ovaries left intact  . Cosmetic surgery      elbow  . Lipoma excision  04/30/2012    Procedure: EXCISION LIPOMA;  Surgeon: Donato Heinz, MD;  Location: [facility];  Service: General;  Laterality: Right;  Excision soft tissue mass right thigh  . Fracture surgery      Recurrent elbow surgery   Family History  Problem Relation Age of Onset  . Hypertension Mother   . Diabetes Father   . Heart disease Father   . Anesthesia problems Neg Hx   . Malignant hyperthermia Neg Hx   . Hypotension Neg Hx   . Pseudochol deficiency Neg Hx    History  Substance Use Topics  . Smoking status: Never Smoker   . Smokeless tobacco: Never Used  . Alcohol Use: No   OB History    Gravida Para Term Preterm AB TAB SAB Ectopic Multiple Living   2 2 2       2      Review of Systems  Constitutional: Negative for fever, chills and fatigue.  HENT: Negative for sore throat and trouble swallowing.   Respiratory: Negative for cough, shortness of breath and wheezing.   Cardiovascular: Negative for chest pain and palpitations.  Gastrointestinal: Negative for nausea, vomiting, abdominal pain and blood in stool.  Genitourinary: Negative for dysuria, hematuria and flank pain.  Musculoskeletal:  Positive for arthralgias. Negative for myalgias, back pain, neck pain and neck stiffness.  Skin: Negative for color change, rash and wound.  Neurological: Negative for dizziness, syncope, weakness and numbness.  Hematological: Does not bruise/bleed easily.    Allergies  Aspirin; Metformin and related; Other; Latex; Neosporin; and Oxycodone  Home Medications   Prior to Admission medications   Medication Sig Start Date End Date Taking? Authorizing Provider  albuterol (PROVENTIL HFA;VENTOLIN HFA) 108 (90 BASE) MCG/ACT inhaler Inhale 2 puffs into the lungs every 6 (six) hours as needed for wheezing or shortness of breath. 07/20/13   Alycia Rossetti, MD  albuterol (PROVENTIL) (2.5  MG/3ML) 0.083% nebulizer solution Take 3 mLs (2.5 mg total) by nebulization every 4 (four) hours as needed for wheezing or shortness of breath. 10/06/14   Alycia Rossetti, MD  amLODipine (NORVASC) 5 MG tablet Take 1 tablet (5 mg total) by mouth daily. 10/06/14   Alycia Rossetti, MD  atorvastatin (LIPITOR) 80 MG tablet Take 1 tablet (80 mg total) by mouth daily. 10/09/14   Alycia Rossetti, MD  budesonide-formoterol [facility]) 160-4.5 MCG/ACT inhaler Inhale 2 puffs into the lungs 2 (two) times daily. 07/20/13   Alycia Rossetti, MD  buPROPion (WELLBUTRIN XL) 300 MG 24 hr tablet Take 1 tablet (300 mg total) by mouth daily. 07/20/13   Alycia Rossetti, MD  fluticasone (FLONASE) 50 MCG/ACT nasal spray Place 2 sprays into the nose daily.      Historical Provider, MD  lisinopril-hydrochlorothiazide (ZESTORETIC) 20-12.5 MG per tablet Take 2 tablets by mouth daily. 07/04/14 07/04/15  Alycia Rossetti, MD  meloxicam (MOBIC) 7.5 MG tablet Take 1 tablet (7.5 mg total) by mouth daily. 10/06/14   Alycia Rossetti, MD  sertraline (ZOLOFT) 100 MG tablet Take 1 tablet (100 mg total) by mouth daily. 07/20/13   Alycia Rossetti, MD  sitaGLIPtin (JANUVIA) 100 MG tablet Take 1 tablet (100 mg total) by mouth daily. 10/04/14   Alycia Rossetti, MD  tiotropium (SPIRIVA) 18 MCG inhalation capsule Place 1 capsule (18 mcg total) into inhaler and inhale daily. 07/20/13   Alycia Rossetti, MD  topiramate (TOPAMAX) 25 MG tablet Take 25 mg by mouth at bedtime.    Historical Provider, MD   Triage Vitals: BP 127/65 mmHg  Pulse 87  Temp(Src) 98.5 F (36.9 C) (Oral)  Resp 20  Ht 5\' 1"  (1.549 m)  Wt 190 lb (86.183 kg)  BMI 35.92 kg/m2  SpO2 100% Physical Exam  Constitutional: She is oriented to person, place, and time. She appears well-developed and well-nourished.  HENT:  Head: Normocephalic and atraumatic.  Eyes: EOM are normal.  Neck: Normal range of motion.  Cardiovascular: Normal rate, regular rhythm and normal heart  sounds.  Exam reveals no gallop and no friction rub.   No murmur heard. Radial pulse intact.  Pulmonary/Chest: Effort normal and breath sounds normal. No respiratory distress. She has no wheezes. She has no rales.  Localized tenderness to lateral upper left ribs. No crepitus.  Musculoskeletal: Normal range of motion.  Tender in left AC joint. No bony deformity or step off. Pt unable to abduct due to pain. Tender to dorsal left wrist.  No cervical tenderness  Neurological: She is alert and oriented to person, place, and time. She exhibits normal muscle tone. Coordination normal.  Distal sensations intact.  Skin: Skin is warm and dry.  Psychiatric: She has a normal mood and affect. Her behavior is normal.  Nursing note and vitals reviewed.  ED Course  Procedures (including critical care time) DIAGNOSTIC STUDIES: Oxygen Saturation is 100% on RA, normal by my interpretation.   COORDINATION OF CARE: 7:54 PM- Informed pt that left shoulder X-ray is negative. Will order X-Rays of left-sided ribs and left wrist. Will order pain medication. Pt verbalizes understanding and agrees to plan.  Medications - No data to display  Labs Review Labs Reviewed  CBG MONITORING, ED - Abnormal; Notable for the following:    Glucose-Capillary 258 (*)    All other components within normal limits    Imaging Review Dg Ribs Unilateral W/chest Left  11/05/2014   CLINICAL DATA:  Left rib pain after falling down steps this morning.  EXAM: LEFT RIBS AND CHEST - 3+ VIEW  COMPARISON:  Chest dated 09/08/2014.  FINDINGS: Normal sized heart. Clear lungs. Mild scoliosis. No rib fracture or pneumothorax seen. Cholecystectomy clips.  IMPRESSION: No acute abnormality.   Electronically Signed   By: Enrique Sack M.D.   On: 11/05/2014 20:32   Dg Wrist Complete Left  11/05/2014   CLINICAL DATA:  Fall down steps this morning. Left wrist pain. Initial encounter.  EXAM: LEFT WRIST - COMPLETE 3+ VIEW  COMPARISON:  None.   FINDINGS: There is no evidence of fracture or dislocation. There is no evidence of arthropathy or other focal bone abnormality. Soft tissues are unremarkable.  IMPRESSION: Negative.   Electronically Signed   By: Logan Bores   On: 11/05/2014 20:32   Dg Shoulder Left  11/05/2014   CLINICAL DATA:  Slipped on stairs this morning due to dizziness. Shoulder pain. Left arm got caught in between a railing. Initial encounter.  EXAM: LEFT SHOULDER - 2+ VIEW  COMPARISON:  None.  FINDINGS: There is no evidence of fracture or dislocation. There is no evidence of arthropathy or other focal bone abnormality. Soft tissues are unremarkable.  IMPRESSION: Negative.   Electronically Signed   By: Logan Bores   On: 11/05/2014 18:53     EKG Interpretation None      MDM   Final diagnoses:  Fall  Sprain of wrist, left, initial encounter  Shoulder injury, left, initial encounter  Contusion, chest wall, left, initial encounter    Patient is ambulatory with steady gait.  No focal neuro deficits.  Likely strain of the shoulder, possible rotator cuff injury also discussed.  Pt agrees to close f/u with orthopedics.  Reported dizziness earlier that is recurrent, denies dizziness here, she agrees to close f/u with PMD.  She is feeling better and appears stable for d/c    I personally performed the services described in this documentation, which was scribed in my presence. The recorded information has been reviewed and is accurate.    Monai Hindes L. Vanessa Newtok, PA-C 11/07/14 [place], DO 11/08/14 1635

## 2014-11-05 NOTE — Discharge Instructions (Signed)
Chest Contusion A contusion is a deep bruise. Bruises happen when an injury causes bleeding under the skin. Signs of bruising include pain, puffiness (swelling), and discolored skin. The bruise may turn blue, purple, or yellow.  HOME CARE  Put ice on the injured area.  Put ice in a plastic bag.  Place a towel between the skin and the bag.  Leave the ice on for 15-20 minutes at a time, 03-04 times a day for the first 48 hours.  Only take medicine as told by your doctor.  Rest.  Take deep breaths (deep-breathing exercises) as told by your doctor.  Stop smoking if you smoke.  Do not lift objects over 5 pounds (2.3 kilograms) for 3 days or longer if told by your doctor. GET HELP RIGHT AWAY IF:   You have more bruising or puffiness.  You have pain that gets worse.  You have trouble breathing.  You are dizzy, weak, or pass out (faint).  You have blood in your pee (urine) or poop (stool).  You cough up or throw up (vomit) blood.  Your puffiness or pain is not helped with medicines. MAKE SURE YOU:   Understand these instructions.  Will watch your condition.  Will get help right away if you are not doing well or get worse. Document Released: 05/12/2008 Document Revised: 08/18/2012 Document Reviewed: 05/17/2012 [facility] Patient Information 2015 [place], [place]. This information is not intended to replace advice given to you by your health care provider. Make sure you discuss any questions you have with your health care provider.  Shoulder Pain The shoulder is the joint that connects your arm to your body. Muscles and band-like tissues that connect bones to muscles (tendons) hold the joint together. Shoulder pain is felt if an injury or medical problem affects one or more parts of the shoulder. HOME CARE   Put ice on the sore area.  Put ice in a plastic bag.  Place a towel between your skin and the bag.  Leave the ice on for 15-20 minutes, 03-04 times a day for the first  2 days.  Stop using cold packs if they do not help with the pain.  If you were given something to keep your shoulder from moving (sling; shoulder immobilizer), wear it as told. Only take it off to shower or bathe.  Move your arm as little as possible, but keep your hand moving to prevent puffiness (swelling).  Squeeze a soft ball or foam pad as much as possible to help prevent swelling.  Take medicine as told by your doctor. GET HELP IF:  You have progressing new pain in your arm, hand, or fingers.  Your hand or fingers get cold.  Your medicine does not help lessen your pain. GET HELP RIGHT AWAY IF:   Your arm, hand, or fingers are numb or tingling.  Your arm, hand, or fingers are puffy (swollen), painful, or turn white or blue. MAKE SURE YOU:   Understand these instructions.  Will watch your condition.  Will get help right away if you are not doing well or get worse. Document Released: 05/12/2008 Document Revised: 04/10/2014 Document Reviewed: 06/07/2012 [facility] Patient Information 2015 [place], [place]. This information is not intended to replace advice given to you by your health care provider. Make sure you discuss any questions you have with your health care provider.

## 2014-11-10 ENCOUNTER — Ambulatory Visit (INDEPENDENT_AMBULATORY_CARE_PROVIDER_SITE_OTHER): Payer: Medicaid Other | Admitting: Family Medicine

## 2014-11-10 ENCOUNTER — Encounter: Payer: Self-pay | Admitting: Family Medicine

## 2014-11-10 VITALS — BP 134/70 | HR 72 | Temp 98.5°F | Resp 16 | Ht 62.0 in | Wt 193.0 lb

## 2014-11-10 DIAGNOSIS — S66912D Strain of unspecified muscle, fascia and tendon at wrist and hand level, left hand, subsequent encounter: Secondary | ICD-10-CM

## 2014-11-10 DIAGNOSIS — S46012S Strain of muscle(s) and tendon(s) of the rotator cuff of left shoulder, sequela: Secondary | ICD-10-CM

## 2014-11-10 MED ORDER — TRAMADOL HCL 50 MG PO TABS: 50.0000 mg | ORAL_TABLET | Freq: Three times a day (TID) | ORAL | Status: DC | PRN

## 2014-11-10 NOTE — Patient Instructions (Signed)
Try the ultram as needed for pain Continue meloxicam  Ice your wrist and shoulder Call me in 1 week, if not better, appointment  Will be scheduled F/U as previous

## 2014-11-10 NOTE — Progress Notes (Signed)
Patient ID: Kayla Choi, female   DOB: [birth year], 48 y.o.   MRN: [id number]   Subjective:    Patient ID: Kayla Choi, female    DOB: 08-23-1966, 48 y.o.   MRN: [id number]  Patient presents for ER F/U  patient to follow-up ER visit. She was seen in the ER on November 29 she got up in the middle the night to walking the stairs as she slipped her left arm got caught in the reality she continues to fall down the steps. She had x-ray of her wrist and shoulder and her ribs done which were all negative. She continues to have significant pain with range of motion of the shoulder and the wrist. She does have a brace for her left wrist and sling for her shoulder. She's currently on meloxicam as an anti-inflammatory she was given hydrocodone however this makes her itch it also made her a little loopy when she was taking it.    Review Of Systems:  GEN- denies fatigue, fever, weight loss,weakness, recent illness HEENT- denies eye drainage, change in vision, nasal discharge, CVS- denies chest pain, palpitations RESP- denies SOB, cough, wheeze MSK-+ joint pain, muscle aches, injury Neuro- denies headache, dizziness, syncope, seizure activity       Objective:    BP 134/70 mmHg  Pulse 72  Temp(Src) 98.5 F (36.9 C) (Oral)  Resp 16  Ht 5\' 2"  (1.575 m)  Wt 193 lb (87.544 kg)  BMI 35.29 kg/m2 GEN- NAD, alert and oriented x3 Neck- good ROM, spine NT MSK- Normal inspection bilat shoulder UE, Decreased ROM left shoulder, +empty can, biceps in tact,  Pain with flexion and extension of left wrist, no swelling no bruising, normal grasp able to make fist Pulse- Radial 2+       Assessment & Plan:      Problem List Items Addressed This Visit    None    Visit Diagnoses    Wrist strain, left, subsequent encounter    -  Primary    Continue brace for another week, continue NSAIDS, ICE, swithc to ultram for pain see if she tolerate better, recheck 1 week, if not better ortho    Rotator cuff  strain, left, sequela           Note: This dictation was prepared with Dragon dictation along with smaller Company secretary. Any transcriptional errors that result from this process are unintentional.

## 2014-11-17 ENCOUNTER — Encounter: Payer: Self-pay | Admitting: Family Medicine

## 2014-11-17 ENCOUNTER — Ambulatory Visit (INDEPENDENT_AMBULATORY_CARE_PROVIDER_SITE_OTHER): Payer: Medicaid Other | Admitting: Family Medicine

## 2014-11-17 VITALS — BP 140/88 | HR 76 | Temp 98.3°F | Resp 16 | Ht 62.0 in | Wt 193.0 lb

## 2014-11-17 DIAGNOSIS — S66912D Strain of unspecified muscle, fascia and tendon at wrist and hand level, left hand, subsequent encounter: Secondary | ICD-10-CM

## 2014-11-17 DIAGNOSIS — I10 Essential (primary) hypertension: Secondary | ICD-10-CM

## 2014-11-17 DIAGNOSIS — S46019A Strain of muscle(s) and tendon(s) of the rotator cuff of unspecified shoulder, initial encounter: Secondary | ICD-10-CM | POA: Insufficient documentation

## 2014-11-17 DIAGNOSIS — S66912A Strain of unspecified muscle, fascia and tendon at wrist and hand level, left hand, initial encounter: Secondary | ICD-10-CM | POA: Insufficient documentation

## 2014-11-17 DIAGNOSIS — S46012D Strain of muscle(s) and tendon(s) of the rotator cuff of left shoulder, subsequent encounter: Secondary | ICD-10-CM

## 2014-11-17 MED ORDER — CYCLOBENZAPRINE HCL 10 MG PO TABS: 10.0000 mg | ORAL_TABLET | Freq: Three times a day (TID) | ORAL | Status: DC | PRN

## 2014-11-17 NOTE — Progress Notes (Signed)
Patient ID: Kayla Choi, female   DOB: 09-Jul-1966, 48 y.o.   MRN: [id number]   Subjective:    Patient ID: Kayla Choi, female    DOB: [birth year], 48 y.o.   MRN: [id number]  Patient presents for 6 week F/U  patient here for interim follow-up from her fall. She continues to have left shoulder pain or diagnosis rotator cuff strain based on the mechanism of injury where her arm was caught in the railing of the stairs as she fell down she also has a sprain of her wrist which is improving. She's taking her medications as prescribed states she gets a lot of spasm into her neck area in her shoulder. She still wearing her brace secondary to pain.    Review Of Systems:  GEN- denies fatigue, fever, weight loss,weakness, recent illness HEENT- denies eye drainage, change in vision, nasal discharge, CVS- denies chest pain, palpitations RESP- denies SOB, cough, wheeze ABD- denies N/V, change in stools, abd pain GU- denies dysuria, hematuria, dribbling, incontinence MSK- + joint pain, muscle aches, injury Neuro- denies headache, dizziness, syncope, seizure activity       Objective:    BP 140/88 mmHg  Pulse 76  Temp(Src) 98.3 F (36.8 C) (Oral)  Resp 16  Ht 5\' 2"  (1.575 m)  Wt 193 lb (87.544 kg)  BMI 35.29 kg/m2 GEN- NAD, alert and oriented x3 Neck- good ROM, + spasm trapezius, spine NT MSK- Normal inspection bilat shoulder UE, Decreased ROM left shoulder, +empty can, biceps in tact,  Wrist- good ROM  Pain with extension of left wrist - improved, no swelling no bruising, normal grasp able to make fist Pulse- Radial 2+         Assessment & Plan:      Problem List Items Addressed This Visit      Unprioritized   Strain of left wrist - Primary - much improved, wrist brace as needed   Rotator cuff strain -Refer to ortho, no significant improvement with NSAIDS, still using sling, possible worse injury, pain meds given , muscle relaxer      Note: This dictation was prepared with  Dragon dictation along with smaller phrase technology. Any transcriptional errors that result from this process are unintentional.

## 2014-11-17 NOTE — Patient Instructions (Addendum)
Take the norvasc at nightime Take the lisinopril in the morning  Referral to Dr. Aline Brochure for your shoulder Flexeril muscle relaxer F/U 3 months

## 2014-11-17 NOTE — Assessment & Plan Note (Signed)
She does note given that her blood pressures improved today that when she takes all the pills together one time she gets discomfort in her chest and stomach I advised her to take lisinopril in the morning to stay as a diuretic with this and to take the Norvasc at bedtime

## 2014-12-04 ENCOUNTER — Encounter: Payer: Self-pay | Admitting: Orthopedic Surgery

## 2014-12-04 ENCOUNTER — Ambulatory Visit (INDEPENDENT_AMBULATORY_CARE_PROVIDER_SITE_OTHER): Payer: Medicaid Other | Admitting: Orthopedic Surgery

## 2014-12-04 VITALS — BP 139/86 | Ht 62.0 in | Wt 193.0 lb

## 2014-12-04 DIAGNOSIS — M25512 Pain in left shoulder: Secondary | ICD-10-CM

## 2014-12-04 DIAGNOSIS — M542 Cervicalgia: Secondary | ICD-10-CM

## 2014-12-04 MED ORDER — METHOCARBAMOL 500 MG PO TABS: 500.0000 mg | ORAL_TABLET | Freq: Four times a day (QID) | ORAL | Status: DC | PRN

## 2014-12-04 NOTE — Progress Notes (Signed)
Patient ID: Kayla Choi, female   DOB: 08/15/1966, 48 y.o.   MRN: [id number] Patient ID: Kayla Choi, female   DOB: 11-25-1966, 48 y.o.   MRN: [id number]  Chief Complaint  Patient presents with  . Wrist Injury    Left wrist injury, DOI 11-05-14. Fall, referred by Dr. Buelah Manis.  . Shoulder Pain    Left shoulder injury. DOI 11-05-14. Fall    HPI Kayla Choi is a 48 y.o. female.  The patient fell back in November she now complains of neck pain stiffness muscle spasms tingling down into her left hand along with stiffness in the left shoulder with pain including night pain and pain with lying on her left side. She's been on hydrocodone which makes her itch is also on acetaminophen. She took some alternative tramadol as well as Flexeril not helping at this point. 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   HPI  Past Medical History  Diagnosis Date  . Hypertension   . COPD (chronic obstructive pulmonary disease)   . Hyperlipidemia   . Prediabetes   . Anxiety     with social phobia  . Anemia     iron deficinecy  . Depression   . ADD (attention deficit disorder)   . Glaucoma   . Diabetes mellitus   . Social phobia     Past Surgical History  Procedure Laterality Date  . Cholecystectomy    . Right elbow      reconstruction  . Abdominal hysterectomy      fibroids, both ovaries left intact  . Cosmetic surgery      elbow  . Lipoma excision  04/30/2012    Procedure: EXCISION LIPOMA;  Surgeon: Donato Heinz, MD;  Location: [facility];  Service: General;  Laterality: Right;  Excision soft tissue mass right thigh  . Fracture surgery      Recurrent elbow surgery    Family History  Problem Relation Age of Onset  . Hypertension Mother   . Diabetes Father   . Heart  disease Father   . Anesthesia problems Neg Hx   . Malignant hyperthermia Neg Hx   . Hypotension Neg Hx   . Pseudochol deficiency Neg Hx     Social History History  Substance Use Topics  . Smoking status: Never Smoker   . Smokeless tobacco: Never Used  . Alcohol Use: No    Allergies  Allergen Reactions  . Aspirin Shortness Of Breath and Itching  . Metformin And Related Anaphylaxis  . Other Anaphylaxis    Green peas  . Latex     REACTION: Rash and itching  . Neosporin [Neomycin-Bacitracin Zn-Polymyx] Other (See Comments)    Reaction: swelling,rash and skin peeling  . Oxycodone Itching    Current Outpatient Prescriptions  Medication Sig Dispense Refill  . albuterol (PROVENTIL HFA;VENTOLIN HFA) 108 (90 BASE) MCG/ACT inhaler Inhale 2 puffs into the lungs every 6 (six) hours as needed for wheezing or shortness of breath. 1 Inhaler 6  . albuterol (PROVENTIL) (2.5 MG/3ML) 0.083% nebulizer solution Take 3 mLs (2.5 mg total) by nebulization every 4 (four) hours as needed for wheezing or shortness of breath. 150 mL 1  . amLODipine (NORVASC) 5 MG tablet Take 1 tablet (5 mg total) by mouth daily. 30 tablet  3  . atorvastatin (LIPITOR) 80 MG tablet Take 1 tablet (80 mg total) by mouth daily. 30 tablet 3  . budesonide-formoterol (SYMBICORT) 160-4.5 MCG/ACT inhaler Inhale 2 puffs into the lungs 2 (two) times daily. 1 Inhaler 6  . buPROPion (WELLBUTRIN XL) 300 MG 24 hr tablet Take 1 tablet (300 mg total) by mouth daily. 30 tablet 6  . cyclobenzaprine (FLEXERIL) 10 MG tablet Take 1 tablet (10 mg total) by mouth 3 (three) times daily as needed for muscle spasms. 30 tablet 0  . fluticasone (FLONASE) 50 MCG/ACT nasal spray Place 2 sprays into the nose daily.      Marland Kitchen lisinopril-hydrochlorothiazide (ZESTORETIC) 20-12.5 MG per tablet Take 2 tablets by mouth daily. 60 tablet 6  . meloxicam (MOBIC) 7.5 MG tablet Take 1 tablet (7.5 mg total) by mouth daily. 30 tablet 2  . methocarbamol (ROBAXIN) 500 MG  tablet Take 1 tablet (500 mg total) by mouth every 6 (six) hours as needed for muscle spasms. 90 tablet 1  . sertraline (ZOLOFT) 100 MG tablet Take 1 tablet (100 mg total) by mouth daily. 30 tablet 6  . sitaGLIPtin (JANUVIA) 100 MG tablet Take 1 tablet (100 mg total) by mouth daily. 30 tablet 3  . tiotropium (SPIRIVA) 18 MCG inhalation capsule Place 1 capsule (18 mcg total) into inhaler and inhale daily. 30 capsule 6  . topiramate (TOPAMAX) 25 MG tablet Take 25 mg by mouth at bedtime.    . traMADol (ULTRAM) 50 MG tablet Take 1 tablet (50 mg total) by mouth every 8 (eight) hours as needed. 45 tablet 1   No current facility-administered medications for this visit.    Review of Systems Review of Systems numbness tingling dizziness lightheadedness I think she had some of this when she fell she has stiffness of her left shoulder joint depression anxiety shortness of breath cough breathing issues wheezing chest pain ankle leg swelling. Remaining review of systems was negative    Blood pressure 139/86, height 5\' 2"  (1.575 m), weight 193 lb (87.544 kg).  Physical Exam Physical Exam  Constitutional: She is oriented to person, place, and time. She appears well-developed and well-nourished. No distress.  HENT:  Head: Normocephalic.  Eyes: Pupils are equal, round, and reactive to light.  Neck: No tracheal deviation present. No thyromegaly present.  Cardiovascular: Intact distal pulses.   Lymphadenopathy:    She has no cervical adenopathy.  Neurological: She is alert and oriented to person, place, and time. She has normal reflexes. She displays normal reflexes. She exhibits normal muscle tone.  Skin: Skin is warm and dry. No rash noted. No erythema. No pallor.  Psychiatric: She has a normal mood and affect. Her behavior is normal. Judgment and thought content normal.  Nursing note and vitals reviewed. Back Exam   Tenderness  The patient is experiencing tenderness in the cervical.  Range of  Motion  Extension: abnormal  Flexion: abnormal  Lateral Bend Right: abnormal  Lateral Bend Left: abnormal  Rotation Right: abnormal  Rotation Left: abnormal   Other  Sensation: normal Erythema: no back redness Scars: absent  Comments:  Increased muscle tension noted in the paraspinous muscular with palpable tenderness in the midline of the cervical spine   Right Shoulder Exam  Right shoulder exam is normal.   Left Shoulder Exam   Tenderness  The patient is experiencing tenderness in the acromion.  Range of Motion  Active Abduction: abnormal  Passive Abduction: normal  Extension: normal  Forward Flexion: abnormal  External Rotation: normal  Internal Rotation 0 degrees: normal  Internal Rotation 90 degrees: abnormal   Muscle Strength  The patient has normal left shoulder strength.  Tests  Apprehension: negative Cross Arm: negative Drop Arm: negative Hawkin's test: positive Impingement: positive Sulcus: absent  Other  Erythema: absent Scars: absent Sensation: normal Pulse: present       Data Reviewed X-rays show os acromiale with normal wrist and shoulder in terms of any fracture  Assessment    Cervicalgia with shoulder pain possible impingement    Plan    Recommend physical therapy Change Flexeril to Robaxin due to drowsiness Continue tramadol secondary to hydrocodone causing itching Return 2 months       Arther Abbott 12/04/2014, 10:04 AM

## 2014-12-04 NOTE — Patient Instructions (Signed)
Therapy at [facility]

## 2014-12-07 ENCOUNTER — Ambulatory Visit (HOSPITAL_COMMUNITY)
Admission: RE | Admit: 2014-12-07 | Discharge: 2014-12-07 | Disposition: A | Discharge status: Home / Self Care | Payer: Medicaid Other | Source: Ambulatory Visit | Attending: Family Medicine | Admitting: Family Medicine

## 2014-12-07 DIAGNOSIS — M25532 Pain in left wrist: Secondary | ICD-10-CM | POA: Diagnosis not present

## 2014-12-07 DIAGNOSIS — M25512 Pain in left shoulder: Secondary | ICD-10-CM | POA: Diagnosis not present

## 2014-12-07 DIAGNOSIS — M542 Cervicalgia: Secondary | ICD-10-CM | POA: Insufficient documentation

## 2014-12-07 DIAGNOSIS — Z5189 Encounter for other specified aftercare: Secondary | ICD-10-CM | POA: Diagnosis present

## 2014-12-07 NOTE — Therapy (Signed)
[place], [place], [address] Phone: [phone]   Fax:  [phone]  Occupational Therapy Evaluation  Patient Details  Name: Kayla Choi MRN: [id number] Date of Birth: 26-Jun-1966  Encounter Date: 12/07/2014      OT End of Session - 12/07/14 1438    Visit Number 1   Number of Visits 1   Authorization Type Medicaid - only authorizes one visit.  Asked if patient wished to continue as self pay, and she declined   Authorization - Visit Number 1   Authorization - Number of Visits 1   OT Start Time 1105   OT Stop Time 1145   OT Time Calculation (min) 40 min   Activity Tolerance Patient tolerated treatment well   Behavior During Therapy WFL for tasks assessed/performed      Past Medical History  Diagnosis Date  . Hypertension   . COPD (chronic obstructive pulmonary disease)   . Hyperlipidemia   . Prediabetes   . Anxiety     with social phobia  . Anemia     iron deficinecy  . Depression   . ADD (attention deficit disorder)   . Glaucoma   . Diabetes mellitus   . Social phobia     Past Surgical History  Procedure Laterality Date  . Cholecystectomy    . Right elbow      reconstruction  . Abdominal hysterectomy      fibroids, both ovaries left intact  . Cosmetic surgery      elbow  . Lipoma excision  04/30/2012    Procedure: EXCISION LIPOMA;  Surgeon: Donato Heinz, MD;  Location: [facility];  Service: General;  Laterality: Right;  Excision soft tissue mass right thigh  . Fracture surgery      Recurrent elbow surgery    There were no vitals taken for this visit.  Visit Diagnosis:  Pain in joint, shoulder region, left  Cervicalgia      Subjective Assessment - 12/07/14 1431    Symptoms S:  I fell down the steps in November, and my left shoulder, neck, and wrist have hurt ever since.   Pertinent History Kayla Choi reports falling at home on 11/04/14.  She caught herself with her left arm and felt  pain in her shoulder and neck and wrist.  She went to Dr. Aline Brochure who has referred her to occupational therapy for evaluation and treatment.    Special Tests n/a   Patient Stated Goals I want to get my left arm back where it is supposed to be.   Currently in Pain? Yes   Pain Score 10-Worst pain ever   Pain Location Shoulder   Pain Orientation Left   Pain Descriptors / Indicators Aching   Pain Onset More than a month ago          [facility] OT Assessment - 12/07/14 0001    Assessment   Diagnosis Left Shoulder, Wrist, and Neck Pain   Onset Date 11/04/14   Assessment LUE   Prior Therapy N/A   Precautions   Precautions None   Balance Screen   Has the patient fallen in the past 6 months Yes   How many times? 1   Has the patient had a decrease in activity level because of a fear of falling?  No   Is the patient reluctant to leave their home because of a fear of falling?  No   Home  Environment   Lives With [facility]  Prior Function   Level of Independence Independent with basic ADLs;Independent with homemaking with ambulation   Vocation Unemployed   Leisure taking care of her godchildren   ADL   ADL comments using her left arm to lift, cook, clean, don her shirt, comb her hair is painful.   Written Expression   Dominant Hand Right   Vision - History   Baseline Vision No visual deficits   Activity Tolerance   Activity Tolerance Endurance does not limit participation in activity   Observation/Other Assessments   Outcome Measures n/a   Palpation   Palpation moderate fascial restrictions in left shoudler region   AROM   Overall AROM Comments assessed in seated    Left Shoulder Flexion 140 Degrees   Left Shoulder ABduction 95 Degrees   Left Shoulder Internal Rotation 90 Degrees   Left Shoulder External Rotation 80 Degrees   Cervical Flexion WFL   Cervical Extension WFL   Cervical - Right Side Bend WFL   Cervical - Left Side Bend WFL   Cervical - Right Rotation WFL   Cervical -  Left Rotation [facility]   Strength   Overall Strength Comments strength not assessed due to high pain level                OT Treatments/Exercises (OP) - 12/07/14 0001    Manual Therapy   Manual Therapy Myofascial release   Myofascial Release left shoulder and cervical region               OT Education - 12/07/14 1438    Education provided Yes   Education Details towel slides and cervical stretches   Person(s) Educated Patient   Methods Explanation;Demonstration   Comprehension Verbalized understanding;Returned demonstration          OT Short Term Goals - 12/07/14 1441    OT SHORT TERM GOAL #1   Title Patient educated on HEP.   Time 1   Period Weeks   Status Achieved                  Plan - 12/07/14 1439    Clinical Impression Statement A:  Patient presents with increased pain and fascial restrictions in her left shoulder and neck.  Patient has significant limitations in her AROM due to pain and restrictions.  Patient has mod fascial restrictions.     Rehab Potential Good   OT Frequency 1x / week   OT Duration --  1 week   OT Treatment/Interventions Patient/family education   Plan P:  Due to lack of insurance and not wanting to continue as self pay, Patient transitioned to HEP for stetches for shoulder and neck.    Consulted and Agree with Plan of Care Patient        Problem List Patient Active Problem List   Diagnosis Date Noted  . Strain of left wrist 11/17/2014  . Rotator cuff strain 11/17/2014  . Knee pain 10/06/2014  . Neck muscle spasm 07/02/2014  . Fall 07/02/2014  . Back pain 10/23/2013  . Abdominal pain, unspecified site 07/20/2013  . OM (otitis media) 05/19/2013  . Encounter for routine gynecological examination 04/17/2013  . Lesion of mouth 01/09/2013  . Headache 10/05/2012  . Inadequate social support 06/25/2012  . Microalbuminuria 06/25/2012  . Diabetes mellitus, type II 04/02/2012  . Keloid scar of skin 09/03/2011  .  COPD (chronic obstructive pulmonary disease) 05/21/2007  . Hyperlipidemia 01/29/2007  . Anxiety state 11/02/2006  . DEPRESSION 11/02/2006  . Essential  hypertension 11/02/2006    Vangie Bicker, OTR/L [phone]  12/07/2014, 2:45 PM  [place] [address] [place], [place], [address] Phone: [phone]   Fax:  [phone]

## 2014-12-07 NOTE — Patient Instructions (Addendum)
SHOULDER: Flexion On Table   Place hands on table, elbows straight. Move hips away from body. Press hands down into table. Hold ___ seconds. ___ reps per set, ___ sets per day, ___ days per week  Abduction (Passive)   With arm out to side, resting on table, lower head toward arm, keeping trunk away from table. Hold ____ seconds. Repeat ____ times. Do ____ sessions per day.  Copyright  VHI. All rights reserved.     Internal Rotation (Assistive)   Seated with elbow bent at right angle and held against side, slide arm on table surface in an inward arc. Repeat ____ times. Do ____ sessions per day. Activity: Use this motion to brush crumbs off the table.  Copyright  VHI. All rights reserved.   Scalene Stretch, Sitting  Repeat __10_ times per session. Do _1-2__ sessions per day.  Copyright  VHI. All rights reserved.  Scalene Stretch, Sitting   Sit, one hand tucked under hip on side to be stretched, other hand over top of head. Gently pull head to side and backwards. Hold ___ seconds. For more stretch, lean body in direction of head pull. Repeat ___ times per session. Do ___ sessions per day.  Copyright  VHI. All rights reserved.  Side Bend, Standing   Stand, one hand grasping other arm above wrist behind body. Pull down while gently tilting head toward pulling side. Hold ___ seconds. Repeat ___ times per session. Do ___ sessions per day.  Copyright  VHI. All rights reserved.  Levator Scapula Stretch, Sitting   Sit, one hand on same-side shoulder blade, other hand on head. Gently pull head down and away. Hold ___ seconds. Repeat ___ times per session. Do ___ sessions per day.  Copyright  VHI. All rights reserved.  Lower Cervical / Upper Thoracic Stretch   Clasp hands together in front with arms extended. Gently pull shoulder blades apart and bend head forward. Hold ____ seconds. Repeat ____ times per set. Do ____ sets per session. Do ____ sessions per  day.  http://orth.exer.us/354   Copyright  VHI. All rights reserved.  Axial Extension (Chin Tuck)   Pull chin in and lengthen back of neck. Hold ____ seconds while counting out loud. Repeat ____ times. Do ____ sessions per day.  http://gt2.exer.us/449   Copyright  VHI. All rights reserved.

## 2015-01-17 ENCOUNTER — Other Ambulatory Visit: Payer: Self-pay | Admitting: Family Medicine

## 2015-01-17 NOTE — Telephone Encounter (Signed)
Medication refilled per protocol. 

## 2015-01-31 ENCOUNTER — Other Ambulatory Visit: Payer: Self-pay | Admitting: Family Medicine

## 2015-01-31 NOTE — Telephone Encounter (Signed)
Okay to refill? 

## 2015-01-31 NOTE — Telephone Encounter (Signed)
Medication called to pharmacy. 

## 2015-01-31 NOTE — Telephone Encounter (Signed)
Ok to refill Tramadol??  Last office visit 11/17/2014.  Last refill 11/10/2014, #1 refill.

## 2015-02-01 ENCOUNTER — Telehealth: Payer: Self-pay | Admitting: *Deleted

## 2015-02-01 NOTE — Telephone Encounter (Signed)
Received request from pharmacy for PA on [place].   Metformin has been tried and failed.   PA submitted.   Dx: E11.9

## 2015-02-06 ENCOUNTER — Ambulatory Visit (INDEPENDENT_AMBULATORY_CARE_PROVIDER_SITE_OTHER): Payer: Medicaid Other | Admitting: Orthopedic Surgery

## 2015-02-06 VITALS — BP 121/76 | Ht 62.0 in | Wt 193.0 lb

## 2015-02-06 DIAGNOSIS — M25512 Pain in left shoulder: Secondary | ICD-10-CM

## 2015-02-06 DIAGNOSIS — M75102 Unspecified rotator cuff tear or rupture of left shoulder, not specified as traumatic: Secondary | ICD-10-CM

## 2015-02-06 MED ORDER — METHOCARBAMOL 500 MG PO TABS
500.0000 mg | ORAL_TABLET | Freq: Four times a day (QID) | ORAL | Status: DC | PRN
Start: 2015-02-06 — End: 2015-03-26

## 2015-02-06 NOTE — Progress Notes (Signed)
Chief Complaint  Patient presents with  . Follow-up    2 month recheck neck/Left shoulder pain s/p therapy   Patient comes back after physical therapy for 3 visits as her Medicaid won't pay for more. She was evaluated for cervicalgia with possible impingement she is doing her exercises she complains of pain when she sleeps on her left side decreased range of motion when putting her close on and continued pain with forward elevation  Review of systems no numbness or tingling no real weakness in terms of lifting things with her left upper extremity  BP 121/76 mmHg  Ht 5\' 2"  (1.575 m)  Wt 193 lb (87.544 kg)  BMI 35.29 kg/m2 She is awake alert and oriented her appearance is normal she has 120 of active forward elevation 150 of passive forward elevation and painful range of motion throughout the arc shoulder is stable she lacks internal rotation past her hip pocket while on the right side she has full internal rotation positive Neer sign for impingement neurovascular exam intact. Strength exam normal  Encounter Diagnoses  Name Primary?  . Left shoulder pain Yes  . Rotator cuff syndrome of left shoulder     Procedure note the subacromial injection shoulder left   Verbal consent was obtained to inject the  Left   Shoulder  Timeout was completed to confirm the injection site is a subacromial space of the  left  shoulder  Medication used Depo-Medrol 40 mg and lidocaine 1% 3 cc  Anesthesia was provided by ethyl chloride  The injection was performed in the left  posterior subacromial space. After pinning the skin with alcohol and anesthetized the skin with ethyl chloride the subacromial space was injected using a 20-gauge needle. There were no complications  Sterile dressing was applied.  Continue his shoulder exercises at home. Continue Robaxin which was refilled continue tramadol  Follow-up as needed

## 2015-02-06 NOTE — Patient Instructions (Signed)
Continue home exercises  Joint Injection Care After Refer to this sheet in the next few days. These instructions provide you with information on caring for yourself after you have had a joint injection. Your caregiver also may give you more specific instructions. Your treatment has been planned according to current medical practices, but problems sometimes occur. Call your caregiver if you have any problems or questions after your procedure. After any type of joint injection, it is not uncommon to experience:  Soreness, swelling, or bruising around the injection site.  Mild numbness, tingling, or weakness around the injection site caused by the numbing medicine used before or with the injection. It also is possible to experience the following effects associated with the specific agent after injection:  Iodine-based contrast agents:  Allergic reaction (itching, hives, widespread redness, and swelling beyond the injection site).  Corticosteroids (These effects are rare.):  Allergic reaction.  Increased blood sugar levels (If you have diabetes and you notice that your blood sugar levels have increased, notify your caregiver).  Increased blood pressure levels.  Mood swings.  Hyaluronic acid in the use of viscosupplementation.  Temporary heat or redness.  Temporary rash and itching.  Increased fluid accumulation in the injected joint. These effects all should resolve within a day after your procedure.  HOME CARE INSTRUCTIONS  Limit yourself to light activity the day of your procedure. Avoid lifting heavy objects, bending, stooping, or twisting.  Take prescription or over-the-counter pain medication as directed by your caregiver.  You may apply ice to your injection site to reduce pain and swelling the day of your procedure. Ice may be applied 03-04 times:  Put ice in a plastic bag.  Place a towel between your skin and the bag.  Leave the ice on for no longer than 15-20  minutes each time. SEEK IMMEDIATE MEDICAL CARE IF:   Pain and swelling get worse rather than better or extend beyond the injection site.  Numbness does not go away.  Blood or fluid continues to leak from the injection site.  You have chest pain.  You have swelling of your face or tongue.  You have trouble breathing or you become dizzy.  You develop a fever, chills, or severe tenderness at the injection site that last longer than 1 day. MAKE SURE YOU:  Understand these instructions.  Watch your condition.  Get help right away if you are not doing well or if you get worse. Document Released: 08/07/2011 Document Revised: 02/16/2012 Document Reviewed: 08/07/2011 [facility] Patient Information 2015 [place], [place]. This information is not intended to replace advice given to you by your health care provider. Make sure you discuss any questions you have with your health care provider.

## 2015-02-08 NOTE — Telephone Encounter (Signed)
Call placed to [facility] to inquire about PA.   PA approved.   02/01/2015-01/27/2016.  [phone]

## 2015-02-23 ENCOUNTER — Encounter: Payer: Self-pay | Admitting: Family Medicine

## 2015-02-23 ENCOUNTER — Ambulatory Visit: Payer: Medicaid Other | Admitting: Family Medicine

## 2015-02-28 ENCOUNTER — Encounter: Payer: Self-pay | Admitting: Family Medicine

## 2015-02-28 ENCOUNTER — Ambulatory Visit (INDEPENDENT_AMBULATORY_CARE_PROVIDER_SITE_OTHER): Payer: Medicaid Other | Admitting: Family Medicine

## 2015-02-28 VITALS — BP 136/72 | HR 68 | Temp 98.1°F | Resp 18 | Ht 62.0 in | Wt 186.0 lb

## 2015-02-28 DIAGNOSIS — I10 Essential (primary) hypertension: Secondary | ICD-10-CM

## 2015-02-28 DIAGNOSIS — B351 Tinea unguium: Secondary | ICD-10-CM | POA: Diagnosis not present

## 2015-02-28 DIAGNOSIS — F411 Generalized anxiety disorder: Secondary | ICD-10-CM | POA: Diagnosis not present

## 2015-02-28 DIAGNOSIS — R079 Chest pain, unspecified: Secondary | ICD-10-CM

## 2015-02-28 DIAGNOSIS — E785 Hyperlipidemia, unspecified: Secondary | ICD-10-CM | POA: Diagnosis not present

## 2015-02-28 DIAGNOSIS — E119 Type 2 diabetes mellitus without complications: Secondary | ICD-10-CM

## 2015-02-28 LAB — CBC WITH DIFFERENTIAL/PLATELET
BASOS ABS: 0.1 10*3/uL (ref 0.0–0.1)
Basophils Relative: 1 % (ref 0–1)
EOS ABS: 0.1 10*3/uL (ref 0.0–0.7)
EOS PCT: 1 % (ref 0–5)
HCT: 41 % (ref 36.0–46.0)
HEMOGLOBIN: 13.5 g/dL (ref 12.0–15.0)
LYMPHS ABS: 1.7 10*3/uL (ref 0.7–4.0)
Lymphocytes Relative: 33 % (ref 12–46)
MCH: 31.1 pg (ref 26.0–34.0)
MCHC: 32.9 g/dL (ref 30.0–36.0)
MCV: 94.5 fL (ref 78.0–100.0)
MPV: 10 fL (ref 8.6–12.4)
Monocytes Absolute: 0.4 10*3/uL (ref 0.1–1.0)
Monocytes Relative: 7 % (ref 3–12)
Neutro Abs: 3 10*3/uL (ref 1.7–7.7)
Neutrophils Relative %: 58 % (ref 43–77)
PLATELETS: 417 10*3/uL — AB (ref 150–400)
RBC: 4.34 MIL/uL (ref 3.87–5.11)
RDW: 12.7 % (ref 11.5–15.5)
WBC: 5.2 10*3/uL (ref 4.0–10.5)

## 2015-02-28 LAB — LIPID PANEL
CHOL/HDL RATIO: 4 ratio
Cholesterol: 210 mg/dL — ABNORMAL HIGH (ref 0–200)
HDL: 53 mg/dL (ref >=46)
LDL Cholesterol: 141 mg/dL — ABNORMAL HIGH (ref 0–99)
Triglycerides: 78 mg/dL (ref <150)
VLDL: 16 mg/dL (ref 0–40)

## 2015-02-28 LAB — HEMOGLOBIN A1C
HEMOGLOBIN A1C: 6.9 % — AB (ref <5.7)
MEAN PLASMA GLUCOSE: 151 mg/dL — AB (ref <117)

## 2015-02-28 LAB — COMPREHENSIVE METABOLIC PANEL
ALBUMIN: 4 g/dL (ref 3.5–5.2)
ALT: 17 U/L (ref 0–35)
AST: 13 U/L (ref 0–37)
Alkaline Phosphatase: 36 U/L — ABNORMAL LOW (ref 39–117)
BILIRUBIN TOTAL: 0.8 mg/dL (ref 0.2–1.2)
BUN: 10 mg/dL (ref 6–23)
CALCIUM: 9.3 mg/dL (ref 8.4–10.5)
CO2: 22 mEq/L (ref 19–32)
CREATININE: 0.8 mg/dL (ref 0.50–1.10)
Chloride: 103 mEq/L (ref 96–112)
Glucose, Bld: 154 mg/dL — ABNORMAL HIGH (ref 70–99)
Potassium: 4.2 mEq/L (ref 3.5–5.3)
Sodium: 136 mEq/L (ref 135–145)
Total Protein: 7.1 g/dL (ref 6.0–8.3)

## 2015-02-28 MED ORDER — CLONAZEPAM 0.5 MG PO TABS: 0.5000 mg | ORAL_TABLET | Freq: Two times a day (BID) | ORAL | Status: DC | PRN

## 2015-02-28 NOTE — Assessment & Plan Note (Signed)
Her blood sugar has been well controlled we'll recheck her fasting labs today

## 2015-02-28 NOTE — Progress Notes (Signed)
Patient ID: Kayla Choi, female   DOB: 11-02-1966, 49 y.o.   MRN: [id number]   Subjective:    Patient ID: Kayla Choi, female    DOB: 1966-12-07, 49 y.o.   MRN: [id number]  Patient presents for 3 month F/u; Pain; and Toenail Discoloration  DM-  Taking jauvia as prescribed, did not bring meter highest 222 after eating, fasting sugars <120, has lost7 lbs since our last visit 3 months ago, trying to be more active  Recurrent episodes with chest pain has been to ER multiple times with negative work up, last night she got upset and had an episode of chest pain that typically occur when she is upset and anxious or when she is at the house alone. She is followed by psychiatry for her depression and anxiety there been no medication changes.  She has been released for the most part from orthopedics for her shoulder injury they did advise her she continues to have pain she can follow-up she still has some pain when she is doing some housework but in general has improved  She does complain of toenail pain states her nails are very thick and she is difficulty cutting them and she is now wearing flip-flops because when she puts them in shoes she feels a lot of pressure around him. The nails also changing colors  Review Of Systems:  GEN- denies fatigue, fever, weight loss,weakness, recent illness HEENT- denies eye drainage, change in vision, nasal discharge, CVS- + chest pain, palpitations RESP- denies SOB, cough, wheeze ABD- denies N/V, change in stools, abd pain GU- denies dysuria, hematuria, dribbling, incontinence MSK-+ joint pain, muscle aches, injury Neuro- denies headache, dizziness, syncope, seizure activity       Objective:    BP 136/72 mmHg  Pulse 68  Temp(Src) 98.1 F (36.7 C) (Oral)  Resp 18  Ht 5\' 2"  (1.575 m)  Wt 186 lb (84.369 kg)  BMI 34.01 kg/m2 GEN- NAD, alert and oriented x3 HEENT- PERRL, EOMI, non injected sclera, pink conjunctiva, MMM, oropharynx clear CVS- RRR, no  murmur RESP-CTAB ABD-NABS,soft,NT,ND Psych- normal affect and mood EXT- No edema Pulses- Radial, DP- 2+  EKG- NSR, no ST changes      Assessment & Plan:      Problem List Items Addressed This Visit      Unprioritized   Hyperlipidemia   Relevant Orders   Lipid panel   Essential hypertension   Diabetes mellitus, type II - Primary   Relevant Orders   CBC with Differential/Platelet   Comprehensive metabolic panel   Hemoglobin A1c   HM DIABETES FOOT EXAM (Completed)   Anxiety state      Note: This dictation was prepared with Dragon dictation along with smaller phrase technology. Any transcriptional errors that result from this process are unintentional.

## 2015-02-28 NOTE — Assessment & Plan Note (Signed)
Blood pressure well controlled no change in medication 

## 2015-02-28 NOTE — Assessment & Plan Note (Signed)
I think her chest pain is mostly due to her anxiety she's had a lot of negative workup in the ER her EKG is unchanged here today in the office as well. I'm going try her on Klonopin she will try this when she has any of her symptoms come up. She still has chest pain through this then I will refer her to a cardiologist

## 2015-02-28 NOTE — Patient Instructions (Addendum)
Referral to foot doctor Try the klonopin for your nerves Continue all other medications Call if chest pain continues F/U 4 months

## 2015-02-28 NOTE — Assessment & Plan Note (Signed)
Continue current dose of Lipitor ?

## 2015-03-02 ENCOUNTER — Other Ambulatory Visit: Payer: Self-pay | Admitting: *Deleted

## 2015-03-02 MED ORDER — ROSUVASTATIN CALCIUM 20 MG PO TABS: 20.0000 mg | ORAL_TABLET | Freq: Every day | ORAL | Status: DC

## 2015-03-26 ENCOUNTER — Encounter: Payer: Self-pay | Admitting: Podiatry

## 2015-03-26 ENCOUNTER — Ambulatory Visit (INDEPENDENT_AMBULATORY_CARE_PROVIDER_SITE_OTHER): Payer: Medicaid Other | Admitting: Podiatry

## 2015-03-26 VITALS — BP 138/85 | HR 95 | Resp 14

## 2015-03-26 DIAGNOSIS — B351 Tinea unguium: Secondary | ICD-10-CM

## 2015-03-26 NOTE — Progress Notes (Signed)
   Subjective:    Patient ID: Kayla Choi, female    DOB: 07/21/1966, 49 y.o.   MRN: [id number]  HPI  N- toe nail trim, discoloration of nails L- 3 toes on left foot, l toe on right foot D- 2 months O-woke up one morning and they were discolored A-left great toe is sore to touch and did have a dent in it T- just cover with nail polish  "Nail trim and check why toes are changing colors" Patient denies any history of foot ulceration or claudication Patient states she's been disabled for 4 years  Review of Systems  Respiratory: Positive for cough, shortness of breath and wheezing.   Cardiovascular: Positive for chest pain and leg swelling.  Skin: Positive for color change.       Thick scars  Allergic/Immunologic: Positive for environmental allergies and food allergies.  Neurological: Positive for headaches.  Hematological:       Slow to heal  Psychiatric/Behavioral: The patient is nervous/anxious.        Objective:   Physical Exam  Orientated 3  Vascular: DP pulses 2/4 bilaterally PT pulses 2/4 bilaterally Capillary reflex immediate bilaterally  Neurological: Sensation to 10 g monofilament wire intact 5/5 bilaterally Ankle reflex equal and reactive bilaterally Vibratory sensation intact bilaterally  Dermatological: Texture and turgor within normal limits The distal hallux nails of texture and color changes distally The remaining toenail plates 2-5 are normal trophic without texture and color changes  Musculoskeletal: No deformities noted There is no restriction ankle, subtalar, midtarsal joints, bilaterally      Assessment & Plan:   Assessment: Satisfactory neurovascular status Distal onychomycoses hallux nails  Plan: Discuss treatment options for possible onychomycoses including no treatment, topical, oral. Patient has interest in having active treatment for the suspect mycotic toenails  The right and left hallux nails are debrided and the nail  fragments are submitted for PAS stain and fungal culture Notify patient upon receipt of lab

## 2015-03-26 NOTE — Patient Instructions (Signed)
Our office will contact you after received the results of the lab to see if you have a fungal toenail infection This lab this could take at least 30 days to get the results  Diabetes and Foot Care Diabetes may cause you to have problems because of poor blood supply (circulation) to your feet and legs. This may cause the skin on your feet to become thinner, break easier, and heal more slowly. Your skin may become dry, and the skin may peel and crack. You may also have nerve damage in your legs and feet causing decreased feeling in them. You may not notice minor injuries to your feet that could lead to infections or more serious problems. Taking care of your feet is one of the most important things you can do for yourself.  HOME CARE INSTRUCTIONS  Wear shoes at all times, even in the house. Do not go barefoot. Bare feet are easily injured.  Check your feet daily for blisters, cuts, and redness. If you cannot see the bottom of your feet, use a mirror or ask someone for help.  Wash your feet with warm water (do not use hot water) and mild soap. Then pat your feet and the areas between your toes until they are completely dry. Do not soak your feet as this can dry your skin.  Apply a moisturizing lotion or petroleum jelly (that does not contain alcohol and is unscented) to the skin on your feet and to dry, brittle toenails. Do not apply lotion between your toes.  Trim your toenails straight across. Do not dig under them or around the cuticle. File the edges of your nails with an emery board or nail file.  Do not cut corns or calluses or try to remove them with medicine.  Wear clean socks or stockings every day. Make sure they are not too tight. Do not wear knee-high stockings since they may decrease blood flow to your legs.  Wear shoes that fit properly and have enough cushioning. To break in new shoes, wear them for just a few hours a day. This prevents you from injuring your feet. Always look in  your shoes before you put them on to be sure there are no objects inside.  Do not cross your legs. This may decrease the blood flow to your feet.  If you find a minor scrape, cut, or break in the skin on your feet, keep it and the skin around it clean and dry. These areas may be cleansed with mild soap and water. Do not cleanse the area with peroxide, alcohol, or iodine.  When you remove an adhesive bandage, be sure not to damage the skin around it.  If you have a wound, look at it several times a day to make sure it is healing.  Do not use heating pads or hot water bottles. They may burn your skin. If you have lost feeling in your feet or legs, you may not know it is happening until it is too late.  Make sure your health care provider performs a complete foot exam at least annually or more often if you have foot problems. Report any cuts, sores, or bruises to your health care provider immediately. SEEK MEDICAL CARE IF:   You have an injury that is not healing.  You have cuts or breaks in the skin.  You have an ingrown nail.  You notice redness on your legs or feet.  You feel burning or tingling in your legs or feet.  You have pain or cramps in your legs and feet.  Your legs or feet are numb.  Your feet always feel cold. SEEK IMMEDIATE MEDICAL CARE IF:   There is increasing redness, swelling, or pain in or around a wound.  There is a red line that goes up your leg.  Pus is coming from a wound.  You develop a fever or as directed by your health care provider.  You notice a bad smell coming from an ulcer or wound. Document Released: 11/21/2000 Document Revised: 07/27/2013 Document Reviewed: 05/03/2013 [facility] Patient Information 2015 [place], [place]. This information is not intended to replace advice given to you by your health care provider. Make sure you discuss any questions you have with your health care provider.

## 2015-04-27 ENCOUNTER — Other Ambulatory Visit: Payer: Self-pay | Admitting: Family Medicine

## 2015-04-27 NOTE — Telephone Encounter (Signed)
Clonazepam LRF 02/28/15 #20 +1  Tramadol LRF 01/31/15 #45+1  LOV 02/28/15  OK refill?

## 2015-04-27 NOTE — Telephone Encounter (Signed)
Okay to refill? 

## 2015-04-27 NOTE — Telephone Encounter (Signed)
rx's called in . 

## 2015-05-04 ENCOUNTER — Emergency Department (HOSPITAL_COMMUNITY): Payer: Medicaid Other

## 2015-05-04 ENCOUNTER — Encounter (HOSPITAL_COMMUNITY): Payer: Self-pay

## 2015-05-04 ENCOUNTER — Emergency Department (HOSPITAL_COMMUNITY)
Admission: EM | Admit: 2015-05-04 | Discharge: 2015-05-04 | Disposition: A | Discharge status: Home / Self Care | Payer: Medicaid Other | Attending: Emergency Medicine | Admitting: Emergency Medicine

## 2015-05-04 DIAGNOSIS — W108XXA Fall (on) (from) other stairs and steps, initial encounter: Secondary | ICD-10-CM | POA: Diagnosis not present

## 2015-05-04 DIAGNOSIS — E119 Type 2 diabetes mellitus without complications: Secondary | ICD-10-CM | POA: Insufficient documentation

## 2015-05-04 DIAGNOSIS — S59902A Unspecified injury of left elbow, initial encounter: Secondary | ICD-10-CM | POA: Diagnosis present

## 2015-05-04 DIAGNOSIS — Z7951 Long term (current) use of inhaled steroids: Secondary | ICD-10-CM | POA: Diagnosis not present

## 2015-05-04 DIAGNOSIS — Y9389 Activity, other specified: Secondary | ICD-10-CM | POA: Insufficient documentation

## 2015-05-04 DIAGNOSIS — J449 Chronic obstructive pulmonary disease, unspecified: Secondary | ICD-10-CM | POA: Diagnosis not present

## 2015-05-04 DIAGNOSIS — Z79899 Other long term (current) drug therapy: Secondary | ICD-10-CM | POA: Insufficient documentation

## 2015-05-04 DIAGNOSIS — Z791 Long term (current) use of non-steroidal anti-inflammatories (NSAID): Secondary | ICD-10-CM | POA: Insufficient documentation

## 2015-05-04 DIAGNOSIS — F329 Major depressive disorder, single episode, unspecified: Secondary | ICD-10-CM | POA: Diagnosis not present

## 2015-05-04 DIAGNOSIS — Z8669 Personal history of other diseases of the nervous system and sense organs: Secondary | ICD-10-CM | POA: Insufficient documentation

## 2015-05-04 DIAGNOSIS — Y998 Other external cause status: Secondary | ICD-10-CM | POA: Insufficient documentation

## 2015-05-04 DIAGNOSIS — Y92038 Other place in apartment as the place of occurrence of the external cause: Secondary | ICD-10-CM | POA: Insufficient documentation

## 2015-05-04 DIAGNOSIS — E785 Hyperlipidemia, unspecified: Secondary | ICD-10-CM | POA: Diagnosis not present

## 2015-05-04 DIAGNOSIS — S5002XA Contusion of left elbow, initial encounter: Secondary | ICD-10-CM | POA: Insufficient documentation

## 2015-05-04 DIAGNOSIS — F419 Anxiety disorder, unspecified: Secondary | ICD-10-CM | POA: Diagnosis not present

## 2015-05-04 DIAGNOSIS — I10 Essential (primary) hypertension: Secondary | ICD-10-CM | POA: Diagnosis not present

## 2015-05-04 DIAGNOSIS — Z91040 Latex allergy status: Secondary | ICD-10-CM | POA: Diagnosis not present

## 2015-05-04 DIAGNOSIS — Z862 Personal history of diseases of the blood and blood-forming organs and certain disorders involving the immune mechanism: Secondary | ICD-10-CM | POA: Insufficient documentation

## 2015-05-04 NOTE — ED Provider Notes (Signed)
CSN: [id number]     Arrival date & time 05/04/15  1926 History   First MD Initiated Contact with Patient 05/04/15 2027     Chief Complaint  Patient presents with  . Fall     (Consider location/radiation/quality/duration/timing/severity/associated sxs/prior Treatment) Patient is a 49 y.o. female presenting with fall. The history is provided by the patient.  Fall This is a new problem. The current episode started today. The problem occurs constantly. The problem has been unchanged.   Kayla Choi is a 49 y.o. female who presents to the ED with left elbow pain. She reports falling down carpet steps at her apartment. She is unsure of how many steps but thinks maybe 8. Unsure why she fell. States that her [facility] is out and she was hot. She denies injuries other than left elbow.   Past Medical History  Diagnosis Date  . Hypertension   . COPD (chronic obstructive pulmonary disease)   . Hyperlipidemia   . Prediabetes   . Anxiety     with social phobia  . Anemia     iron deficinecy  . Depression   . ADD (attention deficit disorder)   . Glaucoma   . Diabetes mellitus   . Social phobia    Past Surgical History  Procedure Laterality Date  . Cholecystectomy    . Right elbow      reconstruction  . Abdominal hysterectomy      fibroids, both ovaries left intact  . Cosmetic surgery      elbow  . Lipoma excision  04/30/2012    Procedure: EXCISION LIPOMA;  Surgeon: Donato Heinz, MD;  Location: [facility];  Service: General;  Laterality: Right;  Excision soft tissue mass right thigh  . Fracture surgery      Recurrent elbow surgery   Family History  Problem Relation Age of Onset  . Hypertension Mother   . Diabetes Father   . Heart disease Father   . Anesthesia problems Neg Hx   . Malignant hyperthermia Neg Hx   . Hypotension Neg Hx   . Pseudochol deficiency Neg Hx    History  Substance Use Topics  . Smoking status: Never Smoker   . Smokeless tobacco: Never Used  . Alcohol Use: No    OB History    Gravida Para Term Preterm AB TAB SAB Ectopic Multiple Living   2 2 2       2      Review of Systems Negative except as stated in HPI   Allergies  Aspirin; Metformin and related; Other; Latex; Neosporin; and Oxycodone  Home Medications   Prior to Admission medications   Medication Sig Start Date End Date Taking? Authorizing Provider  albuterol (PROVENTIL HFA;VENTOLIN HFA) 108 (90 BASE) MCG/ACT inhaler Inhale 2 puffs into the lungs every 6 (six) hours as needed for wheezing or shortness of breath. 07/20/13   Alycia Rossetti, MD  albuterol (PROVENTIL) (2.5 MG/3ML) 0.083% nebulizer solution Take 3 mLs (2.5 mg total) by nebulization every 4 (four) hours as needed for wheezing or shortness of breath. 10/06/14   Alycia Rossetti, MD  amLODipine (NORVASC) 5 MG tablet TAKE 1 TABLET BY MOUTH ONCE DAILY. 01/31/15   Alycia Rossetti, MD  budesonide-formoterol [facility]) 160-4.5 MCG/ACT inhaler Inhale 2 puffs into the lungs 2 (two) times daily. 07/20/13   Alycia Rossetti, MD  buPROPion (WELLBUTRIN XL) 300 MG 24 hr tablet Take 1 tablet (300 mg total) by mouth daily. 07/20/13   Alycia Rossetti, MD  clonazePAM (KLONOPIN) 0.5 MG tablet TAKE 1 TABLET BY MOUTH TWICE DAILY AS NEEDED FOR ANXIETY. 04/27/15   Alycia Rossetti, MD  JANUVIA 100 MG tablet TAKE ONE TABLET BY MOUTH ONCE DAILY. 01/31/15   Alycia Rossetti, MD  lisinopril-hydrochlorothiazide (PRINZIDE,ZESTORETIC) 20-12.5 MG per tablet TAKE 2 TABLETS BY MOUTH DAILY. 04/27/15   Alycia Rossetti, MD  meloxicam (MOBIC) 7.5 MG tablet Take 1 tablet (7.5 mg total) by mouth daily. 10/06/14   Alycia Rossetti, MD  rosuvastatin (CRESTOR) 20 MG tablet Take 1 tablet (20 mg total) by mouth daily. 03/02/15   Alycia Rossetti, MD  sertraline (ZOLOFT) 100 MG tablet Take 1 tablet (100 mg total) by mouth daily. Patient taking differently: Take 100 mg by mouth daily. As needed 07/20/13   Alycia Rossetti, MD  tiotropium (SPIRIVA) 18 MCG inhalation capsule  Place 1 capsule (18 mcg total) into inhaler and inhale daily. 07/20/13   Alycia Rossetti, MD  topiramate (TOPAMAX) 25 MG tablet Take 25 mg by mouth at bedtime. As needed    Historical Provider, MD  traMADol (ULTRAM) 50 MG tablet TAKE (1) TABLET BY MOUTH EVERY (8) HOURS AS NEEDED FOR PAIN. 04/27/15   Alycia Rossetti, MD   BP 131/55 mmHg  Pulse 98  Temp(Src) 98.7 F (37.1 C) (Oral)  Resp 18  Ht 5\' 1"  (1.549 m)  Wt 184 lb (83.462 kg)  BMI 34.78 kg/m2  SpO2 99% Physical Exam  Constitutional: She is oriented to person, place, and time. She appears well-developed and well-nourished. No distress.  HENT:  Head: Normocephalic and atraumatic.  Right Ear: Tympanic membrane normal.  Left Ear: Tympanic membrane normal.  Nose: Nose normal.  Mouth/Throat: Uvula is midline, oropharynx is clear and moist and mucous membranes are normal.  Eyes: Conjunctivae and EOM are normal. Pupils are equal, round, and reactive to light.  Neck: Normal range of motion. Neck supple.  Cardiovascular: Normal rate.   Pulmonary/Chest: Effort normal.  Abdominal: There is tenderness.  Musculoskeletal: Normal range of motion.       Left elbow: She exhibits normal range of motion, no swelling, no effusion, no deformity and no laceration. Tenderness found. No olecranon process tenderness noted.  Radial pulses 2+ bilateral, adequate circulation.   Neurological: She is alert and oriented to person, place, and time. No cranial nerve deficit.  Skin: Skin is warm and dry.  Psychiatric: She has a normal mood and affect. Her behavior is normal.  Nursing note and vitals reviewed.   ED Course  Procedures (including critical care time) X-ray, ice, sling, ibuprofen Labs Review Labs Reviewed - No data to display  Imaging Review Dg Elbow Complete Left  05/04/2015   CLINICAL DATA:  Pt. Fell down several stairs a few times over the last few days. Pain to entire elbow.  EXAM: LEFT ELBOW - COMPLETE 3+ VIEW  COMPARISON:  None.   FINDINGS: There is no evidence of fracture, dislocation, or joint effusion. There is no evidence of arthropathy or other focal bone abnormality. Soft tissues are unremarkable.  IMPRESSION: Negative.   Electronically Signed   By: Lajean Manes M.D.   On: 05/04/2015 20:48    MDM  49 y.o. female with pain to the left elbow s/p fall on carpeted steps in her apartment. Stable for d/c without neurovascular compromise. Ice, elevate, ibuprofen for pain. Discussed with the patient clinical and x-ray findings and all questioned fully answered. She will return if any problems arise.   Final diagnoses:  Contusion  of left elbow, initial encounter       [facility], NP 05/04/15 2112  Nat Christen, MD 05/04/15 2322

## 2015-05-04 NOTE — ED Notes (Signed)
Tingling in my left arm per pt.

## 2015-05-04 NOTE — ED Notes (Signed)
Pt made aware to return if symptoms worsen or if any life threatening symptoms occur.   

## 2015-05-04 NOTE — ED Notes (Signed)
[address] down the steps at my apartment. I don't know how many steps I fell down, may have been 8 steps. I know I did not lose my footing, I was not sleepy, did not slip. Don't know why I fell. Left elbow is hurting per pt.

## 2015-05-04 NOTE — Discharge Instructions (Signed)
Contusion °A contusion is a deep bruise. Contusions happen when an injury causes bleeding under the skin. Signs of bruising include pain, puffiness (swelling), and discolored skin. The contusion may turn blue, purple, or yellow. °HOME CARE  °· Put ice on the injured area. °¨ Put ice in a plastic bag. °¨ Place a towel between your skin and the bag. °¨ Leave the ice on for 15-20 minutes, 03-04 times a day. °· Only take medicine as told by your doctor. °· Rest the injured area. °· If possible, raise (elevate) the injured area to lessen puffiness. °GET HELP RIGHT AWAY IF:  °· You have more bruising or puffiness. °· You have pain that is getting worse. °· Your puffiness or pain is not helped by medicine. °MAKE SURE YOU:  °· Understand these instructions. °· Will watch your condition. °· Will get help right away if you are not doing well or get worse. °Document Released: 05/12/2008 Document Revised: 02/16/2012 Document Reviewed: 09/29/2011 °[employer]® Patient Information ©2015 [employer]. This information is not intended to replace advice given to you by your health care provider. Make sure you discuss any questions you have with your health care provider. ° °

## 2015-05-05 ENCOUNTER — Encounter (HOSPITAL_COMMUNITY): Payer: Self-pay | Admitting: *Deleted

## 2015-05-05 ENCOUNTER — Emergency Department (HOSPITAL_COMMUNITY)
Admission: EM | Admit: 2015-05-05 | Discharge: 2015-05-05 | Disposition: A | Discharge status: Home / Self Care | Payer: Medicaid Other | Attending: Emergency Medicine | Admitting: Emergency Medicine

## 2015-05-05 DIAGNOSIS — Z79899 Other long term (current) drug therapy: Secondary | ICD-10-CM | POA: Diagnosis not present

## 2015-05-05 DIAGNOSIS — Z9889 Other specified postprocedural states: Secondary | ICD-10-CM | POA: Diagnosis not present

## 2015-05-05 DIAGNOSIS — E119 Type 2 diabetes mellitus without complications: Secondary | ICD-10-CM | POA: Diagnosis not present

## 2015-05-05 DIAGNOSIS — F419 Anxiety disorder, unspecified: Secondary | ICD-10-CM | POA: Diagnosis not present

## 2015-05-05 DIAGNOSIS — E785 Hyperlipidemia, unspecified: Secondary | ICD-10-CM | POA: Diagnosis not present

## 2015-05-05 DIAGNOSIS — F909 Attention-deficit hyperactivity disorder, unspecified type: Secondary | ICD-10-CM | POA: Diagnosis not present

## 2015-05-05 DIAGNOSIS — M25522 Pain in left elbow: Secondary | ICD-10-CM

## 2015-05-05 DIAGNOSIS — Z8669 Personal history of other diseases of the nervous system and sense organs: Secondary | ICD-10-CM | POA: Insufficient documentation

## 2015-05-05 DIAGNOSIS — Z7951 Long term (current) use of inhaled steroids: Secondary | ICD-10-CM | POA: Diagnosis not present

## 2015-05-05 DIAGNOSIS — M25422 Effusion, left elbow: Secondary | ICD-10-CM | POA: Insufficient documentation

## 2015-05-05 DIAGNOSIS — Z91040 Latex allergy status: Secondary | ICD-10-CM | POA: Diagnosis not present

## 2015-05-05 DIAGNOSIS — Z791 Long term (current) use of non-steroidal anti-inflammatories (NSAID): Secondary | ICD-10-CM | POA: Diagnosis not present

## 2015-05-05 DIAGNOSIS — I10 Essential (primary) hypertension: Secondary | ICD-10-CM | POA: Insufficient documentation

## 2015-05-05 DIAGNOSIS — J449 Chronic obstructive pulmonary disease, unspecified: Secondary | ICD-10-CM | POA: Diagnosis not present

## 2015-05-05 DIAGNOSIS — F329 Major depressive disorder, single episode, unspecified: Secondary | ICD-10-CM | POA: Diagnosis not present

## 2015-05-05 DIAGNOSIS — Z862 Personal history of diseases of the blood and blood-forming organs and certain disorders involving the immune mechanism: Secondary | ICD-10-CM | POA: Insufficient documentation

## 2015-05-05 NOTE — ED Notes (Signed)
L arm pain x 2 days. Was seen last night for same. Has sling on, but states "i just need to get some rest. I can't find a way to cushion it."

## 2015-05-05 NOTE — Discharge Instructions (Signed)
Follow up with your doctor.  Return here as needed 

## 2015-05-08 NOTE — ED Provider Notes (Signed)
CSN: [id number]     Arrival date & time 05/05/15  1811 History   First MD Initiated Contact with Patient 05/05/15 1825     Chief Complaint  Patient presents with  . Arm Pain     (Consider location/radiation/quality/duration/timing/severity/associated sxs/prior Treatment) HPI Kayla Choi is a 49 y.o. female who returns to the ED after her visit the previous night for left elbow pain. She had x-rays and placed in a sling and treated for pain. She states that the sling does not give enough cushion to the elbow and wants to know if we can do something else.  Past Medical History  Diagnosis Date  . Hypertension   . COPD (chronic obstructive pulmonary disease)   . Hyperlipidemia   . Prediabetes   . Anxiety     with social phobia  . Anemia     iron deficinecy  . Depression   . ADD (attention deficit disorder)   . Glaucoma   . Diabetes mellitus   . Social phobia    Past Surgical History  Procedure Laterality Date  . Cholecystectomy    . Right elbow      reconstruction  . Abdominal hysterectomy      fibroids, both ovaries left intact  . Cosmetic surgery      elbow  . Lipoma excision  04/30/2012    Procedure: EXCISION LIPOMA;  Surgeon: Donato Heinz, MD;  Location: [facility];  Service: General;  Laterality: Right;  Excision soft tissue mass right thigh  . Fracture surgery      Recurrent elbow surgery   Family History  Problem Relation Age of Onset  . Hypertension Mother   . Diabetes Father   . Heart disease Father   . Anesthesia problems Neg Hx   . Malignant hyperthermia Neg Hx   . Hypotension Neg Hx   . Pseudochol deficiency Neg Hx    History  Substance Use Topics  . Smoking status: Never Smoker   . Smokeless tobacco: Never Used  . Alcohol Use: No   OB History    Gravida Para Term Preterm AB TAB SAB Ectopic Multiple Living   2 2 2       2      Review of Systems Negative except as stated in HPI   Allergies  Aspirin; Metformin and related; Other; Latex;  Neosporin; and Oxycodone  Home Medications   Prior to Admission medications   Medication Sig Start Date End Date Taking? Authorizing Provider  albuterol (PROVENTIL HFA;VENTOLIN HFA) 108 (90 BASE) MCG/ACT inhaler Inhale 2 puffs into the lungs every 6 (six) hours as needed for wheezing or shortness of breath. 07/20/13   Alycia Rossetti, MD  albuterol (PROVENTIL) (2.5 MG/3ML) 0.083% nebulizer solution Take 3 mLs (2.5 mg total) by nebulization every 4 (four) hours as needed for wheezing or shortness of breath. 10/06/14   Alycia Rossetti, MD  amLODipine (NORVASC) 5 MG tablet TAKE 1 TABLET BY MOUTH ONCE DAILY. 01/31/15   Alycia Rossetti, MD  budesonide-formoterol [facility]) 160-4.5 MCG/ACT inhaler Inhale 2 puffs into the lungs 2 (two) times daily. 07/20/13   Alycia Rossetti, MD  buPROPion (WELLBUTRIN XL) 300 MG 24 hr tablet Take 1 tablet (300 mg total) by mouth daily. 07/20/13   Alycia Rossetti, MD  clonazePAM (KLONOPIN) 0.5 MG tablet TAKE 1 TABLET BY MOUTH TWICE DAILY AS NEEDED FOR ANXIETY. 04/27/15   Alycia Rossetti, MD  JANUVIA 100 MG tablet TAKE ONE TABLET BY MOUTH ONCE DAILY. 01/31/15  Alycia Rossetti, MD  lisinopril-hydrochlorothiazide (PRINZIDE,ZESTORETIC) 20-12.5 MG per tablet TAKE 2 TABLETS BY MOUTH DAILY. 04/27/15   Alycia Rossetti, MD  meloxicam (MOBIC) 7.5 MG tablet Take 1 tablet (7.5 mg total) by mouth daily. 10/06/14   Alycia Rossetti, MD  rosuvastatin (CRESTOR) 20 MG tablet Take 1 tablet (20 mg total) by mouth daily. 03/02/15   Alycia Rossetti, MD  sertraline (ZOLOFT) 100 MG tablet Take 1 tablet (100 mg total) by mouth daily. Patient taking differently: Take 100 mg by mouth daily. As needed 07/20/13   Alycia Rossetti, MD  tiotropium (SPIRIVA) 18 MCG inhalation capsule Place 1 capsule (18 mcg total) into inhaler and inhale daily. 07/20/13   Alycia Rossetti, MD  topiramate (TOPAMAX) 25 MG tablet Take 25 mg by mouth at bedtime. As needed    Historical Provider, MD  traMADol (ULTRAM) 50  MG tablet TAKE (1) TABLET BY MOUTH EVERY (8) HOURS AS NEEDED FOR PAIN. 04/27/15   Alycia Rossetti, MD   BP 138/78 mmHg  Pulse 96  Temp(Src) 98.3 F (36.8 C) (Oral)  Ht 5\' 1"  (1.549 m)  Wt 180 lb (81.647 kg)  BMI 34.03 kg/m2  SpO2 100% Physical Exam  Constitutional: She is oriented to person, place, and time. She appears well-developed and well-nourished.  HENT:  Head: Normocephalic.  Eyes: EOM are normal.  Neck: Neck supple.  Cardiovascular: Normal rate.   Pulmonary/Chest: Effort normal.  Abdominal: Soft. There is no tenderness.  Musculoskeletal:  Left elbow with minimal swelling, tender on palpation, radial pulses 2+ bilateral, equal grips.   Neurological: She is alert and oriented to person, place, and time. No cranial nerve deficit.  Skin: Skin is warm and dry.  Psychiatric: She has a normal mood and affect. Her behavior is normal.  Nursing note and vitals reviewed.   ED Course  Procedures  Dr. Wilson Singer in to see the patient  MDM  49 y.o. female with continued left elbow pain. Ace wrap applied and patient feeling better. Stable for d/c without neurovascular compromise. Discussed with the patient follow up plan of care. She will follow up with her PCP or return here for problems.   Final diagnoses:  Elbow pain, left       [facility], NP 05/08/15 1551  Virgel Manifold, MD 05/10/15 [phone]

## 2015-05-10 ENCOUNTER — Telehealth: Payer: Self-pay | Admitting: *Deleted

## 2015-05-10 MED ORDER — TAVABOROLE 5 % EX SOLN: 1.0000 [drp] | Freq: Every day | CUTANEOUS | Status: DC

## 2015-05-10 NOTE — Telephone Encounter (Signed)
Dr Amalia Hailey reviewed pt's [place] fungal culture of 03/26/2015, states possible distal onychomycosis, could consider topical Kerydin.  I informed pt of Dr. Phoebe Perch recommendation, and informed pt I would send the [place] Mon to [facility] and they would contact her and home deliver if the insurance covered.  Pt state understanding.

## 2015-05-17 ENCOUNTER — Telehealth: Payer: Self-pay | Admitting: *Deleted

## 2015-05-17 NOTE — Telephone Encounter (Addendum)
Kayla Choi states the Kayla Choi could not get the Kayla Choi's Kayla Choi authorized for payment.  Kayla Choi request other options.  I informed Kayla Choi I would call 05/22/2015 after receiving instructions from Dr. Amalia Hailey.  Left message to call for instructions. Kayla Choi called for instructions. I left the instructions per Dr. Phoebe Perch recommendations of OTC urea cream in the am and Formula 3 in the pm to toenails.

## 2015-05-24 ENCOUNTER — Other Ambulatory Visit: Payer: Self-pay | Admitting: Family Medicine

## 2015-05-24 NOTE — Telephone Encounter (Signed)
Refill appropriate and filled per protocol. 

## 2015-05-31 ENCOUNTER — Ambulatory Visit (INDEPENDENT_AMBULATORY_CARE_PROVIDER_SITE_OTHER): Payer: Medicaid Other | Admitting: Physician Assistant

## 2015-05-31 ENCOUNTER — Encounter: Payer: Self-pay | Admitting: Physician Assistant

## 2015-05-31 VITALS — BP 128/68 | HR 68 | Temp 98.0°F | Resp 14 | Ht 62.0 in | Wt 183.0 lb

## 2015-05-31 DIAGNOSIS — N760 Acute vaginitis: Secondary | ICD-10-CM | POA: Diagnosis not present

## 2015-05-31 LAB — URINALYSIS, ROUTINE W REFLEX MICROSCOPIC
Glucose, UA: NEGATIVE mg/dL
Nitrite: NEGATIVE
Protein, ur: 30 mg/dL — AB
Specific Gravity, Urine: >1.03 — ABNORMAL HIGH (ref 1.005–1.030)
Urobilinogen, UA: 1 mg/dL (ref 0.0–1.0)
pH: 7.5 (ref 5.0–8.0)

## 2015-05-31 LAB — WET PREP FOR TRICH, YEAST, CLUE
Clue Cells Wet Prep HPF POC: NONE SEEN
Trich, Wet Prep: NONE SEEN

## 2015-05-31 LAB — URINALYSIS, MICROSCOPIC ONLY
CASTS: NONE SEEN
CRYSTALS: NONE SEEN

## 2015-05-31 MED ORDER — METRONIDAZOLE 500 MG PO TABS: 500.0000 mg | ORAL_TABLET | Freq: Two times a day (BID) | ORAL | Status: DC

## 2015-05-31 MED ORDER — FLUCONAZOLE 150 MG PO TABS: ORAL_TABLET | ORAL | Status: DC

## 2015-05-31 NOTE — Progress Notes (Signed)
Patient ID: Karlin Binion MRN: [id number], DOB: [birth year], 49 y.o. Date of Encounter: 05/31/2015, 2:56 PM    Chief Complaint:  Chief Complaint  Patient presents with  . Yeast Infection    is taking ABTx for tooth extration (will be on ABTx until 07/20/15)- has severe vaginal itching, thick white discharge noted on 05/30/15     HPI: 49 y.o. year old female presents with above symptoms. Says that she is having a lot of itching and irritation in her vaginal area and also discharge. No other complaints or concerns. She has had no abdominal pain or pelvic pain or back pain.     Home Meds:   Outpatient Prescriptions Prior to Visit  Medication Sig Dispense Refill  . albuterol (PROVENTIL HFA;VENTOLIN HFA) 108 (90 BASE) MCG/ACT inhaler Inhale 2 puffs into the lungs every 6 (six) hours as needed for wheezing or shortness of breath. 1 Inhaler 6  . albuterol (PROVENTIL) (2.5 MG/3ML) 0.083% nebulizer solution Take 3 mLs (2.5 mg total) by nebulization every 4 (four) hours as needed for wheezing or shortness of breath. 150 mL 1  . amLODipine (NORVASC) 5 MG tablet TAKE 1 TABLET BY MOUTH ONCE DAILY. 30 tablet 1  . budesonide-formoterol (SYMBICORT) 160-4.5 MCG/ACT inhaler Inhale 2 puffs into the lungs 2 (two) times daily. 1 Inhaler 6  . buPROPion (WELLBUTRIN XL) 300 MG 24 hr tablet Take 1 tablet (300 mg total) by mouth daily. 30 tablet 6  . clonazePAM (KLONOPIN) 0.5 MG tablet TAKE 1 TABLET BY MOUTH TWICE DAILY AS NEEDED FOR ANXIETY. 20 tablet 1  . JANUVIA 100 MG tablet TAKE ONE TABLET BY MOUTH ONCE DAILY. 30 tablet 3  . lisinopril-hydrochlorothiazide (PRINZIDE,ZESTORETIC) 20-12.5 MG per tablet TAKE 2 TABLETS BY MOUTH DAILY. 60 tablet 2  . meloxicam (MOBIC) 7.5 MG tablet Take 1 tablet (7.5 mg total) by mouth daily. 30 tablet 2  . rosuvastatin (CRESTOR) 20 MG tablet Take 1 tablet (20 mg total) by mouth daily. 30 tablet 3  . sertraline (ZOLOFT) 100 MG tablet Take 1 tablet (100 mg total) by mouth daily.  (Patient taking differently: Take 100 mg by mouth daily. As needed) 30 tablet 6  . Tavaborole (KERYDIN) 5 % SOLN Apply 1 drop topically daily. 1 Bottle 11  . tiotropium (SPIRIVA) 18 MCG inhalation capsule Place 1 capsule (18 mcg total) into inhaler and inhale daily. 30 capsule 6  . topiramate (TOPAMAX) 25 MG tablet Take 25 mg by mouth at bedtime. As needed    . traMADol (ULTRAM) 50 MG tablet TAKE (1) TABLET BY MOUTH EVERY (8) HOURS AS NEEDED FOR PAIN. 45 tablet 1   No facility-administered medications prior to visit.    Allergies:  Allergies  Allergen Reactions  . Aspirin Shortness Of Breath and Itching  . Metformin And Related Anaphylaxis  . Other Anaphylaxis    Green peas  . Latex     REACTION: Rash and itching  . Neosporin [Neomycin-Bacitracin Zn-Polymyx] Other (See Comments)    Reaction: swelling,rash and skin peeling  . Oxycodone Itching      Review of Systems: See HPI for pertinent ROS. All other ROS negative.    Physical Exam: Blood pressure 128/68, pulse 68, temperature 98 F (36.7 C), temperature source Oral, resp. rate 14, height 5\' 2"  (1.575 m), weight 183 lb (83.008 kg)., Body mass index is 33.46 kg/(m^2). General:  Obese AAF. Appears in no acute distress. Lungs: Clear bilaterally to auscultation without wheezes, rales, or rhonchi. Breathing is unlabored. Heart: Regular rhythm. No  murmurs, rubs, or gallops. Pelvic Exam: External genitalia normal. Vaginal mucosa normal. Large amount of thick but liquidy yellow-green discharge present. Msk:  Strength and tone normal for age. Extremities/Skin: Warm and dry. Neuro: Alert and oriented X 3. Moves all extremities spontaneously. Gait is normal. CNII-XII grossly in tact. Psych:  Responds to questions appropriately with a normal affect.     ASSESSMENT AND PLAN:  49 y.o. year old female with  1. Vaginitis and vulvovaginitis - GC/Chlamydia Probe Amp - WET PREP FOR TRICH, YEAST, CLUE  She is to continue antibiotic until  07/20/15.  Discussed findings with lab technician. Says that wet prep shows tons of white cells but no definite clues. Says that there are so many white cells it's hard to see much else.She does see a few yeast. She also obtained a urine and recommend sending urine for culture. Follow-up urine culture and gonorrhea chlamydia swab.  In the interim, given history (abx) and exam findings, will go ahead and prescribe Diflucan and Metronidazole to treat Yeast and BV.  Marin Olp [place], [place], [facility] 05/31/2015 2:56 PM

## 2015-06-01 ENCOUNTER — Telehealth: Payer: Self-pay | Admitting: Family Medicine

## 2015-06-01 LAB — GC/CHLAMYDIA PROBE AMP
CT PROBE, AMP APTIMA: NEGATIVE
GC Probe RNA: NEGATIVE

## 2015-06-01 MED ORDER — NITROFURANTOIN MONOHYD MACRO 100 MG PO CAPS: 100.0000 mg | ORAL_CAPSULE | Freq: Two times a day (BID) | ORAL | Status: DC

## 2015-06-01 NOTE — Telephone Encounter (Signed)
Pt made aware of results and Rx to pharmacy

## 2015-06-01 NOTE — Telephone Encounter (Signed)
-----   Message from Orlena Sheldon, PA-C sent at 06/01/2015 12:50 PM EDT ----- Juluis Rainier:  At [place] yesterday, I did not see completed UA report.   Tell patient that she needs to take the medications that I prescribed at the office visit. However tell her that I need to add an additional medication--- Macrobid 100mg  1 po BID x 3 days # 6+0.

## 2015-06-02 LAB — URINE CULTURE

## 2015-06-22 ENCOUNTER — Other Ambulatory Visit: Payer: Self-pay | Admitting: Family Medicine

## 2015-07-02 ENCOUNTER — Ambulatory Visit: Payer: Medicaid Other | Admitting: Family Medicine

## 2015-07-06 ENCOUNTER — Ambulatory Visit (INDEPENDENT_AMBULATORY_CARE_PROVIDER_SITE_OTHER): Payer: Medicaid Other | Admitting: Family Medicine

## 2015-07-06 ENCOUNTER — Encounter: Payer: Self-pay | Admitting: Family Medicine

## 2015-07-06 VITALS — BP 128/70 | HR 82 | Temp 98.4°F | Resp 16 | Ht 62.0 in | Wt 178.0 lb

## 2015-07-06 DIAGNOSIS — K645 Perianal venous thrombosis: Secondary | ICD-10-CM | POA: Diagnosis not present

## 2015-07-06 DIAGNOSIS — M545 Low back pain, unspecified: Secondary | ICD-10-CM

## 2015-07-06 DIAGNOSIS — I10 Essential (primary) hypertension: Secondary | ICD-10-CM | POA: Diagnosis not present

## 2015-07-06 DIAGNOSIS — E785 Hyperlipidemia, unspecified: Secondary | ICD-10-CM | POA: Diagnosis not present

## 2015-07-06 DIAGNOSIS — E119 Type 2 diabetes mellitus without complications: Secondary | ICD-10-CM

## 2015-07-06 DIAGNOSIS — Z113 Encounter for screening for infections with a predominantly sexual mode of transmission: Secondary | ICD-10-CM | POA: Diagnosis not present

## 2015-07-06 LAB — LIPID PANEL
CHOL/HDL RATIO: 4.2 ratio (ref <=5.0)
CHOLESTEROL: 230 mg/dL — AB (ref 125–200)
HDL: 55 mg/dL (ref >=46)
LDL Cholesterol: 155 mg/dL — ABNORMAL HIGH (ref <130)
Triglycerides: 98 mg/dL (ref <150)
VLDL: 20 mg/dL (ref <30)

## 2015-07-06 LAB — CBC WITH DIFFERENTIAL/PLATELET
Basophils Absolute: 0 10*3/uL (ref 0.0–0.1)
Basophils Relative: 1 % (ref 0–1)
EOS PCT: 1 % (ref 0–5)
Eosinophils Absolute: 0 10*3/uL (ref 0.0–0.7)
HEMATOCRIT: 43 % (ref 36.0–46.0)
Hemoglobin: 14.3 g/dL (ref 12.0–15.0)
Lymphocytes Relative: 38 % (ref 12–46)
Lymphs Abs: 1.6 10*3/uL (ref 0.7–4.0)
MCH: 31.3 pg (ref 26.0–34.0)
MCHC: 33.3 g/dL (ref 30.0–36.0)
MCV: 94.1 fL (ref 78.0–100.0)
MONOS PCT: 7 % (ref 3–12)
MPV: 10.7 fL (ref 8.6–12.4)
Monocytes Absolute: 0.3 10*3/uL (ref 0.1–1.0)
NEUTROS ABS: 2.2 10*3/uL (ref 1.7–7.7)
Neutrophils Relative %: 53 % (ref 43–77)
Platelets: 361 10*3/uL (ref 150–400)
RBC: 4.57 MIL/uL (ref 3.87–5.11)
RDW: 12.8 % (ref 11.5–15.5)
WBC: 4.1 10*3/uL (ref 4.0–10.5)

## 2015-07-06 LAB — WET PREP FOR TRICH, YEAST, CLUE
TRICH WET PREP: NONE SEEN
Yeast Wet Prep HPF POC: NONE SEEN

## 2015-07-06 LAB — COMPREHENSIVE METABOLIC PANEL
ALK PHOS: 36 U/L (ref 33–115)
ALT: 22 U/L (ref 6–29)
AST: 19 U/L (ref 10–35)
Albumin: 4.3 g/dL (ref 3.6–5.1)
BUN: 11 mg/dL (ref 7–25)
CALCIUM: 9.6 mg/dL (ref 8.6–10.2)
CO2: 25 mmol/L (ref 20–31)
Chloride: 101 mmol/L (ref 98–110)
Creat: 0.84 mg/dL (ref 0.50–1.10)
Glucose, Bld: 136 mg/dL — ABNORMAL HIGH (ref 70–99)
Potassium: 4.2 mmol/L (ref 3.5–5.3)
Sodium: 140 mmol/L (ref 135–146)
Total Bilirubin: 1.5 mg/dL — ABNORMAL HIGH (ref 0.2–1.2)
Total Protein: 7.4 g/dL (ref 6.1–8.1)

## 2015-07-06 LAB — HIV ANTIBODY (ROUTINE TESTING W REFLEX): HIV: NONREACTIVE

## 2015-07-06 LAB — HEMOGLOBIN A1C
Hgb A1c MFr Bld: 7 % — ABNORMAL HIGH (ref <5.7)
Mean Plasma Glucose: 154 mg/dL — ABNORMAL HIGH (ref <117)

## 2015-07-06 NOTE — Assessment & Plan Note (Signed)
Well controlled, no change to meds 

## 2015-07-06 NOTE — Assessment & Plan Note (Signed)
Xray Lumbar spine, with falls due to right leg giving out, no signs of spinal stenosis on exam

## 2015-07-06 NOTE — Assessment & Plan Note (Signed)
Last A1C 6.9%m goal < 7%, weight loss noted , continue meds

## 2015-07-06 NOTE — Progress Notes (Signed)
Patient ID: Kayla Choi, female   DOB: 1966/09/13, 49 y.o.   MRN: [id number]   Subjective:    Patient ID: Kayla Choi, female    DOB: 1966-08-06, 49 y.o.   MRN: [id number]  Patient presents for 4 month F/U; Rectal Bump; and STD Check  patient here to follow-up chronic medical problems. She is in a new relationship will like a full STD screen. She also has a bump near her rectal area that came up a couple days ago it is not tender she's not had any bleeding with bowel movements. She denies any vaginal discharge.  She is intentionally losing weight and she is down 8 pounds. She states her blood sugars have been good but she did not bring her meter with her today. She is still following up with psychiatry for her depression social phobias and panic attacks.  She continues to have back pain she states that she fell again at her home when she slipped on the stairs. She also states that she fell in the kitchen after her right leg gives out it seems that it is always her right leg gives out and then she will fall. She denies any tingling or numbness in the leg. She has not taken any in the tramadol which we gave her for her shoulder pain. She denies any dizziness seizure activity prior to these falls    Review Of Systems:  GEN- denies fatigue, fever, weight loss,weakness, recent illness HEENT- denies eye drainage, change in vision, nasal discharge, CVS- denies chest pain, palpitations RESP- denies SOB, cough, wheeze ABD- denies N/V, change in stools, abd pain GU- denies dysuria, hematuria, dribbling, incontinence MSK- +oint pain, muscle aches, injury Neuro- denies headache, dizziness, syncope, seizure activity       Objective:    BP 128/70 mmHg  Pulse 82  Temp(Src) 98.4 F (36.9 C) (Oral)  Resp 16  Ht 5\' 2"  (1.575 m)  Wt 178 lb (80.74 kg)  BMI 32.55 kg/m2 GEN- NAD, alert and oriented x3 HEENT- PERRL, EOMI, non injected sclera, pink conjunctiva, MMM, oropharynx clear Neck- Supple,  no thyromegaly CVS- RRR, no murmur RESP-CTAB ABD-NABS,soft,NT,ND GU- normal external genitalia, vaginal mucosa pink and moist, no uterus/cervix + discharge,, no ovarian masses, Rectum- no gross blood, 11 o-clock, grape size thrombosed Hemorroid, NT, no erythema  Neuro- CNII-XII intact, no deficits MSK- TTP left shoulder blade, TTP lumbar spine, Neg SLR, fair ROM EXT- No edema Pulses- Radial, DP- 2+        Assessment & Plan:      Problem List Items Addressed This Visit    Hyperlipidemia - Primary   Relevant Orders   Lipid panel   Essential hypertension    Well controlled, no change to meds      Relevant Orders   CBC with Differential/Platelet   Comprehensive metabolic panel   Diabetes mellitus, type II    Last A1C 6.9%m goal < 7%, weight loss noted , continue meds      Relevant Orders   Hemoglobin A1c   Lipid panel   Back pain    Xray Lumbar spine, with falls due to right leg giving out, no signs of spinal stenosis on exam      Relevant Orders   DG Lumbar Spine Complete    Other Visit Diagnoses    Screen for STD (sexually transmitted disease)        Relevant Orders    WET PREP FOR TRICH, YEAST, CLUE (Completed)    GC/Chlamydia Probe Amp  HIV antibody    RPR    HSV(herpes simplex vrs) 1+2 ab-IgG    Thrombosed hemorrhoids        small thrombosed hemorroid, discussed bowels, sitz baths, NT, will not excise, if does not go down, refer for removal       Note: This dictation was prepared with Dragon dictation along with smaller phrase technology. Any transcriptional errors that result from this process are unintentional.

## 2015-07-06 NOTE — Patient Instructions (Signed)
Get the xray of your back Take the ultram for pain We will call with lab results Continue current medications F/U 3 months

## 2015-07-07 LAB — GC/CHLAMYDIA PROBE AMP
CT Probe RNA: NEGATIVE
GC PROBE AMP APTIMA: NEGATIVE

## 2015-07-07 LAB — RPR

## 2015-07-09 ENCOUNTER — Encounter: Payer: Self-pay | Admitting: *Deleted

## 2015-07-09 LAB — HSV(HERPES SIMPLEX VRS) I + II AB-IGG: HSV 1 Glycoprotein G Ab, IgG: 1.96 IV — ABNORMAL HIGH

## 2015-07-25 ENCOUNTER — Other Ambulatory Visit: Payer: Self-pay | Admitting: Family Medicine

## 2015-08-11 ENCOUNTER — Emergency Department (HOSPITAL_COMMUNITY)
Admission: EM | Admit: 2015-08-11 | Discharge: 2015-08-12 | Disposition: A | Discharge status: Home / Self Care | Payer: Medicaid Other | Attending: Emergency Medicine | Admitting: Emergency Medicine

## 2015-08-11 ENCOUNTER — Encounter (HOSPITAL_COMMUNITY): Payer: Self-pay | Admitting: Emergency Medicine

## 2015-08-11 ENCOUNTER — Emergency Department (HOSPITAL_COMMUNITY): Payer: Medicaid Other

## 2015-08-11 DIAGNOSIS — J441 Chronic obstructive pulmonary disease with (acute) exacerbation: Secondary | ICD-10-CM | POA: Diagnosis not present

## 2015-08-11 DIAGNOSIS — F329 Major depressive disorder, single episode, unspecified: Secondary | ICD-10-CM | POA: Diagnosis not present

## 2015-08-11 DIAGNOSIS — I10 Essential (primary) hypertension: Secondary | ICD-10-CM | POA: Diagnosis not present

## 2015-08-11 DIAGNOSIS — H409 Unspecified glaucoma: Secondary | ICD-10-CM | POA: Diagnosis not present

## 2015-08-11 DIAGNOSIS — E119 Type 2 diabetes mellitus without complications: Secondary | ICD-10-CM | POA: Diagnosis not present

## 2015-08-11 DIAGNOSIS — Z79899 Other long term (current) drug therapy: Secondary | ICD-10-CM | POA: Insufficient documentation

## 2015-08-11 DIAGNOSIS — F419 Anxiety disorder, unspecified: Secondary | ICD-10-CM | POA: Insufficient documentation

## 2015-08-11 DIAGNOSIS — Z7951 Long term (current) use of inhaled steroids: Secondary | ICD-10-CM | POA: Diagnosis not present

## 2015-08-11 DIAGNOSIS — R079 Chest pain, unspecified: Secondary | ICD-10-CM | POA: Diagnosis not present

## 2015-08-11 DIAGNOSIS — R110 Nausea: Secondary | ICD-10-CM | POA: Diagnosis not present

## 2015-08-11 DIAGNOSIS — Z862 Personal history of diseases of the blood and blood-forming organs and certain disorders involving the immune mechanism: Secondary | ICD-10-CM | POA: Insufficient documentation

## 2015-08-11 DIAGNOSIS — Z91040 Latex allergy status: Secondary | ICD-10-CM | POA: Insufficient documentation

## 2015-08-11 DIAGNOSIS — F909 Attention-deficit hyperactivity disorder, unspecified type: Secondary | ICD-10-CM | POA: Diagnosis not present

## 2015-08-11 DIAGNOSIS — Z8719 Personal history of other diseases of the digestive system: Secondary | ICD-10-CM | POA: Diagnosis not present

## 2015-08-11 HISTORY — DX: Gastro-esophageal reflux disease without esophagitis: K21.9

## 2015-08-11 LAB — CBC
HEMATOCRIT: 39.3 % (ref 36.0–46.0)
Hemoglobin: 13.2 g/dL (ref 12.0–15.0)
MCH: 31.1 pg (ref 26.0–34.0)
MCHC: 33.6 g/dL (ref 30.0–36.0)
MCV: 92.5 fL (ref 78.0–100.0)
PLATELETS: 434 10*3/uL — AB (ref 150–400)
RBC: 4.25 MIL/uL (ref 3.87–5.11)
RDW: 12.6 % (ref 11.5–15.5)
WBC: 8.2 10*3/uL (ref 4.0–10.5)

## 2015-08-11 LAB — COMPREHENSIVE METABOLIC PANEL
ALT: 20 U/L (ref 14–54)
ANION GAP: 9 (ref 5–15)
AST: 21 U/L (ref 15–41)
Albumin: 4.1 g/dL (ref 3.5–5.0)
Alkaline Phosphatase: 37 U/L — ABNORMAL LOW (ref 38–126)
BILIRUBIN TOTAL: 0.8 mg/dL (ref 0.3–1.2)
BUN: 8 mg/dL (ref 6–20)
CHLORIDE: 105 mmol/L (ref 101–111)
CO2: 24 mmol/L (ref 22–32)
Calcium: 8.8 mg/dL — ABNORMAL LOW (ref 8.9–10.3)
Creatinine, Ser: 0.79 mg/dL (ref 0.44–1.00)
Glucose, Bld: 149 mg/dL — ABNORMAL HIGH (ref 65–99)
POTASSIUM: 3.6 mmol/L (ref 3.5–5.1)
Sodium: 138 mmol/L (ref 135–145)
TOTAL PROTEIN: 7.6 g/dL (ref 6.5–8.1)

## 2015-08-11 LAB — LIPASE, BLOOD: LIPASE: 23 U/L (ref 22–51)

## 2015-08-11 LAB — TROPONIN I

## 2015-08-11 MED ORDER — NITROGLYCERIN 0.4 MG SL SUBL
0.4000 mg | SUBLINGUAL_TABLET | SUBLINGUAL | Status: DC | PRN
  Administered 2015-08-12: 0.4 mg via SUBLINGUAL
  Filled 2015-08-11: qty 1

## 2015-08-11 NOTE — ED Provider Notes (Signed)
CSN: [id number]     Arrival date & time 08/11/15  2258 History   First MD Initiated Contact with Patient 08/11/15 2322     Chief Complaint  Patient presents with  . Chest Pain     Patient is a 49 y.o. female presenting with chest pain. The history is provided by the patient.  Chest Pain Pain location:  Substernal area Pain quality: tightness   Pain radiates to:  Does not radiate Pain severity:  Moderate Onset quality:  Gradual Duration:  3 days Timing:  Constant Progression:  Worsening Chronicity:  New Relieved by:  Nothing Worsened by:  Nothing tried Associated symptoms: nausea and shortness of breath   Associated symptoms: no abdominal pain, no fever and not vomiting   Risk factors: high cholesterol and hypertension   Risk factors: no coronary artery disease, no diabetes mellitus, no prior DVT/PE and no smoking   Pt reports constant chest pain for 3 days She reports associated SOB and nausea but NO vomiting She reports previous h/o similar CP Denies h/o CAD/PE She also reports back pain but does not feel CP is radiating into back   Past Medical History  Diagnosis Date  . Hypertension   . COPD (chronic obstructive pulmonary disease)   . Hyperlipidemia   . Prediabetes   . Anxiety     with social phobia  . Anemia     iron deficinecy  . Depression   . ADD (attention deficit disorder)   . Glaucoma   . Diabetes mellitus   . Social phobia   . Acid reflux    Past Surgical History  Procedure Laterality Date  . Cholecystectomy    . Right elbow      reconstruction  . Abdominal hysterectomy      fibroids, both ovaries left intact  . Cosmetic surgery      elbow  . Lipoma excision  04/30/2012    Procedure: EXCISION LIPOMA;  Surgeon: Donato Heinz, MD;  Location: [facility];  Service: General;  Laterality: Right;  Excision soft tissue mass right thigh  . Fracture surgery      Recurrent elbow surgery   Family History  Problem Relation Age of Onset  . Hypertension Mother    . Diabetes Father   . Heart disease Father   . Anesthesia problems Neg Hx   . Malignant hyperthermia Neg Hx   . Hypotension Neg Hx   . Pseudochol deficiency Neg Hx    Social History  Substance Use Topics  . Smoking status: Never Smoker   . Smokeless tobacco: Never Used  . Alcohol Use: No   OB History    Gravida Para Term Preterm AB TAB SAB Ectopic Multiple Living   2 2 2       2      Review of Systems  Constitutional: Negative for fever.  Respiratory: Positive for shortness of breath.   Cardiovascular: Positive for chest pain.  Gastrointestinal: Positive for nausea. Negative for vomiting and abdominal pain.  All other systems reviewed and are negative.     Allergies  Aspirin; Metformin and related; Other; Latex; Neosporin; and Oxycodone  Home Medications   Prior to Admission medications   Medication Sig Start Date End Date Taking? Authorizing Provider  albuterol (PROVENTIL HFA;VENTOLIN HFA) 108 (90 BASE) MCG/ACT inhaler Inhale 2 puffs into the lungs every 6 (six) hours as needed for wheezing or shortness of breath. 07/20/13   Alycia Rossetti, MD  albuterol (PROVENTIL) (2.5 MG/3ML) 0.083% nebulizer solution Take  3 mLs (2.5 mg total) by nebulization every 4 (four) hours as needed for wheezing or shortness of breath. 10/06/14   Alycia Rossetti, MD  amLODipine (NORVASC) 5 MG tablet TAKE 1 TABLET BY MOUTH ONCE DAILY. 07/25/15   Alycia Rossetti, MD  budesonide-formoterol [facility]) 160-4.5 MCG/ACT inhaler Inhale 2 puffs into the lungs 2 (two) times daily. 07/20/13   Alycia Rossetti, MD  buPROPion (WELLBUTRIN XL) 300 MG 24 hr tablet Take 1 tablet (300 mg total) by mouth daily. 07/20/13   Alycia Rossetti, MD  clonazePAM (KLONOPIN) 0.5 MG tablet TAKE 1 TABLET BY MOUTH TWICE DAILY AS NEEDED FOR ANXIETY. 04/27/15   Alycia Rossetti, MD  CRESTOR 20 MG tablet TAKE ONE TABLET BY MOUTH ONCE DAILY. 06/22/15   Alycia Rossetti, MD  fluconazole (DIFLUCAN) 150 MG tablet Take 1 tablet once a  week for 5 weeks as needed. 05/31/15   Mary B Dixon, PA-C  JANUVIA 100 MG tablet TAKE ONE TABLET BY MOUTH ONCE DAILY. 06/22/15   Alycia Rossetti, MD  lisinopril-hydrochlorothiazide (PRINZIDE,ZESTORETIC) 20-12.5 MG per tablet TAKE 2 TABLETS BY MOUTH DAILY. 07/25/15   Alycia Rossetti, MD  meloxicam (MOBIC) 7.5 MG tablet Take 1 tablet (7.5 mg total) by mouth daily. 10/06/14   Alycia Rossetti, MD  nitrofurantoin, macrocrystal-monohydrate, (MACROBID) 100 MG capsule Take 1 capsule (100 mg total) by mouth 2 (two) times daily. 06/01/15   Lonie Peak Dixon, PA-C  sertraline (ZOLOFT) 100 MG tablet Take 1 tablet (100 mg total) by mouth daily. Patient taking differently: Take 100 mg by mouth daily. As needed 07/20/13   Alycia Rossetti, MD  tiotropium (SPIRIVA) 18 MCG inhalation capsule Place 1 capsule (18 mcg total) into inhaler and inhale daily. 07/20/13   Alycia Rossetti, MD  topiramate (TOPAMAX) 25 MG tablet Take 25 mg by mouth at bedtime. As needed    Historical Provider, MD  traMADol (ULTRAM) 50 MG tablet TAKE (1) TABLET BY MOUTH EVERY (8) HOURS AS NEEDED FOR PAIN. 04/27/15   Alycia Rossetti, MD   BP 191/104 mmHg  Pulse 104  Temp(Src) 98.6 F (37 C) (Oral)  Resp 24  Ht 5\' 1"  (1.549 m)  Wt 176 lb (79.833 kg)  BMI 33.27 kg/m2  SpO2 100% Physical Exam CONSTITUTIONAL: Well developed/well nourished HEAD: Normocephalic/atraumatic EYES: EOMI/PERRL ENMT: Mucous membranes moist NECK: supple no meningeal signs SPINE/BACK:entire spine nontender CV: S1/S2 noted, no murmurs/rubs/gallops noted LUNGS: Lungs are clear to auscultation bilaterally, no apparent distress ABDOMEN: soft, nontender, no rebound or guarding, bowel sounds noted throughout abdomen GU:no cva tenderness NEURO: Pt is awake/alert/appropriate, moves all extremitiesx4.  No facial droop.   EXTREMITIES: pulses normal/equal, full ROM, no LE edema or calf tenderness SKIN: warm, color normal PSYCH: anxious  ED Course  Procedures  11:37 PM Pt  reports constant CP for 3 days EKG unchanged Labs/imaging pending She has ASA allergy HEART Score - 4 1:34 AM On second reassessment pt very anxious and tearful She was given ativan and is now resting comfortably Initial testing was unremarkable Plan to recheck ekg/troponin at 215am 3:07 AM Pt reports feeling improved She now tells me that her CP was SHARP (initially reported CP was tightness) She did not have relief with NTG Revised HEART score with inclusion for sharp CP drops her HEART score to 3 She denies pleuritic CP.  No hypoxia to suggest PE She does not have any features to suggest dissection at this time She had two troponin that are negative  I feel she is safe for d/c home Advised to call PCP followup and may need outpatient cardiology f/u We discussed strict ER return precautions  Labs Review Labs Reviewed  CBC - Abnormal; Notable for the following:    Platelets 434 (*)    All other components within normal limits  COMPREHENSIVE METABOLIC PANEL - Abnormal; Notable for the following:    Glucose, Bld 149 (*)    Calcium 8.8 (*)    Alkaline Phosphatase 37 (*)    All other components within normal limits  TROPONIN I  LIPASE, BLOOD  TROPONIN I    Imaging Review Dg Chest 2 View  08/12/2015   CLINICAL DATA:  Chest pain for 3 days, worsening tonight. Back pain, nausea and vomiting. History of hypertension, COPD, diabetes.  EXAM: CHEST  2 VIEW  COMPARISON:  Chest radiograph November 05, 2014  FINDINGS: Cardiomediastinal silhouette is normal. The lungs are clear without pleural effusions or focal consolidations. Trachea projects midline and there is no pneumothorax. Soft tissue planes and included osseous structures are non-suspicious. Surgical clips in the included right abdomen compatible with cholecystectomy.  IMPRESSION: Normal chest.   Electronically Signed   By: Elon Alas M.D.   On: 08/12/2015 00:35   I have personally reviewed and evaluated these images and lab  results as part of my medical decision-making.   EKG Interpretation   Date/Time:  Saturday August 11 2015 23:08:09 EDT Ventricular Rate:  104 PR Interval:  110 QRS Duration: 75 QT Interval:  342 QTC Calculation: 450 R Axis:   59 Text Interpretation:  Sinus tachycardia Borderline T wave abnormalities No  significant change since last tracing Confirmed by Christy Gentles  MD, Elwanda Moger  [phone]) on 08/11/2015 11:23:49 PM     Medications  nitroGLYCERIN (NITROSTAT) SL tablet 0.4 mg (0.4 mg Sublingual Given 08/12/15 0020)  LORazepam (ATIVAN) injection 2 mg (2 mg Intravenous Given 08/12/15 0052)     EKG Interpretation  Date/Time:  Sunday August 12 2015 01:47:22 EDT Ventricular Rate:  75 PR Interval:  112 QRS Duration: 75 QT Interval:  405 QTC Calculation: 452 R Axis:   57 Text Interpretation:  Sinus rhythm Borderline short PR interval tachycardia improved on this EKG Otherwise no significant change Confirmed by Christy Gentles  MD, Elenore Rota ([id number]) on 08/12/2015 2:28:58 AM        MDM   Final diagnoses:  Chest pain, unspecified chest pain type    Nursing notes including past medical history and social history reviewed and considered in documentation xrays/imaging reviewed by myself and considered during evaluation Labs/vital reviewed myself and considered during evaluation     Ripley Fraise, MD 08/12/15 [phone]

## 2015-08-11 NOTE — ED Notes (Signed)
Patient complaining of chest pain x 3 days with worsening pain tonight. Also reports back pain, nausea and vomiting. Patient states history of palpitations, anxiety, and acid reflux.

## 2015-08-12 LAB — TROPONIN I: Troponin I: <0.03 ng/mL (ref <0.031)

## 2015-08-12 MED ORDER — LORAZEPAM 2 MG/ML IJ SOLN
2.0000 mg | Freq: Once | INTRAMUSCULAR | Status: AC
  Administered 2015-08-12: 2 mg via INTRAVENOUS
  Filled 2015-08-12: qty 1

## 2015-08-12 NOTE — Discharge Instructions (Signed)

## 2015-08-12 NOTE — ED Notes (Signed)
Patient resting, eyes closed. Even rise and fall of chest. NAD noted.

## 2015-08-12 NOTE — ED Notes (Signed)
Patient verbalizes understanding of discharge instructions, home care and follow up care. Patient out of department at this time with family. 

## 2015-08-12 NOTE — ED Notes (Signed)
Patient unable to ambulate independently. Per EDP let patient wait and become more alert, and then be discharged.

## 2015-08-12 NOTE — ED Notes (Signed)
Patient refused Nitro

## 2015-08-12 NOTE — ED Provider Notes (Signed)
Pt still drowsy after ativan.   No distress noted Will allow to sleep in ED and will d/c once more awake  Ripley Fraise, MD 08/12/15 [phone]

## 2015-08-24 ENCOUNTER — Other Ambulatory Visit: Payer: Self-pay | Admitting: Family Medicine

## 2015-08-24 ENCOUNTER — Ambulatory Visit: Payer: Medicaid Other | Admitting: Family Medicine

## 2015-08-24 NOTE — Telephone Encounter (Signed)
Medication refilled per protocol. 

## 2015-10-08 ENCOUNTER — Ambulatory Visit (INDEPENDENT_AMBULATORY_CARE_PROVIDER_SITE_OTHER): Payer: Medicaid Other | Admitting: Family Medicine

## 2015-10-08 ENCOUNTER — Encounter: Payer: Self-pay | Admitting: Family Medicine

## 2015-10-08 VITALS — BP 132/74 | HR 82 | Temp 98.1°F | Resp 18 | Ht 61.0 in | Wt 168.0 lb

## 2015-10-08 DIAGNOSIS — R079 Chest pain, unspecified: Secondary | ICD-10-CM

## 2015-10-08 DIAGNOSIS — E119 Type 2 diabetes mellitus without complications: Secondary | ICD-10-CM | POA: Diagnosis not present

## 2015-10-08 DIAGNOSIS — I10 Essential (primary) hypertension: Secondary | ICD-10-CM

## 2015-10-08 DIAGNOSIS — J410 Simple chronic bronchitis: Secondary | ICD-10-CM

## 2015-10-08 DIAGNOSIS — G8929 Other chronic pain: Secondary | ICD-10-CM | POA: Diagnosis not present

## 2015-10-08 DIAGNOSIS — F411 Generalized anxiety disorder: Secondary | ICD-10-CM | POA: Diagnosis not present

## 2015-10-08 DIAGNOSIS — Z23 Encounter for immunization: Secondary | ICD-10-CM | POA: Diagnosis not present

## 2015-10-08 DIAGNOSIS — E785 Hyperlipidemia, unspecified: Secondary | ICD-10-CM

## 2015-10-08 LAB — CBC WITH DIFFERENTIAL/PLATELET
BASOS PCT: 1 % (ref 0–1)
Basophils Absolute: 0.1 10*3/uL (ref 0.0–0.1)
Eosinophils Absolute: 0.1 10*3/uL (ref 0.0–0.7)
Eosinophils Relative: 1 % (ref 0–5)
HEMATOCRIT: 41.1 % (ref 36.0–46.0)
HEMOGLOBIN: 13.7 g/dL (ref 12.0–15.0)
LYMPHS ABS: 1.8 10*3/uL (ref 0.7–4.0)
LYMPHS PCT: 34 % (ref 12–46)
MCH: 31.1 pg (ref 26.0–34.0)
MCHC: 33.3 g/dL (ref 30.0–36.0)
MCV: 93.2 fL (ref 78.0–100.0)
MONO ABS: 0.3 10*3/uL (ref 0.1–1.0)
MONOS PCT: 6 % (ref 3–12)
MPV: 10.3 fL (ref 8.6–12.4)
NEUTROS ABS: 3.1 10*3/uL (ref 1.7–7.7)
NEUTROS PCT: 58 % (ref 43–77)
Platelets: 446 10*3/uL — ABNORMAL HIGH (ref 150–400)
RBC: 4.41 MIL/uL (ref 3.87–5.11)
RDW: 13.4 % (ref 11.5–15.5)
WBC: 5.3 10*3/uL (ref 4.0–10.5)

## 2015-10-08 LAB — HEMOGLOBIN A1C
HEMOGLOBIN A1C: 6.3 % — AB (ref <5.7)
MEAN PLASMA GLUCOSE: 134 mg/dL — AB (ref <117)

## 2015-10-08 LAB — COMPREHENSIVE METABOLIC PANEL
ALBUMIN: 4.1 g/dL (ref 3.6–5.1)
ALT: 16 U/L (ref 6–29)
AST: 17 U/L (ref 10–35)
Alkaline Phosphatase: 35 U/L (ref 33–115)
BUN: 11 mg/dL (ref 7–25)
CALCIUM: 9.2 mg/dL (ref 8.6–10.2)
CHLORIDE: 103 mmol/L (ref 98–110)
CO2: 25 mmol/L (ref 20–31)
CREATININE: 0.86 mg/dL (ref 0.50–1.10)
Glucose, Bld: 113 mg/dL — ABNORMAL HIGH (ref 70–99)
Potassium: 4.4 mmol/L (ref 3.5–5.3)
SODIUM: 139 mmol/L (ref 135–146)
TOTAL PROTEIN: 7 g/dL (ref 6.1–8.1)
Total Bilirubin: 1.5 mg/dL — ABNORMAL HIGH (ref 0.2–1.2)

## 2015-10-08 LAB — LIPID PANEL
CHOLESTEROL: 215 mg/dL — AB (ref 125–200)
HDL: 55 mg/dL (ref >=46)
LDL Cholesterol: 146 mg/dL — ABNORMAL HIGH (ref <130)
Total CHOL/HDL Ratio: 3.9 Ratio (ref <=5.0)
Triglycerides: 72 mg/dL (ref <150)
VLDL: 14 mg/dL (ref <30)

## 2015-10-08 NOTE — Progress Notes (Signed)
Patient ID: Kayla Choi, female   DOB: 10/02/1966, 49 y.o.   MRN: [id number]   Subjective:    Patient ID: Kayla Choi, female    DOB: 1966-07-15, 49 y.o.   MRN: [id number]  Patient presents for 3 month F/U  patient here to follow-up medications. She was seen in the emergency room last month for chest pain again. She has been seen multiple times for intermittent chest pain which is thought to be related to anxiety. She feels like she may have had a panic attack this last time but she is not sure brought it on. Of note on review of her medication she is not taking her Wellbutrin as she thinks that it causes seizures. Please have some smaller episodes of chest pain on and off. She is active and trying to lose weight she is down 10 pounds since her last visit. She does not have any significant chest pain when she is exercising.  Diabetes mellitus her blood sugars have been less than 120 fasting the highest blood sugar has been 212 which is been a couple months ago. She is taking her medication as prescribed. She states she is also taking her cholesterol medication.  COPD- doing well as brief and Symbicort she needs flu shot today  Review Of Systems:  GEN- denies fatigue, fever, weight loss,weakness, recent illness HEENT- denies eye drainage, change in vision, nasal discharge, CVS- +chest pain, palpitations RESP- denies SOB, cough, wheeze ABD- denies N/V, change in stools, abd pain GU- denies dysuria, hematuria, dribbling, incontinence MSK- denies joint pain, muscle aches, injury Neuro- denies headache, dizziness, syncope, seizure activity       Objective:    BP 132/74 mmHg  Pulse 82  Temp(Src) 98.1 F (36.7 C) (Oral)  Resp 18  Ht 5\' 1"  (1.549 m)  Wt 168 lb (76.204 kg)  BMI 31.76 kg/m2 GEN- NAD, alert and oriented x3 HEENT- PERRL, EOMI, non injected sclera, pink conjunctiva, MMM, oropharynx clear CVS- RRR, no murmur RESP-CTAB ABD-NABS,soft,NT,ND EXT- No edema Pulses- Radial,  DP- 2+        Assessment & Plan:      Problem List Items Addressed This Visit    Hyperlipidemia - Primary   Relevant Orders   Lipid panel   Essential hypertension    Blood pressure is controlled today no change in medication      Relevant Orders   CBC with Differential/Platelet   Comprehensive metabolic panel   Diabetes mellitus, type II (HCC)    Intentional weight loss noted along with change in dietary habits. I'll recheck her A1c we may be noticed decrease her Januvia      Relevant Orders   Hemoglobin A1c   Microalbumin / creatinine urine ratio   COPD (chronic obstructive pulmonary disease) (HCC)    COPD is stable flu shot given no changed to inhalers      Anxiety state    Chronic anxiety along with depression. She is off her Wellbutrin I advised her discuss her medications with her psychiatrist. I do think anxiety plays a big role into her chest pain but this is requiring a lot of emergency room visits over the past couple years.       Other Visit Diagnoses    Recurrent chest pain        Non cardic pain but recurrent CP and has risk factors for CAD, refer to cardiology    Need for prophylactic vaccination and inoculation against influenza        Relevant Orders  Flu Vaccine QUAD 36+ mos PF IM (Fluarix & Fluzone Quad PF) (Completed)       Note: This dictation was prepared with Dragon dictation along with smaller phrase technology. Any transcriptional errors that result from this process are unintentional.

## 2015-10-08 NOTE — Assessment & Plan Note (Signed)
Chronic anxiety along with depression. She is off her Wellbutrin I advised her discuss her medications with her psychiatrist. I do think anxiety plays a big role into her chest pain but this is requiring a lot of emergency room visits over the past couple years.

## 2015-10-08 NOTE — Assessment & Plan Note (Signed)
COPD is stable flu shot given no changed to inhalers

## 2015-10-08 NOTE — Assessment & Plan Note (Signed)
Intentional weight loss noted along with change in dietary habits. I'll recheck her A1c we may be noticed decrease her Januvia

## 2015-10-08 NOTE — Patient Instructions (Signed)
We will call with lab results Flu shot given  Referral to cardiology

## 2015-10-08 NOTE — Assessment & Plan Note (Signed)
Blood pressure is controlled today no change in medication

## 2015-10-09 LAB — MICROALBUMIN / CREATININE URINE RATIO
Creatinine, Urine: 288 mg/dL (ref 20–320)
Microalb Creat Ratio: 10 mcg/mg creat (ref <30)
Microalb, Ur: 3 mg/dL

## 2015-10-24 ENCOUNTER — Other Ambulatory Visit: Payer: Self-pay | Admitting: Family Medicine

## 2015-10-24 NOTE — Telephone Encounter (Signed)
Medication refilled per protocol. 

## 2015-10-25 ENCOUNTER — Telehealth: Payer: Self-pay | Admitting: *Deleted

## 2015-10-25 NOTE — Telephone Encounter (Signed)
Received request from pharmacy for PA on Crestor.   Patient has tried and failed Zocor and Lipitor.   PA submitted via [employer].   Dx: E78.5 HLD.

## 2015-11-05 NOTE — Telephone Encounter (Signed)
Received PA determination.   PA approved 10/25/2015- 10/24/2016.  Ref# [id number]  Pharmacy made aware.

## 2015-12-07 ENCOUNTER — Ambulatory Visit (INDEPENDENT_AMBULATORY_CARE_PROVIDER_SITE_OTHER): Payer: Medicaid Other | Admitting: Family Medicine

## 2015-12-07 ENCOUNTER — Encounter: Payer: Self-pay | Admitting: Family Medicine

## 2015-12-07 VITALS — BP 136/70 | HR 78 | Temp 97.8°F | Resp 16 | Ht 61.0 in | Wt 163.0 lb

## 2015-12-07 DIAGNOSIS — G8929 Other chronic pain: Secondary | ICD-10-CM | POA: Diagnosis not present

## 2015-12-07 DIAGNOSIS — S93401D Sprain of unspecified ligament of right ankle, subsequent encounter: Secondary | ICD-10-CM

## 2015-12-07 DIAGNOSIS — R29818 Other symptoms and signs involving the nervous system: Secondary | ICD-10-CM

## 2015-12-07 DIAGNOSIS — L659 Nonscarring hair loss, unspecified: Secondary | ICD-10-CM | POA: Diagnosis not present

## 2015-12-07 DIAGNOSIS — L219 Seborrheic dermatitis, unspecified: Secondary | ICD-10-CM

## 2015-12-07 DIAGNOSIS — I10 Essential (primary) hypertension: Secondary | ICD-10-CM | POA: Diagnosis not present

## 2015-12-07 DIAGNOSIS — R079 Chest pain, unspecified: Secondary | ICD-10-CM | POA: Diagnosis not present

## 2015-12-07 DIAGNOSIS — R296 Repeated falls: Secondary | ICD-10-CM | POA: Diagnosis not present

## 2015-12-07 DIAGNOSIS — L218 Other seborrheic dermatitis: Secondary | ICD-10-CM | POA: Diagnosis not present

## 2015-12-07 DIAGNOSIS — R2689 Other abnormalities of gait and mobility: Secondary | ICD-10-CM

## 2015-12-07 LAB — CBC WITH DIFFERENTIAL/PLATELET
BASOS ABS: 0 10*3/uL (ref 0.0–0.1)
Basophils Relative: 1 % (ref 0–1)
EOS ABS: 0.1 10*3/uL (ref 0.0–0.7)
EOS PCT: 2 % (ref 0–5)
HCT: 44.5 % (ref 36.0–46.0)
Hemoglobin: 14.8 g/dL (ref 12.0–15.0)
LYMPHS ABS: 1.5 10*3/uL (ref 0.7–4.0)
LYMPHS PCT: 31 % (ref 12–46)
MCH: 31.6 pg (ref 26.0–34.0)
MCHC: 33.3 g/dL (ref 30.0–36.0)
MCV: 94.9 fL (ref 78.0–100.0)
MPV: 11 fL (ref 8.6–12.4)
Monocytes Absolute: 0.3 10*3/uL (ref 0.1–1.0)
Monocytes Relative: 6 % (ref 3–12)
NEUTROS PCT: 60 % (ref 43–77)
Neutro Abs: 2.9 10*3/uL (ref 1.7–7.7)
PLATELETS: 440 10*3/uL — AB (ref 150–400)
RBC: 4.69 MIL/uL (ref 3.87–5.11)
RDW: 13.3 % (ref 11.5–15.5)
WBC: 4.8 10*3/uL (ref 4.0–10.5)

## 2015-12-07 LAB — BASIC METABOLIC PANEL
BUN: 12 mg/dL (ref 7–25)
CHLORIDE: 104 mmol/L (ref 98–110)
CO2: 25 mmol/L (ref 20–31)
CREATININE: 0.7 mg/dL (ref 0.50–1.10)
Calcium: 9.2 mg/dL (ref 8.6–10.2)
Glucose, Bld: 125 mg/dL — ABNORMAL HIGH (ref 70–99)
POTASSIUM: 4.5 mmol/L (ref 3.5–5.3)
Sodium: 138 mmol/L (ref 135–146)

## 2015-12-07 LAB — TSH: TSH: 0.833 u[IU]/mL (ref 0.350–4.500)

## 2015-12-07 MED ORDER — MELOXICAM 7.5 MG PO TABS: 7.5000 mg | ORAL_TABLET | Freq: Every day | ORAL | Status: DC

## 2015-12-07 MED ORDER — KETOCONAZOLE 2 % EX SHAM: 1.0000 "application " | MEDICATED_SHAMPOO | CUTANEOUS | Status: DC

## 2015-12-07 NOTE — Assessment & Plan Note (Signed)
Well controlled, but recurrent episodes of chest pain, cardiology to be set up, she has risk factors for CAD

## 2015-12-07 NOTE — Progress Notes (Signed)
Patient ID: Kayla Choi, female   DOB: 1966/07/29, 49 y.o.   MRN: [id number]    Subjective:    Patient ID: Kayla Choi, female    DOB: 24-Oct-1966, 49 y.o.   MRN: [id number]  Patient presents for F/U; R Sided Pain; and Vaginal Dryness Pt here with multiple concerns. She states that she continues to have falls out of nowhere. She is fallen again down her steps which resulted in an ankle sprain she was seen at the urgent care on December 22. This is approximately 4 fall that she has had the past few months. Often ending up in the emergency room with some other type of injury. She states that now she sees stars or spots right before she goes down. She does not have any headaches associated. Initially all her episodes as when she was walking on the status post states and now even she she is on flat surfaces that she is fallen. No syncope, no witnessed seizure  She also continues to have episodes of chest pain though mostly relieved with her nerve medication. She did not see cardiology yet.   She noticed that her hair has been thinning and falling out over the past couple months. She states that the hair will come out in chunks. She also has a very itchy scalp and has scabs on her scalp.  She also noted that she has vaginal dryness. She tried to be intimate with her boyfriend this past weekend and was very dry. She has not tried any topical lubricants which I suggested.    Review Of Systems: per above   GEN- denies fatigue, fever, weight loss,weakness, recent illness HEENT- denies eye drainage, change in vision, nasal discharge, CVS- denies chest pain, palpitations RESP- denies SOB, cough, wheeze ABD- denies N/V, change in stools, abd pain GU- denies dysuria, hematuria, dribbling, incontinence MSK- denies joint pain, muscle aches, injury Neuro- denies headache, dizziness, syncope, seizure activity       Objective:    BP 136/70 mmHg  Pulse 78  Temp(Src) 97.8 F (36.6 C) (Oral)  Resp  16  Ht 5\' 1"  (1.549 m)  Wt 163 lb (73.936 kg)  BMI 30.81 kg/m2 GEN- NAD, alert and oriented x3 HEENT- PERRL, EOMI, non injected sclera, pink conjunctiva, MMM, oropharynx clear Neck- Supple, no thyromegaly, no bruit CVS- RRR, no murmur RESP-CTAB Skin- flaky skin, erythema with small yellow scabs, no drainage, no pustules Neuro-CNII-XII intact no deficits EXT- No edema Pulses- Radia  2+        Assessment & Plan:      Problem List Items Addressed This Visit    Essential hypertension - Primary    Well controlled, but recurrent episodes of chest pain, cardiology to be set up, she has risk factors for CAD      Relevant Orders   CBC with Differential/Platelet   Basic metabolic panel   TSH    Other Visit Diagnoses    Hair loss        Relevant Orders    TSH    Seborrheic dermatitis of scalp        Nizoral shampoo, check TSH, Hb for the hair thinning as well    Sprain of ankle, right, subsequent encounter        ACE wrap in office, unable to afford air cast, MOBic given    Recurrent falls        Unclear cause but now with Aura before episodes, ? CNS pathology, obtain MRI, if neg may need EEG  Relevant Orders    MR Brain Wo Contrast    Balance problem        Relevant Orders    MR Brain Wo Contrast    Recurrent chest pain        Relevant Medications    meloxicam (MOBIC) 7.5 MG tablet    Other Relevant Orders    Ambulatory referral to Cardiology       Note: This dictation was prepared with Dragon dictation along with smaller phrase technology. Any transcriptional errors that result from this process are unintentional.

## 2015-12-07 NOTE — Patient Instructions (Addendum)
MRI of brain for recurrent falls Use ACE wrap, ICE and elevate - Meloxicam  Use lubricants over the county  Use the Nizoral on your scalp  Referral to cardiology Get Hair and nail vitamin  F/U 3 months

## 2015-12-09 DIAGNOSIS — IMO0001 Reserved for inherently not codable concepts without codable children: Secondary | ICD-10-CM

## 2015-12-09 HISTORY — DX: Reserved for inherently not codable concepts without codable children: IMO0001

## 2015-12-11 ENCOUNTER — Encounter: Payer: Self-pay | Admitting: *Deleted
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
## 2015-12-13 ENCOUNTER — Encounter: Payer: Self-pay | Admitting: Cardiovascular Disease

## 2015-12-13 ENCOUNTER — Ambulatory Visit (INDEPENDENT_AMBULATORY_CARE_PROVIDER_SITE_OTHER): Payer: Medicaid Other | Admitting: Cardiovascular Disease

## 2015-12-13 ENCOUNTER — Encounter: Payer: Self-pay | Admitting: *Deleted

## 2015-12-13 VITALS — BP 140/90 | HR 91 | Ht 63.0 in | Wt 164.0 lb

## 2015-12-13 DIAGNOSIS — R072 Precordial pain: Secondary | ICD-10-CM

## 2015-12-13 NOTE — Patient Instructions (Signed)
Your physician recommends that you schedule a follow-up appointment As Needed with Dr. Johnsie Cancel  Your physician recommends that you continue on your current medications as directed. Please refer to the Current Medication list given to you today.  Your physician has requested that you have en exercise stress myoview. For further information please visit [email or web address]. Please follow instruction sheet, as given.  Your physician has requested that you have an echocardiogram. Echocardiography is a painless test that uses sound waves to create images of your heart. It provides your doctor with information about the size and shape of your heart and how well your heart's chambers and valves are working. This procedure takes approximately one hour. There are no restrictions for this procedure.  If you need a refill on your cardiac medications before your next appointment, please call your pharmacy.  Thank you for choosing [facility]!

## 2015-12-19 ENCOUNTER — Encounter (HOSPITAL_COMMUNITY)
Admission: RE | Admit: 2015-12-19 | Discharge: 2015-12-19 | Disposition: A | Discharge status: Home / Self Care | Payer: Medicaid Other | Source: Ambulatory Visit | Attending: Cardiovascular Disease | Admitting: Cardiovascular Disease

## 2015-12-19 ENCOUNTER — Ambulatory Visit (HOSPITAL_COMMUNITY)
Admission: RE | Admit: 2015-12-19 | Discharge: 2015-12-19 | Disposition: A | Discharge status: Home / Self Care | Payer: Medicaid Other | Source: Ambulatory Visit | Attending: Cardiovascular Disease | Admitting: Cardiovascular Disease

## 2015-12-19 ENCOUNTER — Other Ambulatory Visit (HOSPITAL_COMMUNITY): Payer: Self-pay

## 2015-12-19 ENCOUNTER — Inpatient Hospital Stay (HOSPITAL_COMMUNITY): Admission: RE | Admit: 2015-12-19 | Payer: Self-pay | Source: Ambulatory Visit

## 2015-12-19 ENCOUNTER — Encounter (HOSPITAL_COMMUNITY): Payer: Self-pay

## 2015-12-19 DIAGNOSIS — R079 Chest pain, unspecified: Secondary | ICD-10-CM | POA: Diagnosis not present

## 2015-12-19 DIAGNOSIS — R072 Precordial pain: Secondary | ICD-10-CM | POA: Diagnosis not present

## 2015-12-19 DIAGNOSIS — I10 Essential (primary) hypertension: Secondary | ICD-10-CM | POA: Diagnosis not present

## 2015-12-19 DIAGNOSIS — I081 Rheumatic disorders of both mitral and tricuspid valves: Secondary | ICD-10-CM | POA: Insufficient documentation

## 2015-12-19 DIAGNOSIS — E785 Hyperlipidemia, unspecified: Secondary | ICD-10-CM | POA: Insufficient documentation

## 2015-12-19 DIAGNOSIS — J449 Chronic obstructive pulmonary disease, unspecified: Secondary | ICD-10-CM | POA: Diagnosis not present

## 2015-12-19 DIAGNOSIS — I371 Nonrheumatic pulmonary valve insufficiency: Secondary | ICD-10-CM | POA: Diagnosis not present

## 2015-12-19 DIAGNOSIS — E119 Type 2 diabetes mellitus without complications: Secondary | ICD-10-CM | POA: Diagnosis not present

## 2015-12-19 HISTORY — DX: Unspecified asthma, uncomplicated: J45.909

## 2015-12-19 LAB — NM MYOCAR MULTI W/SPECT W/WALL MOTION / EF
CHL CUP NUCLEAR SRS: 0
CHL CUP NUCLEAR SSS: 2
CSEPED: 6 min
Estimated workload: 7 METS
Exercise duration (sec): 0 s
LV dias vol: 44 mL
LVSYSVOL: 18 mL
MPHR: 170 {beats}/min
Peak HR: 164 {beats}/min
Percent HR: 96 %
RATE: 0
RPE: 13
Rest HR: 71 {beats}/min
SDS: 2
TID: 0.99

## 2015-12-19 MED ORDER — REGADENOSON 0.4 MG/5ML IV SOLN
INTRAVENOUS | Status: AC
  Filled 2015-12-19: qty 5

## 2015-12-19 MED ORDER — SODIUM CHLORIDE 0.9 % IJ SOLN
INTRAMUSCULAR | Status: AC
Start: 2015-12-19 — End: 2015-12-19
  Administered 2015-12-19: 10 mL via INTRAVENOUS
  Filled 2015-12-19: qty 3

## 2015-12-19 MED ORDER — TECHNETIUM TC 99M SESTAMIBI - CARDIOLITE
10.0000 | Freq: Once | INTRAVENOUS | Status: AC | PRN
  Administered 2015-12-19: 10.4 via INTRAVENOUS

## 2015-12-19 MED ORDER — TECHNETIUM TC 99M SESTAMIBI GENERIC - CARDIOLITE
30.0000 | Freq: Once | INTRAVENOUS | Status: AC | PRN
  Administered 2015-12-19: 29.3 via INTRAVENOUS

## 2015-12-26 ENCOUNTER — Ambulatory Visit (HOSPITAL_COMMUNITY): Payer: Medicaid Other

## 2016-01-28 ENCOUNTER — Emergency Department (HOSPITAL_COMMUNITY)
Admission: EM | Admit: 2016-01-28 | Discharge: 2016-01-28 | Disposition: A | Discharge status: Home / Self Care | Payer: Medicaid Other | Attending: Emergency Medicine | Admitting: Emergency Medicine

## 2016-01-28 ENCOUNTER — Encounter (HOSPITAL_COMMUNITY): Payer: Self-pay | Admitting: *Deleted

## 2016-01-28 DIAGNOSIS — E119 Type 2 diabetes mellitus without complications: Secondary | ICD-10-CM | POA: Insufficient documentation

## 2016-01-28 DIAGNOSIS — J449 Chronic obstructive pulmonary disease, unspecified: Secondary | ICD-10-CM | POA: Insufficient documentation

## 2016-01-28 DIAGNOSIS — R51 Headache: Secondary | ICD-10-CM | POA: Insufficient documentation

## 2016-01-28 DIAGNOSIS — I10 Essential (primary) hypertension: Secondary | ICD-10-CM | POA: Insufficient documentation

## 2016-01-28 NOTE — ED Notes (Signed)
Called no answer
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
## 2016-02-29 ENCOUNTER — Other Ambulatory Visit: Payer: Self-pay | Admitting: Family Medicine

## 2016-02-29 NOTE — Telephone Encounter (Signed)
Refill appropriate and filled per protocol. 

## 2016-03-04 ENCOUNTER — Other Ambulatory Visit: Payer: Self-pay | Admitting: Family Medicine

## 2016-03-04 MED ORDER — SITAGLIPTIN PHOSPHATE 100 MG PO TABS: 100.0000 mg | ORAL_TABLET | Freq: Every day | ORAL | Status: DC

## 2016-03-04 NOTE — Telephone Encounter (Signed)
Medication refilled per protocol. 

## 2016-03-05 ENCOUNTER — Encounter (HOSPITAL_COMMUNITY): Payer: Self-pay | Admitting: Emergency Medicine

## 2016-03-05 ENCOUNTER — Telehealth: Payer: Self-pay | Admitting: *Deleted

## 2016-03-05 ENCOUNTER — Emergency Department (HOSPITAL_COMMUNITY)
Admission: EM | Admit: 2016-03-05 | Discharge: 2016-03-05 | Disposition: A | Discharge status: Home / Self Care | Payer: Medicaid Other | Attending: Emergency Medicine | Admitting: Emergency Medicine

## 2016-03-05 ENCOUNTER — Emergency Department (HOSPITAL_COMMUNITY): Payer: Medicaid Other

## 2016-03-05 DIAGNOSIS — J45909 Unspecified asthma, uncomplicated: Secondary | ICD-10-CM | POA: Insufficient documentation

## 2016-03-05 DIAGNOSIS — F419 Anxiety disorder, unspecified: Secondary | ICD-10-CM | POA: Insufficient documentation

## 2016-03-05 DIAGNOSIS — J449 Chronic obstructive pulmonary disease, unspecified: Secondary | ICD-10-CM | POA: Diagnosis not present

## 2016-03-05 DIAGNOSIS — Z794 Long term (current) use of insulin: Secondary | ICD-10-CM | POA: Insufficient documentation

## 2016-03-05 DIAGNOSIS — E119 Type 2 diabetes mellitus without complications: Secondary | ICD-10-CM | POA: Insufficient documentation

## 2016-03-05 DIAGNOSIS — F329 Major depressive disorder, single episode, unspecified: Secondary | ICD-10-CM | POA: Diagnosis not present

## 2016-03-05 DIAGNOSIS — Z79899 Other long term (current) drug therapy: Secondary | ICD-10-CM | POA: Diagnosis not present

## 2016-03-05 DIAGNOSIS — F410 Panic disorder [episodic paroxysmal anxiety] without agoraphobia: Secondary | ICD-10-CM | POA: Diagnosis not present

## 2016-03-05 DIAGNOSIS — I10 Essential (primary) hypertension: Secondary | ICD-10-CM | POA: Insufficient documentation

## 2016-03-05 DIAGNOSIS — R079 Chest pain, unspecified: Secondary | ICD-10-CM | POA: Diagnosis present

## 2016-03-05 DIAGNOSIS — Z7984 Long term (current) use of oral hypoglycemic drugs: Secondary | ICD-10-CM | POA: Diagnosis not present

## 2016-03-05 DIAGNOSIS — E785 Hyperlipidemia, unspecified: Secondary | ICD-10-CM | POA: Insufficient documentation

## 2016-03-05 HISTORY — DX: Panic disorder (episodic paroxysmal anxiety): F41.0

## 2016-03-05 HISTORY — DX: Reserved for inherently not codable concepts without codable children: IMO0001

## 2016-03-05 HISTORY — DX: Chest pain, unspecified: R07.9

## 2016-03-05 LAB — BASIC METABOLIC PANEL
Anion gap: 12 (ref 5–15)
BUN: 12 mg/dL (ref 6–20)
CO2: 24 mmol/L (ref 22–32)
Calcium: 9.1 mg/dL (ref 8.9–10.3)
Chloride: 102 mmol/L (ref 101–111)
Creatinine, Ser: 1.04 mg/dL — ABNORMAL HIGH (ref 0.44–1.00)
GFR calc Af Amer: >60 mL/min (ref >60)
GFR calc non Af Amer: >60 mL/min (ref >60)
GLUCOSE: 147 mg/dL — AB (ref 65–99)
POTASSIUM: 3.7 mmol/L (ref 3.5–5.1)
SODIUM: 138 mmol/L (ref 135–145)

## 2016-03-05 LAB — CBC
HEMATOCRIT: 42.9 % (ref 36.0–46.0)
HEMOGLOBIN: 14.5 g/dL (ref 12.0–15.0)
MCH: 32 pg (ref 26.0–34.0)
MCHC: 33.8 g/dL (ref 30.0–36.0)
MCV: 94.7 fL (ref 78.0–100.0)
Platelets: 390 10*3/uL (ref 150–400)
RBC: 4.53 MIL/uL (ref 3.87–5.11)
RDW: 12.8 % (ref 11.5–15.5)
WBC: 6.8 10*3/uL (ref 4.0–10.5)

## 2016-03-05 LAB — TROPONIN I: Troponin I: <0.03 ng/mL (ref <0.031)

## 2016-03-05 MED ORDER — LORAZEPAM 2 MG/ML IJ SOLN
1.0000 mg | Freq: Once | INTRAMUSCULAR | Status: AC
  Administered 2016-03-05: 1 mg via INTRAVENOUS
  Filled 2016-03-05: qty 1

## 2016-03-05 MED ORDER — SODIUM CHLORIDE 0.9 % IV BOLUS (SEPSIS)
1000.0000 mL | Freq: Once | INTRAVENOUS | Status: AC
  Administered 2016-03-05: 1000 mL via INTRAVENOUS

## 2016-03-05 NOTE — Telephone Encounter (Signed)
Received request from pharmacy for PA on Kayla Choi.   Patient has tried and failed therapy with MTF.   PA submitted.   Dx: E11.9

## 2016-03-05 NOTE — ED Notes (Signed)
Pt ambulated from ED.  Pt states understanding of care given and follow up instructions
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
## 2016-03-05 NOTE — Discharge Instructions (Signed)
Take your usual prescriptions as previously directed.  Call your regular medical doctor and your mental health provider tomorrow morning to schedule a follow up appointment within the week.  Return to the Emergency Department immediately sooner if worsening.
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
## 2016-03-06 NOTE — Telephone Encounter (Signed)
Received PA determination.   PA approved 03/05/2016- 03/05/2017.  PA # [id number].   Pharmacy made aware.

## 2016-03-10 ENCOUNTER — Encounter: Payer: Self-pay | Admitting: Family Medicine

## 2016-03-10 ENCOUNTER — Ambulatory Visit (INDEPENDENT_AMBULATORY_CARE_PROVIDER_SITE_OTHER): Payer: Medicaid Other | Admitting: Family Medicine

## 2016-03-10 ENCOUNTER — Ambulatory Visit: Payer: Medicaid Other | Admitting: Family Medicine

## 2016-03-10 VITALS — BP 140/72 | HR 78 | Temp 97.8°F | Resp 12 | Ht 63.0 in | Wt 155.0 lb

## 2016-03-10 DIAGNOSIS — L720 Epidermal cyst: Secondary | ICD-10-CM

## 2016-03-10 DIAGNOSIS — L723 Sebaceous cyst: Secondary | ICD-10-CM | POA: Insufficient documentation

## 2016-03-10 DIAGNOSIS — F411 Generalized anxiety disorder: Secondary | ICD-10-CM | POA: Diagnosis not present

## 2016-03-10 DIAGNOSIS — I10 Essential (primary) hypertension: Secondary | ICD-10-CM

## 2016-03-10 DIAGNOSIS — E119 Type 2 diabetes mellitus without complications: Secondary | ICD-10-CM

## 2016-03-10 DIAGNOSIS — E785 Hyperlipidemia, unspecified: Secondary | ICD-10-CM

## 2016-03-10 LAB — LIPID PANEL
CHOL/HDL RATIO: 3.3 ratio (ref <=5.0)
CHOLESTEROL: 183 mg/dL (ref 125–200)
HDL: 55 mg/dL (ref >=46)
LDL Cholesterol: 109 mg/dL (ref <130)
Triglycerides: 94 mg/dL (ref <150)
VLDL: 19 mg/dL (ref <30)

## 2016-03-10 LAB — COMPREHENSIVE METABOLIC PANEL
ALT: 16 U/L (ref 6–29)
AST: 15 U/L (ref 10–35)
Albumin: 3.9 g/dL (ref 3.6–5.1)
Alkaline Phosphatase: 34 U/L (ref 33–130)
BUN: 12 mg/dL (ref 7–25)
CALCIUM: 9.1 mg/dL (ref 8.6–10.4)
CHLORIDE: 104 mmol/L (ref 98–110)
CO2: 28 mmol/L (ref 20–31)
Creat: 0.79 mg/dL (ref 0.50–1.05)
GLUCOSE: 123 mg/dL — AB (ref 70–99)
POTASSIUM: 4.1 mmol/L (ref 3.5–5.3)
Sodium: 140 mmol/L (ref 135–146)
Total Bilirubin: 1.1 mg/dL (ref 0.2–1.2)
Total Protein: 6.5 g/dL (ref 6.1–8.1)

## 2016-03-10 LAB — HEMOGLOBIN A1C
HEMOGLOBIN A1C: 6.1 % — AB (ref <5.7)
Mean Plasma Glucose: 128 mg/dL

## 2016-03-10 NOTE — Assessment & Plan Note (Signed)
She has appt with dermatology will see if they address for removal
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
## 2016-03-10 NOTE — Patient Instructions (Addendum)
F/U 4 months  Check with dermatologist for cyst in axilla
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
## 2016-03-10 NOTE — Assessment & Plan Note (Signed)
Well controlled 

## 2016-03-10 NOTE — Assessment & Plan Note (Signed)
Well controlled, recheck A1C

## 2016-04-01 ENCOUNTER — Telehealth: Payer: Self-pay | Admitting: Family Medicine

## 2016-04-01 NOTE — Telephone Encounter (Signed)
Patient fell down a flight of stairs and says she is in excruciating pain, would like to talk to you about this, I told her to go to er or urgent care,but she insisted on speaking with you   [phone]

## 2016-04-01 NOTE — Telephone Encounter (Signed)
Call placed to patient.   Reports that she fell down laundry steps and now has hard knot to back of L knee with severe pain.   Advised to go to ER.

## 2016-04-10 ENCOUNTER — Emergency Department (HOSPITAL_COMMUNITY)
Admission: EM | Admit: 2016-04-10 | Discharge: 2016-04-10 | Disposition: A | Discharge status: Home / Self Care | Payer: Medicaid Other | Attending: Emergency Medicine | Admitting: Emergency Medicine

## 2016-04-10 ENCOUNTER — Encounter (HOSPITAL_COMMUNITY): Payer: Self-pay | Admitting: Emergency Medicine

## 2016-04-10 ENCOUNTER — Emergency Department (HOSPITAL_COMMUNITY): Payer: Medicaid Other

## 2016-04-10 ENCOUNTER — Emergency Department (HOSPITAL_COMMUNITY)
Admission: EM | Admit: 2016-04-10 | Discharge: 2016-04-10 | Discharge status: Against Medical Advice | Payer: Medicaid Other | Source: Home / Self Care | Attending: Emergency Medicine | Admitting: Emergency Medicine

## 2016-04-10 DIAGNOSIS — Z79899 Other long term (current) drug therapy: Secondary | ICD-10-CM | POA: Diagnosis not present

## 2016-04-10 DIAGNOSIS — M25561 Pain in right knee: Secondary | ICD-10-CM | POA: Insufficient documentation

## 2016-04-10 DIAGNOSIS — Y939 Activity, unspecified: Secondary | ICD-10-CM | POA: Insufficient documentation

## 2016-04-10 DIAGNOSIS — M549 Dorsalgia, unspecified: Secondary | ICD-10-CM | POA: Insufficient documentation

## 2016-04-10 DIAGNOSIS — I10 Essential (primary) hypertension: Secondary | ICD-10-CM

## 2016-04-10 DIAGNOSIS — S8001XA Contusion of right knee, initial encounter: Secondary | ICD-10-CM | POA: Insufficient documentation

## 2016-04-10 DIAGNOSIS — W108XXA Fall (on) (from) other stairs and steps, initial encounter: Secondary | ICD-10-CM | POA: Insufficient documentation

## 2016-04-10 DIAGNOSIS — S39012A Strain of muscle, fascia and tendon of lower back, initial encounter: Secondary | ICD-10-CM | POA: Diagnosis not present

## 2016-04-10 DIAGNOSIS — E785 Hyperlipidemia, unspecified: Secondary | ICD-10-CM | POA: Insufficient documentation

## 2016-04-10 DIAGNOSIS — Z91040 Latex allergy status: Secondary | ICD-10-CM | POA: Insufficient documentation

## 2016-04-10 DIAGNOSIS — J449 Chronic obstructive pulmonary disease, unspecified: Secondary | ICD-10-CM | POA: Insufficient documentation

## 2016-04-10 DIAGNOSIS — E119 Type 2 diabetes mellitus without complications: Secondary | ICD-10-CM

## 2016-04-10 DIAGNOSIS — J45909 Unspecified asthma, uncomplicated: Secondary | ICD-10-CM

## 2016-04-10 DIAGNOSIS — W19XXXA Unspecified fall, initial encounter: Secondary | ICD-10-CM

## 2016-04-10 DIAGNOSIS — F329 Major depressive disorder, single episode, unspecified: Secondary | ICD-10-CM | POA: Insufficient documentation

## 2016-04-10 DIAGNOSIS — S29012A Strain of muscle and tendon of back wall of thorax, initial encounter: Secondary | ICD-10-CM | POA: Insufficient documentation

## 2016-04-10 DIAGNOSIS — Y9289 Other specified places as the place of occurrence of the external cause: Secondary | ICD-10-CM | POA: Diagnosis not present

## 2016-04-10 DIAGNOSIS — Y999 Unspecified external cause status: Secondary | ICD-10-CM | POA: Diagnosis not present

## 2016-04-10 DIAGNOSIS — Z5321 Procedure and treatment not carried out due to patient leaving prior to being seen by health care provider: Secondary | ICD-10-CM

## 2016-04-10 DIAGNOSIS — S8991XA Unspecified injury of right lower leg, initial encounter: Secondary | ICD-10-CM | POA: Diagnosis present

## 2016-04-10 DIAGNOSIS — S29019A Strain of muscle and tendon of unspecified wall of thorax, initial encounter: Secondary | ICD-10-CM

## 2016-04-10 NOTE — ED Provider Notes (Signed)
CSN: [id number]     Arrival date & time 04/10/16  2208 History   First MD Initiated Contact with Patient 04/10/16 2222     Chief Complaint  Patient presents with  . Fall     (Consider location/radiation/quality/duration/timing/severity/associated sxs/prior Treatment) Patient is a 50 y.o. female presenting with fall. The history is provided by the patient.  Fall This is a new problem. The current episode started today. The problem occurs constantly. The problem has been unchanged.   Kayla Choi is a 50 y.o. female who presents to the ED with back pain and right knee pain that started this morning about 8:30 after she fell. She reports that she was going up the steps in the laundry room and tripped on the step and fell. There are only 3 steps and on the way back down she tripped and fell again. Patient denies head injury or LOC.  Past Medical History  Diagnosis Date  . Hypertension   . COPD (chronic obstructive pulmonary disease) ([place])   . Hyperlipidemia   . Prediabetes   . Anxiety     with social phobia  . Anemia     iron deficinecy  . Depression   . ADD (attention deficit disorder)   . Glaucoma   . Diabetes mellitus   . Social phobia   . Acid reflux   . Asthma   . Chest pain     that occurs with anxiety  . Panic attack   . Normal cardiac stress test 12/2015    low risk study   Past Surgical History  Procedure Laterality Date  . Cholecystectomy    . Right elbow      reconstruction  . Abdominal hysterectomy      fibroids, both ovaries left intact  . Cosmetic surgery      elbow  . Lipoma excision  04/30/2012    Procedure: EXCISION LIPOMA;  Surgeon: Donato Heinz, MD;  Location: [facility];  Service: General;  Laterality: Right;  Excision soft tissue mass right thigh  . Fracture surgery      Recurrent elbow surgery   Family History  Problem Relation Age of Onset  . Hypertension Mother   . Diabetes Father   . Heart disease Father   . Anesthesia problems Neg Hx   .  Malignant hyperthermia Neg Hx   . Hypotension Neg Hx   . Pseudochol deficiency Neg Hx    Social History  Substance Use Topics  . Smoking status: Never Smoker   . Smokeless tobacco: Never Used  . Alcohol Use: No   OB History    Gravida Para Term Preterm AB TAB SAB Ectopic Multiple Living   2 2 2       2      Review of Systems Negative except as stated in HPI   Allergies  Aspirin; Metformin and related; Other; Latex; Neosporin; and Oxycodone  Home Medications   Prior to Admission medications   Medication Sig Start Date End Date Taking? Authorizing Provider  albuterol (PROVENTIL HFA;VENTOLIN HFA) 108 (90 BASE) MCG/ACT inhaler Inhale 2 puffs into the lungs every 6 (six) hours as needed for wheezing or shortness of breath. 07/20/13  Yes Alycia Rossetti, MD  albuterol (PROVENTIL) (2.5 MG/3ML) 0.083% nebulizer solution Take 3 mLs (2.5 mg total) by nebulization every 4 (four) hours as needed for wheezing or shortness of breath. 10/06/14  Yes Alycia Rossetti, MD  amLODipine (NORVASC) 5 MG tablet TAKE 1 TABLET BY MOUTH ONCE DAILY. 02/29/16  Yes Alycia Rossetti, MD  clonazePAM (KLONOPIN) 0.5 MG tablet TAKE 1 TABLET BY MOUTH TWICE DAILY AS NEEDED FOR ANXIETY. 04/27/15  Yes Alycia Rossetti, MD  CRESTOR 20 MG tablet TAKE ONE TABLET BY MOUTH ONCE DAILY. 10/24/15  Yes Alycia Rossetti, MD  FLUoxetine (PROZAC) 10 MG capsule Take 10 mg by mouth daily.   Yes Historical Provider, MD  lisinopril-hydrochlorothiazide (PRINZIDE,ZESTORETIC) 20-12.5 MG tablet TAKE 2 TABLETS BY MOUTH DAILY. 02/29/16  Yes Alycia Rossetti, MD  sitaGLIPtin (JANUVIA) 100 MG tablet Take 1 tablet (100 mg total) by mouth daily. 03/04/16  Yes Alycia Rossetti, MD  topiramate (TOPAMAX) 25 MG tablet Take 25 mg by mouth at bedtime. As needed   Yes Historical Provider, MD  ketoconazole (NIZORAL) 2 % shampoo Apply 1 application topically 2 (two) times a week. 12/07/15   Alycia Rossetti, MD  meloxicam (MOBIC) 7.5 MG tablet Take 1 tablet  (7.5 mg total) by mouth daily. Patient taking differently: Take 7.5 mg by mouth daily as needed for pain.  12/07/15   Alycia Rossetti, MD  tiotropium (SPIRIVA) 18 MCG inhalation capsule Place 1 capsule (18 mcg total) into inhaler and inhale daily. 07/20/13   Alycia Rossetti, MD  traMADol (ULTRAM) 50 MG tablet TAKE (1) TABLET BY MOUTH EVERY (8) HOURS AS NEEDED FOR PAIN. 04/27/15   Alycia Rossetti, MD   BP 160/86 mmHg  Pulse 90  Temp(Src) 98.2 F (36.8 C) (Oral)  Resp 18  SpO2 100% Physical Exam  Constitutional: She is oriented to person, place, and time. She appears well-developed and well-nourished. No distress.  HENT:  Head: Normocephalic and atraumatic.  Right Ear: Tympanic membrane normal.  Left Ear: Tympanic membrane normal.  Nose: Nose normal.  Mouth/Throat: Uvula is midline, oropharynx is clear and moist and mucous membranes are normal.  Eyes: EOM are normal.  Neck: Normal range of motion. Neck supple. No spinous process tenderness and no muscular tenderness present.  Cardiovascular: Normal rate and regular rhythm.   Pulmonary/Chest: Effort normal. She has no wheezes. She has no rales.  Abdominal: Soft. Bowel sounds are normal. There is no tenderness.  Musculoskeletal: Normal range of motion.       Right knee: She exhibits no ecchymosis, no deformity, no laceration, no erythema and normal alignment. Decreased range of motion: due to pain. Swelling: minimal. Tenderness found.       Thoracic back: She exhibits tenderness. She exhibits normal range of motion, no deformity, no laceration, no spasm and normal pulse.       Back:       Legs: Pedal pulses 2+, adequate circulation, good touch sensation.   Neurological: She is alert and oriented to person, place, and time. She has normal strength. No cranial nerve deficit or sensory deficit.  Reflex Scores:      Bicep reflexes are 2+ on the right side and 2+ on the left side.      Brachioradialis reflexes are 2+ on the right side and  2+ on the left side.      Patellar reflexes are 2+ on the right side and 2+ on the left side.      Achilles reflexes are 2+ on the right side and 2+ on the left side. Patient ambulatory with a limp due to pain in the right knee.   Skin: Skin is warm and dry.  Psychiatric: She has a normal mood and affect. Her behavior is normal.  Nursing note and vitals reviewed.   ED  Course  Procedures (including critical care time) X-rays, ace wrap to right knee, ice  Labs Review Labs Reviewed - No data to display   MDM  50 y.o. female with right knee and thoracic pain s/p fall earlier today stable for d/c without abnormal findings on x-ray and no focal neuro deficits.  Patient ambulatory on d/c. She will follow up with her PCP or return here as needed for worsening symptoms. Discussed with the patient and all questioned fully answered.    Final diagnoses:  Contusion of right knee, initial encounter  Thoracic myofascial strain, initial encounter        [facility], NP 04/10/16 [place], MD 04/11/16 [phone]

## 2016-04-10 NOTE — ED Provider Notes (Cosign Needed)
Kayla Choi is a 50 y.o. female who presents to the ED with back pain s/p fall earlier today.   BP 165/95 mmHg  Pulse 88  Temp(Src) 98.2 F (36.8 C) (Oral)  Resp 18  Ht 5\' 1"  (1.549 m)  Wt 70.308 kg  BMI 29.30 kg/m2  SpO2 100%   Went to exam room to see the patient and she had left. RN reports that the patient states she had to leave to take her daughter to work.   Ashley [place], [place] 04/10/16 2023

## 2016-04-10 NOTE — ED Notes (Signed)
Pt fell on stairs this morning, hurting in back and right knee, was here a couple hours ago but left prior to completion of treatment

## 2016-04-10 NOTE — ED Notes (Signed)
Pt states understanding of care given and follow up instructions 

## 2016-04-10 NOTE — ED Notes (Signed)
Fell on stairs going to laundry room this morning and when she stood up fell again, having pain in her back and right knee

## 2016-04-10 NOTE — Discharge Instructions (Signed)
Take tylenol as needed for pain. Follow up with your doctor or return here as needed for worsening symptoms

## 2016-04-10 NOTE — ED Notes (Signed)
Patient came from room and stated "I need to leave, I have to take my daughter to work. She has to be there in a minute." Patient left prior to being seen by FNP.

## 2016-05-13 ENCOUNTER — Ambulatory Visit (INDEPENDENT_AMBULATORY_CARE_PROVIDER_SITE_OTHER): Payer: Medicaid Other | Admitting: Family Medicine

## 2016-05-13 ENCOUNTER — Encounter: Payer: Self-pay | Admitting: Family Medicine

## 2016-05-13 VITALS — BP 140/82 | HR 78 | Temp 98.5°F | Resp 12 | Ht 61.0 in | Wt 149.0 lb

## 2016-05-13 DIAGNOSIS — Z113 Encounter for screening for infections with a predominantly sexual mode of transmission: Secondary | ICD-10-CM

## 2016-05-13 DIAGNOSIS — R296 Repeated falls: Secondary | ICD-10-CM | POA: Insufficient documentation

## 2016-05-13 DIAGNOSIS — R2689 Other abnormalities of gait and mobility: Secondary | ICD-10-CM

## 2016-05-13 DIAGNOSIS — R29818 Other symptoms and signs involving the nervous system: Secondary | ICD-10-CM | POA: Diagnosis not present

## 2016-05-13 DIAGNOSIS — Z1159 Encounter for screening for other viral diseases: Secondary | ICD-10-CM

## 2016-05-13 LAB — WET PREP FOR TRICH, YEAST, CLUE
TRICH WET PREP: NONE SEEN
Yeast Wet Prep HPF POC: NONE SEEN

## 2016-05-13 NOTE — Progress Notes (Signed)
Patient ID: Kayla Choi, female   DOB: 25-Jun-1966, 50 y.o.   MRN: [id number]    Subjective:    Patient ID: Kayla Choi, female    DOB: 12-Apr-1966, 50 y.o.   MRN: [id number]  Patient presents for STD Check  Pt here for STD check, has new partner and wants to be checked, no symptoms  Continues to have falls, was in ED recently with fall, state she slipped down some stairs, only bruising. She continues to have some dizzy episodes and feels off balanced. Her son lives with her but typically she is alone. Missed MRI scheduled for Jan  She has stopped taking her prozac on a regular basis it makes her feels more anxious and jittery. [facility] recently put her on 10mg .    Review Of Systems:  GEN- denies fatigue, fever, weight loss,weakness, recent illness HEENT- denies eye drainage, change in vision, nasal discharge, CVS- denies chest pain, palpitations RESP- denies SOB, cough, wheeze ABD- denies N/V, change in stools, abd pain GU- denies dysuria, hematuria, dribbling, incontinence MSK- denies joint pain, muscle aches, injury Neuro- denies headache, dizziness, syncope, seizure activity       Objective:    BP 140/82 mmHg  Pulse 78  Temp(Src) 98.5 F (36.9 C) (Oral)  Resp 12  Ht 5\' 1"  (1.549 m)  Wt 149 lb (67.586 kg)  BMI 28.17 kg/m2 GEN- NAD, alert and oriented x3 HEENT- PERRL, EOMI, non injected sclera, pink conjunctiva, MMM, oropharynx clear CVS- RRR, no murmur RESP-CTAB GU- normal external genitalia, vaginal mucosa pink and moist, s/p hysterectomy  minimal thin clear discharge, no CMT, no ovarian masses,  Neuro-CNII-XII in tact, no focal deficits  Pulses- Radial  2+        Assessment & Plan:      Problem List Items Addressed This Visit    Recurrent falls    Recurrent falls, multiple ER visits, balance issues Needs MRI to rule out space occupying lesions or signs of MS Reschedule MRI       Other Visit Diagnoses    Screen for STD (sexually transmitted disease)     -  Primary    Relevant Orders    WET PREP FOR [place], YEAST, CLUE (Completed)    GC/Chlamydia Probe Amp (Completed)    RPR (Completed)    Screening for viral disease        Relevant Orders    HIV antibody (Completed)    HSV(herpes smplx)abs-1+2(IgG+IgM)-bld (Completed)       Note: This dictation was prepared with Dragon dictation along with smaller phrase technology. Any transcriptional errors that result from this process are unintentional.

## 2016-05-13 NOTE — Patient Instructions (Addendum)
MRI of brain to be done for falls and balance We will call with lab results Stop the prozac  F/U  2 months for diabetes

## 2016-05-14 ENCOUNTER — Telehealth: Payer: Self-pay | Admitting: *Deleted

## 2016-05-14 LAB — GC/CHLAMYDIA PROBE AMP
CT Probe RNA: NOT DETECTED
GC Probe RNA: NOT DETECTED

## 2016-05-14 LAB — HIV ANTIBODY (ROUTINE TESTING W REFLEX): HIV 1&2 Ab, 4th Generation: NONREACTIVE

## 2016-05-14 LAB — RPR

## 2016-05-14 MED ORDER — IBUPROFEN 800 MG PO TABS: 800.0000 mg | ORAL_TABLET | Freq: Three times a day (TID) | ORAL | Status: DC | PRN

## 2016-05-14 NOTE — Assessment & Plan Note (Signed)
Recurrent falls, multiple ER visits, balance issues Needs MRI to rule out space occupying lesions or signs of MS Reschedule MRI

## 2016-05-14 NOTE — Telephone Encounter (Signed)
Received call from patient.   Requested to have refill on IBU 800mg  for HA.   Ok to refill?

## 2016-05-14 NOTE — Telephone Encounter (Signed)
Prescription sent to pharmacy.

## 2016-05-14 NOTE — Telephone Encounter (Signed)
Okay to refill 30 tabs 

## 2016-05-15 LAB — HSV(HERPES SMPLX)ABS-I+II(IGG+IGM)-BLD
HSV 1 GLYCOPROTEIN G AB, IGG: 2.85 {index} — AB (ref <0.90)
Herpes Simplex Vrs I&II-IgM Ab (EIA): 0.49 INDEX

## 2016-05-22 ENCOUNTER — Emergency Department (HOSPITAL_COMMUNITY): Payer: Medicaid Other

## 2016-05-22 ENCOUNTER — Emergency Department (HOSPITAL_COMMUNITY)
Admission: EM | Admit: 2016-05-22 | Discharge: 2016-05-22 | Disposition: A | Discharge status: Home / Self Care | Payer: Medicaid Other | Attending: Emergency Medicine | Admitting: Emergency Medicine

## 2016-05-22 ENCOUNTER — Encounter (HOSPITAL_COMMUNITY): Payer: Self-pay

## 2016-05-22 DIAGNOSIS — R0602 Shortness of breath: Secondary | ICD-10-CM | POA: Diagnosis present

## 2016-05-22 DIAGNOSIS — F418 Other specified anxiety disorders: Secondary | ICD-10-CM | POA: Diagnosis not present

## 2016-05-22 DIAGNOSIS — Z79899 Other long term (current) drug therapy: Secondary | ICD-10-CM | POA: Diagnosis not present

## 2016-05-22 DIAGNOSIS — E785 Hyperlipidemia, unspecified: Secondary | ICD-10-CM | POA: Diagnosis not present

## 2016-05-22 DIAGNOSIS — I10 Essential (primary) hypertension: Secondary | ICD-10-CM | POA: Diagnosis not present

## 2016-05-22 DIAGNOSIS — J449 Chronic obstructive pulmonary disease, unspecified: Secondary | ICD-10-CM | POA: Diagnosis not present

## 2016-05-22 DIAGNOSIS — J45901 Unspecified asthma with (acute) exacerbation: Secondary | ICD-10-CM | POA: Insufficient documentation

## 2016-05-22 DIAGNOSIS — E119 Type 2 diabetes mellitus without complications: Secondary | ICD-10-CM | POA: Insufficient documentation

## 2016-05-22 DIAGNOSIS — F419 Anxiety disorder, unspecified: Secondary | ICD-10-CM

## 2016-05-22 HISTORY — DX: Repeated falls: R29.6

## 2016-05-22 HISTORY — DX: Other abnormalities of gait and mobility: R26.89

## 2016-05-22 LAB — CBC WITH DIFFERENTIAL/PLATELET
BASOS ABS: 0 10*3/uL (ref 0.0–0.1)
BASOS PCT: 0 %
Eosinophils Absolute: 0.1 10*3/uL (ref 0.0–0.7)
Eosinophils Relative: 2 %
HEMATOCRIT: 42.3 % (ref 36.0–46.0)
HEMOGLOBIN: 13.9 g/dL (ref 12.0–15.0)
Lymphocytes Relative: 29 %
Lymphs Abs: 1.5 10*3/uL (ref 0.7–4.0)
MCH: 31.3 pg (ref 26.0–34.0)
MCHC: 32.9 g/dL (ref 30.0–36.0)
MCV: 95.3 fL (ref 78.0–100.0)
Monocytes Absolute: 0.2 10*3/uL (ref 0.1–1.0)
Monocytes Relative: 4 %
NEUTROS ABS: 3.2 10*3/uL (ref 1.7–7.7)
NEUTROS PCT: 65 %
Platelets: 395 10*3/uL (ref 150–400)
RBC: 4.44 MIL/uL (ref 3.87–5.11)
RDW: 13 % (ref 11.5–15.5)
WBC: 5 10*3/uL (ref 4.0–10.5)

## 2016-05-22 LAB — BASIC METABOLIC PANEL
Anion gap: 4 — ABNORMAL LOW (ref 5–15)
BUN: 14 mg/dL (ref 6–20)
CALCIUM: 9.3 mg/dL (ref 8.9–10.3)
CO2: 28 mmol/L (ref 22–32)
CREATININE: 0.9 mg/dL (ref 0.44–1.00)
Chloride: 107 mmol/L (ref 101–111)
GFR calc non Af Amer: >60 mL/min (ref >60)
Glucose, Bld: 111 mg/dL — ABNORMAL HIGH (ref 65–99)
Potassium: 3.5 mmol/L (ref 3.5–5.1)
Sodium: 139 mmol/L (ref 135–145)

## 2016-05-22 LAB — TROPONIN I

## 2016-05-22 MED ORDER — ALBUTEROL SULFATE HFA 108 (90 BASE) MCG/ACT IN AERS: 2.0000 | INHALATION_SPRAY | RESPIRATORY_TRACT | Status: DC | PRN

## 2016-05-22 MED ORDER — PREDNISONE 20 MG PO TABS: 40.0000 mg | ORAL_TABLET | Freq: Every day | ORAL | Status: DC

## 2016-05-22 MED ORDER — PREDNISONE 50 MG PO TABS
60.0000 mg | ORAL_TABLET | Freq: Once | ORAL | Status: AC
  Administered 2016-05-22: 60 mg via ORAL
  Filled 2016-05-22: qty 1

## 2016-05-22 MED ORDER — ALBUTEROL SULFATE (2.5 MG/3ML) 0.083% IN NEBU: 2.5000 mg | INHALATION_SOLUTION | RESPIRATORY_TRACT | Status: DC | PRN

## 2016-05-22 MED ORDER — IPRATROPIUM-ALBUTEROL 0.5-2.5 (3) MG/3ML IN SOLN
3.0000 mL | Freq: Once | RESPIRATORY_TRACT | Status: AC
  Administered 2016-05-22: 3 mL via RESPIRATORY_TRACT
  Filled 2016-05-22: qty 3

## 2016-05-22 MED ORDER — ALBUTEROL SULFATE (2.5 MG/3ML) 0.083% IN NEBU
2.5000 mg | INHALATION_SOLUTION | Freq: Once | RESPIRATORY_TRACT | Status: AC
  Administered 2016-05-22: 2.5 mg via RESPIRATORY_TRACT
  Filled 2016-05-22: qty 3

## 2016-05-22 MED ORDER — ONDANSETRON 4 MG PO TBDP
4.0000 mg | ORAL_TABLET | Freq: Once | ORAL | Status: AC
  Administered 2016-05-22: 4 mg via ORAL
  Filled 2016-05-22: qty 1

## 2016-05-22 NOTE — ED Provider Notes (Signed)
CSN: [id number]     Arrival date & time 05/22/16  1452 History   First MD Initiated Contact with Patient 05/22/16 1510     Chief Complaint  Patient presents with  . Shortness of Breath      HPI Pt was seen at 1515.  Per pt, c/o gradual onset and persistence of multiple intermittent episodes of SOB and wheezing for the past several days. Pt states she has been using her home nebs with transient relief. Pt states her symptoms worsened today approximately 1300/1330 when she "got locked out of my house by accident." Pt states she "had a panic attack" when she realized she was locked outside, felt like she "was getting hot" and had one episode of N/V. Pt states she "splashed water on her face" and then walked to the ED "because my neighbor's wouldn't let me in their houses." Denies CP/palpitations, no cough, no abd pain, no diarrhea, no back pain.    Past Medical History  Diagnosis Date  . Hypertension   . COPD (chronic obstructive pulmonary disease) ([place])   . Hyperlipidemia   . Prediabetes   . Anxiety     with social phobia  . Anemia     iron deficinecy  . Depression   . ADD (attention deficit disorder)   . Glaucoma   . Diabetes mellitus   . Social phobia   . Acid reflux   . Asthma   . Chest pain     that occurs with anxiety  . Panic attack   . Normal cardiac stress test 12/2015    low risk study  . Balance problem   . Recurrent falls    Past Surgical History  Procedure Laterality Date  . Cholecystectomy    . Right elbow      reconstruction  . Abdominal hysterectomy      fibroids, both ovaries left intact  . Cosmetic surgery      elbow  . Lipoma excision  04/30/2012    Procedure: EXCISION LIPOMA;  Surgeon: Donato Heinz, MD;  Location: [facility];  Service: General;  Laterality: Right;  Excision soft tissue mass right thigh  . Fracture surgery      Recurrent elbow surgery   Family History  Problem Relation Age of Onset  . Hypertension Mother   . Diabetes Father   .  Heart disease Father   . Anesthesia problems Neg Hx   . Malignant hyperthermia Neg Hx   . Hypotension Neg Hx   . Pseudochol deficiency Neg Hx    Social History  Substance Use Topics  . Smoking status: Never Smoker   . Smokeless tobacco: Never Used  . Alcohol Use: No   OB History    Gravida Para Term Preterm AB TAB SAB Ectopic Multiple Living   2 2 2       2      Review of Systems ROS: Statement: All systems negative except as marked or noted in the HPI; Constitutional: Negative for fever and chills. ; ; Eyes: Negative for eye pain, redness and discharge. ; ; ENMT: Negative for ear pain, hoarseness, nasal congestion, sinus pressure and sore throat. ; ; Cardiovascular: Negative for chest pain, palpitations, diaphoresis, and peripheral edema. ; ; Respiratory: +SOB, wheezing. Negative for cough and stridor. ; ; Gastrointestinal: +N/V. Negative for diarrhea, abdominal pain, blood in stool, hematemesis, jaundice and rectal bleeding. . ; ; Genitourinary: Negative for dysuria, flank pain and hematuria. ; ; Musculoskeletal: Negative for back pain and neck pain.  Negative for swelling and trauma.; ; Skin: Negative for pruritus, rash, abrasions, blisters, bruising and skin lesion.; ; Neuro: Negative for headache, lightheadedness and neck stiffness. Negative for weakness, altered level of consciousness, altered mental status, extremity weakness, paresthesias, involuntary movement, seizure and syncope.; Psych:  +anxiety, panic attack. No SI, no SA, no HI, no hallucinations.   Allergies  Aspirin; Metformin and related; Other; Latex; Neosporin; and Oxycodone  Home Medications   Prior to Admission medications   Medication Sig Start Date End Date Taking? Authorizing Provider  albuterol (PROVENTIL HFA;VENTOLIN HFA) 108 (90 BASE) MCG/ACT inhaler Inhale 2 puffs into the lungs every 6 (six) hours as needed for wheezing or shortness of breath. 07/20/13   Alycia Rossetti, MD  albuterol (PROVENTIL) (2.5 MG/3ML)  0.083% nebulizer solution Take 3 mLs (2.5 mg total) by nebulization every 4 (four) hours as needed for wheezing or shortness of breath. 10/06/14   Alycia Rossetti, MD  amLODipine (NORVASC) 5 MG tablet TAKE 1 TABLET BY MOUTH ONCE DAILY. 02/29/16   Alycia Rossetti, MD  clonazePAM (KLONOPIN) 0.5 MG tablet TAKE 1 TABLET BY MOUTH TWICE DAILY AS NEEDED FOR ANXIETY. 04/27/15   Alycia Rossetti, MD  CRESTOR 20 MG tablet TAKE ONE TABLET BY MOUTH ONCE DAILY. 10/24/15   Alycia Rossetti, MD  FLUoxetine (PROZAC) 10 MG capsule Take 10 mg by mouth daily.    Historical Provider, MD  ibuprofen (ADVIL,MOTRIN) 800 MG tablet Take 1 tablet (800 mg total) by mouth every 8 (eight) hours as needed. 05/14/16   Alycia Rossetti, MD  ketoconazole (NIZORAL) 2 % shampoo Apply 1 application topically 2 (two) times a week. 12/07/15   Alycia Rossetti, MD  lisinopril-hydrochlorothiazide (PRINZIDE,ZESTORETIC) 20-12.5 MG tablet TAKE 2 TABLETS BY MOUTH DAILY. 02/29/16   Alycia Rossetti, MD  meloxicam (MOBIC) 7.5 MG tablet Take 1 tablet (7.5 mg total) by mouth daily. Patient taking differently: Take 7.5 mg by mouth daily as needed for pain.  12/07/15   Alycia Rossetti, MD  tiotropium (SPIRIVA) 18 MCG inhalation capsule Place 1 capsule (18 mcg total) into inhaler and inhale daily. 07/20/13   Alycia Rossetti, MD  traMADol (ULTRAM) 50 MG tablet TAKE (1) TABLET BY MOUTH EVERY (8) HOURS AS NEEDED FOR PAIN. 04/27/15   Alycia Rossetti, MD   BP 143/95 mmHg  Pulse 111  Temp(Src) 97.7 F (36.5 C) (Oral)  Resp 18  Ht 5\' 1"  (1.549 m)  Wt 145 lb (65.772 kg)  BMI 27.41 kg/m2  SpO2 100% Physical Exam  1520: Physical examination:  Nursing notes reviewed; Vital signs and O2 SAT reviewed;  Constitutional: Well developed, Well nourished, Well hydrated, In no acute distress; Head:  Normocephalic, atraumatic; Eyes: EOMI, PERRL, No scleral icterus; ENMT: Mouth and pharynx normal, Mucous membranes moist; Neck: Supple, Full range of motion, No  lymphadenopathy; Cardiovascular: Regular rate and rhythm, No gallop; Respiratory: Breath sounds diminished & equal bilaterally, No wheezes.  Speaking full sentences with ease, Normal respiratory effort/excursion; Chest: Nontender, Movement normal; Abdomen: Soft, Nontender, Nondistended, Normal bowel sounds; Genitourinary: No CVA tenderness; Extremities: Pulses normal, No tenderness, No edema, No calf edema or asymmetry.; Neuro: AA&Ox3, Major CN grossly intact.  Speech clear. No gross focal motor or sensory deficits in extremities.; Skin: Color normal, Warm, Dry.; Psych:  Anxious.   ED Course  Procedures (including critical care time) Labs Review  Imaging Review  I have personally reviewed and evaluated these images and lab results as part of my medical decision-making.  EKG Interpretation   Date/Time:  Thursday May 22 2016 15:09:23 EDT Ventricular Rate:  94 PR Interval:  106 QRS Duration: 73 QT Interval:  341 QTC Calculation: 426 R Axis:   58 Text Interpretation:  Sinus rhythm Short PR interval When compared with  ECG of 05/23/2014 No significant change was found Confirmed by [facility]  MD,  Nunzio Cory [phone]) on 05/22/2016 3:39:21 PM      MDM  MDM Reviewed: previous chart, nursing note and vitals Reviewed previous: labs and ECG Interpretation: labs, x-ray and ECG     Results for orders placed or performed during the hospital encounter of 05/22/16  Troponin I  Result Value Ref Range   Troponin I <0.03 <0.031 ng/mL  CBC with Differential  Result Value Ref Range   WBC 5.0 4.0 - 10.5 K/uL   RBC 4.44 3.87 - 5.11 MIL/uL   Hemoglobin 13.9 12.0 - 15.0 g/dL   HCT 42.3 36.0 - 46.0 %   MCV 95.3 78.0 - 100.0 fL   MCH 31.3 26.0 - 34.0 pg   MCHC 32.9 30.0 - 36.0 g/dL   RDW 13.0 11.5 - 15.5 %   Platelets 395 150 - 400 K/uL   Neutrophils Relative % 65 %   Neutro Abs 3.2 1.7 - 7.7 K/uL   Lymphocytes Relative 29 %   Lymphs Abs 1.5 0.7 - 4.0 K/uL   Monocytes Relative 4 %   Monocytes  Absolute 0.2 0.1 - 1.0 K/uL   Eosinophils Relative 2 %   Eosinophils Absolute 0.1 0.0 - 0.7 K/uL   Basophils Relative 0 %   Basophils Absolute 0.0 0.0 - 0.1 K/uL  Basic metabolic panel  Result Value Ref Range   Sodium 139 135 - 145 mmol/L   Potassium 3.5 3.5 - 5.1 mmol/L   Chloride 107 101 - 111 mmol/L   CO2 28 22 - 32 mmol/L   Glucose, Bld 111 (H) 65 - 99 mg/dL   BUN 14 6 - 20 mg/dL   Creatinine, Ser 0.90 0.44 - 1.00 mg/dL   Calcium 9.3 8.9 - 10.3 mg/dL   GFR calc non Af Amer >60 >60 mL/min   GFR calc Af Amer >60 >60 mL/min   Anion gap 4 (L) 5 - 15   Dg Chest 2 View 05/22/2016  CLINICAL DATA:  Shortness of breath and vomiting.  History of COPD. EXAM: CHEST  2 VIEW COMPARISON:  None. FINDINGS: The heart size and mediastinal contours are within normal limits. Both lungs are clear. The visualized skeletal structures are unremarkable. IMPRESSION: No active cardiopulmonary disease. Electronically Signed   By: Franki Cabot M.D.   On: 05/22/2016 17:01    1840:  Pt states she "feels better" after neb and steroid. Pt has tol PO well without N/V. Abd remains benign.  NAD, lungs CTA bilat, no wheezing, resps easy, speaking full sentences, Sats 100% R/A.  Pt ambulated around the ED with Sats remaining 99-100 % R/A, resps easy, NAD.  Wants to go home now.  Dx and testing d/w pt.  Questions answered.  Verb understanding, agreeable to d/c home with outpt f/u.  2000:  ED RN states pt's HR 120-130's and pt is refusing to leave. Pt continues anxious, though refuses meds for anxiety. States she "doesn't need to take" her BP meds at this time. Pt also states she "doesn't want to go home to a hot house" because her "air conditioner went out 2 months ago." Pt states she "is going to need to be admitted to  the hospital to say in a cool place." I informed pt there was no medical indication to admit her to the hospital at this time. Pt's HR quickly decreased to 100-110. Pt then stated she was ready to leave.       Francine Graven, DO 05/25/16 [phone]

## 2016-05-22 NOTE — ED Notes (Signed)
Pt wanting vitals recheck and speak to MD before discharge

## 2016-05-22 NOTE — ED Notes (Signed)
Patient's o2 sat 99-100% on room air while ambulating.

## 2016-05-22 NOTE — ED Notes (Signed)
Pt is anxious, MD at the bedside to discuss result with pt

## 2016-05-22 NOTE — Discharge Instructions (Signed)
Take the prescriptions as directed.  Use your albuterol inhaler (2 to 4 puffs) or your albuterol nebulizer (1 unit dose) every 4 hours for the next 7 days, then as needed for cough, wheezing, or shortness of breath.  Call your regular medical doctor tomorrow morning to schedule a follow up appointment within the next 3 days.  Return to the Emergency Department immediately sooner if worsening.  ° °

## 2016-05-22 NOTE — ED Notes (Signed)
Patient given discharge instruction, verbalized understand. Patient ambulatory out of the department.  

## 2016-05-22 NOTE — ED Notes (Signed)
Called out for nausea

## 2016-05-22 NOTE — ED Notes (Signed)
Pt says she was sitting outside because she got locked out of her house.  Pt says she started getting hot, sob, and vomited.  Reports history of COPD and asthma.

## 2016-05-23 ENCOUNTER — Telehealth: Payer: Self-pay | Admitting: Family Medicine

## 2016-05-23 NOTE — Telephone Encounter (Signed)
Per patient she says she was supposed to call you and let you know if her air condition was still broke, it has still not been fixed  [phone] (H)

## 2016-05-23 NOTE — Telephone Encounter (Signed)
Im not sure what this is in reference too. Does she need a letter for the landlord,stating her medical conditions?

## 2016-05-26 NOTE — Telephone Encounter (Signed)
Call placed to patient to inquire. LMTRC. 

## 2016-05-28 NOTE — Telephone Encounter (Signed)
Multiple calls placed to patient with no answer and no return call.   Message to be closed.  

## 2016-06-06 ENCOUNTER — Ambulatory Visit (HOSPITAL_COMMUNITY): Admission: RE | Admit: 2016-06-06 | Payer: Medicaid Other | Source: Ambulatory Visit

## 2016-06-17 ENCOUNTER — Ambulatory Visit (HOSPITAL_COMMUNITY): Admission: RE | Admit: 2016-06-17 | Payer: Medicaid Other | Source: Ambulatory Visit

## 2016-06-20 ENCOUNTER — Ambulatory Visit: Payer: Medicaid Other | Admitting: Family Medicine

## 2016-07-18 ENCOUNTER — Ambulatory Visit: Payer: Medicaid Other | Admitting: Family Medicine

## 2016-09-11 ENCOUNTER — Other Ambulatory Visit: Payer: Self-pay | Admitting: *Deleted

## 2016-09-11 MED ORDER — BLOOD GLUCOSE SYSTEM PAK KIT: PACK | 1 refills | Status: DC

## 2016-09-11 NOTE — Telephone Encounter (Signed)
Received call from patient.   Reports that she accidentally threw away meter.   Requested new order to be sent to [facility].   Prescription sent to pharmacy.

## 2017-01-16 ENCOUNTER — Ambulatory Visit: Payer: Medicaid Other | Admitting: Family Medicine

## 2017-01-26 ENCOUNTER — Other Ambulatory Visit: Payer: Self-pay | Admitting: Family Medicine

## 2017-04-10 ENCOUNTER — Encounter: Payer: Self-pay | Admitting: Family Medicine

## 2017-04-10 ENCOUNTER — Ambulatory Visit (INDEPENDENT_AMBULATORY_CARE_PROVIDER_SITE_OTHER): Payer: Medicaid Other | Admitting: Family Medicine

## 2017-04-10 VITALS — BP 144/82 | HR 90 | Temp 98.6°F | Resp 14 | Ht 61.0 in | Wt 179.0 lb

## 2017-04-10 DIAGNOSIS — J410 Simple chronic bronchitis: Secondary | ICD-10-CM | POA: Diagnosis not present

## 2017-04-10 DIAGNOSIS — I10 Essential (primary) hypertension: Secondary | ICD-10-CM | POA: Diagnosis not present

## 2017-04-10 DIAGNOSIS — E119 Type 2 diabetes mellitus without complications: Secondary | ICD-10-CM

## 2017-04-10 DIAGNOSIS — F331 Major depressive disorder, recurrent, moderate: Secondary | ICD-10-CM | POA: Diagnosis not present

## 2017-04-10 MED ORDER — TIOTROPIUM BROMIDE MONOHYDRATE 2.5 MCG/ACT IN AERS: INHALATION_SPRAY | RESPIRATORY_TRACT | 3 refills | Status: DC

## 2017-04-10 MED ORDER — LISINOPRIL-HYDROCHLOROTHIAZIDE 20-12.5 MG PO TABS: 2.0000 | ORAL_TABLET | Freq: Every day | ORAL | 3 refills | Status: DC

## 2017-04-10 MED ORDER — ALBUTEROL SULFATE (2.5 MG/3ML) 0.083% IN NEBU: 2.5000 mg | INHALATION_SOLUTION | RESPIRATORY_TRACT | 2 refills | Status: DC | PRN

## 2017-04-10 MED ORDER — ALBUTEROL SULFATE HFA 108 (90 BASE) MCG/ACT IN AERS: 2.0000 | INHALATION_SPRAY | RESPIRATORY_TRACT | 2 refills | Status: DC | PRN

## 2017-04-10 MED ORDER — ALBUTEROL SULFATE HFA 108 (90 BASE) MCG/ACT IN AERS
2.0000 | INHALATION_SPRAY | RESPIRATORY_TRACT | 2 refills | Status: DC | PRN
Start: 2017-04-10 — End: 2020-03-23

## 2017-04-10 NOTE — Assessment & Plan Note (Signed)
Restart spiriva, albuterol prn

## 2017-04-10 NOTE — Progress Notes (Signed)
Subjective:    Patient ID: Kayla Choi, female    DOB: March 31, 1966, 51 y.o.   MRN: [id number]  Patient presents for Follow-up (is not fasting)    Patient here follow-up. She was last seen in January of last year. Unfortunately she lost her home and has not been on her medications on a regular basis. She now is staying with a friend states that she is able to get back on her medications. She hasn't seen her psychiatrist-she took her off of the Prozac and the clonazepam and she is in group therapy. She admits to stress eating over the past 6 months and has gained 30 pounds. Today she sits around and eat chips and cookies most the day she cannot leave the house that she has no transportation. She did recently start walking and thinks she has lost about 5 pounds over the past couple weeks. She is worried about her blood sugar stays that she has been urinating more frequently but does not have any burning sensation.  She ran out of BP meds about 2 months ago But she checks at home, states it has been good   Review Of Systems:  GEN- denies fatigue, fever, weight loss,weakness, recent illness HEENT- denies eye drainage, change in vision, nasal discharge, CVS- denies chest pain, palpitations RESP- denies SOB, cough, wheeze ABD- denies N/V, change in stools, abd pain GU- denies dysuria, hematuria, dribbling, incontinence MSK- denies joint pain, muscle aches, injury Neuro- denies headache, dizziness, syncope, seizure activity       Objective:    BP (!) 144/82   Pulse 90   Temp 98.6 F (37 C) (Oral)   Resp 14   Ht 5\' 1"  (1.549 m)   Wt 179 lb (81.2 kg)   SpO2 98%   BMI 33.82 kg/m  GEN- NAD, alert and oriented x3,obese HEENT- PERRL, EOMI, non injected sclera, pink conjunctiva, MMM, oropharynx clear Neck- Supple, no thyromegaly CVS- RRR, no murmur RESP-CTAB ABD-NABS,soft,NT,ND Psych- normal affect and mood EXT- No edema Pulses- Radial, DP- 2+        Assessment & Plan:    She  is checking into social services for other resources and a case Freight forwarder. She is getting food stamps, given vouchers for [employer] bus but she has panic attacks around groups of people therefore does not ride   Problem List Items Addressed This Visit    MDD (major depressive disorder)    No current meds per psychiatry She continues in group therapy      Essential hypertension    Uncontrolled, restart lisinopril HCTZ first Hold on norvasc      Relevant Medications   lisinopril-hydrochlorothiazide (PRINZIDE,ZESTORETIC) 20-12.5 MG tablet   Diabetes mellitus, type II ([place])    Concern with her 30 pound weight gain Will check A1C  Medications pending on results Discussed the stress eating and junk food      Relevant Medications   lisinopril-hydrochlorothiazide (PRINZIDE,ZESTORETIC) 20-12.5 MG tablet   Other Relevant Orders   CBC with Differential/Platelet   Comprehensive metabolic panel   Hemoglobin A1c   COPD (chronic obstructive pulmonary disease) ([facility]) - Primary    Restart spiriva, albuterol prn      Relevant Medications   albuterol (PROVENTIL) (2.5 MG/3ML) 0.083% nebulizer solution   albuterol (PROVENTIL HFA;VENTOLIN HFA) 108 (90 Base) MCG/ACT inhaler   Tiotropium Bromide Monohydrate (SPIRIVA RESPIMAT) 2.5 MCG/ACT AERS      Note: This dictation was prepared with Dragon dictation along with smaller phrase technology. Any transcriptional errors that  result from this process are unintentional.

## 2017-04-10 NOTE — Assessment & Plan Note (Signed)
Concern with her 30 pound weight gain Will check A1C  Medications pending on results Discussed the stress eating and junk food

## 2017-04-10 NOTE — Assessment & Plan Note (Signed)
No current meds per psychiatry She continues in group therapy

## 2017-04-10 NOTE — Patient Instructions (Signed)
F/U 4 weeks  

## 2017-04-10 NOTE — Assessment & Plan Note (Signed)
Uncontrolled, restart lisinopril HCTZ first Hold on norvasc

## 2017-04-11 LAB — CBC WITH DIFFERENTIAL/PLATELET
BASOS PCT: 1 %
Basophils Absolute: 70 cells/uL (ref 0–200)
EOS ABS: 280 {cells}/uL (ref 15–500)
Eosinophils Relative: 4 %
HEMATOCRIT: 40.8 % (ref 35.0–45.0)
HEMOGLOBIN: 13.3 g/dL (ref 12.0–15.0)
LYMPHS ABS: 2520 {cells}/uL (ref 850–3900)
Lymphocytes Relative: 36 %
MCH: 30.8 pg (ref 27.0–33.0)
MCHC: 32.6 g/dL (ref 32.0–36.0)
MCV: 94.4 fL (ref 80.0–100.0)
MONO ABS: 350 {cells}/uL (ref 200–950)
MPV: 11.9 fL (ref 7.5–12.5)
Monocytes Relative: 5 %
NEUTROS ABS: 3780 {cells}/uL (ref 1500–7800)
Neutrophils Relative %: 54 %
Platelets: 343 10*3/uL (ref 140–400)
RBC: 4.32 MIL/uL (ref 3.80–5.10)
RDW: 13 % (ref 11.0–15.0)
WBC: 7 10*3/uL (ref 3.8–10.8)

## 2017-04-11 LAB — COMPREHENSIVE METABOLIC PANEL
ALK PHOS: 43 U/L (ref 33–130)
ALT: 19 U/L (ref 6–29)
AST: 16 U/L (ref 10–35)
Albumin: 4.2 g/dL (ref 3.6–5.1)
BILIRUBIN TOTAL: 0.6 mg/dL (ref 0.2–1.2)
BUN: 17 mg/dL (ref 7–25)
CALCIUM: 9.4 mg/dL (ref 8.6–10.4)
CO2: 20 mmol/L (ref 20–31)
Chloride: 106 mmol/L (ref 98–110)
Creat: 0.98 mg/dL (ref 0.50–1.05)
Glucose, Bld: 157 mg/dL — ABNORMAL HIGH (ref 70–99)
POTASSIUM: 4.2 mmol/L (ref 3.5–5.3)
Sodium: 139 mmol/L (ref 135–146)
TOTAL PROTEIN: 7.2 g/dL (ref 6.1–8.1)

## 2017-04-11 LAB — HEMOGLOBIN A1C
HEMOGLOBIN A1C: 7.3 % — AB (ref <5.7)
Mean Plasma Glucose: 163 mg/dL

## 2017-04-14 ENCOUNTER — Other Ambulatory Visit: Payer: Self-pay | Admitting: *Deleted

## 2017-04-14 ENCOUNTER — Telehealth: Payer: Self-pay | Admitting: *Deleted

## 2017-04-14 MED ORDER — METFORMIN HCL 500 MG PO TABS: 500.0000 mg | ORAL_TABLET | Freq: Every day | ORAL | 3 refills | Status: DC

## 2017-04-14 MED ORDER — BLOOD GLUCOSE SYSTEM PAK KIT: PACK | 1 refills | Status: DC

## 2017-04-14 MED ORDER — BLOOD GLUCOSE TEST VI STRP: ORAL_STRIP | 1 refills | Status: DC

## 2017-04-14 MED ORDER — TIOTROPIUM BROMIDE MONOHYDRATE 2.5 MCG/ACT IN AERS: INHALATION_SPRAY | RESPIRATORY_TRACT | 3 refills | Status: DC

## 2017-04-14 NOTE — Telephone Encounter (Signed)
Received request from pharmacy for PA on [place].   PA submitted.   Dx: J44.9- COPD.   PA approved 04/14/2017- 04/14/2018.  PA # [id number].  Pharmacy made aware.

## 2017-05-11 ENCOUNTER — Encounter: Payer: Self-pay | Admitting: Family Medicine

## 2017-05-11 ENCOUNTER — Ambulatory Visit (INDEPENDENT_AMBULATORY_CARE_PROVIDER_SITE_OTHER): Payer: Medicaid Other | Admitting: Family Medicine

## 2017-05-11 VITALS — BP 140/82 | HR 84 | Temp 98.0°F | Resp 16 | Ht 61.0 in | Wt 181.0 lb

## 2017-05-11 DIAGNOSIS — I10 Essential (primary) hypertension: Secondary | ICD-10-CM | POA: Diagnosis not present

## 2017-05-11 DIAGNOSIS — E119 Type 2 diabetes mellitus without complications: Secondary | ICD-10-CM

## 2017-05-11 MED ORDER — AMLODIPINE BESYLATE 5 MG PO TABS: 5.0000 mg | ORAL_TABLET | Freq: Every day | ORAL | 3 refills | Status: DC

## 2017-05-11 NOTE — Patient Instructions (Addendum)
Glucometer sent over  Norvasc 5mg  once a day in addition to the lisinopril - HCTZ F/U 3 months

## 2017-05-11 NOTE — Assessment & Plan Note (Signed)
Restart norvasc 5mg  in addition to the lisinopril 40-25mg  once a day  This was her previous regimen

## 2017-05-11 NOTE — Progress Notes (Signed)
   Subjective:    Patient ID: Kayla Choi, female    DOB: 1966/02/18, 51 y.o.   MRN: [id number]  Patient presents for Follow-up   Patient here for four-week follow-up on diabetes and hypertension DM- restarted metformin, has restarted and she had itching Denies any actual hives. States that she's had this in the past. She did not stop the medication state does not his bad anymore. She needs a new meter.   HTN- restarted lisinopril- HCTZ before the metformin and did not have any itching she does not have anything to check her blood pressure at home.       Review Of Systems:  GEN- denies fatigue, fever, weight loss,weakness, recent illness HEENT- denies eye drainage, change in vision, nasal discharge, CVS- denies chest pain, palpitations RESP- denies SOB, cough, wheeze ABD- denies N/V, change in stools, abd pain GU- denies dysuria, hematuria, dribbling, incontinence MSK- denies joint pain, muscle aches, injury Neuro- denies headache, dizziness, syncope, seizure activity       Objective:    BP 140/82   Pulse 84   Temp 98 F (36.7 C) (Oral)   Resp 16   Ht 5\' 1"  (1.549 m)   Wt 181 lb (82.1 kg)   BMI 34.20 kg/m  GEN- NAD, alert and oriented x3 HEENT- PERRL, EOMI, non injected sclera, pink conjunctiva, MMM, oropharynx clear CVS- RRR, no murmur RESP-CTAB Skin- in tact no lesions Ext- no edema Pulses- Radial, 2+        Assessment & Plan:      Problem List Items Addressed This Visit    Essential hypertension - Primary    Restart norvasc 5mg  in addition to the lisinopril 40-25mg  once a day  This was her previous regimen      Relevant Medications   amLODipine (NORVASC) 5 MG tablet   Diabetes mellitus, type II ([facility])    Stress stopping the metformin seen at the itching. She did not want to change medications.  Sure if this is actual true allergy she has been on this in the past and several complaint. Anyway she was to continue taking the medication. I did ask  about any other new food she cannot recall anything else different. She breaks out any type or rash or has any swelling she is to discontinue the medication.         Note: This dictation was prepared with Dragon dictation along with smaller phrase technology. Any transcriptional errors that result from this process are unintentional.

## 2017-05-12 ENCOUNTER — Encounter: Payer: Self-pay | Admitting: Family Medicine

## 2017-05-12 NOTE — Assessment & Plan Note (Signed)
Stress stopping the metformin seen at the itching. She did not want to change medications.  Sure if this is actual true allergy she has been on this in the past and several complaint. Anyway she was to continue taking the medication. I did ask about any other new food she cannot recall anything else different. She breaks out any type or rash or has any swelling she is to discontinue the medication.

## 2017-08-14 ENCOUNTER — Ambulatory Visit: Payer: Medicaid Other | Admitting: Family Medicine

## 2017-08-19 ENCOUNTER — Encounter: Payer: Self-pay | Admitting: Family Medicine

## 2017-08-20 ENCOUNTER — Other Ambulatory Visit: Payer: Self-pay | Admitting: Family Medicine

## 2017-09-02 ENCOUNTER — Ambulatory Visit (INDEPENDENT_AMBULATORY_CARE_PROVIDER_SITE_OTHER): Payer: Medicaid Other | Admitting: Family Medicine

## 2017-09-02 ENCOUNTER — Encounter: Payer: Self-pay | Admitting: Family Medicine

## 2017-09-02 VITALS — BP 138/84 | HR 72 | Temp 97.8°F | Resp 14 | Ht 61.0 in | Wt 180.0 lb

## 2017-09-02 DIAGNOSIS — E669 Obesity, unspecified: Secondary | ICD-10-CM | POA: Diagnosis not present

## 2017-09-02 DIAGNOSIS — E119 Type 2 diabetes mellitus without complications: Secondary | ICD-10-CM | POA: Diagnosis not present

## 2017-09-02 DIAGNOSIS — J410 Simple chronic bronchitis: Secondary | ICD-10-CM | POA: Diagnosis not present

## 2017-09-02 DIAGNOSIS — E782 Mixed hyperlipidemia: Secondary | ICD-10-CM | POA: Diagnosis not present

## 2017-09-02 DIAGNOSIS — I10 Essential (primary) hypertension: Secondary | ICD-10-CM | POA: Diagnosis not present

## 2017-09-02 DIAGNOSIS — Z23 Encounter for immunization: Secondary | ICD-10-CM

## 2017-09-02 DIAGNOSIS — E66812 Obesity, class 2: Secondary | ICD-10-CM | POA: Insufficient documentation

## 2017-09-02 NOTE — Assessment & Plan Note (Addendum)
She's been able to afford the Spiriva and keep his regularly. Continue use of albuterol as needed. I did complete her form of necessity she does have a nebulizer which she needs to use it in the event of emergencies for her breathing   Flu shot was given she also needs a tetanus with pertussis

## 2017-09-02 NOTE — Addendum Note (Signed)
Addended by: Sheral Flow on: 09/02/2017 05:30 PM   Modules accepted: Orders

## 2017-09-02 NOTE — Patient Instructions (Signed)
F/U 4 months Flu shot and Tdap

## 2017-09-02 NOTE — Assessment & Plan Note (Signed)
She states that her blood sugar has been good she did not bring her meter with her today but states the highest is then about 113 denies any hypoglycemia symptoms otherwise.

## 2017-09-02 NOTE — Assessment & Plan Note (Signed)
Blood pressure looks okay no change her medication we'll check her fasting labs.

## 2017-09-02 NOTE — Assessment & Plan Note (Signed)
Check lipid panel she is on statin drug. 

## 2017-09-02 NOTE — Progress Notes (Signed)
   Subjective:    Patient ID: Kayla Choi, female    DOB: [birth year], 51 y.o.   MRN: [id number]  Patient presents for Follow-up (is fasting); Dizziness (states that she took meds without eating and thinks that's why she is dizzy); and Forms ([employer] forms)   Pt here to f/u chronic medical problems. Felt dizzy this morning after taking meds have not eating anything Yet. She did drink some sweet tea and felt better, no current dizziness.    DM- last A1C 7.3%, taking metformin once a day , did not bring her meter, highest 113, no hypoglycemia symptoms.  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   Review Of Systems:  GEN- denies fatigue, fever, weight loss,weakness, recent illness HEENT- denies eye drainage, change in vision, nasal discharge, CVS- denies chest pain, palpitations RESP- denies SOB, cough, wheeze ABD- denies N/V, change in stools, abd pain GU- denies dysuria, hematuria, dribbling, incontinence MSK- denies joint pain, muscle aches, injury Neuro- denies headache,+dizziness, syncope, seizure activity       Objective:    BP 138/84   Pulse 72   Temp 97.8 F (36.6 C) (Oral)   Resp 14   Ht 5\' 1"  (1.549 m)   Wt 180 lb (81.6 kg)   SpO2 99%   BMI 34.01 kg/m  GEN- NAD, alert and oriented x3 HEENT- PERRL, EOMI, non injected sclera, pink conjunctiva, MMM, oropharynx clear CVS- RRR, no murmur RESP-CTAB ABD-NABS,soft,NT,ND NEURO-CNII-XII in tact , no focal deficits  EXT- No edema Pulses- Radial, DP- 2+        Assessment & Plan:      Problem List Items Addressed This Visit      Unprioritized   Obesity (BMI 30-39.9)   Hyperlipidemia    Check lipid panel she is on statin drug      Relevant Orders   Lipid panel   Essential hypertension    Blood pressure looks okay  no change her medication we'll check her fasting labs.      Diabetes mellitus, type II ([place]) - Primary    She states that her blood sugar has been good she did not bring her meter with her today but states the highest is then about 113 denies any hypoglycemia symptoms otherwise.      Relevant Orders   Microalbumin / creatinine urine ratio   CBC with Differential/Platelet   Comprehensive metabolic panel   Hemoglobin A1c   COPD (chronic obstructive pulmonary disease) ([place])    She's been able to afford the Spiriva and keep his regularly. Continue use of albuterol as needed. I did complete her form of necessity she does have a nebulizer which she needs to use it in the event of emergencies for her breathing   Flu shot was given she also needs a tetanus with pertussis         Note: This dictation was prepared with Dragon dictation along with smaller phrase technology. Any transcriptional errors that result from this process are unintentional.

## 2017-09-03 LAB — CBC WITH DIFFERENTIAL/PLATELET
Basophils Absolute: 30 cells/uL (ref 0–200)
Basophils Relative: 0.5 %
EOS PCT: 1.4 %
Eosinophils Absolute: 83 cells/uL (ref 15–500)
HEMATOCRIT: 40.8 % (ref 35.0–45.0)
HEMOGLOBIN: 13.5 g/dL (ref 11.7–15.5)
LYMPHS ABS: 1788 {cells}/uL (ref 850–3900)
MCH: 30.8 pg (ref 27.0–33.0)
MCHC: 33.1 g/dL (ref 32.0–36.0)
MCV: 93.2 fL (ref 80.0–100.0)
MPV: 11.6 fL (ref 7.5–12.5)
Monocytes Relative: 6.3 %
NEUTROS ABS: 3629 {cells}/uL (ref 1500–7800)
Neutrophils Relative %: 61.5 %
Platelets: 331 10*3/uL (ref 140–400)
RBC: 4.38 10*6/uL (ref 3.80–5.10)
RDW: 11.8 % (ref 11.0–15.0)
Total Lymphocyte: 30.3 %
WBC mixed population: 372 cells/uL (ref 200–950)
WBC: 5.9 10*3/uL (ref 3.8–10.8)

## 2017-09-03 LAB — HEMOGLOBIN A1C
EAG (MMOL/L): 7.9 (calc)
Hgb A1c MFr Bld: 6.6 % of total Hgb — ABNORMAL HIGH (ref <5.7)
MEAN PLASMA GLUCOSE: 143 (calc)

## 2017-09-03 LAB — COMPREHENSIVE METABOLIC PANEL
AG RATIO: 1.4 (calc) (ref 1.0–2.5)
ALBUMIN MSPROF: 3.8 g/dL (ref 3.6–5.1)
ALT: 16 U/L (ref 6–29)
AST: 14 U/L (ref 10–35)
Alkaline phosphatase (APISO): 45 U/L (ref 33–130)
BILIRUBIN TOTAL: 0.9 mg/dL (ref 0.2–1.2)
BUN: 9 mg/dL (ref 7–25)
CHLORIDE: 103 mmol/L (ref 98–110)
CO2: 25 mmol/L (ref 20–32)
Calcium: 9 mg/dL (ref 8.6–10.4)
Creat: 0.77 mg/dL (ref 0.50–1.05)
GLUCOSE: 235 mg/dL — AB (ref 65–99)
Globulin: 2.8 g/dL (calc) (ref 1.9–3.7)
Potassium: 4.2 mmol/L (ref 3.5–5.3)
Sodium: 135 mmol/L (ref 135–146)
TOTAL PROTEIN: 6.6 g/dL (ref 6.1–8.1)

## 2017-09-03 LAB — MICROALBUMIN / CREATININE URINE RATIO
CREATININE, URINE: 152 mg/dL (ref 20–275)
MICROALB UR: 4.5 mg/dL
MICROALB/CREAT RATIO: 30 ug/mg{creat} — AB (ref <30)

## 2017-09-03 LAB — LIPID PANEL
CHOL/HDL RATIO: 3.5 (calc) (ref <5.0)
CHOLESTEROL: 199 mg/dL (ref <200)
HDL: 57 mg/dL (ref >50)
LDL Cholesterol (Calc): 115 mg/dL (calc) — ABNORMAL HIGH
NON-HDL CHOLESTEROL (CALC): 142 mg/dL — AB (ref <130)
TRIGLYCERIDES: 152 mg/dL — AB (ref <150)

## 2017-12-05 ENCOUNTER — Encounter (HOSPITAL_COMMUNITY): Payer: Self-pay | Admitting: Emergency Medicine

## 2017-12-05 ENCOUNTER — Other Ambulatory Visit: Payer: Self-pay

## 2017-12-05 ENCOUNTER — Emergency Department (HOSPITAL_COMMUNITY): Payer: Medicaid Other

## 2017-12-05 ENCOUNTER — Emergency Department (HOSPITAL_COMMUNITY)
Admission: EM | Admit: 2017-12-05 | Discharge: 2017-12-05 | Disposition: A | Discharge status: Home / Self Care | Payer: Medicaid Other | Attending: Emergency Medicine | Admitting: Emergency Medicine

## 2017-12-05 DIAGNOSIS — E119 Type 2 diabetes mellitus without complications: Secondary | ICD-10-CM | POA: Insufficient documentation

## 2017-12-05 DIAGNOSIS — Y9389 Activity, other specified: Secondary | ICD-10-CM | POA: Diagnosis not present

## 2017-12-05 DIAGNOSIS — J45909 Unspecified asthma, uncomplicated: Secondary | ICD-10-CM | POA: Diagnosis not present

## 2017-12-05 DIAGNOSIS — Y998 Other external cause status: Secondary | ICD-10-CM | POA: Diagnosis not present

## 2017-12-05 DIAGNOSIS — S99911A Unspecified injury of right ankle, initial encounter: Secondary | ICD-10-CM | POA: Diagnosis present

## 2017-12-05 DIAGNOSIS — Z79899 Other long term (current) drug therapy: Secondary | ICD-10-CM | POA: Diagnosis not present

## 2017-12-05 DIAGNOSIS — R42 Dizziness and giddiness: Secondary | ICD-10-CM | POA: Diagnosis not present

## 2017-12-05 DIAGNOSIS — I10 Essential (primary) hypertension: Secondary | ICD-10-CM | POA: Diagnosis not present

## 2017-12-05 DIAGNOSIS — Z7984 Long term (current) use of oral hypoglycemic drugs: Secondary | ICD-10-CM | POA: Diagnosis not present

## 2017-12-05 DIAGNOSIS — W010XXA Fall on same level from slipping, tripping and stumbling without subsequent striking against object, initial encounter: Secondary | ICD-10-CM | POA: Insufficient documentation

## 2017-12-05 DIAGNOSIS — J449 Chronic obstructive pulmonary disease, unspecified: Secondary | ICD-10-CM | POA: Insufficient documentation

## 2017-12-05 DIAGNOSIS — Y929 Unspecified place or not applicable: Secondary | ICD-10-CM | POA: Insufficient documentation

## 2017-12-05 DIAGNOSIS — Z91040 Latex allergy status: Secondary | ICD-10-CM | POA: Diagnosis not present

## 2017-12-05 DIAGNOSIS — S82831A Other fracture of upper and lower end of right fibula, initial encounter for closed fracture: Secondary | ICD-10-CM | POA: Diagnosis not present

## 2017-12-05 LAB — CBG MONITORING, ED: Glucose-Capillary: 169 mg/dL — ABNORMAL HIGH (ref 65–99)

## 2017-12-05 MED ORDER — OXYCODONE-ACETAMINOPHEN 5-325 MG PO TABS
1.0000 | ORAL_TABLET | Freq: Once | ORAL | Status: AC
  Administered 2017-12-05: 1 via ORAL
  Filled 2017-12-05: qty 1

## 2017-12-05 MED ORDER — OXYCODONE-ACETAMINOPHEN 5-325 MG PO TABS: 1.0000 | ORAL_TABLET | Freq: Three times a day (TID) | ORAL | 0 refills | Status: DC | PRN

## 2017-12-05 NOTE — Discharge Instructions (Signed)
Please take your home blood pressure medication when you get home.  Please follow-up with Dr. Buelah Manis regarding your dizzy spells and falls.  Please go to Dr. Debroah Loop office at 2 PM on Monday.  I would recommend calling their office earlier in the morning to confirm the appointment.  Please keep the splint that was applied today clean and dry until you are seen by Dr. Percell Miller.  Please elevate your leg above the level of your heart when he was sitting and resting.  Ice can be applied for 15-20 minutes as needed to help with pain and swelling.  For mild to moderate pain, you can take 600 mg of ibuprofen with food or 650 mg of Tylenol once every 6 hours for pain control.  For severe pain that is not improved with Tylenol and ibuprofen, you can take 1 tablet of Percocet every 8 hours as needed for severe pain.  Please note this medication is a narcotic medication and can be addicting.  Please do not drive or work while taking this medication because it can cause you to be impaired.  If you develop new or worsening symptoms, including another fall or injury, weakness in the leg, or if the leg becomes red, hot, or swollen, please return to the emergency department for re-evaluation.

## 2017-12-05 NOTE — ED Provider Notes (Signed)
[facility] EMERGENCY DEPARTMENT Provider Note   CSN: [id number] Arrival date & time: 12/05/17  1249     History   Chief Complaint Chief Complaint  Patient presents with  . Ankle Pain    HPI Johnika Escareno is a 51 y.o. female with history of hypertension, DM Type II who presents to the emergency department with a chief complaint of right ankle pain that began this afternoon after a fall.  The patient reports that she was trying to take off her shoe when she suddenly became dizzy, and the next thing she remembers she was laying in the floor complaining of pain.  She denies hitting her head, nausea, or emesis. She reports a history of similar dizzy spells with falls and is being followed by Dr. Buelah Manis for these complaints.  She reports that she was supposed to have had an MRI scheduled last year, but lost her insurance and was suffering from homelessness so she was unable to follow through with imaging.  She reports that she is scheduled to follow-up again with Dr. Buelah Manis at the end of January to get the MRI of her brain reordered.  In the ED, she complains of constant, throbbing right ankle pain that radiates up the right leg to the thigh with associated numbness in her right foot, ankle, and lower leg.  No treatment prior to arrival.  She reports her pain is worse with weightbearing and ambulation.  She reports that she has not been able to ambulate since the fall.  No alleviating factors.  The history is provided by the patient. No language interpreter was used.    Past Medical History:  Diagnosis Date  . Acid reflux   . ADD (attention deficit disorder)   . Anemia    iron deficinecy  . Anxiety    with social phobia  . Asthma   . Balance problem   . Chest pain    that occurs with anxiety  . COPD (chronic obstructive pulmonary disease) ([place])   . Depression   . Diabetes mellitus   . Glaucoma   . Hyperlipidemia   . Hypertension   . Normal cardiac stress test 12/2015   low  risk study  . Panic attack   . Prediabetes   . Recurrent falls   . Social phobia     Patient Active Problem List   Diagnosis Date Noted  . Obesity (BMI 30-39.9) 09/02/2017  . Recurrent falls 05/13/2016  . Epidermal cyst 03/10/2016  . Onychomycosis of toenail 02/28/2015  . Knee pain 10/06/2014  . Neck muscle spasm 07/02/2014  . Back pain 10/23/2013  . Lesion of mouth 01/09/2013  . Headache(784.0) 10/05/2012  . Inadequate social support 06/25/2012  . Microalbuminuria 06/25/2012  . Diabetes mellitus, type II ([place]) 04/02/2012  . Keloid scar of skin 09/03/2011  . COPD (chronic obstructive pulmonary disease) ([place]) 05/21/2007  . Hyperlipidemia 01/29/2007  . Anxiety state 11/02/2006  . MDD (major depressive disorder) 11/02/2006  . Essential hypertension 11/02/2006    Past Surgical History:  Procedure Laterality Date  . ABDOMINAL HYSTERECTOMY     fibroids, both ovaries left intact  . CHOLECYSTECTOMY    . COSMETIC SURGERY     elbow  . FRACTURE SURGERY     Recurrent elbow surgery  . LIPOMA EXCISION  04/30/2012   Procedure: EXCISION LIPOMA;  Surgeon: Donato Heinz, MD;  Location: [facility];  Service: General;  Laterality: Right;  Excision soft tissue mass right thigh  . right elbow  reconstruction    OB History    Gravida Para Term Preterm AB Living   _0 SAB TAB Ectopic Multiple Live Births                   Home Medications    Prior to Admission medications   Medication Sig Start Date End Date Taking? Authorizing Provider  ACCU-CHEK AVIVA PLUS test strip USE TO TEST BLOOD SUGAR ONCE DAILY. 08/20/17   Corry, Modena Nunnery, MD  albuterol (PROVENTIL HFA;VENTOLIN HFA) 108 (90 Base) MCG/ACT inhaler Inhale 2 puffs into the lungs every 4 (four) hours as needed for wheezing or shortness of breath. 04/10/17   Alycia Rossetti, MD  albuterol (PROVENTIL) (2.5 MG/3ML) 0.083% nebulizer solution Take 3 mLs (2.5 mg total) by nebulization every 4 (four) hours as needed for  wheezing or shortness of breath. 04/10/17   Alycia Rossetti, MD  amLODipine (NORVASC) 5 MG tablet Take 1 tablet (5 mg total) by mouth daily. 05/11/17   Alycia Rossetti, MD  Blood Glucose Monitoring Suppl (BLOOD GLUCOSE SYSTEM PAK) KIT Please dispense based on patient and insurance preference. Use as directed to monitor FSBS 1x daily. Dx: E11.9. 04/14/17   Alycia Rossetti, MD  CRESTOR 20 MG tablet TAKE ONE TABLET BY MOUTH ONCE DAILY. 10/24/15   Alycia Rossetti, MD  ibuprofen (ADVIL,MOTRIN) 800 MG tablet Take 1 tablet (800 mg total) by mouth every 8 (eight) hours as needed. 05/14/16   Alycia Rossetti, MD  lisinopril-hydrochlorothiazide (PRINZIDE,ZESTORETIC) 20-12.5 MG tablet Take 2 tablets by mouth daily. 04/10/17   Alycia Rossetti, MD  metFORMIN (GLUCOPHAGE) 500 MG tablet Take 1 tablet (500 mg total) by mouth daily with breakfast. 04/14/17   Alycia Rossetti, MD  oxyCODONE-acetaminophen (PERCOCET/ROXICET) 5-325 MG tablet Take 1 tablet by mouth every 8 (eight) hours as needed for severe pain. 12/05/17   Anetra Czerwinski A, PA-C  Tiotropium Bromide Monohydrate (SPIRIVA RESPIMAT) 2.5 MCG/ACT AERS 2 puff daily 04/14/17   Alycia Rossetti, MD    Family History Family History  Problem Relation Age of Onset  . Hypertension Mother   . Diabetes Father   . Heart disease Father   . Anesthesia problems Neg Hx   . Malignant hyperthermia Neg Hx   . Hypotension Neg Hx   . Pseudochol deficiency Neg Hx     Social History Social History   Tobacco Use  . Smoking status: Never Smoker  . Smokeless tobacco: Never Used  Substance Use Topics  . Alcohol use: No    Alcohol/week: 0.0 oz  . Drug use: No     Allergies   Aspirin; Metformin and related; Other; Latex; Neosporin [neomycin-bacitracin zn-polymyx]; and Oxycodone   Review of Systems Review of Systems  Constitutional: Negative for activity change, chills and fever.  Respiratory: Negative for shortness of breath.   Cardiovascular: Negative for  chest pain.  Gastrointestinal: Negative for abdominal pain.  Musculoskeletal: Positive for arthralgias, gait problem, joint swelling and myalgias. Negative for back pain.  Skin: Negative for rash.  Neurological: Positive for dizziness. Negative for weakness, light-headedness and numbness.     Physical Exam Updated Vital Signs BP (!) 188/95 (BP Location: Left Arm) Comment: has not taken BP Rx today  Pulse 81   Temp 98.4 F (36.9 C) (Oral)   Resp 16   Ht 5' (1.524 m)   Wt 86.2 kg (190 lb)   SpO2 100%   BMI 37.11 kg/m  Physical Exam  Constitutional: She is oriented to person, place, and time. No distress.  HENT:  Head: Normocephalic and atraumatic.  Eyes: Conjunctivae and EOM are normal. Pupils are equal, round, and reactive to light.  Neck: Neck supple.  Cardiovascular: Normal rate, regular rhythm, normal heart sounds and intact distal pulses. Exam reveals no gallop and no friction rub.  No murmur heard. Pulmonary/Chest: Effort normal and breath sounds normal. No stridor. No respiratory distress. She has no wheezes. She has no rales. She exhibits no tenderness.  Abdominal: Soft. She exhibits no distension and no mass. There is no tenderness. There is no rebound and no guarding. No hernia.  Musculoskeletal: Normal range of motion. She exhibits edema and tenderness. She exhibits no deformity.  TTP superior to the right lateral malleolus. No medial malleolus tenderness. 2+ DP and PT pulses. Sensation is intact. Moderate edema noted to the right ankle. Decreased strength against resistance with dorsiflexion and plantarflexion to the right foot secondary to pain. Sensation is intact throughout. Able to independently move all digits of the right foot.   Neurological: She is alert and oriented to person, place, and time.  Cranial nerves 2-12 intact. Finger-to-nose is normal. 5/5 motor strength of the bilateral upper and lower extremities. Moves all four extremities. NVI.    Skin: Skin is  warm. Capillary refill takes less than 2 seconds. No rash noted. She is not diaphoretic. No erythema. No pallor.  Psychiatric: Her behavior is normal.  Nursing note and vitals reviewed.  ED Treatments / Results  Labs (all labs ordered are listed, but only abnormal results are displayed) Labs Reviewed  CBG MONITORING, ED - Abnormal; Notable for the following components:      Result Value   Glucose-Capillary 169 (*)    All other components within normal limits    EKG  EKG Interpretation None       Radiology Dg Ankle Complete Right  Result Date: 12/05/2017 CLINICAL DATA:  Right ankle pain after twisting injury. EXAM: RIGHT ANKLE - COMPLETE 3+ VIEW COMPARISON:  Right ankle x-rays dated December 17, 2012. FINDINGS: There is minimally displaced spiral fracture of the distal fibula, with slight lateral displacement. No additional fracture seen. The ankle mortise is maintained. The talar dome is intact. Small tibiotalar joint effusion. Prominent soft tissue swelling about the ankle. IMPRESSION: 1. Minimally displaced spiral fracture of the distal fibula with relatively preserved ankle mortise. Electronically Signed   By: Titus Dubin M.D.   On: 12/05/2017 13:36    Procedures Procedures (including critical care time)  Medications Ordered in ED Medications  oxyCODONE-acetaminophen (PERCOCET/ROXICET) 5-325 MG per tablet 1 tablet (1 tablet Oral Given 12/05/17 1642)     Initial Impression / Assessment and Plan / ED Course  I have reviewed the triage vital signs and the nursing notes.  Pertinent labs & imaging results that were available during my care of the patient were reviewed by me and considered in my medical decision making (see chart for details).     51 year old female with a h/o of recurrent dizzy spells and falls. She has previously been seen by her PCP by Dr. Buelah Manis and will be following up for a brain MRI within the next month. On physical exam, she has no focal  neurological deficits. She denies hitting her head, LOC, N/V. She has no other symptoms except for her right ankle pain and swelling at this time. She declines further syncope work up in the ED at this time.   Patient X-Ray with  minimally displaced spiral fracture of the distal fibula. Ankle mortise is relatively preserved. Pain managed in ED. Spoke with Dr. Fredonia Highland to discuss the patient, and he advised her to follow up in his office in 2 days, 12/31 at 2 PM. She was placed in a posterior short leg splint with a stirrup and given crutches. NVI post-splint placement. Pt advised to follow up with orthopedics for further evaluation and treatment.  Pain managed in the department and will discharge the patient with pain control. A 19-monthprescription history query was performed using the [place] CSRS prior to discharge. Patient will be dc home & is agreeable with above plan. I have also discussed reasons to return immediately to the ER.  Patient expresses understanding and agrees with plan.  Final Clinical Impressions(s) / ED Diagnoses   Final diagnoses:  Other closed fracture of distal end of right fibula, initial encounter    ED Discharge Orders        Ordered    oxyCODONE-acetaminophen (PERCOCET/ROXICET) 5-325 MG tablet  Every 8 hours PRN     12/05/17 1557       Yao Hyppolite, MForemanA, PA-C 12/06/17 2049    CNat Christen MD 12/08/17 1244

## 2017-12-05 NOTE — ED Triage Notes (Signed)
Patient complains of right ankle pain. She states she rolled her right ankle today in the kitchen.

## 2017-12-09 ENCOUNTER — Telehealth: Payer: Self-pay | Admitting: *Deleted

## 2017-12-09 DIAGNOSIS — S82891A Other fracture of right lower leg, initial encounter for closed fracture: Secondary | ICD-10-CM | POA: Diagnosis not present

## 2017-12-09 DIAGNOSIS — S89301A Unspecified physeal fracture of lower end of right fibula, initial encounter for closed fracture: Secondary | ICD-10-CM

## 2017-12-09 NOTE — Telephone Encounter (Signed)
Received call from patient.   States that over the weekend, she fell and sustained fracture to R ankle. Patient has appointment with Raliegh Ip on 12/09/2017 at 9:30am. States that she requires referral for appointment.   Provider made aware and approved referral.

## 2017-12-10 ENCOUNTER — Encounter (HOSPITAL_BASED_OUTPATIENT_CLINIC_OR_DEPARTMENT_OTHER): Payer: Self-pay | Admitting: *Deleted

## 2017-12-11 ENCOUNTER — Encounter (HOSPITAL_COMMUNITY): Payer: Medicaid Other

## 2017-12-14 ENCOUNTER — Other Ambulatory Visit: Payer: Self-pay

## 2017-12-14 ENCOUNTER — Encounter (HOSPITAL_COMMUNITY)
Admission: RE | Admit: 2017-12-14 | Discharge: 2017-12-14 | Disposition: A | Discharge status: Home / Self Care | Payer: Medicaid Other | Source: Ambulatory Visit | Attending: Orthopedic Surgery | Admitting: Orthopedic Surgery

## 2017-12-14 DIAGNOSIS — Z01812 Encounter for preprocedural laboratory examination: Secondary | ICD-10-CM | POA: Insufficient documentation

## 2017-12-14 DIAGNOSIS — I4581 Long QT syndrome: Secondary | ICD-10-CM | POA: Diagnosis not present

## 2017-12-14 DIAGNOSIS — I10 Essential (primary) hypertension: Secondary | ICD-10-CM | POA: Insufficient documentation

## 2017-12-14 DIAGNOSIS — Z01810 Encounter for preprocedural cardiovascular examination: Secondary | ICD-10-CM | POA: Diagnosis not present

## 2017-12-14 DIAGNOSIS — E119 Type 2 diabetes mellitus without complications: Secondary | ICD-10-CM | POA: Insufficient documentation

## 2017-12-14 LAB — BASIC METABOLIC PANEL
Anion gap: 11 (ref 5–15)
BUN: 16 mg/dL (ref 6–20)
CALCIUM: 9.4 mg/dL (ref 8.9–10.3)
CO2: 23 mmol/L (ref 22–32)
CREATININE: 0.77 mg/dL (ref 0.44–1.00)
Chloride: 104 mmol/L (ref 101–111)
GFR calc non Af Amer: >60 mL/min (ref >60)
Glucose, Bld: 156 mg/dL — ABNORMAL HIGH (ref 65–99)
Potassium: 3.5 mmol/L (ref 3.5–5.1)
SODIUM: 138 mmol/L (ref 135–145)

## 2017-12-14 NOTE — H&P (Signed)
[facility]  [address] , [place] [address] [phone]  RE: Harumi, Yamin   [id number]      DOB: 15-Jan-1966 INITIAL EVALUATION:  12-09-17  REASON FOR VISIT: New evaluation acute traumatic right ankle fracture 12-05-17.   HPI:   She slipped and fell at home externally rotating and everting her right ankle. She presented to the emergency department where X-rays showed a displaced spiral fracture of the distal fibula. She was referred for a follow-up. Today, her pain is fairly well controlled. The swelling is decreased slightly. No fevers or systemic symptoms.  Past medical history: Significant for diabetes, hypertension, COPD and asthma.  Social History: Nonsmoker. Denies a history of a DVT, PE, MI or CVA.   ALLERGIES: ASPIRIN CAUSES A RASH AROUND HER MOUTH.  EXAMINATION: Well appearing female no apparent distress. The right ankle is splinted, in good condition today. Moderate swelling in the foot and toes. Sensation intact distally.   IMAGES: X-rays reviewed by me:   Three views of her right ankle were reviewed and noted above.   ASSESSMENT & PLAN: Acute traumatic right ankle bimalleolar equivalent fracture 12-05-17. We discussed the risks and benefits of open reduction and internal fixation, lateral malleolus with possible syndesmosis repair. She verbalizes understanding and wishes to proceed. She will follow-up with [place] post operatively.    Ernesta Amble.  Percell Miller, M.D.  Electronically verified by Ernesta Amble. Percell Miller, M.D. dictated by Lemar Lofty, PA-C TDM: jgc D 12-10-17 T  12-11-17 cc:  Idamae Lusher, MD  fax [phone]

## 2017-12-15 ENCOUNTER — Encounter (HOSPITAL_BASED_OUTPATIENT_CLINIC_OR_DEPARTMENT_OTHER): Payer: Self-pay | Admitting: Anesthesiology

## 2017-12-15 ENCOUNTER — Other Ambulatory Visit: Payer: Self-pay

## 2017-12-15 ENCOUNTER — Ambulatory Visit (HOSPITAL_BASED_OUTPATIENT_CLINIC_OR_DEPARTMENT_OTHER): Payer: Medicaid Other | Admitting: Anesthesiology

## 2017-12-15 ENCOUNTER — Ambulatory Visit (HOSPITAL_BASED_OUTPATIENT_CLINIC_OR_DEPARTMENT_OTHER)
Admission: RE | Admit: 2017-12-15 | Discharge: 2017-12-15 | Disposition: A | Discharge status: Home / Self Care | Payer: Medicaid Other | Source: Ambulatory Visit | Attending: Orthopedic Surgery | Admitting: Orthopedic Surgery

## 2017-12-15 ENCOUNTER — Encounter (HOSPITAL_BASED_OUTPATIENT_CLINIC_OR_DEPARTMENT_OTHER)
Admission: RE | Disposition: A | Discharge status: Home / Self Care | Payer: Self-pay | Source: Ambulatory Visit | Attending: Orthopedic Surgery

## 2017-12-15 DIAGNOSIS — S82841A Displaced bimalleolar fracture of right lower leg, initial encounter for closed fracture: Secondary | ICD-10-CM | POA: Diagnosis present

## 2017-12-15 DIAGNOSIS — S8261XA Displaced fracture of lateral malleolus of right fibula, initial encounter for closed fracture: Secondary | ICD-10-CM | POA: Insufficient documentation

## 2017-12-15 DIAGNOSIS — J449 Chronic obstructive pulmonary disease, unspecified: Secondary | ICD-10-CM | POA: Diagnosis not present

## 2017-12-15 DIAGNOSIS — Z886 Allergy status to analgesic agent status: Secondary | ICD-10-CM | POA: Diagnosis not present

## 2017-12-15 DIAGNOSIS — Z79899 Other long term (current) drug therapy: Secondary | ICD-10-CM | POA: Diagnosis not present

## 2017-12-15 DIAGNOSIS — I10 Essential (primary) hypertension: Secondary | ICD-10-CM | POA: Diagnosis not present

## 2017-12-15 DIAGNOSIS — Z793 Long term (current) use of hormonal contraceptives: Secondary | ICD-10-CM | POA: Insufficient documentation

## 2017-12-15 DIAGNOSIS — S82891A Other fracture of right lower leg, initial encounter for closed fracture: Secondary | ICD-10-CM

## 2017-12-15 DIAGNOSIS — E119 Type 2 diabetes mellitus without complications: Secondary | ICD-10-CM | POA: Diagnosis not present

## 2017-12-15 DIAGNOSIS — W010XXA Fall on same level from slipping, tripping and stumbling without subsequent striking against object, initial encounter: Secondary | ICD-10-CM | POA: Insufficient documentation

## 2017-12-15 HISTORY — PX: ORIF ANKLE FRACTURE: SHX5408

## 2017-12-15 LAB — GLUCOSE, CAPILLARY
GLUCOSE-CAPILLARY: 153 mg/dL — AB (ref 65–99)
Glucose-Capillary: 143 mg/dL — ABNORMAL HIGH (ref 65–99)

## 2017-12-15 SURGERY — OPEN REDUCTION INTERNAL FIXATION (ORIF) ANKLE FRACTURE
Anesthesia: General | Site: Ankle | Laterality: Right

## 2017-12-15 MED ORDER — DEXAMETHASONE SODIUM PHOSPHATE 4 MG/ML IJ SOLN
INTRAMUSCULAR | Status: DC | PRN
  Administered 2017-12-15: 10 mg via INTRAVENOUS

## 2017-12-15 MED ORDER — MIDAZOLAM HCL 2 MG/2ML IJ SOLN
1.0000 mg | INTRAMUSCULAR | Status: DC | PRN
  Administered 2017-12-15: 2 mg via INTRAVENOUS

## 2017-12-15 MED ORDER — EPHEDRINE 5 MG/ML INJ
INTRAVENOUS | Status: AC
  Filled 2017-12-15: qty 10

## 2017-12-15 MED ORDER — DEXAMETHASONE SODIUM PHOSPHATE 10 MG/ML IJ SOLN
INTRAMUSCULAR | Status: AC
  Filled 2017-12-15: qty 1

## 2017-12-15 MED ORDER — MIDAZOLAM HCL 5 MG/5ML IJ SOLN
INTRAMUSCULAR | Status: DC | PRN
  Administered 2017-12-15: 2 mg via INTRAVENOUS

## 2017-12-15 MED ORDER — FENTANYL CITRATE (PF) 100 MCG/2ML IJ SOLN
INTRAMUSCULAR | Status: DC | PRN
  Administered 2017-12-15 (×2): 25 ug via INTRAVENOUS

## 2017-12-15 MED ORDER — ONDANSETRON HCL 4 MG/2ML IJ SOLN
INTRAMUSCULAR | Status: AC
  Filled 2017-12-15: qty 2

## 2017-12-15 MED ORDER — MIDAZOLAM HCL 2 MG/2ML IJ SOLN
INTRAMUSCULAR | Status: AC
  Filled 2017-12-15: qty 2

## 2017-12-15 MED ORDER — METHOCARBAMOL 500 MG PO TABS: 500.0000 mg | ORAL_TABLET | Freq: Three times a day (TID) | ORAL | 0 refills | Status: DC | PRN

## 2017-12-15 MED ORDER — PHENYLEPHRINE HCL 10 MG/ML IJ SOLN
INTRAMUSCULAR | Status: DC | PRN
  Administered 2017-12-15 (×5): 80 ug via INTRAVENOUS

## 2017-12-15 MED ORDER — FENTANYL CITRATE (PF) 100 MCG/2ML IJ SOLN
INTRAMUSCULAR | Status: AC
  Filled 2017-12-15: qty 2

## 2017-12-15 MED ORDER — CEFAZOLIN SODIUM-DEXTROSE 2-4 GM/100ML-% IV SOLN
2.0000 g | INTRAVENOUS | Status: AC
  Administered 2017-12-15: 2 g via INTRAVENOUS

## 2017-12-15 MED ORDER — LIDOCAINE HCL (CARDIAC) 20 MG/ML IV SOLN
INTRAVENOUS | Status: DC | PRN
  Administered 2017-12-15: 30 mg via INTRAVENOUS

## 2017-12-15 MED ORDER — ONDANSETRON HCL 4 MG/2ML IJ SOLN: 4.0000 mg | Freq: Once | INTRAMUSCULAR | Status: DC | PRN

## 2017-12-15 MED ORDER — APIXABAN 2.5 MG PO TABS: 2.5000 mg | ORAL_TABLET | Freq: Two times a day (BID) | ORAL | 0 refills | Status: DC

## 2017-12-15 MED ORDER — LIDOCAINE 2% (20 MG/ML) 5 ML SYRINGE
INTRAMUSCULAR | Status: AC
  Filled 2017-12-15: qty 5

## 2017-12-15 MED ORDER — LACTATED RINGERS IV SOLN
INTRAVENOUS | Status: DC
  Administered 2017-12-15 (×2): via INTRAVENOUS

## 2017-12-15 MED ORDER — DOCUSATE SODIUM 100 MG PO CAPS: 100.0000 mg | ORAL_CAPSULE | Freq: Two times a day (BID) | ORAL | 0 refills | Status: DC

## 2017-12-15 MED ORDER — PROPOFOL 10 MG/ML IV BOLUS
INTRAVENOUS | Status: DC | PRN
  Administered 2017-12-15: 130 mg via INTRAVENOUS

## 2017-12-15 MED ORDER — BUPIVACAINE-EPINEPHRINE (PF) 0.5% -1:200000 IJ SOLN
INTRAMUSCULAR | Status: DC | PRN
  Administered 2017-12-15: 30 mL via PERINEURAL

## 2017-12-15 MED ORDER — CEFAZOLIN SODIUM-DEXTROSE 2-4 GM/100ML-% IV SOLN
INTRAVENOUS | Status: AC
  Filled 2017-12-15: qty 100

## 2017-12-15 MED ORDER — ACETAMINOPHEN 500 MG PO TABS
ORAL_TABLET | ORAL | Status: AC
  Filled 2017-12-15: qty 2

## 2017-12-15 MED ORDER — ONDANSETRON HCL 4 MG PO TABS: 4.0000 mg | ORAL_TABLET | Freq: Three times a day (TID) | ORAL | 0 refills | Status: DC | PRN

## 2017-12-15 MED ORDER — PROPOFOL 10 MG/ML IV BOLUS
INTRAVENOUS | Status: AC
  Filled 2017-12-15: qty 20

## 2017-12-15 MED ORDER — OXYCODONE HCL 5 MG PO TABS: 5.0000 mg | ORAL_TABLET | ORAL | 0 refills | Status: AC | PRN

## 2017-12-15 MED ORDER — FENTANYL CITRATE (PF) 100 MCG/2ML IJ SOLN: 25.0000 ug | INTRAMUSCULAR | Status: DC | PRN

## 2017-12-15 MED ORDER — CHLORHEXIDINE GLUCONATE 4 % EX LIQD: 60.0000 mL | Freq: Once | CUTANEOUS | Status: DC

## 2017-12-15 MED ORDER — ACETAMINOPHEN 500 MG PO TABS
1000.0000 mg | ORAL_TABLET | Freq: Once | ORAL | Status: AC
  Administered 2017-12-15: 1000 mg via ORAL

## 2017-12-15 MED ORDER — PHENYLEPHRINE 40 MCG/ML (10ML) SYRINGE FOR IV PUSH (FOR BLOOD PRESSURE SUPPORT)
PREFILLED_SYRINGE | INTRAVENOUS | Status: AC
  Filled 2017-12-15: qty 10

## 2017-12-15 MED ORDER — FENTANYL CITRATE (PF) 100 MCG/2ML IJ SOLN
50.0000 ug | INTRAMUSCULAR | Status: DC | PRN
  Administered 2017-12-15: 50 ug via INTRAVENOUS

## 2017-12-15 MED ORDER — ACETAMINOPHEN 500 MG PO TABS: 1000.0000 mg | ORAL_TABLET | Freq: Three times a day (TID) | ORAL | 0 refills | Status: AC

## 2017-12-15 MED ORDER — LACTATED RINGERS IV SOLN
INTRAVENOUS | Status: DC
  Administered 2017-12-15: 08:00:00 via INTRAVENOUS

## 2017-12-15 MED ORDER — SCOPOLAMINE 1 MG/3DAYS TD PT72: 1.0000 | MEDICATED_PATCH | Freq: Once | TRANSDERMAL | Status: DC | PRN

## 2017-12-15 MED ORDER — EPHEDRINE SULFATE 50 MG/ML IJ SOLN
INTRAMUSCULAR | Status: DC | PRN
  Administered 2017-12-15: 10 mg via INTRAVENOUS

## 2017-12-15 SURGICAL SUPPLY — 78 items
BANDAGE ACE 4X5 VEL STRL LF (GAUZE/BANDAGES/DRESSINGS) ×3 IMPLANT
BANDAGE ACE 6X5 VEL STRL LF (GAUZE/BANDAGES/DRESSINGS) ×3 IMPLANT
BANDAGE ESMARK 6X9 LF (GAUZE/BANDAGES/DRESSINGS) ×1 IMPLANT
BIT DRILL 2.5X125 (BIT) ×2 IMPLANT
BIT DRILL 3.5X125 (BIT) ×1 IMPLANT
BIT DRILL COUNTER SINK (DRILL) ×1 IMPLANT
BLADE SURG 15 STRL LF DISP TIS (BLADE) ×2 IMPLANT
BLADE SURG 15 STRL SS (BLADE) ×6
BNDG CMPR 9X6 STRL LF SNTH (GAUZE/BANDAGES/DRESSINGS) ×1
BNDG COHESIVE 4X5 TAN STRL (GAUZE/BANDAGES/DRESSINGS) ×3 IMPLANT
BNDG ESMARK 6X9 LF (GAUZE/BANDAGES/DRESSINGS) ×3
CHLORAPREP W/TINT 26ML (MISCELLANEOUS) ×3 IMPLANT
CLOSURE STERI-STRIP 1/2X4 (GAUZE/BANDAGES/DRESSINGS) ×1
CLSR STERI-STRIP ANTIMIC 1/2X4 (GAUZE/BANDAGES/DRESSINGS) ×2 IMPLANT
COVER BACK TABLE 60X90IN (DRAPES) ×3 IMPLANT
CUFF TOURNIQUET SINGLE 24IN (TOURNIQUET CUFF) IMPLANT
CUFF TOURNIQUET SINGLE 34IN LL (TOURNIQUET CUFF) ×3 IMPLANT
DECANTER SPIKE VIAL GLASS SM (MISCELLANEOUS) IMPLANT
DRAPE EXTREMITY T 121X128X90 (DRAPE) ×3 IMPLANT
DRAPE IMP U-DRAPE 54X76 (DRAPES) ×3 IMPLANT
DRAPE OEC MINIVIEW 54X84 (DRAPES) ×3 IMPLANT
DRAPE U-SHAPE 47X51 STRL (DRAPES) ×3 IMPLANT
DRILL BIT 3.5X125 (BIT) ×3
DRILL COUNTER SINK (DRILL) ×3
DRSG EMULSION OIL 3X3 NADH (GAUZE/BANDAGES/DRESSINGS) ×3 IMPLANT
DRSG PAD ABDOMINAL 8X10 ST (GAUZE/BANDAGES/DRESSINGS) ×3 IMPLANT
ELECT REM PT RETURN 9FT ADLT (ELECTROSURGICAL) ×3
ELECTRODE REM PT RTRN 9FT ADLT (ELECTROSURGICAL) ×1 IMPLANT
GAUZE SPONGE 4X4 12PLY STRL (GAUZE/BANDAGES/DRESSINGS) ×3 IMPLANT
GLOVE BIO SURGEON STRL SZ7.5 (GLOVE) IMPLANT
GLOVE BIOGEL PI IND STRL 7.0 (GLOVE) IMPLANT
GLOVE BIOGEL PI IND STRL 8 (GLOVE) ×2 IMPLANT
GLOVE BIOGEL PI INDICATOR 7.0 (GLOVE) ×4
GLOVE BIOGEL PI INDICATOR 8 (GLOVE) ×4
GLOVE SURG SS PI 7.0 STRL IVOR (GLOVE) ×3 IMPLANT
GOWN STRL REUS W/ TWL LRG LVL3 (GOWN DISPOSABLE) ×2 IMPLANT
GOWN STRL REUS W/ TWL XL LVL3 (GOWN DISPOSABLE) ×1 IMPLANT
GOWN STRL REUS W/TWL LRG LVL3 (GOWN DISPOSABLE) ×6
GOWN STRL REUS W/TWL XL LVL3 (GOWN DISPOSABLE) ×3
NEEDLE HYPO 22GX1.5 SAFETY (NEEDLE) IMPLANT
NS IRRIG 1000ML POUR BTL (IV SOLUTION) ×3 IMPLANT
PACK BASIN DAY SURGERY FS (CUSTOM PROCEDURE TRAY) ×3 IMPLANT
PAD CAST 4YDX4 CTTN HI CHSV (CAST SUPPLIES) ×1 IMPLANT
PADDING CAST ABS 4INX4YD NS (CAST SUPPLIES) ×4
PADDING CAST ABS COTTON 4X4 ST (CAST SUPPLIES) ×2 IMPLANT
PADDING CAST COTTON 4X4 STRL (CAST SUPPLIES) ×3
PADDING CAST COTTON 6X4 STRL (CAST SUPPLIES) ×3 IMPLANT
PENCIL BUTTON HOLSTER BLD 10FT (ELECTRODE) ×3 IMPLANT
PLATE TUBUAL 1/3 6H (Plate) ×2 IMPLANT
SCREW CANC 2.5XFT HEX12X4X (Screw) IMPLANT
SCREW CANC FT 16X4X2.5XHEX (Screw) ×1 IMPLANT
SCREW CANCELLOUS 4.0X12MM (Screw) ×3 IMPLANT
SCREW CANCELLOUS 4.0X14 (Screw) ×2 IMPLANT
SCREW CANCELLOUS 4.0X16MM (Screw) ×3 IMPLANT
SCREW CORTEX ST MATTA 3.5X14 (Screw) ×4 IMPLANT
SCREW CORTEX ST MATTA 3.5X16MM (Screw) ×3 IMPLANT
SCREW CORTEX ST MATTA 3.5X22MM (Screw) ×2 IMPLANT
SLEEVE SCD COMPRESS KNEE MED (MISCELLANEOUS) ×2 IMPLANT
SPLINT FAST PLASTER 5X30 (CAST SUPPLIES) ×40
SPLINT PLASTER CAST FAST 5X30 (CAST SUPPLIES) ×20 IMPLANT
SPONGE LAP 4X18 X RAY DECT (DISPOSABLE) ×3 IMPLANT
SUCTION FRAZIER HANDLE 10FR (MISCELLANEOUS) ×2
SUCTION TUBE FRAZIER 10FR DISP (MISCELLANEOUS) ×1 IMPLANT
SUT ETHILON 3 0 PS 1 (SUTURE) IMPLANT
SUT MNCRL AB 4-0 PS2 18 (SUTURE) ×2 IMPLANT
SUT MON AB 2-0 CT1 36 (SUTURE) ×2 IMPLANT
SUT MON AB 3-0 SH 27 (SUTURE)
SUT MON AB 3-0 SH27 (SUTURE) IMPLANT
SUT VIC AB 0 SH 27 (SUTURE) ×3 IMPLANT
SUT VIC AB 2-0 SH 27 (SUTURE)
SUT VIC AB 2-0 SH 27XBRD (SUTURE) IMPLANT
SYR BULB 3OZ (MISCELLANEOUS) ×3 IMPLANT
SYR CONTROL 10ML LL (SYRINGE) IMPLANT
TOWEL OR 17X24 6PK STRL BLUE (TOWEL DISPOSABLE) ×6 IMPLANT
TOWEL OR NON WOVEN STRL DISP B (DISPOSABLE) ×3 IMPLANT
TUBE CONNECTING 20'X1/4 (TUBING) ×1
TUBE CONNECTING 20X1/4 (TUBING) ×2 IMPLANT
UNDERPAD 30X30 (UNDERPADS AND DIAPERS) ×3 IMPLANT

## 2017-12-15 NOTE — Anesthesia Procedure Notes (Signed)
Anesthesia Regional Block: Adductor canal block   Pre-Anesthetic Checklist: ,, timeout performed, Correct Patient, Correct Site, Correct Laterality, Correct Procedure, Correct Position, site marked, Risks and benefits discussed,  Surgical consent,  Pre-op evaluation,  At surgeon's request and post-op pain management  Laterality: Right  Prep: chloraprep       Needles:  Injection technique: Single-shot  Needle Type: Echogenic Needle     Needle Length: 9cm  Needle Gauge: 21     Additional Needles:   Procedures:,,,, ultrasound used (permanent image in chart),,,,  Narrative:  Start time: 12/15/2017 8:30 AM End time: 12/15/2017 8:35 AM Injection made incrementally with aspirations every 5 mL.  Performed by: Personally  Anesthesiologist: Catalina Gravel, MD  Additional Notes: No pain on injection. No increased resistance to injection. Injection made in 5cc increments.  Good needle visualization.  Patient tolerated procedure well.  Combined right popliteal/saphneous nerve block.

## 2017-12-15 NOTE — Anesthesia Preprocedure Evaluation (Signed)
Anesthesia Evaluation  Patient identified by MRN, date of birth, ID band Patient awake    Reviewed: Allergy & Precautions, NPO status , Patient's Chart, lab work & pertinent test results  Airway Mallampati: II  TM Distance: >3 FB Neck ROM: Full    Dental  (+) Teeth Intact, Dental Advisory Given   Pulmonary asthma , COPD,  COPD inhaler,    Pulmonary exam normal breath sounds clear to auscultation       Cardiovascular hypertension, Pt. on medications Normal cardiovascular exam Rhythm:Regular Rate:Normal     Neuro/Psych  Headaches, PSYCHIATRIC DISORDERS Anxiety Depression    GI/Hepatic Neg liver ROS, GERD  ,  Endo/Other  diabetes, Type 2, Oral Hypoglycemic AgentsObesity   Renal/GU negative Renal ROS     Musculoskeletal negative musculoskeletal ROS (+)   Abdominal   Peds  (+) ATTENTION DEFICIT DISORDER WITHOUT HYPERACTIVITY Hematology negative hematology ROS (+)   Anesthesia Other Findings Day of surgery medications reviewed with the patient.  Reproductive/Obstetrics                             Anesthesia Physical Anesthesia Plan  ASA: III  Anesthesia Plan: General   Post-op Pain Management:  Regional for Post-op pain   Induction: Intravenous  PONV Risk Score and Plan: 3 and Ondansetron, Midazolam and Dexamethasone  Airway Management Planned: LMA  Additional Equipment:   Intra-op Plan:   Post-operative Plan: Extubation in OR  Informed Consent: I have reviewed the patients History and Physical, chart, labs and discussed the procedure including the risks, benefits and alternatives for the proposed anesthesia with the patient or authorized representative who has indicated his/her understanding and acceptance.   Dental advisory given  Plan Discussed with: CRNA  Anesthesia Plan Comments: (Risks/benefits of general anesthesia discussed with patient including risk of damage to  teeth, lips, gum, and tongue, nausea/vomiting, allergic reactions to medications, and the possibility of heart attack, stroke and death.  All patient questions answered.  Patient wishes to proceed.)        Anesthesia Quick Evaluation

## 2017-12-15 NOTE — Discharge Instructions (Signed)
Elevate leg - Toes above nose as much as possible to reduce pain / swelling.  You may loosen and re-apply ace wrap if it feels too tight.  Weight Bearing:  Non weight bearing affected leg.  Diet: As you were doing prior to hospitalization   Shower:  You have a splint on, leave the splint in place and keep the splint dry with a plastic bag.  Dressing:  You have a splint. Leave the splint in place and we will change your bandages during your first follow-up appointment.    Activity:  Increase activity slowly as tolerated, but follow the weight bearing instructions below.  The rules on driving is that you can not be taking narcotics while you drive, and you must feel in control of the vehicle.    To prevent constipation:  Narcotic medicines cause constipation.  Wean these as soon as is appropriate.   You may use a stool softener such as -  Colace (over the counter) 100 mg by mouth twice a day  Drink plenty of fluids (prune juice may be helpful) and high fiber foods Miralax (over the counter) for constipation as needed.    Itching:  If you experience itching with your medications, try taking only a single pain pill, or even half a pain pill at a time.  You can also use benadryl over the counter for itching or also to help with sleep.   Precautions:  If you experience chest pain or shortness of breath - call 911 immediately for transfer to the hospital emergency department!!  If you develop a fever greater that 101 F, purulent drainage from wound, increased redness or drainage from wound, or calf pain -- Call the office at [phone]                                                 Follow- Up Appointment:  Please call for an appointment to be seen in 2 weeks [place] - [phone]  Regional Anesthesia Blocks  1. Numbness or the inability to move the "blocked" extremity may last from 3-48 hours after placement. The length of time depends on the medication injected and your individual  response to the medication. If the numbness is not going away after 48 hours, call your surgeon.  2. The extremity that is blocked will need to be protected until the numbness is gone and the  Strength has returned. Because you cannot feel it, you will need to take extra care to avoid injury. Because it may be weak, you may have difficulty moving it or using it. You may not know what position it is in without looking at it while the block is in effect.  3. For blocks in the legs and feet, returning to weight bearing and walking needs to be done carefully. You will need to wait until the numbness is entirely gone and the strength has returned. You should be able to move your leg and foot normally before you try and bear weight or walk. You will need someone to be with you when you first try to ensure you do not fall and possibly risk injury.  4. Bruising and tenderness at the needle site are common side effects and will resolve in a few days.  5. Persistent numbness or new problems with movement should be communicated to the surgeon or the  [place] [phone] [place] [phone]).   Post Anesthesia Home Care Instructions  Activity: Get plenty of rest for the remainder of the day. A responsible individual must stay with you for 24 hours following the procedure.  For the next 24 hours, DO NOT: -Drive a car -Paediatric nurse -Drink alcoholic beverages -Take any medication unless instructed by your physician -Make any legal decisions or sign important papers.  Meals: Start with liquid foods such as gelatin or soup. Progress to regular foods as tolerated. Avoid greasy, spicy, heavy foods. If nausea and/or vomiting occur, drink only clear liquids until the nausea and/or vomiting subsides. Call your physician if vomiting continues.  Special Instructions/Symptoms: Your throat may feel dry or sore from the anesthesia or the breathing tube placed in your throat  during surgery. If this causes discomfort, gargle with warm salt water. The discomfort should disappear within 24 hours.  If you had a scopolamine patch placed behind your ear for the management of post- operative nausea and/or vomiting:  1. The medication in the patch is effective for 72 hours, after which it should be removed.  Wrap patch in a tissue and discard in the trash. Wash hands thoroughly with soap and water. 2. You may remove the patch earlier than 72 hours if you experience unpleasant side effects which may include dry mouth, dizziness or visual disturbances. 3. Avoid touching the patch. Wash your hands with soap and water after contact with the patch.     Regional Anesthesia Blocks  1. Numbness or the inability to move the "blocked" extremity may last from 3-48 hours after placement. The length of time depends on the medication injected and your individual response to the medication. If the numbness is not going away after 48 hours, call your surgeon.  2. The extremity that is blocked will need to be protected until the numbness is gone and the  Strength has returned. Because you cannot feel it, you will need to take extra care to avoid injury. Because it may be weak, you may have difficulty moving it or using it. You may not know what position it is in without looking at it while the block is in effect.  3. For blocks in the legs and feet, returning to weight bearing and walking needs to be done carefully. You will need to wait until the numbness is entirely gone and the strength has returned. You should be able to move your leg and foot normally before you try and bear weight or walk. You will need someone to be with you when you first try to ensure you do not fall and possibly risk injury.  4. Bruising and tenderness at the needle site are common side effects and will resolve in a few days.  5. Persistent numbness or new problems with movement should be communicated to the  surgeon or the [place] [phone] [place] [phone]).

## 2017-12-15 NOTE — Anesthesia Postprocedure Evaluation (Signed)
Anesthesia Post Note  Patient: Kayla Choi  Procedure(s) Performed: OPEN REDUCTION INTERNAL FIXATION (ORIF) RIGHT ANKLE FRACTURE (Right Ankle)     Patient location during evaluation: PACU Anesthesia Type: General Level of consciousness: awake and alert Pain management: pain level controlled Vital Signs Assessment: post-procedure vital signs reviewed and stable Respiratory status: spontaneous breathing, nonlabored ventilation and respiratory function stable Cardiovascular status: blood pressure returned to baseline and stable Postop Assessment: no apparent nausea or vomiting Anesthetic complications: no    Last Vitals:  Vitals:   12/15/17 1115 12/15/17 1130  BP: (!) 141/93 133/78  Pulse: 95 (!) 108  Resp: 17 18  Temp:  36.5 C  SpO2: 100% 97%    Last Pain:  Vitals:   12/15/17 1130  TempSrc: Oral  PainSc: 0-No pain                 Catalina Gravel

## 2017-12-15 NOTE — Op Note (Signed)
12/15/2017  11:59 AM  PATIENT:  Kayla Choi    PRE-OPERATIVE DIAGNOSIS:  RIGHT ANKLE FRACTURE  POST-OPERATIVE DIAGNOSIS:  Same  PROCEDURE:  OPEN REDUCTION INTERNAL FIXATION (ORIF) RIGHT ANKLE FRACTURE  SURGEON:  Leeandra Ellerson, Ernesta Amble, MD  ASSISTANT: Roxan Hockey, PA-C, he was present and scrubbed throughout the case, critical for completion in a timely fashion, and for retraction, instrumentation, and closure.   ANESTHESIA:   gen  PREOPERATIVE INDICATIONS:  Kayla Choi is a  52 y.o. female with a diagnosis of RIGHT ANKLE FRACTURE who failed conservative measures and elected for surgical management.    The risks benefits and alternatives were discussed with the patient preoperatively including but not limited to the risks of infection, bleeding, nerve injury, cardiopulmonary complications, the need for revision surgery, among others, and the patient was willing to proceed.  OPERATIVE IMPLANTS: stryker lateral plate  OPERATIVE FINDINGS: Unstable ankle fracture. Stable syndesmosis post op  BLOOD LOSS: min  COMPLICATIONS: none  TOURNIQUET TIME: 78min  OPERATIVE PROCEDURE:  Patient was identified in the preoperative holding area and site was marked by me He was transported to the operating theater and placed on the table in supine position taking care to pad all bony prominences. After a preincinduction time out anesthesia was induced. The right lower extremity was prepped and draped in normal sterile fashion and a pre-incision timeout was performed. Kayla Choi received ancef for preoperative antibiotics.   I made a lateral incision of roughly 7 cm dissection was carried down sharply to the distal fibula and then spreading dissection was used proximally to protect the superficial peroneal nerve. I sharply incised the periosteum and took care to protect the peroneal tendons. I then debrided the fracture site and performed a reduction maneuver which was held in place with a  clamp.   I placed a lag screw across the fracture  I then selected a 6-hole one third tubular plate and placed in a neutralization fashion care was taken distally so as not to penetrate the joint with the cancellus screws.  I then stressed the syndesmosis andit was stable  The wound was then thoroughly irrigated and closed using a 0 Vicryl and absorbable Monocryl sutures. He was placed in a short leg splint.   POST OPERATIVE PLAN: Non-weightbearing. DVT prophylaxis will consist of ASA and mobilization

## 2017-12-15 NOTE — Progress Notes (Signed)
Assisted Dr. Turk with right, ultrasound guided, popliteal/saphenous block. Side rails up, monitors on throughout procedure. See vital signs in flow sheet. Tolerated Procedure well. 

## 2017-12-15 NOTE — Transfer of Care (Signed)
Immediate Anesthesia Transfer of Care Note  Patient: Shadow Stiggers  Procedure(s) Performed: OPEN REDUCTION INTERNAL FIXATION (ORIF) RIGHT ANKLE FRACTURE (Right Ankle)  Patient Location: PACU  Anesthesia Type:General and GA combined with regional for post-op pain  Level of Consciousness: sedated  Airway & Oxygen Therapy: Patient Spontanous Breathing and Patient connected to face mask oxygen  Post-op Assessment: Report given to RN and Post -op Vital signs reviewed and stable  Post vital signs: Reviewed and stable  Last Vitals:  Vitals:   12/15/17 0900 12/15/17 0905  BP:    Pulse: 88 83  Resp: 16 20  Temp:    SpO2: 100% 100%    Last Pain:  Vitals:   12/15/17 0839  TempSrc:   PainSc: 0-No pain      Patients Stated Pain Goal: 3 (56/31/49 7026)  Complications: No apparent anesthesia complications

## 2017-12-15 NOTE — Interval H&P Note (Signed)
History and Physical Interval Note:  12/15/2017 9:08 AM  Kayla Choi  has presented today for surgery, with the diagnosis of RIGHT ANKLE FRACTURE  The various methods of treatment have been discussed with the patient and family. After consideration of risks, benefits and other options for treatment, the patient has consented to  Procedure(s): OPEN REDUCTION INTERNAL FIXATION (ORIF) RIGHT ANKLE FRACTURE (Right) as a surgical intervention .  The patient's history has been reviewed, patient examined, no change in status, stable for surgery.  I have reviewed the patient's chart and labs.  Questions were answered to the patient's satisfaction.     MURPHY, TIMOTHY D

## 2017-12-15 NOTE — Anesthesia Procedure Notes (Signed)
Procedure Name: LMA Insertion Date/Time: 12/15/2017 9:13 AM Performed by: Marrianne Mood, CRNA Pre-anesthesia Checklist: Patient identified, Emergency Drugs available, Suction available, Patient being monitored and Timeout performed Patient Re-evaluated:Patient Re-evaluated prior to induction Oxygen Delivery Method: Circle system utilized Preoxygenation: Pre-oxygenation with 100% oxygen Induction Type: IV induction Ventilation: Mask ventilation without difficulty LMA: LMA inserted LMA Size: 4.0 Number of attempts: 1 Airway Equipment and Method: Bite block Placement Confirmation: positive ETCO2 Tube secured with: Tape Dental Injury: Teeth and Oropharynx as per pre-operative assessment

## 2017-12-17 ENCOUNTER — Encounter (HOSPITAL_BASED_OUTPATIENT_CLINIC_OR_DEPARTMENT_OTHER): Payer: Self-pay | Admitting: Orthopedic Surgery

## 2018-01-01 ENCOUNTER — Ambulatory Visit: Payer: Medicaid Other | Admitting: Family Medicine

## 2018-01-01 ENCOUNTER — Encounter: Payer: Self-pay | Admitting: Family Medicine

## 2018-01-01 ENCOUNTER — Other Ambulatory Visit: Payer: Self-pay

## 2018-01-01 VITALS — BP 132/80 | HR 80 | Temp 98.1°F | Resp 16 | Ht 60.0 in | Wt 174.0 lb

## 2018-01-01 DIAGNOSIS — R42 Dizziness and giddiness: Secondary | ICD-10-CM

## 2018-01-01 DIAGNOSIS — E119 Type 2 diabetes mellitus without complications: Secondary | ICD-10-CM | POA: Diagnosis not present

## 2018-01-01 DIAGNOSIS — I10 Essential (primary) hypertension: Secondary | ICD-10-CM

## 2018-01-01 DIAGNOSIS — J410 Simple chronic bronchitis: Secondary | ICD-10-CM | POA: Diagnosis not present

## 2018-01-01 DIAGNOSIS — R296 Repeated falls: Secondary | ICD-10-CM

## 2018-01-01 DIAGNOSIS — E782 Mixed hyperlipidemia: Secondary | ICD-10-CM | POA: Diagnosis not present

## 2018-01-01 DIAGNOSIS — R29818 Other symptoms and signs involving the nervous system: Secondary | ICD-10-CM

## 2018-01-01 MED ORDER — TIOTROPIUM BROMIDE MONOHYDRATE 2.5 MCG/ACT IN AERS: INHALATION_SPRAY | RESPIRATORY_TRACT | 3 refills | Status: DC

## 2018-01-01 NOTE — Patient Instructions (Addendum)
CT of head to be done for recurrent falls/dizziness  We will call with lab results  F/U in 4 MONTHS

## 2018-01-01 NOTE — Progress Notes (Addendum)
   Subjective:    Patient ID: Kayla Choi, female    DOB: 1966-01-07, 52 y.o.   MRN: [id number]  Patient presents for Follow-up (broke ankle)  Pt here for f/u she fell in December and broke her right ankle, currently in boot by orthopedics after ORIF performed on Jan 8th by Dr, Percell Miller. She is on oxycodone by orthopdics and robaxin  Has f/u Feb 13th   DM- last A1C  6.6%, taking Metformin as prescribed ranging 130-169, trying to change diet   Hyperlipidemia on crestor 20mg  daily   HTN- taking norvasc without difficulty   Continues to have falls per above resulted in injury, also gets the dizzy spells. Now has metal in leg unable to get MRI, will obtain CT of head instead    Review Of Systems:  GEN- denies fatigue, fever, weight loss,weakness, recent illness HEENT- denies eye drainage, change in vision, nasal discharge, CVS- denies chest pain, palpitations RESP- denies SOB, cough, wheeze ABD- denies N/V, change in stools, abd pain GU- denies dysuria, hematuria, dribbling, incontinence MSK- + joint pain, muscle aches, injury Neuro- denies headache,+ dizziness, syncope, seizure activity       Objective:    BP 132/80   Pulse 80   Temp 98.1 F (36.7 C) (Oral)   Resp 16   Ht 5' (1.524 m)   Wt 174 lb (78.9 kg)   SpO2 98%   BMI 33.98 kg/m  GEN- NAD, alert and oriented x3 HEENT- PERRL, EOMI, non injected sclera, pink conjunctiva, MMM, oropharynx clear Neck- Supple, no thyromegaly CVS- RRR, no murmur RESP-CTAB EXT- No edema, RIght foot in boot  Pulses- Radial, DP- 2+        Assessment & Plan:      Problem List Items Addressed This Visit      Unprioritized   Recurrent falls   Hyperlipidemia   Relevant Orders   Lipid panel (Completed)   Essential hypertension    Well controlled       Diabetes mellitus, type II ([place]) - Primary    Has been well controlled no change to meds Check lipids       Relevant Orders   CBC with Differential/Platelet (Completed)    Hemoglobin A1c (Completed)   COPD (chronic obstructive pulmonary disease) (HCC)    Doing well on medications, dicussed preventative to be used daily      Relevant Medications   Tiotropium Bromide Monohydrate (SPIRIVA RESPIMAT) 2.5 MCG/ACT AERS    Other Visit Diagnoses    Dizziness          Note: This dictation was prepared with Dragon dictation along with smaller phrase technology. Any transcriptional errors that result from this process are unintentional.

## 2018-01-02 ENCOUNTER — Encounter: Payer: Self-pay | Admitting: Family Medicine

## 2018-01-02 LAB — CBC WITH DIFFERENTIAL/PLATELET
BASOS PCT: 0.9 %
Basophils Absolute: 41 cells/uL (ref 0–200)
EOS PCT: 1.5 %
Eosinophils Absolute: 69 cells/uL (ref 15–500)
HCT: 40.1 % (ref 35.0–45.0)
HEMOGLOBIN: 13.2 g/dL (ref 11.7–15.5)
Lymphs Abs: 1785 cells/uL (ref 850–3900)
MCH: 30.6 pg (ref 27.0–33.0)
MCHC: 32.9 g/dL (ref 32.0–36.0)
MCV: 92.8 fL (ref 80.0–100.0)
MPV: 11.1 fL (ref 7.5–12.5)
Monocytes Relative: 7.5 %
NEUTROS ABS: 2360 {cells}/uL (ref 1500–7800)
Neutrophils Relative %: 51.3 %
Platelets: 410 10*3/uL — ABNORMAL HIGH (ref 140–400)
RBC: 4.32 10*6/uL (ref 3.80–5.10)
RDW: 12 % (ref 11.0–15.0)
Total Lymphocyte: 38.8 %
WBC mixed population: 345 cells/uL (ref 200–950)
WBC: 4.6 10*3/uL (ref 3.8–10.8)

## 2018-01-02 LAB — LIPID PANEL
CHOL/HDL RATIO: 3.8 (calc) (ref <5.0)
CHOLESTEROL: 235 mg/dL — AB (ref <200)
HDL: 62 mg/dL (ref >50)
LDL CHOLESTEROL (CALC): 154 mg/dL — AB
Non-HDL Cholesterol (Calc): 173 mg/dL (calc) — ABNORMAL HIGH (ref <130)
Triglycerides: 84 mg/dL (ref <150)

## 2018-01-02 LAB — HEMOGLOBIN A1C
HEMOGLOBIN A1C: 6.8 %{Hb} — AB (ref <5.7)
Mean Plasma Glucose: 148 (calc)
eAG (mmol/L): 8.2 (calc)

## 2018-01-02 NOTE — Assessment & Plan Note (Signed)
Has been well controlled no change to meds Check lipids

## 2018-01-02 NOTE — Assessment & Plan Note (Signed)
Well controlled 

## 2018-01-02 NOTE — Assessment & Plan Note (Signed)
Doing well on medications, dicussed preventative to be used daily

## 2018-01-05 ENCOUNTER — Telehealth: Payer: Self-pay

## 2018-01-06 NOTE — Telephone Encounter (Signed)
Patient called 01/05/2018 complaining of blood in stool. Patient states she has been constipated and noticed a little blood in stool not a lot just a little. Patient was advised that by her being constipated she could notice a little blood when she have a bowel movement and it is hard, however patient was informed that it is not normal for this to happen each time she uses the restroom and that if she continues to notice blood when she have a bowel movement then call  to schedule an office visit.Patient verbalized understanding

## 2018-01-08 ENCOUNTER — Other Ambulatory Visit: Payer: Self-pay | Admitting: *Deleted

## 2018-01-08 MED ORDER — ROSUVASTATIN CALCIUM 40 MG PO TABS: 40.0000 mg | ORAL_TABLET | Freq: Every day | ORAL | 3 refills | Status: DC

## 2018-01-20 DIAGNOSIS — S82841D Displaced bimalleolar fracture of right lower leg, subsequent encounter for closed fracture with routine healing: Secondary | ICD-10-CM | POA: Diagnosis not present

## 2018-02-10 ENCOUNTER — Telehealth: Payer: Self-pay | Admitting: *Deleted

## 2018-02-10 NOTE — Telephone Encounter (Signed)
Received call from patient.   Reports that she received letter stating that CT scan was not approved and is inquiring as to next steps.   MD please advise.

## 2018-02-10 NOTE — Telephone Encounter (Signed)
Kayla Beach, Do you know what happed after the denial, I gave you more information about the patient  also has metal, so cant get MRI

## 2018-02-22 ENCOUNTER — Encounter (HOSPITAL_COMMUNITY): Payer: Self-pay | Admitting: Physical Therapy

## 2018-02-22 ENCOUNTER — Ambulatory Visit (HOSPITAL_COMMUNITY): Payer: Medicaid Other | Attending: Orthopedic Surgery | Admitting: Physical Therapy

## 2018-02-22 ENCOUNTER — Other Ambulatory Visit: Payer: Self-pay

## 2018-02-22 DIAGNOSIS — R262 Difficulty in walking, not elsewhere classified: Secondary | ICD-10-CM | POA: Insufficient documentation

## 2018-02-22 DIAGNOSIS — M25671 Stiffness of right ankle, not elsewhere classified: Secondary | ICD-10-CM | POA: Diagnosis present

## 2018-02-22 DIAGNOSIS — M25571 Pain in right ankle and joints of right foot: Secondary | ICD-10-CM | POA: Diagnosis present

## 2018-02-22 NOTE — Therapy (Signed)
[facility] [place], [place], [address] Phone: [phone]   Fax:  [phone]  Physical Therapy Evaluation  Patient Details  Name: Kayla Choi MRN: [id number] Date of Birth: 07/03/1966 Referring Provider: Edmonia Lynch   Encounter Date: 02/22/2018  PT End of Session - 02/22/18 1301    Visit Number  0    Number of Visits  18    Date for PT Re-Evaluation  04/12/18 mini reassess at 4/12     Authorization - Visit Number  0    Authorization - Number of Visits  18    PT Start Time  0905    PT Stop Time  0940    PT Time Calculation (min)  35 min    Activity Tolerance  Patient tolerated treatment well    Behavior During Therapy  [facility] for tasks assessed/performed       Past Medical History:  Diagnosis Date  . Acid reflux   . ADD (attention deficit disorder)   . Anemia    iron deficinecy  . Anxiety    with social phobia  . Asthma   . Balance problem   . Chest pain    that occurs with anxiety  . COPD (chronic obstructive pulmonary disease) ([place])   . Depression   . Diabetes mellitus   . Glaucoma   . Hyperlipidemia   . Hypertension   . Normal cardiac stress test 12/2015   low risk study  . Panic attack   . Prediabetes   . Recurrent falls   . Social phobia     Past Surgical History:  Procedure Laterality Date  . ABDOMINAL HYSTERECTOMY     fibroids, both ovaries left intact  . CHOLECYSTECTOMY    . COSMETIC SURGERY     elbow  . FRACTURE SURGERY     Recurrent elbow surgery  . LIPOMA EXCISION  04/30/2012   Procedure: EXCISION LIPOMA;  Surgeon: Donato Heinz, MD;  Location: [facility];  Service: General;  Laterality: Right;  Excision soft tissue mass right thigh  . ORIF ANKLE FRACTURE Right 12/15/2017   Procedure: OPEN REDUCTION INTERNAL FIXATION (ORIF) RIGHT ANKLE FRACTURE;  Surgeon: Renette Butters, MD;  Location: [facility];  Service: Orthopedics;  Laterality: Right;  . right elbow     reconstruction    There were no vitals filed for this visit.   Subjective Assessment - 02/22/18 0938    Subjective  [name] states that she slipped on a wet floor on 12/05/2017.  She went to the ER and found out that she had fx her ankle.  She had to wait for the swelling to go down therefore she did not have her surgery until 12/15/2017.  She was placed in a cast, then a boot and has now been given an ankle brace.  She has no weight restrictions at this time.     How long can you sit comfortably?  no problem    How long can you stand comfortably?  10 mintues     How long can you walk comfortably?  5-10 mintues     Patient Stated Goals  to be able to walk better, go up and down steps and squat.     Currently in Pain?  Yes    Pain Score  7  worst 10/ best 2     Pain Location  Ankle    Pain Orientation  Right    Pain Type  Acute pain  Pain Onset  More than a month ago    Pain Frequency  Intermittent    Aggravating Factors   moving around     Pain Relieving Factors  elevation          [facility] PT Assessment - 02/22/18 0001      Assessment   Medical Diagnosis  Rt ORIF    Referring Provider  Edmonia Lynch    Onset Date/Surgical Date  12/15/17    Next MD Visit  unknown    Prior Therapy  none      Precautions   Precautions  None      Restrictions   Weight Bearing Restrictions  No      Balance Screen   Has the patient fallen in the past 6 months  Yes    How many times?  1    Has the patient had a decrease in activity level because of a fear of falling?   Yes    Is the patient reluctant to leave their home because of a fear of falling?   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    Leisure  walking,       Cognition   Overall Cognitive Status  Within Functional Limits for tasks assessed      Observation/Other Assessments   Focus on Therapeutic  Outcomes (FOTO)   41      Observation/Other Assessments-Edema    Edema  Figure 8      Figure 8 Edema   Figure 8 - Right   53.3    Figure 8 - Left   50.6      Functional Tests   Functional tests  Single leg stance;Sit to Stand      Single Leg Stance   Comments  Rt 4 seconds; LT 60      Sit to Stand   Comments  22.8      ROM / Strength   AROM / PROM / Strength  AROM;Strength      AROM   AROM Assessment Site  Ankle    Right/Left Ankle  Right    Right Ankle Dorsiflexion  5    Right Ankle Plantar Flexion  35    Right Ankle Inversion  12    Right Ankle Eversion  5      Strength   Strength Assessment Site  Ankle    Right/Left Ankle  Right    Right Ankle Dorsiflexion  3-/5    Right Ankle Plantar Flexion  2+/5    Right Ankle Inversion  3-/5    Right Ankle Eversion  3-/5      Ambulation/Gait   Gait Comments  antalgic gait with ankle sleeve.              Objective measurements completed on examination: See above findings.      [facility] Adult PT Treatment/Exercise - 02/22/18 0001      Exercises   Exercises  Ankle      Ankle Exercises: Seated   ABC's  1 rep      Ankle Exercises: Supine   Other Supine Ankle Exercises  ROM x 10 each              PT Education - 02/22/18 1301    Education provided  Yes    Education Details  HEP  to encourage full ROM    Person(s) Educated  Patient    Methods  Explanation;Handout    Comprehension  Verbalized understanding;Returned demonstration       PT Short Term Goals - 02/22/18 1311      PT SHORT TERM GOAL #1   Title  PT Rt ankle  AROM to be dorsiflexion 10 and plantarflexion 45 to allow pt to have a more normalized gait pattern.     Time  2    Period  Weeks    Status  New    Target Date  03/15/18 PT will not start for a week as we are waiting on authoriztion.      PT SHORT TERM GOAL #2   Title  PT RT ankle pain to be no greater than a 6/10 to allow pt to tolerate standing for 20 mintues to prep for a small meal      Time  3    Period  Weeks    Status  New    Target Date  03/22/18      PT SHORT TERM GOAL #3   Title  PT to be able to walk for 15 mintues to be able to complete short shopping trips     Time  3    Period  Weeks    Status  New      PT SHORT TERM GOAL #4   Title  PT strength of her Rt ankle mm to be increased one grade to allow pt to go up and down 4 steps with hand held assist     Time  3    Period  Weeks    Status  New        PT Long Term Goals - 02/22/18 1316      PT LONG TERM GOAL #1   Title  PT Rt ankle pain to be no greater than 3/10 to allow pt to start weaning from her ankle sleeve.     Time  6    Period  Weeks    Status  New    Target Date  04/05/18      PT LONG TERM GOAL #2   Title  Pt to be able to stand for 40 mintues for socialization/ going to church     Time  6    Period  Weeks    Status  New      PT LONG TERM GOAL #3   Title  PT to be able to walk for an hour to be able to complete all shopping tasks     Time  6    Period  Weeks    Status  New      PT LONG TERM GOAL #4   Title  PT strength of her right ankle mm to be at least 4+/5 to allow pt to arise from a squatted position to pick items off of the floor and care for grandchildren.      Time  6    Period  Weeks    Status  New             Plan - 02/22/18 1303    Clinical Impression Statement  [name] is a 52 yo female who fell last December fx her ankle.  She had an ORIF on 12/15/2017 and is now being referred to skilled physical therapy to return her to her maximal functional potential.  Evauation demonstrates an abnormal gait,  increased edema, increased pain, decreased ROM,  decreased strength , decreased balance, decreased activity tolerance.   [name] will benefit from skilled physical therapy to address these issues and maximize her functional potential.     Clinical Presentation  Stable    Clinical Decision Making  Moderate    Rehab Potential  Good    PT Frequency  3x / week     PT Duration  6 weeks    PT Treatment/Interventions  ADLs/Self Care Home Management;Patient/family education;Therapeutic exercise;Balance training;Neuromuscular re-education;Therapeutic activities;Stair training;Gait training;Functional mobility training;Manual techniques;Passive range of motion    PT Next Visit Plan  Begin and give as a HEP: isometrics, heelraises, toe raises, Single leg stance begin heel toe gait training; Take measurement on visit 2 (next visit will be visit 1 for medicaid reauthorization), progress balance and strengthening as tolerated.        Patient will benefit from skilled therapeutic intervention in order to improve the following deficits and impairments:  Abnormal gait, Decreased activity tolerance, Decreased balance, Decreased endurance, Decreased range of motion, Decreased strength, Difficulty walking, Pain, Increased edema  Visit Diagnosis: Difficulty in walking, not elsewhere classified - Plan: PT plan of care cert/re-cert  Pain in right ankle and joints of right foot - Plan: PT plan of care cert/re-cert  Stiffness of right ankle, not elsewhere classified - Plan: PT plan of care cert/re-cert     Problem List Patient Active Problem List   Diagnosis Date Noted  . Obesity (BMI 30-39.9) 09/02/2017  . Recurrent falls 05/13/2016  . Epidermal cyst 03/10/2016  . Onychomycosis of toenail 02/28/2015  . Knee pain 10/06/2014  . Neck muscle spasm 07/02/2014  . Back pain 10/23/2013  . Lesion of mouth 01/09/2013  . Headache(784.0) 10/05/2012  . Inadequate social support 06/25/2012  . Microalbuminuria 06/25/2012  . Diabetes mellitus, type II ([place]) 04/02/2012  . Keloid scar of skin 09/03/2011  . COPD (chronic obstructive pulmonary disease) ([place]) 05/21/2007  . Hyperlipidemia 01/29/2007  . Anxiety state 11/02/2006  . MDD (major depressive disorder) 11/02/2006  . Essential hypertension 11/02/2006    Rayetta Humphrey, PT CLT [phone] 02/22/2018, 1:24 PM  [facility] [address] [place], [place], [address] Phone: [phone]   Fax:  [phone]  Name: Kayla Choi MRN: [id number] Date of Birth: 08/17/1966

## 2018-02-22 NOTE — Patient Instructions (Addendum)
Ankle Alphabet    Using left ankle and foot only, trace the letters of the alphabet. Perform A to Z. Repeat __3__ times per set. Do ____1 sets per session. Do __1__ sessions per day.  http://orth.exer.us/16   Copyright  VHI. All rights reserved.  ROM: Plantar / Dorsiflexion    With left leg relaxed, gently flex and extend ankle. Move through full range of motion. Avoid pain. Repeat 10____ times per set. Do _1___ sets per session. Do __3__ sessions per day.  http://orth.exer.us/34   Copyright  VHI. All rights reserved.  ROM: Inversion / Eversion    With left leg relaxed, gently turn ankle and foot in and out. Move through full range of motion. Avoid pain. Repeat __10__ times per set. Do __1__ sets per session. Do _3___ sessions per day.  http://orth.exer.us/36   Copyright  VHI. All rights reserved.

## 2018-02-23 ENCOUNTER — Other Ambulatory Visit: Payer: Self-pay

## 2018-02-23 DIAGNOSIS — R29818 Other symptoms and signs involving the nervous system: Secondary | ICD-10-CM

## 2018-02-23 DIAGNOSIS — R42 Dizziness and giddiness: Secondary | ICD-10-CM

## 2018-02-23 DIAGNOSIS — R296 Repeated falls: Secondary | ICD-10-CM

## 2018-03-01 ENCOUNTER — Telehealth (HOSPITAL_COMMUNITY): Payer: Self-pay | Admitting: Physical Therapy

## 2018-03-01 ENCOUNTER — Ambulatory Visit (HOSPITAL_COMMUNITY): Payer: Medicaid Other | Admitting: Physical Therapy

## 2018-03-01 NOTE — Telephone Encounter (Signed)
Called pt with no answer.  Left a message that her next appointment would be on 03/03/2018 and to please call us if she will not be able to make it as we have individuals on our wait list who want to be seen.  Rayetta Humphrey, [place] CLT [phone]

## 2018-03-03 ENCOUNTER — Ambulatory Visit (HOSPITAL_COMMUNITY): Payer: Medicaid Other

## 2018-03-03 ENCOUNTER — Encounter (HOSPITAL_COMMUNITY): Payer: Self-pay

## 2018-03-03 DIAGNOSIS — M25671 Stiffness of right ankle, not elsewhere classified: Secondary | ICD-10-CM

## 2018-03-03 DIAGNOSIS — R262 Difficulty in walking, not elsewhere classified: Secondary | ICD-10-CM | POA: Diagnosis not present

## 2018-03-03 DIAGNOSIS — M25571 Pain in right ankle and joints of right foot: Secondary | ICD-10-CM

## 2018-03-03 NOTE — Therapy (Signed)
[place], [place], [address] Phone: [phone]   Fax:  [phone]  Physical Therapy Treatment  Patient Details  Name: Kayla Choi MRN: [id number] Date of Birth: Jul 08, 1966 Referring Provider: Edmonia Lynch   Encounter Date: 03/03/2018  PT End of Session - 03/03/18 1042    Visit Number  1    Number of Visits  18    Date for PT Re-Evaluation  04/12/18 minireassess 03/19/2018    Authorization Type  Medicaid    Authorization Time Period  3 visits appropved 3/25-->03/07/18    Authorization - Visit Number  1    Authorization - Number of Visits  18    PT Start Time  1034    PT Stop Time  1112    PT Time Calculation (min)  38 min    Activity Tolerance  Patient tolerated treatment well    Behavior During Therapy  [facility] for tasks assessed/performed       Past Medical History:  Diagnosis Date  . Acid reflux   . ADD (attention deficit disorder)   . Anemia    iron deficinecy  . Anxiety    with social phobia  . Asthma   . Balance problem   . Chest pain    that occurs with anxiety  . COPD (chronic obstructive pulmonary disease) ([place])   . Depression   . Diabetes mellitus   . Glaucoma   . Hyperlipidemia   . Hypertension   . Normal cardiac stress test 12/2015   low risk study  . Panic attack   . Prediabetes   . Recurrent falls   . Social phobia     Past Surgical History:  Procedure Laterality Date  . ABDOMINAL HYSTERECTOMY     fibroids, both ovaries left intact  . CHOLECYSTECTOMY    . COSMETIC SURGERY     elbow  . FRACTURE SURGERY     Recurrent elbow surgery  . LIPOMA EXCISION  04/30/2012   Procedure: EXCISION LIPOMA;  Surgeon: Donato Heinz, MD;  Location: [facility];  Service: General;  Laterality: Right;  Excision soft tissue mass right thigh  . ORIF ANKLE FRACTURE Right 12/15/2017   Procedure: OPEN REDUCTION INTERNAL FIXATION (ORIF) RIGHT ANKLE FRACTURE;  Surgeon: Renette Butters, MD;  Location:  [facility];  Service: Orthopedics;  Laterality: Right;  . right elbow     reconstruction    There were no vitals filed for this visit.  Subjective Assessment - 03/03/18 1039    Subjective  Pt stated Rt arch is sore and minimal pain 1/10 soreness on incision.  reports compliance with HEP without questions.  Pt wishes to return to abiltiy to wear shoes comfortably by 4/28 for a family birthday party. Stated 4 of her toes feel like a ingrow toe nails and little toe is numb following.    Patient Stated Goals  to be able to walk better, go up and down steps and squat.     Currently in Pain?  Yes    Pain Score  1     Pain Location  Ankle    Pain Orientation  Right    Pain Descriptors / Indicators  Sore    Pain Type  Acute pain    Pain Onset  More than a month ago    Pain Frequency  Intermittent    Aggravating Factors   moving around    Pain Relieving Factors  elevation  No data recorded       [facility] Adult PT Treatment/Exercise - 03/03/18 0001      Ambulation/Gait   Ambulation Distance (Feet)  226 Feet    Assistive device  None    Pre-Gait Activities  Cueing for heel to toe mechanics; equal stance phase and stride length      Ankle Exercises: Standing   SLS  3x 30"    Heel Raises  10 reps    Toe Raise  10 reps      Ankle Exercises: Seated   Other Seated Ankle Exercises  Isometric all directions 5x 5"             PT Education - 03/03/18 1221    Education provided  Yes    Education Details  Reviewed goals, assured complaincei wht HEP and advanced exercises given this session, copy of eval given to pt.  Educated on proper shoe wear for ankle support and during therapy.      Person(s) Educated  Patient    Methods  Explanation;Demonstration;Handout    Comprehension  Verbalized understanding;Returned demonstration;Need further instruction       PT Short Term Goals - 02/22/18 1311      PT SHORT TERM GOAL #1   Title  PT Rt ankle   AROM to be dorsiflexion 10 and plantarflexion 45 to allow pt to have a more normalized gait pattern.     Time  2    Period  Weeks    Status  New    [place] Date  03/15/18 PT will not start for a week as we are waiting on authoriztion.      PT SHORT TERM GOAL #2   Title  PT RT ankle pain to be no greater than a 6/10 to allow pt to tolerate standing for 20 mintues to prep for a small meal     Time  3    Period  Weeks    Status  New    Target Date  03/22/18      PT SHORT TERM GOAL #3   Title  PT to be able to walk for 15 mintues to be able to complete short shopping trips     Time  3    Period  Weeks    Status  New      PT SHORT TERM GOAL #4   Title  PT strength of her Rt ankle mm to be increased one grade to allow pt to go up and down 4 steps with hand held assist     Time  3    Period  Weeks    Status  New        PT Long Term Goals - 02/22/18 1316      PT LONG TERM GOAL #1   Title  PT Rt ankle pain to be no greater than 3/10 to allow pt to start weaning from her ankle sleeve.     Time  6    Period  Weeks    Status  New    Target Date  04/05/18      PT LONG TERM GOAL #2   Title  Pt to be able to stand for 40 mintues for socialization/ going to church     Time  6    Period  Weeks    Status  New      PT LONG TERM GOAL #3   Title  PT to be able to walk for an hour to be  able to complete all shopping tasks     Time  6    Period  Weeks    Status  New      PT LONG TERM GOAL #4   Title  PT strength of her right ankle mm to be at least 4+/5 to allow pt to arise from a squatted position to pick items off of the floor and care for grandchildren.      Time  6    Period  Weeks    Status  New            Plan - 03/03/18 1133    Clinical Impression Statement  Pt arrived wearing wedge flip-flops to session, pt educated on proper shoes to wear to therapy as well as in general for ankle support.  Reviewed goals, assured compliance wiht HEP and copy of eval given to pt.   Session focus on gait training to improve mechanics and ankle strengthening.  Moderate cueing and demonstrate to improve heel strike and toe push off as well as equal weight bearing/stride length with gait mechanics.  Pt able to complete all therex with no reports of pain, min cueing for form and technique with new exercises.  Additional exercises added to HEP for strengthening.      Rehab Potential  Good    PT Frequency  3x / week    PT Duration  6 weeks    PT Treatment/Interventions  ADLs/Self Care Home Management;Patient/family education;Therapeutic exercise;Balance training;Neuromuscular re-education;Therapeutic activities;Stair training;Gait training;Functional mobility training;Manual techniques;Passive range of motion    PT Next Visit Plan  Review compliance iwht HEP.  Next session take measurement on visit 2 (next visit will be visit 1 for medicaid reauthorization), progress balance and strengthening as tolerated.   Begin rocker board next session.      PT Home Exercise Plan  3/27: isometric, heel/toe raises, SLS.       Patient will benefit from skilled therapeutic intervention in order to improve the following deficits and impairments:  Abnormal gait, Decreased activity tolerance, Decreased balance, Decreased endurance, Decreased range of motion, Decreased strength, Difficulty walking, Pain, Increased edema  Visit Diagnosis: Difficulty in walking, not elsewhere classified  Pain in right ankle and joints of right foot  Stiffness of right ankle, not elsewhere classified     Problem List Patient Active Problem List   Diagnosis Date Noted  . Obesity (BMI 30-39.9) 09/02/2017  . Recurrent falls 05/13/2016  . Epidermal cyst 03/10/2016  . Onychomycosis of toenail 02/28/2015  . Knee pain 10/06/2014  . Neck muscle spasm 07/02/2014  . Back pain 10/23/2013  . Lesion of mouth 01/09/2013  . Headache(784.0) 10/05/2012  . Inadequate social support 06/25/2012  . Microalbuminuria 06/25/2012   . Diabetes mellitus, type II ([place]) 04/02/2012  . Keloid scar of skin 09/03/2011  . COPD (chronic obstructive pulmonary disease) ([place]) 05/21/2007  . Hyperlipidemia 01/29/2007  . Anxiety state 11/02/2006  . MDD (major depressive disorder) 11/02/2006  . Essential hypertension 11/02/2006   Ihor Austin, [place]; [place]  Aldona Lento 03/03/2018, 12:23 PM  [facility] [address] [place], [place], [address] Phone: [phone]   Fax:  [phone]  Name: Kayla Choi MRN: [id number] Date of Birth: Jan 27, 1966

## 2018-03-03 NOTE — Patient Instructions (Addendum)
Dorsiflexion: Isometric    With ball or rolled pillow between feet, squeeze feet together. Hold 5 seconds. Relax. Repeat 5 times per set. Do 2 sets per session.  http://orth.exer.us/2   Copyright  VHI. All rights reserved.  Plantar Flexion: Isometric    Press left foot into ball or rolled pillow against wall. Hold 5 seconds. Relax. Repeat 5times per set. Do 2 sets per session.   http://orth.exer.us/0   Copyright  VHI. All rights reserved.    Inversion: Isometric    Press inner borders of feet into ball or rolled pillow between feet. Hold 5 seconds. Relax. Repeat5times per set. Do 2 sets per session.   http://orth.exer.us/6   Copyright  VHI. All rights reserved.   Eversion: Isometric    Press outer border of right foot into ball or rolled pillow against wall. Hold 5 seconds. Relax. Repeat 5 times per set. Do 2 sets per session.  http://orth.exer.us/4   Copyright  VHI. All rights reserved.   Toe / Heel Raise (Standing)    Standing with support, raise heels, then rock back on heels and raise toes. Repeat 10 times.  Copyright  VHI. All rights reserved.    Single Leg Balance: Eyes Open    Stand on right leg with eyes open. Hold 30 seconds. 5 reps 2 times per day.  http://ggbe.exer.us/5   Copyright  VHI. All rights reserved.

## 2018-03-08 ENCOUNTER — Encounter (HOSPITAL_COMMUNITY): Payer: Self-pay | Admitting: Physical Therapy

## 2018-03-08 ENCOUNTER — Ambulatory Visit (HOSPITAL_COMMUNITY): Payer: Medicaid Other | Attending: Orthopedic Surgery | Admitting: Physical Therapy

## 2018-03-08 DIAGNOSIS — M25671 Stiffness of right ankle, not elsewhere classified: Secondary | ICD-10-CM | POA: Insufficient documentation

## 2018-03-08 DIAGNOSIS — R262 Difficulty in walking, not elsewhere classified: Secondary | ICD-10-CM | POA: Insufficient documentation

## 2018-03-08 DIAGNOSIS — M25571 Pain in right ankle and joints of right foot: Secondary | ICD-10-CM | POA: Insufficient documentation

## 2018-03-08 NOTE — Therapy (Signed)
[place], [place], [address] Phone: [phone]   Fax:  [phone]  Physical Therapy Treatment  Patient Details  Name: Kayla Choi MRN: [id number] Date of Birth: Dec 27, 1965 Referring Provider: Edmonia Lynch   Encounter Date: 03/08/2018  PT End of Session - 03/08/18 1007    Visit Number  2    Number of Visits  18    Date for PT Re-Evaluation  04/12/18 minireassess 03/19/2018    Authorization Type  Medicaid    Authorization Time Period  3 visits appropved 3/25-->03/07/18    Authorization - Visit Number  2    Authorization - Number of Visits  18    PT Start Time  0950    PT Stop Time  1030    PT Time Calculation (min)  40 min    Activity Tolerance  Patient tolerated treatment well    Behavior During Therapy  [facility] for tasks assessed/performed       Past Medical History:  Diagnosis Date  . Acid reflux   . ADD (attention deficit disorder)   . Anemia    iron deficinecy  . Anxiety    with social phobia  . Asthma   . Balance problem   . Chest pain    that occurs with anxiety  . COPD (chronic obstructive pulmonary disease) ([place])   . Depression   . Diabetes mellitus   . Glaucoma   . Hyperlipidemia   . Hypertension   . Normal cardiac stress test 12/2015   low risk study  . Panic attack   . Prediabetes   . Recurrent falls   . Social phobia     Past Surgical History:  Procedure Laterality Date  . ABDOMINAL HYSTERECTOMY     fibroids, both ovaries left intact  . CHOLECYSTECTOMY    . COSMETIC SURGERY     elbow  . FRACTURE SURGERY     Recurrent elbow surgery  . LIPOMA EXCISION  04/30/2012   Procedure: EXCISION LIPOMA;  Surgeon: Donato Heinz, MD;  Location: [facility];  Service: General;  Laterality: Right;  Excision soft tissue mass right thigh  . ORIF ANKLE FRACTURE Right 12/15/2017   Procedure: OPEN REDUCTION INTERNAL FIXATION (ORIF) RIGHT ANKLE FRACTURE;  Surgeon: Renette Butters, MD;  Location:  [facility];  Service: Orthopedics;  Laterality: Right;  . right elbow     reconstruction    There were no vitals filed for this visit.  Subjective Assessment - 03/08/18 1000    Subjective  PT cooked Sunday dinner she was up on her leg for about 45 minutes she has increased pain and swelling today     Pertinent History  ORIF of Rt ankle    Patient Stated Goals  to be able to walk better, go up and down steps and squat.     Pain Score  5     Pain Location  Ankle    Pain Orientation  Left;Lateral    Pain Descriptors / Indicators  Aching    Pain Onset  More than a month ago    Pain Frequency  Intermittent         [facility] PT Assessment - 03/08/18 0001      Assessment   Medical Diagnosis  Rt ORIF    Referring Provider  Edmonia Lynch    Onset Date/Surgical Date  12/15/17    Next MD Visit  unknown    Prior Therapy  none  Precautions   Precautions  None      Restrictions   Weight Bearing Restrictions  No      Home Environment   Living Environment  Private residence      Prior Function   Level of Independence  Independent    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    Leisure  walking,       Cognition   Overall Cognitive Status  Within Functional Limits for tasks assessed      Observation/Other Assessments   Focus on Therapeutic Outcomes (FOTO)   41      Observation/Other Assessments-Edema    Edema  Figure 8      Figure 8 Edema   Figure 8 - Right   55 was 53.3    Figure 8 - Left   50.6      Functional Tests   Functional tests  Single leg stance;Sit to Stand      Single Leg Stance   Comments  Rt  seconds22 was 4 ; LT 60      Sit to Stand   Comments  22.8      AROM   Right Ankle Dorsiflexion  8 was 5     Right Ankle Plantar Flexion  45 was 35     Right Ankle Inversion  20 was 12    Right Ankle Eversion  10 was 5      Strength   Right Ankle Dorsiflexion  4/5 was3-    Right Ankle Plantar Flexion  3/5 was 2+    Right Ankle Inversion   4-/5 was 3-/5     Right Ankle Eversion  4-/5 was 3-/5       Ambulation/Gait   Gait Comments  antalgic gait with ankle sleeve.             No data recorded       [facility] Adult PT Treatment/Exercise - 03/08/18 0001      Exercises   Exercises  Ankle      Ankle Exercises: Seated   BAPS Ankle alphabet   Sitting;Level 3;15 reps  All directions     Ankle Exercises: Supine   T-Band  All direction green x 10 EACh               PT Short Term Goals - 03/08/18 1025      PT SHORT TERM GOAL #1   Title  PT Rt ankle  AROM to be dorsiflexion 10 and plantarflexion 45 to allow pt to have a more normalized gait pattern.     Time  2    Period  Weeks    Status  On-going      PT SHORT TERM GOAL #2   Title  PT RT ankle pain to be no greater than a 6/10 to allow pt to tolerate standing for 20 mintues to prep for a small meal     Time  3    Period  Weeks    Status  Achieved      PT SHORT TERM GOAL #3   Title  PT to be able to walk for 15 mintues to be able to complete short shopping trips     Baseline  03/08/2018  able to walk for an hour with her brace on     Time  3    Period  Weeks    Status  Achieved      PT SHORT TERM GOAL #4   Title  PT strength of her  Rt ankle mm to be increased one grade to allow pt to go up and down 4 steps with hand held assist     Time  3    Period  Weeks    Status  On-going        PT Long Term Goals - 03/08/18 1027      PT LONG TERM GOAL #1   Title  PT Rt ankle pain to be no greater than 3/10 to allow pt to start weaning from her ankle sleeve.     Time  6    Period  Weeks    Status  On-going      PT LONG TERM GOAL #2   Title  Pt to be able to stand for 40 mintues for socialization/ going to church     Time  6    Period  Weeks    Status  On-going      PT LONG TERM GOAL #3   Title  PT to be able to walk for an hour to be able to complete all shopping tasks     Time  6    Period  Weeks    Status  Achieved      PT LONG TERM GOAL #4    Title  PT strength of her right ankle mm to be at least 4+/5 to allow pt to arise from a squatted position to pick items off of the floor and care for grandchildren.      Time  6    Period  Weeks    Status  On-going            Plan - 03/08/18 1032    Clinical Impression Statement  PT did not come to her scheduled appointment on Friday.  Her medicaid autorization ran out on 03/07/2017.  Currently seeking reauthorization .  Pt has improved but is not normal for  balance, ROM and strength.  Pain and edema remain about the same as at evaluation.  PT given tband exercises to improve strength.     Rehab Potential  Good    PT Frequency  3x / week    PT Duration  6 weeks    PT Treatment/Interventions  ADLs/Self Care Home Management;Patient/family education;Therapeutic exercise;Balance training;Neuromuscular re-education;Therapeutic activities;Stair training;Gait training;Functional mobility training;Manual techniques;Passive range of motion    PT Next Visit Plan  Manual for edema control begin weight bearing BAPs and proprioception exercises.      PT Home Exercise Plan  3/27: isometric, heel/toe raises, SLS.       Patient will benefit from skilled therapeutic intervention in order to improve the following deficits and impairments:  Abnormal gait, Decreased activity tolerance, Decreased balance, Decreased endurance, Decreased range of motion, Decreased strength, Difficulty walking, Pain, Increased edema  Visit Diagnosis: Difficulty in walking, not elsewhere classified  Pain in right ankle and joints of right foot  Stiffness of right ankle, not elsewhere classified     Problem List Patient Active Problem List   Diagnosis Date Noted  . Obesity (BMI 30-39.9) 09/02/2017  . Recurrent falls 05/13/2016  . Epidermal cyst 03/10/2016  . Onychomycosis of toenail 02/28/2015  . Knee pain 10/06/2014  . Neck muscle spasm 07/02/2014  . Back pain 10/23/2013  . Lesion of mouth 01/09/2013  .  Headache(784.0) 10/05/2012  . Inadequate social support 06/25/2012  . Microalbuminuria 06/25/2012  . Diabetes mellitus, type II ([place]) 04/02/2012  . Keloid scar of skin 09/03/2011  . COPD (chronic obstructive pulmonary disease) ([place]) 05/21/2007  .  Hyperlipidemia 01/29/2007  . Anxiety state 11/02/2006  . MDD (major depressive disorder) 11/02/2006  . Essential hypertension 11/02/2006    Kayla Choi, PT CLT [phone] 03/08/2018, 10:37 AM  [facility] [address] [place], [place], [address] Phone: [phone]   Fax:  [phone]  Name: Kayla Choi MRN: [id number] Date of Birth: Jan 15, 1966

## 2018-03-08 NOTE — Patient Instructions (Addendum)
Dorsiflexion: Resisted    Facing anchor, tubing around left foot, pull toward face.  Repeat __10__ times per set. Do __1__ sets per session. Do _3___ sessions per day.  http://orth.exer.us/8   Copyright  VHI. All rights reserved.  Eversion: Resisted    With right foot in tubing loop, hold tubing around other foot to resist and turn foot out. Repeat __10__ times per set. Do _1___ sets per session. Do 3____ sessions per day.  http://orth.exer.us/14   Copyright  VHI. All rights reserved.  Plantar Flexion: Resisted    Anchor behind, tubing around left foot, press down. Repeat _10___ times per set. Do __1__ sets per session. Do _3___ sessions per day.  http://orth.exer.us/10   Copyright  VHI. All rights reserved.

## 2018-03-09 ENCOUNTER — Telehealth (HOSPITAL_COMMUNITY): Payer: Self-pay | Admitting: Family Medicine

## 2018-03-09 NOTE — Telephone Encounter (Signed)
03/09/18  Pt called and wanted to know if she had been approved for more visits.  Otho Ket looked at [employer] and it has been submitted but nothing yet approved.  I left patient a message and told her that I would check about the Friday appt and call her

## 2018-03-10 ENCOUNTER — Encounter (HOSPITAL_COMMUNITY): Payer: Self-pay

## 2018-03-11 ENCOUNTER — Telehealth (HOSPITAL_COMMUNITY): Payer: Self-pay | Admitting: Physical Therapy

## 2018-03-11 NOTE — Telephone Encounter (Signed)
Waiting on Medicaid approval and cx Friday's appointment b/c we don't have it yet. NF 03/11/18

## 2018-03-12 ENCOUNTER — Ambulatory Visit (HOSPITAL_COMMUNITY): Payer: Medicaid Other | Admitting: Physical Therapy

## 2018-03-12 ENCOUNTER — Telehealth (HOSPITAL_COMMUNITY): Payer: Self-pay | Admitting: Family Medicine

## 2018-03-12 NOTE — Telephone Encounter (Signed)
03/12/18  I called patient to let her know that we now had approval for her visits and we would see her 4/8

## 2018-03-15 ENCOUNTER — Telehealth (HOSPITAL_COMMUNITY): Payer: Self-pay | Admitting: Family Medicine

## 2018-03-15 ENCOUNTER — Ambulatory Visit (HOSPITAL_COMMUNITY): Payer: Medicaid Other | Admitting: Physical Therapy

## 2018-03-15 ENCOUNTER — Encounter (HOSPITAL_COMMUNITY): Payer: Self-pay | Admitting: Physical Therapy

## 2018-03-15 DIAGNOSIS — M25571 Pain in right ankle and joints of right foot: Secondary | ICD-10-CM | POA: Diagnosis present

## 2018-03-15 DIAGNOSIS — R262 Difficulty in walking, not elsewhere classified: Secondary | ICD-10-CM | POA: Diagnosis not present

## 2018-03-15 DIAGNOSIS — M25671 Stiffness of right ankle, not elsewhere classified: Secondary | ICD-10-CM

## 2018-03-15 NOTE — Therapy (Signed)
[place], [place], [address] Phone: [phone]   Fax:  [phone]  Physical Therapy Treatment  Patient Details  Name: Kayla Choi MRN: [id number] Date of Birth: 1966/04/03 Referring Provider: Edmonia Lynch   Encounter Date: 03/15/2018  PT End of Session - 03/15/18 0900    Visit Number  3    Number of Visits  14    Date for PT Re-Evaluation  04/12/18 minireassess 03/19/2018    Authorization Type  Medicaid    Authorization Time Period  3 visits appropved 3/25-->03/07/18 2 used then 12 visits approved 4/3-4/30     Authorization - Visit Number  2    Authorization - Number of Visits  14    PT Start Time  0900    PT Stop Time  0945    PT Time Calculation (min)  45 min    Activity Tolerance  Patient tolerated treatment well    Behavior During Therapy  [facility] for tasks assessed/performed       Past Medical History:  Diagnosis Date  . Acid reflux   . ADD (attention deficit disorder)   . Anemia    iron deficinecy  . Anxiety    with social phobia  . Asthma   . Balance problem   . Chest pain    that occurs with anxiety  . COPD (chronic obstructive pulmonary disease) ([place])   . Depression   . Diabetes mellitus   . Glaucoma   . Hyperlipidemia   . Hypertension   . Normal cardiac stress test 12/2015   low risk study  . Panic attack   . Prediabetes   . Recurrent falls   . Social phobia     Past Surgical History:  Procedure Laterality Date  . ABDOMINAL HYSTERECTOMY     fibroids, both ovaries left intact  . CHOLECYSTECTOMY    . COSMETIC SURGERY     elbow  . FRACTURE SURGERY     Recurrent elbow surgery  . LIPOMA EXCISION  04/30/2012   Procedure: EXCISION LIPOMA;  Surgeon: Donato Heinz, MD;  [facility];  Service: General;  Laterality: Right;  Excision soft tissue mass right thigh  . ORIF ANKLE FRACTURE Right 12/15/2017   Procedure: OPEN REDUCTION INTERNAL FIXATION (ORIF) RIGHT ANKLE FRACTURE;   Surgeon: Renette Butters, MD;  Location: [facility];  Service: Orthopedics;  Laterality: Right;  . right elbow     reconstruction    There were no vitals filed for this visit.  Subjective Assessment - 03/15/18 0902    Subjective  PT states that the incisin and the arch of her foot are the only things that bother her now.  She goes to the MD on Wed.     Pertinent History  ORIF of Rt ankle    Patient Stated Goals  to be able to walk better, go up and down steps and squat.     Currently in Pain?  No/denies greatest when she is hurting is a 10/10 after walking for over an hour.     Pain Onset  More than a month ago                       [facility] Adult PT Treatment/Exercise - 03/15/18 0001      Manual Therapy   Manual Therapy  Edema management    Manual therapy comments  done seperate from all other aspects of treatment  Edema Management  retro massage to decrease swelling       Ankle Exercises: Standing   SLS  5x    Heel Raises  15 reps    Toe Raise  15 reps      Ankle Exercises: Seated   Towel Crunch  3 reps    BAPS  Sitting;Level 3;15 reps      Ankle Exercises: Supine   T-Band  All direction green x [id number]               PT Short Term Goals - 03/08/18 1025      PT SHORT TERM GOAL #1   Title  PT Rt ankle  AROM to be dorsiflexion 10 and plantarflexion 45 to allow pt to have a more normalized gait pattern.     Time  2    Period  Weeks    Status  On-going      PT SHORT TERM GOAL #2   Title  PT RT ankle pain to be no greater than a 6/10 to allow pt to tolerate standing for 20 mintues to prep for a small meal     Time  3    Period  Weeks    Status  Achieved      PT SHORT TERM GOAL #3   Title  PT to be able to walk for 15 mintues to be able to complete short shopping trips     Baseline  03/08/2018  able to walk for an hour with her brace on     Time  3    Period  Weeks    Status  Achieved      PT SHORT TERM GOAL #4   Title  PT  strength of her Rt ankle mm to be increased one grade to allow pt to go up and down 4 steps with hand held assist     Time  3    Period  Weeks    Status  On-going        PT Long Term Goals - 03/08/18 1027      PT LONG TERM GOAL #1   Title  PT Rt ankle pain to be no greater than 3/10 to allow pt to start weaning from her ankle sleeve.     Time  6    Period  Weeks    Status  On-going      PT LONG TERM GOAL #2   Title  Pt to be able to stand for 40 mintues for socialization/ going to church     Time  6    Period  Weeks    Status  On-going      PT LONG TERM GOAL #3   Title  PT to be able to walk for an hour to be able to complete all shopping tasks     Time  6    Period  Weeks    Status  Achieved      PT LONG TERM GOAL #4   Title  PT strength of her right ankle mm to be at least 4+/5 to allow pt to arise from a squatted position to pick items off of the floor and care for grandchildren.      Time  6    Period  Weeks    Status  On-going            Plan - 03/15/18 0943    Clinical Impression Statement  Added towel crunch to decrease arch pain  and manual to decrease edema to treatment.  Increased reps to 15 for all to improve ankle activity tolerance.     Rehab Potential  Good    PT Frequency  3x / week    PT Duration  6 weeks    PT Treatment/Interventions  ADLs/Self Care Home Management;Patient/family education;Therapeutic exercise;Balance training;Neuromuscular re-education;Therapeutic activities;Stair training;Gait training;Functional mobility training;Manual techniques;Passive range of motion    PT Next Visit Plan   begin weight bearing BAPs and proprioception exercises.      PT Home Exercise Plan  3/27: isometric, heel/toe raises, SLS.       Patient will benefit from skilled therapeutic intervention in order to improve the following deficits and impairments:  Abnormal gait, Decreased activity tolerance, Decreased balance, Decreased endurance, Decreased range of  motion, Decreased strength, Difficulty walking, Pain, Increased edema  Visit Diagnosis: Difficulty in walking, not elsewhere classified  Pain in right ankle and joints of right foot  Stiffness of right ankle, not elsewhere classified     Problem List Patient Active Problem List   Diagnosis Date Noted  . Obesity (BMI 30-39.9) 09/02/2017  . Recurrent falls 05/13/2016  . Epidermal cyst 03/10/2016  . Onychomycosis of toenail 02/28/2015  . Knee pain 10/06/2014  . Neck muscle spasm 07/02/2014  . Back pain 10/23/2013  . Lesion of mouth 01/09/2013  . Headache(784.0) 10/05/2012  . Inadequate social support 06/25/2012  . Microalbuminuria 06/25/2012  . Diabetes mellitus, type II ([place]) 04/02/2012  . Keloid scar of skin 09/03/2011  . COPD (chronic obstructive pulmonary disease) ([place]) 05/21/2007  . Hyperlipidemia 01/29/2007  . Anxiety state 11/02/2006  . MDD (major depressive disorder) 11/02/2006  . Essential hypertension 11/02/2006   Rayetta Humphrey, PT CLT [phone] 03/15/2018, 9:46 AM  [facility] [address] [place], [place], [address] Phone: [phone]   Fax:  [phone]  Name: Kayla Choi MRN: [id number] Date of Birth: [birth year]

## 2018-03-15 NOTE — Telephone Encounter (Signed)
03/15/18  Pt said she had to cx 4/10 appt she has a dr appt.

## 2018-03-15 NOTE — Patient Instructions (Addendum)
Toe Curl: Unilateral    With right foot resting on towel, slowly bunch up towel by curling toes. Repeat 3___ times per set. Do ___1_ sets per session. Do ___2_ sessions per day. http://orth.exer.us/18   Copyright  VHI. All rights reserved.

## 2018-03-17 ENCOUNTER — Encounter (HOSPITAL_COMMUNITY): Payer: Self-pay

## 2018-03-19 ENCOUNTER — Telehealth (HOSPITAL_COMMUNITY): Payer: Self-pay | Admitting: Family Medicine

## 2018-03-19 ENCOUNTER — Ambulatory Visit (HOSPITAL_COMMUNITY): Payer: Medicaid Other | Admitting: Physical Therapy

## 2018-03-19 NOTE — Telephone Encounter (Signed)
03/19/18  pt came in but said she had been throwing up this morning and I suggested that she not stay... she will be at her next appt. Monday

## 2018-03-22 ENCOUNTER — Telehealth (HOSPITAL_COMMUNITY): Payer: Self-pay | Admitting: Family Medicine

## 2018-03-22 ENCOUNTER — Ambulatory Visit (HOSPITAL_COMMUNITY): Payer: Medicaid Other

## 2018-03-22 NOTE — Telephone Encounter (Signed)
03/22/18  pt left a message to cx today but no reason was given

## 2018-03-24 ENCOUNTER — Telehealth (HOSPITAL_COMMUNITY): Payer: Self-pay | Admitting: Family Medicine

## 2018-03-24 ENCOUNTER — Ambulatory Visit (HOSPITAL_COMMUNITY): Payer: Medicaid Other

## 2018-03-24 NOTE — Telephone Encounter (Signed)
03/24/18  patient left a message that she must have some kind of a virus, won't be here again today

## 2018-03-26 ENCOUNTER — Ambulatory Visit (HOSPITAL_COMMUNITY): Payer: Medicaid Other | Admitting: Physical Therapy

## 2018-03-26 ENCOUNTER — Encounter (HOSPITAL_COMMUNITY): Payer: Self-pay | Admitting: Physical Therapy

## 2018-03-26 DIAGNOSIS — M25571 Pain in right ankle and joints of right foot: Secondary | ICD-10-CM

## 2018-03-26 DIAGNOSIS — M25671 Stiffness of right ankle, not elsewhere classified: Secondary | ICD-10-CM

## 2018-03-26 DIAGNOSIS — R262 Difficulty in walking, not elsewhere classified: Secondary | ICD-10-CM

## 2018-03-26 NOTE — Patient Instructions (Addendum)
PRE: Dorsiflexion    Sitting with right  leg over edge of table or bed and _3___ pound weight around foot, flex ankle, moving toes toward knee. Repeat with 5 # Repeat 10____ times per set. Do 1____ sets per session. Do 2____ sessions per day.  http://orth.exer.us/52   Copyright  VHI. All rights reserved.

## 2018-03-26 NOTE — Therapy (Signed)
[place], [place], [address] Phone: [phone]   Fax:  [phone]  Physical Therapy Treatment  Patient Details  Name: Kayla Choi MRN: [id number] Date of Birth: 1966-07-17 Referring Provider: Edmonia Lynch    Encounter Date: 03/26/2018  PT End of Session - 03/26/18 0858    Visit Number  4    Number of Visits  8    Date for PT Re-Evaluation  04/12/18 minireassess 03/19/2018    Authorization Type  Medicaid    Authorization Time Period  3 visits appropved 3/25-->03/07/18 2 used then 12 visits approved 4/3-4/30     Authorization - Visit Number  4    Authorization - Number of Visits  8    PT Start Time  0815    PT Stop Time  0858    PT Time Calculation (min)  43 min    Activity Tolerance  Patient tolerated treatment well    Behavior During Therapy  [facility] for tasks assessed/performed       Past Medical History:  Diagnosis Date  . Acid reflux   . ADD (attention deficit disorder)   . Anemia    iron deficinecy  . Anxiety    with social phobia  . Asthma   . Balance problem   . Chest pain    that occurs with anxiety  . COPD (chronic obstructive pulmonary disease) ([place])   . Depression   . Diabetes mellitus   . Glaucoma   . Hyperlipidemia   . Hypertension   . Normal cardiac stress test 12/2015   low risk study  . Panic attack   . Prediabetes   . Recurrent falls   . Social phobia     Past Surgical History:  Procedure Laterality Date  . ABDOMINAL HYSTERECTOMY     fibroids, both ovaries left intact  . CHOLECYSTECTOMY    . COSMETIC SURGERY     elbow  . FRACTURE SURGERY     Recurrent elbow surgery  . LIPOMA EXCISION  04/30/2012   Procedure: EXCISION LIPOMA;  Surgeon: Donato Heinz, MD;  Location[facility];  Service: General;  Laterality: Right;  Excision soft tissue mass right thigh  . ORIF ANKLE FRACTURE Right 12/15/2017   Procedure: OPEN REDUCTION INTERNAL FIXATION (ORIF) RIGHT ANKLE FRACTURE;   Surgeon: Renette Butters, MD;  Location: [facility];  Service: Orthopedics;  Laterality: Right;  . right elbow     reconstruction    There were no vitals filed for this visit.  Subjective Assessment - 03/26/18 0813    Subjective  Kayla Choi states that she has been ill therefore she has not been to treatment.      How long can you sit comfortably?  no problem    How long can you stand comfortably?  Pt states that she is about back to normal 10 mintues     How long can you walk comfortably?  Able to walk 20-30 minutes now was 5-10 mintues     Patient Stated Goals  to be able to walk better, go up and down steps and squat.     Pain Score  3     Pain Location  Ankle    Pain Orientation  Right    Pain Descriptors / Indicators  Discomfort    Pain Type  Acute pain    Pain Onset  More than a month ago    Aggravating Factors   activity  Pain Relieving Factors  rest, ice          [facility] PT Assessment - 03/26/18 0001      Assessment   Medical Diagnosis  Rt ORIF    Referring Provider  Edmonia Lynch     Onset Date/Surgical Date  12/15/17    Next MD Visit  unknown    Prior Therapy  none      Precautions   Precautions  None      Restrictions   Weight Bearing Restrictions  No      Home Environment   Living Environment  Private residence      Prior Function   Level of Independence  Independent    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    Leisure  walking,       Cognition   Overall Cognitive Status  Within Functional Limits for tasks assessed      Observation/Other Assessments   Focus on Therapeutic Outcomes (FOTO)   41      Observation/Other Assessments-Edema    Edema  Figure 8      Figure 8 Edema   Figure 8 - Right   55.5 on 4/1 was 55 inital eval 53.3    Figure 8 - Left   50.6      Functional Tests   Functional tests  Single leg stance;Sit to Stand      Single Leg Stance   Comments  Rt  seconds 40 seconds was 22;  initial  4 ; LT 60       Sit to Stand   Comments   5 x 11.38" was 22.8      AROM   Right Ankle Dorsiflexion  10 initially was  5 , 03/08/2018: 8    Right Ankle Plantar Flexion  50 initial was  35 ; 45 on 4/1    Right Ankle Inversion  25 initally  12;  03/08/2018 20     Right Ankle Eversion  15 initally  5; 4/1 was 15       Strength   Right Ankle Dorsiflexion  4+/5 inital 3-; 4/1 was 4/5     Right Ankle Plantar Flexion  4+/5 initally  2+; 4/1 was 3/5     Right Ankle Inversion  5/5 initally 3-/5 '; 03/08/18 = 4-/5     Right Ankle Eversion  5/5 inital  3-/5 ; on 4/1 4-/5       Ambulation/Gait   Gait Comments  antalgic gait with ankle sleeve.                    [facility] Adult PT Treatment/Exercise - 03/26/18 0001      Exercises   Exercises  Ankle      Manual Therapy   Manual Therapy  Edema management    Manual therapy comments  done seperate from all other aspects of treatment     Edema Management  retro massage to decrease swelling       Ankle Exercises: Stretches   Slant Board Stretch  1 rep;60 seconds      Ankle Exercises: Standing   SLS  5x    Tai Chi  single heel raise on Rt x 10       Ankle Exercises: Seated   Other Seated Ankle Exercises  Dorsiflexion 3# x 15              PT Education - 03/26/18 7829    Education provided  Yes    Education Details  Resisted DF    Person(s) Educated  Patient    Methods  Explanation;Handout    Comprehension  Verbalized understanding;Returned demonstration       PT Short Term Goals - 03/26/18 0901      PT SHORT TERM GOAL #1   Title  PT Rt ankle  AROM to be dorsiflexion 10 and plantarflexion 45 to allow pt to have a more normalized gait pattern.     Time  2    Period  Weeks    Status  Achieved      PT SHORT TERM GOAL #2   Title  PT RT ankle pain to be no greater than a 6/10 to allow pt to tolerate standing for 20 mintues to prep for a small meal     Time  3    Period  Weeks    Status  Achieved      PT SHORT TERM GOAL #3   Title  PT to  be able to walk for 15 mintues to be able to complete short shopping trips     Baseline  03/08/2018  able to walk for an hour with her brace on     Time  3    Period  Weeks    Status  Achieved      PT SHORT TERM GOAL #4   Title  PT strength of her Rt ankle mm to be increased one grade to allow pt to go up and down 4 steps with hand held assist     Time  3    Period  Weeks    Status  Achieved        PT Long Term Goals - 03/26/18 0901      PT LONG TERM GOAL #1   Title  PT Rt ankle pain to be no greater than 3/10 to allow pt to start weaning from her ankle sleeve.     Time  6    Period  Weeks    Status  On-going      PT LONG TERM GOAL #2   Title  Pt to be able to stand for 40 mintues for socialization/ going to church     Time  6    Period  Weeks    Status  On-going      PT LONG TERM GOAL #3   Title  PT to be able to walk for an hour to be able to complete all shopping tasks     Time  6    Period  Weeks    Status  Achieved      PT LONG TERM GOAL #4   Title  PT strength of her right ankle mm to be at least 4+/5 to allow pt to arise from a squatted position to pick items off of the floor and care for grandchildren.      Time  6    Period  Weeks    Status  Achieved            Plan - 03/26/18 3235    Clinical Impression Statement  Pt with mini reassess.  All aspects except for edema improving.  Instructed pt to increase icing and begin self manual techniques.  Pt authorization ends on 4/30 will not need further skilled treatment after this but will need to continue  exercises and manual on her own .     Rehab Potential  Good    PT Frequency  3x / week    PT  Duration  6 weeks    PT Treatment/Interventions  ADLs/Self Care Home Management;Patient/family education;Therapeutic exercise;Balance training;Neuromuscular re-education;Therapeutic activities;Stair training;Gait training;Functional mobility training;Manual techniques;Passive range of motion    PT Next Visit Plan   Balance master; steps getting up off of floor    PT Home Exercise Plan  3/27: isometric, heel/toe raises, SLS.       Patient will benefit from skilled therapeutic intervention in order to improve the following deficits and impairments:  Abnormal gait, Decreased activity tolerance, Decreased balance, Decreased endurance, Decreased range of motion, Decreased strength, Difficulty walking, Pain, Increased edema  Visit Diagnosis: Difficulty in walking, not elsewhere classified  Pain in right ankle and joints of right foot  Stiffness of right ankle, not elsewhere classified     Problem List Patient Active Problem List   Diagnosis Date Noted  . Obesity (BMI 30-39.9) 09/02/2017  . Recurrent falls 05/13/2016  . Epidermal cyst 03/10/2016  . Onychomycosis of toenail 02/28/2015  . Knee pain 10/06/2014  . Neck muscle spasm 07/02/2014  . Back pain 10/23/2013  . Lesion of mouth 01/09/2013  . Headache(784.0) 10/05/2012  . Inadequate social support 06/25/2012  . Microalbuminuria 06/25/2012  . Diabetes mellitus, type II ([place]) 04/02/2012  . Keloid scar of skin 09/03/2011  . COPD (chronic obstructive pulmonary disease) ([place]) 05/21/2007  . Hyperlipidemia 01/29/2007  . Anxiety state 11/02/2006  . MDD (major depressive disorder) 11/02/2006  . Essential hypertension 11/02/2006    Rayetta Humphrey, PT CLT [phone] 03/26/2018, 9:02 AM  [facility], [place], [address] Phone: [phone]   Fax:  [phone]  Name: Kayla Choi MRN: [id number] Date of Birth: 09-15-1966

## 2018-03-29 ENCOUNTER — Telehealth (HOSPITAL_COMMUNITY): Payer: Self-pay | Admitting: Family Medicine

## 2018-03-29 NOTE — Telephone Encounter (Signed)
03/29/18  Pt cx this morning and she had no more appts on her schedule.  I called her and left a message that she would need to schedule more.  So far she has had 4 out of 8 visits

## 2018-05-04 ENCOUNTER — Ambulatory Visit: Payer: Medicaid Other | Admitting: Family Medicine

## 2018-05-14 ENCOUNTER — Ambulatory Visit: Payer: Medicaid Other | Admitting: Family Medicine

## 2018-06-02 ENCOUNTER — Encounter (HOSPITAL_COMMUNITY): Payer: Self-pay | Admitting: Physical Therapy

## 2018-06-02 NOTE — Therapy (Signed)
[facility] [address] [place], [place], [address] Phone: [phone]   Fax:  [phone]  Patient Details  Name: Kayla Choi MRN: [id number] Date of Birth: December 15, 1965   Encounter Date: 06/02/2018   PHYSICAL THERAPY DISCHARGE SUMMARY  Visits from Start of Care: 4  Current functional level related to goals / functional outcomes: Unknown pt did not return    Remaining deficits: Unknown    Education / Equipment: HEP  Plan: Patient agrees to discharge.  Patient goals were not met. Patient is being discharged due to not returning since the last visit.  ?????       Rayetta Humphrey, PT CLT [phone] 06/02/2018, 12:08 PM  [facility] [address] [place], [place], [address] Phone: [phone]   Fax:  [phone]

## 2018-08-10 ENCOUNTER — Ambulatory Visit: Payer: Medicaid Other | Admitting: Family Medicine

## 2018-08-12 ENCOUNTER — Other Ambulatory Visit: Payer: Self-pay | Admitting: *Deleted

## 2018-08-12 MED ORDER — FLUCONAZOLE 150 MG PO TABS: 150.0000 mg | ORAL_TABLET | Freq: Once | ORAL | 0 refills | Status: AC

## 2018-08-12 NOTE — Telephone Encounter (Signed)
Received call from patient.   Reports that she has been having extreme fluctuations in her blood sugars. States that she believes she has a yeast infection due to glucose imbalance. Reports that she is scheduling an OV to discuss once she returns to town.   Requested order for diflucan. Prescription sent to pharmacy for Diflucan. Advised that if S/Sx do not resolve after dosage, OV will be required.

## 2018-11-03 ENCOUNTER — Ambulatory Visit: Payer: Medicaid Other | Admitting: Family Medicine

## 2018-11-17 ENCOUNTER — Encounter: Payer: Self-pay | Admitting: Family Medicine

## 2019-02-07 ENCOUNTER — Encounter (HOSPITAL_COMMUNITY): Payer: Self-pay

## 2019-02-07 ENCOUNTER — Emergency Department (HOSPITAL_COMMUNITY): Payer: Medicaid Other

## 2019-02-07 ENCOUNTER — Inpatient Hospital Stay (HOSPITAL_COMMUNITY)
Admission: EM | Admit: 2019-02-07 | Discharge: 2019-02-10 | DRG: 195 | Disposition: A | Discharge status: Home / Self Care | Payer: Medicaid Other | Attending: Family Medicine | Admitting: Family Medicine

## 2019-02-07 ENCOUNTER — Other Ambulatory Visit: Payer: Self-pay

## 2019-02-07 DIAGNOSIS — Z79899 Other long term (current) drug therapy: Secondary | ICD-10-CM

## 2019-02-07 DIAGNOSIS — Z9181 History of falling: Secondary | ICD-10-CM

## 2019-02-07 DIAGNOSIS — J101 Influenza due to other identified influenza virus with other respiratory manifestations: Secondary | ICD-10-CM | POA: Diagnosis not present

## 2019-02-07 DIAGNOSIS — R079 Chest pain, unspecified: Secondary | ICD-10-CM | POA: Diagnosis not present

## 2019-02-07 DIAGNOSIS — R05 Cough: Secondary | ICD-10-CM | POA: Diagnosis not present

## 2019-02-07 DIAGNOSIS — Z90710 Acquired absence of both cervix and uterus: Secondary | ICD-10-CM

## 2019-02-07 DIAGNOSIS — R531 Weakness: Secondary | ICD-10-CM | POA: Diagnosis not present

## 2019-02-07 DIAGNOSIS — J449 Chronic obstructive pulmonary disease, unspecified: Secondary | ICD-10-CM | POA: Diagnosis present

## 2019-02-07 DIAGNOSIS — I10 Essential (primary) hypertension: Secondary | ICD-10-CM | POA: Diagnosis not present

## 2019-02-07 DIAGNOSIS — J410 Simple chronic bronchitis: Secondary | ICD-10-CM

## 2019-02-07 DIAGNOSIS — Z833 Family history of diabetes mellitus: Secondary | ICD-10-CM

## 2019-02-07 DIAGNOSIS — Z8249 Family history of ischemic heart disease and other diseases of the circulatory system: Secondary | ICD-10-CM

## 2019-02-07 DIAGNOSIS — E119 Type 2 diabetes mellitus without complications: Secondary | ICD-10-CM

## 2019-02-07 DIAGNOSIS — R0602 Shortness of breath: Secondary | ICD-10-CM | POA: Diagnosis not present

## 2019-02-07 DIAGNOSIS — Z9049 Acquired absence of other specified parts of digestive tract: Secondary | ICD-10-CM

## 2019-02-07 DIAGNOSIS — Z888 Allergy status to other drugs, medicaments and biological substances status: Secondary | ICD-10-CM

## 2019-02-07 DIAGNOSIS — Z881 Allergy status to other antibiotic agents status: Secondary | ICD-10-CM

## 2019-02-07 DIAGNOSIS — Z7984 Long term (current) use of oral hypoglycemic drugs: Secondary | ICD-10-CM

## 2019-02-07 DIAGNOSIS — Z87892 Personal history of anaphylaxis: Secondary | ICD-10-CM

## 2019-02-07 DIAGNOSIS — Z91040 Latex allergy status: Secondary | ICD-10-CM

## 2019-02-07 DIAGNOSIS — E785 Hyperlipidemia, unspecified: Secondary | ICD-10-CM | POA: Diagnosis present

## 2019-02-07 DIAGNOSIS — R000 Tachycardia, unspecified: Secondary | ICD-10-CM

## 2019-02-07 DIAGNOSIS — Z885 Allergy status to narcotic agent status: Secondary | ICD-10-CM

## 2019-02-07 LAB — CBC
HCT: 39.6 % (ref 36.0–46.0)
Hemoglobin: 12.8 g/dL (ref 12.0–15.0)
MCH: 30.6 pg (ref 26.0–34.0)
MCHC: 32.3 g/dL (ref 30.0–36.0)
MCV: 94.7 fL (ref 80.0–100.0)
NRBC: 0 % (ref 0.0–0.2)
PLATELETS: 326 10*3/uL (ref 150–400)
RBC: 4.18 MIL/uL (ref 3.87–5.11)
RDW: 12.1 % (ref 11.5–15.5)
WBC: 10.5 10*3/uL (ref 4.0–10.5)

## 2019-02-07 LAB — COMPREHENSIVE METABOLIC PANEL
ALK PHOS: 54 U/L (ref 38–126)
ALT: 38 U/L (ref 0–44)
ANION GAP: 10 (ref 5–15)
AST: 35 U/L (ref 15–41)
Albumin: 4 g/dL (ref 3.5–5.0)
BUN: 17 mg/dL (ref 6–20)
CALCIUM: 9.1 mg/dL (ref 8.9–10.3)
CO2: 24 mmol/L (ref 22–32)
CREATININE: 0.73 mg/dL (ref 0.44–1.00)
Chloride: 102 mmol/L (ref 98–111)
GFR calc non Af Amer: >60 mL/min (ref >60)
Glucose, Bld: 264 mg/dL — ABNORMAL HIGH (ref 70–99)
Potassium: 3.6 mmol/L (ref 3.5–5.1)
SODIUM: 136 mmol/L (ref 135–145)
TOTAL PROTEIN: 7.5 g/dL (ref 6.5–8.1)
Total Bilirubin: 1.4 mg/dL — ABNORMAL HIGH (ref 0.3–1.2)

## 2019-02-07 LAB — INFLUENZA PANEL BY PCR (TYPE A & B)
Influenza A By PCR: POSITIVE — AB
Influenza B By PCR: NEGATIVE

## 2019-02-07 LAB — GLUCOSE, CAPILLARY: Glucose-Capillary: 271 mg/dL — ABNORMAL HIGH (ref 70–99)

## 2019-02-07 LAB — LIPASE, BLOOD: Lipase: 24 U/L (ref 11–51)

## 2019-02-07 MED ORDER — OSELTAMIVIR PHOSPHATE 75 MG PO CAPS
75.0000 mg | ORAL_CAPSULE | Freq: Once | ORAL | Status: AC
  Administered 2019-02-07: 75 mg via ORAL
  Filled 2019-02-07: qty 1

## 2019-02-07 MED ORDER — INSULIN ASPART 100 UNIT/ML ~~LOC~~ SOLN
0.0000 [IU] | Freq: Three times a day (TID) | SUBCUTANEOUS | Status: DC
  Administered 2019-02-08: 1 [IU] via SUBCUTANEOUS
  Administered 2019-02-08 – 2019-02-09 (×4): 2 [IU] via SUBCUTANEOUS
  Administered 2019-02-10: 3 [IU] via SUBCUTANEOUS

## 2019-02-07 MED ORDER — SODIUM CHLORIDE 0.9% FLUSH: 3.0000 mL | Freq: Once | INTRAVENOUS | Status: DC

## 2019-02-07 MED ORDER — INSULIN ASPART 100 UNIT/ML ~~LOC~~ SOLN
0.0000 [IU] | Freq: Every day | SUBCUTANEOUS | Status: DC
  Administered 2019-02-08: 3 [IU] via SUBCUTANEOUS

## 2019-02-07 MED ORDER — IBUPROFEN 600 MG PO TABS
600.0000 mg | ORAL_TABLET | Freq: Four times a day (QID) | ORAL | Status: DC | PRN
  Filled 2019-02-07: qty 1

## 2019-02-07 MED ORDER — ACETAMINOPHEN 325 MG PO TABS
650.0000 mg | ORAL_TABLET | Freq: Four times a day (QID) | ORAL | Status: DC | PRN
  Administered 2019-02-08 – 2019-02-09 (×2): 650 mg via ORAL
  Filled 2019-02-07 (×2): qty 2

## 2019-02-07 MED ORDER — UMECLIDINIUM BROMIDE 62.5 MCG/INH IN AEPB
1.0000 | INHALATION_SPRAY | Freq: Every day | RESPIRATORY_TRACT | Status: DC
  Administered 2019-02-09 – 2019-02-10 (×2): 1 via RESPIRATORY_TRACT
  Filled 2019-02-07: qty 7

## 2019-02-07 MED ORDER — ENOXAPARIN SODIUM 40 MG/0.4ML ~~LOC~~ SOLN
40.0000 mg | SUBCUTANEOUS | Status: DC
  Administered 2019-02-08 (×2): 40 mg via SUBCUTANEOUS
  Filled 2019-02-07 (×2): qty 0.4

## 2019-02-07 MED ORDER — ACETAMINOPHEN 650 MG RE SUPP: 650.0000 mg | Freq: Four times a day (QID) | RECTAL | Status: DC | PRN

## 2019-02-07 MED ORDER — ROSUVASTATIN CALCIUM 20 MG PO TABS
40.0000 mg | ORAL_TABLET | Freq: Every day | ORAL | Status: DC
  Administered 2019-02-08 – 2019-02-09 (×2): 40 mg via ORAL
  Filled 2019-02-07: qty 1
  Filled 2019-02-07 (×2): qty 2

## 2019-02-07 MED ORDER — ALBUTEROL SULFATE (2.5 MG/3ML) 0.083% IN NEBU: 2.5000 mg | INHALATION_SOLUTION | RESPIRATORY_TRACT | Status: DC | PRN

## 2019-02-07 MED ORDER — OSELTAMIVIR PHOSPHATE 75 MG PO CAPS
75.0000 mg | ORAL_CAPSULE | Freq: Two times a day (BID) | ORAL | Status: DC
  Administered 2019-02-08 – 2019-02-10 (×5): 75 mg via ORAL
  Filled 2019-02-07 (×5): qty 1

## 2019-02-07 MED ORDER — TIOTROPIUM BROMIDE MONOHYDRATE 2.5 MCG/ACT IN AERS: 2.0000 | INHALATION_SPRAY | Freq: Every day | RESPIRATORY_TRACT | Status: DC

## 2019-02-07 MED ORDER — ONDANSETRON HCL 4 MG/2ML IJ SOLN
4.0000 mg | Freq: Four times a day (QID) | INTRAMUSCULAR | Status: DC | PRN
  Administered 2019-02-08: 4 mg via INTRAVENOUS
  Filled 2019-02-07: qty 2

## 2019-02-07 MED ORDER — POTASSIUM CHLORIDE IN NACL 20-0.9 MEQ/L-% IV SOLN
INTRAVENOUS | Status: AC
  Administered 2019-02-08 (×2): via INTRAVENOUS

## 2019-02-07 MED ORDER — AMLODIPINE BESYLATE 5 MG PO TABS
5.0000 mg | ORAL_TABLET | Freq: Every day | ORAL | Status: DC
  Administered 2019-02-08 – 2019-02-10 (×3): 5 mg via ORAL
  Filled 2019-02-07 (×3): qty 1

## 2019-02-07 MED ORDER — SODIUM CHLORIDE 0.9 % IV BOLUS
500.0000 mL | Freq: Once | INTRAVENOUS | Status: AC
  Administered 2019-02-08: 500 mL via INTRAVENOUS

## 2019-02-07 MED ORDER — ONDANSETRON HCL 4 MG PO TABS: 4.0000 mg | ORAL_TABLET | Freq: Four times a day (QID) | ORAL | Status: DC | PRN

## 2019-02-07 MED ORDER — OXYCODONE HCL 5 MG PO TABS
10.0000 mg | ORAL_TABLET | Freq: Four times a day (QID) | ORAL | Status: DC | PRN
  Administered 2019-02-08 (×2): 10 mg via ORAL
  Filled 2019-02-07 (×2): qty 2

## 2019-02-07 MED ORDER — DIPHENHYDRAMINE HCL 25 MG PO CAPS
25.0000 mg | ORAL_CAPSULE | Freq: Once | ORAL | Status: AC
  Administered 2019-02-08: 25 mg via ORAL
  Filled 2019-02-07: qty 1

## 2019-02-07 MED ORDER — IBUPROFEN 400 MG PO TABS
400.0000 mg | ORAL_TABLET | Freq: Once | ORAL | Status: AC
  Administered 2019-02-07: 400 mg via ORAL
  Filled 2019-02-07: qty 1

## 2019-02-07 MED ORDER — KETOROLAC TROMETHAMINE 30 MG/ML IJ SOLN
15.0000 mg | Freq: Once | INTRAMUSCULAR | Status: AC
  Administered 2019-02-07: 15 mg via INTRAVENOUS
  Filled 2019-02-07: qty 1

## 2019-02-07 MED ORDER — SODIUM CHLORIDE 0.9 % IV BOLUS
1000.0000 mL | Freq: Once | INTRAVENOUS | Status: AC
  Administered 2019-02-07: 1000 mL via INTRAVENOUS

## 2019-02-07 MED ORDER — LISINOPRIL 10 MG PO TABS
40.0000 mg | ORAL_TABLET | Freq: Every day | ORAL | Status: DC
  Administered 2019-02-08 – 2019-02-10 (×3): 40 mg via ORAL
  Filled 2019-02-07 (×3): qty 4

## 2019-02-07 NOTE — ED Notes (Addendum)
ED TO INPATIENT HANDOFF REPORT  ED Nurse Name and Phone #:   Leverne Humbles [phone]  S Name/Age/Gender Kayla Choi 53 y.o. female Room/Bed: APA08/APA08  Code Status   Code Status: Full Code  Home/SNF/Other Home Patient oriented to: self, place, time and situation Is this baseline? Yes   Triage Complete: Triage complete  Chief Complaint cough,congested (copd)  Triage Note Pt reports to ED with complaints of cough and congestion which started yesterday. Pt states cough is productive. Pt also complaining of nausea, vomiting, and diarrhea which started this morning. Pt states she has vomited x 3 today. Pt denies abdominal pain.    Allergies Allergies  Allergen Reactions  . Aspirin Shortness Of Breath and Itching  . Metformin And Related Anaphylaxis  . Other Anaphylaxis    Green peas  . Latex     REACTION: Rash and itching  . Neosporin [Neomycin-Bacitracin Zn-Polymyx] Other (See Comments)    Reaction: swelling,rash and skin peeling  . Oxycodone Itching    Level of Care/Admitting Diagnosis ED Disposition    ED Disposition [facility] [[id number]]  Level of Care: Med-Surg [16]  Diagnosis: Influenza A [027253]  Admitting Physician: Vianne Bulls [[id number]]  Attending Physician: Vianne Bulls [[id number]]  PT Class (Do Not Modify): Observation [104]  PT Acc Code (Do Not Modify): Observation [10022]       B Medical/Surgery History Past Medical History:  Diagnosis Date  . Acid reflux   . ADD (attention deficit disorder)   . Anemia    iron deficinecy  . Anxiety    with social phobia  . Asthma   . Balance problem   . Chest pain    that occurs with anxiety  . COPD (chronic obstructive pulmonary disease) ([place])   . Depression   . Diabetes mellitus   . Glaucoma   . Hyperlipidemia   . Hypertension   . Normal cardiac stress test 12/2015   low risk study  . Panic attack   . Prediabetes   . Recurrent falls   .  Social phobia    Past Surgical History:  Procedure Laterality Date  . ABDOMINAL HYSTERECTOMY     fibroids, both ovaries left intact  . ANKLE SURGERY    . CHOLECYSTECTOMY    . COSMETIC SURGERY     elbow  . FRACTURE SURGERY     Recurrent elbow surgery  . LIPOMA EXCISION  04/30/2012   Procedure: EXCISION LIPOMA;  Surgeon: Donato Heinz, MD;  Location: [facility];  Service: General;  Laterality: Right;  Excision soft tissue mass right thigh  . ORIF ANKLE FRACTURE Right 12/15/2017   Procedure: OPEN REDUCTION INTERNAL FIXATION (ORIF) RIGHT ANKLE FRACTURE;  Surgeon: Renette Butters, MD;  Location: [place];  Service: Orthopedics;  Laterality: Right;  . right elbow     reconstruction     A IV Location/Drains/Wounds Patient Lines/Drains/Airways Status   Active Line/Drains/Airways    Name:   Placement date:   Placement time:   Site:   Days:   Peripheral IV 02/07/19 Right Hand   02/07/19    2058    Hand   less than 1   Incision (Closed) 12/15/17 Leg Right   12/15/17    1003     419          Intake/Output Last 24 hours  Intake/Output Summary (Last 24 hours) at 02/07/2019 2248 Last data filed at 02/07/2019 2218 Gross per 24  hour  Intake 2000 ml  Output -  Net 2000 ml    Labs/Imaging Results for orders placed or performed during the hospital encounter of 02/07/19 (from the past 48 hour(s))  Lipase, blood     Status: None   Collection Time: 02/07/19  4:20 PM  Result Value Ref Range   Lipase 24 11 - 51 U/L    Comment: Performed at [facility], [address], [place], [place] [address]  Comprehensive metabolic panel     Status: Abnormal   Collection Time: 02/07/19  4:20 PM  Result Value Ref Range   Sodium 136 135 - 145 mmol/L   Potassium 3.6 3.5 - 5.1 mmol/L   Chloride 102 98 - 111 mmol/L   CO2 24 22 - 32 mmol/L   Glucose, Bld 264 (H) 70 - 99 mg/dL   BUN 17 6 - 20 mg/dL   Creatinine, Ser 0.73 0.44 - 1.00 mg/dL   Calcium 9.1 8.9 - 10.3 mg/dL   Total Protein 7.5  6.5 - 8.1 g/dL   Albumin 4.0 3.5 - 5.0 g/dL   AST 35 15 - 41 U/L   ALT 38 0 - 44 U/L   Alkaline Phosphatase 54 38 - 126 U/L   Total Bilirubin 1.4 (H) 0.3 - 1.2 mg/dL   GFR calc non Af Amer >60 >60 mL/min   GFR calc Af Amer >60 >60 mL/min   Anion gap 10 5 - 15    Comment: Performed at [facility], [address] [place], [place] [address]  CBC     Status: None   Collection Time: 02/07/19  4:20 PM  Result Value Ref Range   WBC 10.5 4.0 - 10.5 K/uL   RBC 4.18 3.87 - 5.11 MIL/uL   Hemoglobin 12.8 12.0 - 15.0 g/dL   HCT 39.6 36.0 - 46.0 %   MCV 94.7 80.0 - 100.0 fL   MCH 30.6 26.0 - 34.0 pg   MCHC 32.3 30.0 - 36.0 g/dL   RDW 12.1 11.5 - 15.5 %   Platelets 326 150 - 400 K/uL   nRBC 0.0 0.0 - 0.2 %    Comment: Performed at [facility], [address], [place], [place] [address]  Influenza panel by PCR (type A & B)     Status: Abnormal   Collection Time: 02/07/19  8:49 PM  Result Value Ref Range   Influenza A By PCR POSITIVE (A) NEGATIVE   Influenza B By PCR NEGATIVE NEGATIVE    Comment: (NOTE) The Xpert Xpress Flu assay is intended as an aid in the diagnosis of  influenza and should not be used as a sole basis for treatment.  This  assay is FDA approved for nasopharyngeal swab specimens only. Nasal  washings and aspirates are unacceptable for Xpert Xpress Flu testing. Performed at [facility], [address], [place], [place] [address]    Dg Chest 2 View  Result Date: 02/07/2019 CLINICAL DATA:  Cough and shortness of breath. EXAM: CHEST - 2 VIEW COMPARISON:  May 22, 2016 FINDINGS: The heart size and mediastinal contours are within normal limits. Both lungs are clear. The visualized skeletal structures are unremarkable. IMPRESSION: No active cardiopulmonary disease. Electronically Signed   By: Dorise Bullion III M.D   On: 02/07/2019 20:07    Pending Labs Unresulted Labs (From admission, onward)    Start     Ordered   02/14/19 0500  Creatinine, serum  (enoxaparin (LOVENOX)     CrCl >/= 30 ml/min)  Weekly,  R    Comments:  while on enoxaparin therapy    02/07/19 2225   02/08/19 0500  HIV antibody (Routine Testing)  Tomorrow morning,   R     02/07/19 2225   02/08/19 0500  Comprehensive metabolic panel  Tomorrow morning,   R     02/07/19 2225   02/08/19 0500  Magnesium  Tomorrow morning,   R     02/07/19 2225   02/08/19 0500  CBC  Tomorrow morning,   R     02/07/19 2225   02/07/19 1602  Urinalysis, Routine w reflex microscopic  ONCE - STAT,   STAT     02/07/19 1601          Vitals/Pain Today's Vitals   02/07/19 1945 02/07/19 2000 02/07/19 2015 02/07/19 2030  BP:  (!) 173/97  140/83  Pulse: (!) 127 (!) 130 (!) 128   Resp: 17 (!) 28 (!) 30 (!) 29  Temp:    (!) 102.5 F (39.2 C)  TempSrc:    Oral  SpO2: 94% 96% 94%   Weight:      Height:      PainSc:        Isolation Precautions Droplet precaution  Medications Medications  sodium chloride flush (NS) 0.9 % injection 3 mL (3 mLs Intravenous Not Given 02/07/19 2217)  amLODipine (NORVASC) tablet 5 mg (has no administration in time range)  rosuvastatin (CRESTOR) tablet 40 mg (has no administration in time range)  lisinopril (PRINIVIL,ZESTRIL) tablet 40 mg (has no administration in time range)  albuterol (PROVENTIL) (2.5 MG/3ML) 0.083% nebulizer solution 2.5 mg (has no administration in time range)  enoxaparin (LOVENOX) injection 40 mg (has no administration in time range)  0.9 % NaCl with KCl 20 mEq/ L  infusion (has no administration in time range)  acetaminophen (TYLENOL) tablet 650 mg (has no administration in time range)    Or  acetaminophen (TYLENOL) suppository 650 mg (has no administration in time range)  ibuprofen (ADVIL,MOTRIN) tablet 600 mg (has no administration in time range)  ondansetron (ZOFRAN) tablet 4 mg (has no administration in time range)    Or  ondansetron (ZOFRAN) injection 4 mg (has no administration in time range)  oxyCODONE (Oxy IR/ROXICODONE) immediate release tablet 10 mg  (has no administration in time range)  insulin aspart (novoLOG) injection 0-9 Units (has no administration in time range)  insulin aspart (novoLOG) injection 0-5 Units (has no administration in time range)  oseltamivir (TAMIFLU) capsule 75 mg (has no administration in time range)  umeclidinium bromide (INCRUSE ELLIPTA) 62.5 MCG/INH 1 puff (has no administration in time range)  sodium chloride 0.9 % bolus 1,000 mL (0 mLs Intravenous Stopped 02/07/19 2217)  sodium chloride 0.9 % bolus 1,000 mL (0 mLs Intravenous Stopped 02/07/19 2218)  ketorolac (TORADOL) 30 MG/ML injection 15 mg (15 mg Intravenous Given 02/07/19 2103)  oseltamivir (TAMIFLU) capsule 75 mg (75 mg Oral Given 02/07/19 2217)  ibuprofen (ADVIL,MOTRIN) tablet 400 mg (400 mg Oral Given 02/07/19 2217)    Mobility walks Low fall risk   Focused Assessments Cardiac Assessment Handoff:  Cardiac Rhythm: Sinus tachycardia Lab Results  Component Value Date   CKTOTAL 72 09/22/2010   CKMB 0.6 09/22/2010   TROPONINI <0.03 05/22/2016   Lab Results  Component Value Date   DDIMER 0.44 06/25/2014   Does the Patient currently have chest pain? No     R Recommendations: See Admitting Provider Note  Report given to:  Brandy   Additional Notes:  Pt needs  to take benadryl when taking oxycodone

## 2019-02-07 NOTE — ED Notes (Signed)
Pt voided without providing sample

## 2019-02-07 NOTE — ED Triage Notes (Signed)
Pt reports to ED with complaints of cough and congestion which started yesterday. Pt states cough is productive. Pt also complaining of nausea, vomiting, and diarrhea which started this morning. Pt states she has vomited x 3 today. Pt denies abdominal pain.

## 2019-02-07 NOTE — H&P (Signed)
History and Physical    Kayla Choi OVF:[id number] DOB: 1966-04-30 DOA: 02/07/2019  PCP: Kayla Rossetti, MD   Patient coming from: Home   Chief Complaint: Cough, congestion, N/V/D   HPI: Kayla Choi is a 53 y.o. female with medical history significant for COPD, hypertension, and type 2 diabetes mellitus, now presenting to the emergency department for evaluation of cough, congestion, nausea, vomiting, and diarrhea.  Patient reports that she was in her usual state of health until yesterday when she developed a nonproductive cough and some sinus congestion.  This morning, she developed nausea, has had a few episodes of nonbloody vomiting, and has had some loose stool.  She denies any abdominal pain.  She does report chills and a general malaise.  She denies any chest pain or palpitations.  She reports chronic mild lower extremity swelling that is unchanged.  She did not receive the influenza vaccine this season and is unaware of any sick contacts.  No recent travel.  She has been taking over-the-counter cold and flu preparations without relief.   ED Course: Upon arrival to the ED, patient is found to be febrile to 39.2 C, saturating mid 90s on room air, tachypneic to 30, tachycardic in the 130s, and with stable blood pressure.  Chest x-ray is negative for acute cardiopulmonary disease.  Chemistry panel is notable for glucose of 264 and bilirubin 1.4.  CBC is unremarkable.  Influenza PCR is positive for influenza A.  Patient was given 2 L normal saline, Advil, Toradol, and Tamiflu in the ED.  Despite completed the IV fluids, she remains tachycardic in the 120s at rest, 140s with slight exertion, is generally uncomfortable though not in any acute respiratory distress, and she will be observed in the hospital for ongoing evaluation and management.  Review of Systems:  All other systems reviewed and apart from HPI, are negative.  Past Medical History:  Diagnosis Date  . Acid reflux   . ADD  (attention deficit disorder)   . Anemia    iron deficinecy  . Anxiety    with social phobia  . Asthma   . Balance problem   . Chest pain    that occurs with anxiety  . COPD (chronic obstructive pulmonary disease) ([place])   . Depression   . Diabetes mellitus   . Glaucoma   . Hyperlipidemia   . Hypertension   . Normal cardiac stress test 12/2015   low risk study  . Panic attack   . Prediabetes   . Recurrent falls   . Social phobia     Past Surgical History:  Procedure Laterality Date  . ABDOMINAL HYSTERECTOMY     fibroids, both ovaries left intact  . ANKLE SURGERY    . CHOLECYSTECTOMY    . COSMETIC SURGERY     elbow  . FRACTURE SURGERY     Recurrent elbow surgery  . LIPOMA EXCISION  04/30/2012   Procedure: EXCISION LIPOMA;  Surgeon: Kayla Heinz, MD;  Location: [facility];  Service: General;  Laterality: Right;  Excision soft tissue mass right thigh  . ORIF ANKLE FRACTURE Right 12/15/2017   Procedure: OPEN REDUCTION INTERNAL FIXATION (ORIF) RIGHT ANKLE FRACTURE;  Surgeon: Kayla Butters, MD;  Location: [place];  Service: Orthopedics;  Laterality: Right;  . right elbow     reconstruction     reports that she has never smoked. She has never used smokeless tobacco. She reports that she does not drink alcohol or use drugs.  Allergies  Allergen Reactions  . Aspirin Shortness Of Breath and Itching  . Metformin And Related Anaphylaxis  . Other Anaphylaxis    Green peas  . Latex     REACTION: Rash and itching  . Neosporin [Neomycin-Bacitracin Zn-Polymyx] Other (See Comments)    Reaction: swelling,rash and skin peeling  . Oxycodone Itching    Family History  Problem Relation Age of Onset  . Hypertension Mother   . Diabetes Father   . Heart disease Father   . Anesthesia problems Neg Hx   . Malignant hyperthermia Neg Hx   . Hypotension Neg Hx   . Pseudochol deficiency Neg Hx      Prior to Admission medications   Medication Sig Start Date End Date  Taking? Authorizing Provider  albuterol (PROVENTIL HFA;VENTOLIN HFA) 108 (90 Base) MCG/ACT inhaler Inhale 2 puffs into the lungs every 4 (four) hours as needed for wheezing or shortness of breath. 04/10/17  Yes Kayla Choi, Kayla Nunnery, MD  albuterol (PROVENTIL) (2.5 MG/3ML) 0.083% nebulizer solution Take 3 mLs (2.5 mg total) by nebulization every 4 (four) hours as needed for wheezing or shortness of breath. 04/10/17  Yes Kayla Choi, Kayla Nunnery, MD  amLODipine (NORVASC) 5 MG tablet Take 1 tablet (5 mg total) by mouth daily. 05/11/17  Yes Kayla Choi, Kayla Nunnery, MD  Chlorphen-Phenyleph-ASA (ALKA-SELTZER PLUS COLD) 2-7.8-325 MG TBEF Take 1 tablet by mouth daily as needed (for cold and flu).   Yes [provider]  diphenhydrAMINE-PE-APAP (THERAFLU EXPRESSMAX) 12.5-5-325 MG/15ML LIQD Take 5-10 mLs by mouth daily as needed (for cold and flu symptoms).   Yes [provider]  lisinopril-hydrochlorothiazide (PRINZIDE,ZESTORETIC) 20-12.5 MG tablet Take 2 tablets by mouth daily. 04/10/17  Yes [place], Kayla Nunnery, MD  metFORMIN (GLUCOPHAGE) 500 MG tablet Take 1 tablet (500 mg total) by mouth daily with breakfast. 04/14/17  Yes Kayla Choi, Kayla Nunnery, MD  naproxen sodium (ALEVE) 220 MG tablet Take 440 mg by mouth daily as needed (for pain).   Yes [provider]  rosuvastatin (CRESTOR) 40 MG tablet Take 1 tablet (40 mg total) by mouth daily. 01/08/18  Yes Kayla Choi, Kayla Nunnery, MD  Tiotropium Bromide Monohydrate (SPIRIVA RESPIMAT) 2.5 MCG/ACT AERS 2 puff daily Patient taking differently: Inhale 2 puffs into the lungs daily.  01/01/18  Yes Kayla Rossetti, MD    Physical Exam: Vitals:   02/07/19 1945 02/07/19 2000 02/07/19 2015 02/07/19 2030  BP:  (!) 173/97  140/83  Pulse: (!) 127 (!) 130 (!) 128   Resp: 17 (!) 28 (!) 30 (!) 29  Temp:    (!) 102.5 F (39.2 C)  TempSrc:    Oral  SpO2: 94% 96% 94%   Weight:      Height:         Constitutional: NAD, calm  Eyes: PERTLA, lids and conjunctivae normal ENMT: Mucous  membranes are moist. Posterior pharynx clear of any exudate or lesions.   Neck: normal, supple, no masses, no thyromegaly Respiratory: clear to auscultation bilaterally, no wheezing, no crackles. Normal respiratory effort.    Cardiovascular: Rate 120 and regular. No extremity edema. Abdomen: No distension, no tenderness, soft. Bowel sounds normal.  Musculoskeletal: no clubbing / cyanosis. No joint deformity upper and lower extremities.   Skin: no significant rashes, lesions, ulcers. Warm, dry, well-perfused. Neurologic: no facial asymmetry. Sensation intact. Moving all extremities.  Psychiatric:  Alert and oriented x 3. Calm, cooperative.    Labs on Admission: I have personally reviewed following labs and imaging studies  CBC: Recent Labs  Lab  02/07/19 1620  WBC 10.5  HGB 12.8  HCT 39.6  MCV 94.7  PLT 361   Basic Metabolic Panel: Recent Labs  Lab 02/07/19 1620  NA 136  K 3.6  CL 102  CO2 24  GLUCOSE 264*  BUN 17  CREATININE 0.73  CALCIUM 9.1   GFR: Estimated Creatinine Clearance: 79.2 mL/min (by C-G formula based on SCr of 0.73 mg/dL). Liver Function Tests: Recent Labs  Lab 02/07/19 1620  AST 35  ALT 38  ALKPHOS 54  BILITOT 1.4*  PROT 7.5  ALBUMIN 4.0   Recent Labs  Lab 02/07/19 1620  LIPASE 24   No results for input(s): AMMONIA in the last 168 hours. Coagulation Profile: No results for input(s): INR, PROTIME in the last 168 hours. Cardiac Enzymes: No results for input(s): CKTOTAL, CKMB, CKMBINDEX, TROPONINI in the last 168 hours. BNP (last 3 results) No results for input(s): PROBNP in the last 8760 hours. HbA1C: No results for input(s): HGBA1C in the last 72 hours. CBG: No results for input(s): GLUCAP in the last 168 hours. Lipid Profile: No results for input(s): CHOL, HDL, LDLCALC, TRIG, CHOLHDL, LDLDIRECT in the last 72 hours. Thyroid Function Tests: No results for input(s): TSH, T4TOTAL, FREET4, T3FREE, THYROIDAB in the last 72 hours. Anemia  Panel: No results for input(s): VITAMINB12, FOLATE, FERRITIN, TIBC, IRON, RETICCTPCT in the last 72 hours. Urine analysis:    Component Value Date/Time   COLORURINE YELLOW 05/31/2015 1505   APPEARANCEUR CLOUDY (A) 05/31/2015 1505   LABSPEC >1.030 (H) 05/31/2015 1505   PHURINE 7.5 05/31/2015 1505   GLUCOSEU NEG 05/31/2015 1505   HGBUR TRACE (A) 05/31/2015 1505   HGBUR negative 07/11/2009 0947   BILIRUBINUR SMALL (A) 05/31/2015 1505   KETONESUR TRACE (A) 05/31/2015 1505   PROTEINUR 30 (A) 05/31/2015 1505   UROBILINOGEN 1 05/31/2015 1505   NITRITE NEG 05/31/2015 1505   LEUKOCYTESUR LARGE (A) 05/31/2015 1505   Sepsis Labs: @LABRCNTIP (procalcitonin:4,lacticidven:4) )No results found for this or any previous visit (from the past 240 hour(s)).   Radiological Exams on Admission: Dg Chest 2 View  Result Date: 02/07/2019 CLINICAL DATA:  Cough and shortness of breath. EXAM: CHEST - 2 VIEW COMPARISON:  May 22, 2016 FINDINGS: The heart size and mediastinal contours are within normal limits. Both lungs are clear. The visualized skeletal structures are unremarkable. IMPRESSION: No active cardiopulmonary disease. Electronically Signed   By: Dorise Bullion III M.D   On: 02/07/2019 20:07    EKG: Independently reviewed. Sinus tachycardia (rate 128), non-specific ST-T abnormalities.   Assessment/Plan   1. Influenza A  - Presents with 2 days of cough and congestion and 1 day of N/V/D without abdominal pain  - Found to have high fever, tachycardia to 140, unremarkable CXR and basic labs, and influenza A positive  - She was treated with analgesia, antipyretics, 2 liters of NS, and Tamiflu in ED  - She continues to have HR in 120's at rest, 140 with slight exertion, despite fluid-resuscitation and treatment of pain, fevers, and nausea  - Continue Tamiflu and supportive care with IVF, antipyretics, and antiemetics   2. Nausea, vomiting, loose stools  - Reports nausea with 3 episodes of vomiting and  loose stools on day of admission  - Exam is benign  - Likely secondary to #1  - Continue IVF hydration, antiemetics, monitor lytes, and check UA    3. Type II DM  - A1c was 6.8% one year ago  - Managed with metformin only at home, held  on admission  - Check CBG's and use a low-intensity SSI with Novolog while in hospital    4. Hypertension  - BP at goal  - Hold HCTZ while hydrating, continue lisinopril and Norvasc as tolerated   5. COPD  - No wheezing on admission   - Continue Spiriva and prn albuterol     DVT prophylaxis: Lovenox  Code Status: Full  Family Communication: Daughter updated at bedside Consults called: None Admission status: Observation     Vianne Bulls, MD [facility] Pager [phone]  If 7PM-7AM, please contact night-coverage [email or web address] Password [email or web address]  02/07/2019, 10:26 PM

## 2019-02-07 NOTE — Progress Notes (Signed)
Received report from ED nurse, KNichols. PT to transfer up.

## 2019-02-07 NOTE — ED Provider Notes (Signed)
[facility] EMERGENCY DEPARTMENT Provider Note   CSN: [id number] Arrival date & time: 02/07/19  1532    History   Chief Complaint Chief Complaint  Patient presents with  . Cough  . Emesis    HPI Kayla Choi is a 53 y.o. female.     HPI  53 year old female comes in with chief complaint of cough, vomiting, body aches and sweats.  Patient states that she has history of COPD, diabetes and she started feeling unwell today.  She started having a cough couple of days ago, however this morning she woke up feeling profoundly weak with associated nausea, vomiting, diarrhea.  She is also been having sweats, chills and body aches with subjective fevers.  She denies any known sick contacts.  Although she is not wheezing she feels like her breathing has gotten worse as well.  Past Medical History:  Diagnosis Date  . Acid reflux   . ADD (attention deficit disorder)   . Anemia    iron deficinecy  . Anxiety    with social phobia  . Asthma   . Balance problem   . Chest pain    that occurs with anxiety  . COPD (chronic obstructive pulmonary disease) ([address])   . Depression   . Diabetes mellitus   . Glaucoma   . Hyperlipidemia   . Hypertension   . Normal cardiac stress test 12/2015   low risk study  . Panic attack   . Prediabetes   . Recurrent falls   . Social phobia     Patient Active Problem List   Diagnosis Date Noted  . Influenza A 02/07/2019  . Obesity (BMI 30-39.9) 09/02/2017  . Recurrent falls 05/13/2016  . Epidermal cyst 03/10/2016  . Onychomycosis of toenail 02/28/2015  . Knee pain 10/06/2014  . Neck muscle spasm 07/02/2014  . Back pain 10/23/2013  . Lesion of mouth 01/09/2013  . Headache(784.0) 10/05/2012  . Inadequate social support 06/25/2012  . Microalbuminuria 06/25/2012  . Diabetes mellitus, type II ([place]) 04/02/2012  . Keloid scar of skin 09/03/2011  . COPD (chronic obstructive pulmonary disease) ([place]) 05/21/2007  . Hyperlipidemia 01/29/2007  . Anxiety  state 11/02/2006  . MDD (major depressive disorder) 11/02/2006  . Essential hypertension 11/02/2006    Past Surgical History:  Procedure Laterality Date  . ABDOMINAL HYSTERECTOMY     fibroids, both ovaries left intact  . ANKLE SURGERY    . CHOLECYSTECTOMY    . COSMETIC SURGERY     elbow  . FRACTURE SURGERY     Recurrent elbow surgery  . LIPOMA EXCISION  04/30/2012   Procedure: EXCISION LIPOMA;  Surgeon: Donato Heinz, MD;  Location: [facility];  Service: General;  Laterality: Right;  Excision soft tissue mass right thigh  . ORIF ANKLE FRACTURE Right 12/15/2017   Procedure: OPEN REDUCTION INTERNAL FIXATION (ORIF) RIGHT ANKLE FRACTURE;  Surgeon: Renette Butters, MD;  Location: [place];  Service: Orthopedics;  Laterality: Right;  . right elbow     reconstruction     OB History    Gravida  2   Para  2   Term  2   Preterm      AB      Living  2     SAB      TAB      Ectopic      Multiple      Live Births               Home  Medications    Prior to Admission medications   Medication Sig Start Date End Date Taking? Authorizing Provider  albuterol (PROVENTIL HFA;VENTOLIN HFA) 108 (90 Base) MCG/ACT inhaler Inhale 2 puffs into the lungs every 4 (four) hours as needed for wheezing or shortness of breath. 04/10/17  Yes [place], Modena Nunnery, MD  albuterol (PROVENTIL) (2.5 MG/3ML) 0.083% nebulizer solution Take 3 mLs (2.5 mg total) by nebulization every 4 (four) hours as needed for wheezing or shortness of breath. 04/10/17  Yes [place], Modena Nunnery, MD  amLODipine (NORVASC) 5 MG tablet Take 1 tablet (5 mg total) by mouth daily. 05/11/17  Yes [place], Modena Nunnery, MD  Chlorphen-Phenyleph-ASA (ALKA-SELTZER PLUS COLD) 2-7.8-325 MG TBEF Take 1 tablet by mouth daily as needed (for cold and flu).   Yes [provider]  diphenhydrAMINE-PE-APAP (THERAFLU EXPRESSMAX) 12.5-5-325 MG/15ML LIQD Take 5-10 mLs by mouth daily as needed (for cold and flu symptoms).   Yes  [provider]  lisinopril-hydrochlorothiazide (PRINZIDE,ZESTORETIC) 20-12.5 MG tablet Take 2 tablets by mouth daily. 04/10/17  Yes [place], Modena Nunnery, MD  metFORMIN (GLUCOPHAGE) 500 MG tablet Take 1 tablet (500 mg total) by mouth daily with breakfast. 04/14/17  Yes [place], Modena Nunnery, MD  naproxen sodium (ALEVE) 220 MG tablet Take 440 mg by mouth daily as needed (for pain).   Yes [provider]  rosuvastatin (CRESTOR) 40 MG tablet Take 1 tablet (40 mg total) by mouth daily. 01/08/18  Yes [place], Modena Nunnery, MD  Tiotropium Bromide Monohydrate (SPIRIVA RESPIMAT) 2.5 MCG/ACT AERS 2 puff daily Patient taking differently: Inhale 2 puffs into the lungs daily.  01/01/18  Yes [place], Modena Nunnery, MD    Family History Family History  Problem Relation Age of Onset  . Hypertension Mother   . Diabetes Father   . Heart disease Father   . Anesthesia problems Neg Hx   . Malignant hyperthermia Neg Hx   . Hypotension Neg Hx   . Pseudochol deficiency Neg Hx     Social History Social History   Tobacco Use  . Smoking status: Never Smoker  . Smokeless tobacco: Never Used  Substance Use Topics  . Alcohol use: No    Alcohol/week: 0.0 standard drinks  . Drug use: No     Allergies   Aspirin; Metformin and related; Other; Latex; Neosporin [neomycin-bacitracin zn-polymyx]; and Oxycodone   Review of Systems Review of Systems  Constitutional: Positive for activity change.  HENT: Positive for congestion.   Respiratory: Positive for cough.   Cardiovascular: Positive for chest pain.  Gastrointestinal: Positive for diarrhea, nausea and vomiting.  Allergic/Immunologic: Negative for immunocompromised state.  Neurological: Positive for weakness.  All other systems reviewed and are negative.    Physical Exam Updated Vital Signs BP 140/83   Pulse (!) 128   Temp (!) 102.5 F (39.2 C) (Oral)   Resp (!) 29   Ht 5\' 1"  (1.549 m)   Wt 82.6 kg   SpO2 94%   BMI 34.39 kg/m   Physical  Exam Vitals signs and nursing note reviewed.  Constitutional:      Appearance: She is well-developed.  HENT:     Head: Normocephalic and atraumatic.  Eyes:     Pupils: Pupils are equal, round, and reactive to light.  Neck:     Musculoskeletal: Neck supple.     Comments: No midline c-spine tenderness Cardiovascular:     Rate and Rhythm: Regular rhythm. Tachycardia present.  Pulmonary:     Effort: Pulmonary effort is normal. No respiratory distress.  Breath sounds: Normal breath sounds. No wheezing.     Comments: Tachypnea Abdominal:     General: Bowel sounds are normal. There is no distension.     Palpations: Abdomen is soft.     Tenderness: There is no abdominal tenderness.  Musculoskeletal:     Right lower leg: No edema.     Left lower leg: No edema.     Comments: No long bone tenderness - upper and lower extrmeities and no pelvic pain, instability.  Skin:    General: Skin is warm and dry.     Findings: No rash.  Neurological:     Mental Status: She is alert and oriented to person, place, and time.     Cranial Nerves: No cranial nerve deficit.      ED Treatments / Results  Labs (all labs ordered are listed, but only abnormal results are displayed) Labs Reviewed  COMPREHENSIVE METABOLIC PANEL - Abnormal; Notable for the following components:      Result Value   Glucose, Bld 264 (*)    Total Bilirubin 1.4 (*)    All other components within normal limits  INFLUENZA PANEL BY PCR (TYPE A & B) - Abnormal; Notable for the following components:   Influenza A By PCR POSITIVE (*)    All other components within normal limits  LIPASE, BLOOD  CBC  URINALYSIS, ROUTINE W REFLEX MICROSCOPIC  HIV ANTIBODY (ROUTINE TESTING W REFLEX)  COMPREHENSIVE METABOLIC PANEL  MAGNESIUM  CBC    EKG None  Radiology Dg Chest 2 View  Result Date: 02/07/2019 CLINICAL DATA:  Cough and shortness of breath. EXAM: CHEST - 2 VIEW COMPARISON:  May 22, 2016 FINDINGS: The heart size and  mediastinal contours are within normal limits. Both lungs are clear. The visualized skeletal structures are unremarkable. IMPRESSION: No active cardiopulmonary disease. Electronically Signed   By: Dorise Bullion III M.D   On: 02/07/2019 20:07    Procedures .Critical Care Performed by: Varney Biles, MD Authorized by: Varney Biles, MD   Critical care provider statement:    Critical care time (minutes):  33   Critical care time was exclusive of:  Separately billable procedures and treating other patients   Critical care was necessary to treat or prevent imminent or life-threatening deterioration of the following conditions: Profound tachycardia and tachypnea.   Critical care was time spent personally by me on the following activities:  Discussions with consultants, evaluation of patient's response to treatment, examination of patient, ordering and performing treatments and interventions, ordering and review of laboratory studies, ordering and review of radiographic studies, pulse oximetry, re-evaluation of patient's condition, obtaining history from patient or surrogate and review of old charts   (including critical care time)  Medications Ordered in ED Medications  sodium chloride flush (NS) 0.9 % injection 3 mL (3 mLs Intravenous Not Given 02/07/19 2217)  amLODipine (NORVASC) tablet 5 mg (has no administration in time range)  rosuvastatin (CRESTOR) tablet 40 mg (has no administration in time range)  lisinopril (PRINIVIL,ZESTRIL) tablet 40 mg (has no administration in time range)  albuterol (PROVENTIL) (2.5 MG/3ML) 0.083% nebulizer solution 2.5 mg (has no administration in time range)  enoxaparin (LOVENOX) injection 40 mg (has no administration in time range)  0.9 % NaCl with KCl 20 mEq/ L  infusion (has no administration in time range)  acetaminophen (TYLENOL) tablet 650 mg (has no administration in time range)    Or  acetaminophen (TYLENOL) suppository 650 mg (has no administration in  time range)  ibuprofen (  ADVIL,MOTRIN) tablet 600 mg (has no administration in time range)  ondansetron (ZOFRAN) tablet 4 mg (has no administration in time range)    Or  ondansetron (ZOFRAN) injection 4 mg (has no administration in time range)  oxyCODONE (Oxy IR/ROXICODONE) immediate release tablet 10 mg (has no administration in time range)  insulin aspart (novoLOG) injection 0-9 Units (has no administration in time range)  insulin aspart (novoLOG) injection 0-5 Units (has no administration in time range)  oseltamivir (TAMIFLU) capsule 75 mg (has no administration in time range)  umeclidinium bromide (INCRUSE ELLIPTA) 62.5 MCG/INH 1 puff (has no administration in time range)  sodium chloride 0.9 % bolus 1,000 mL (0 mLs Intravenous Stopped 02/07/19 2217)  sodium chloride 0.9 % bolus 1,000 mL (0 mLs Intravenous Stopped 02/07/19 2218)  ketorolac (TORADOL) 30 MG/ML injection 15 mg (15 mg Intravenous Given 02/07/19 2103)  oseltamivir (TAMIFLU) capsule 75 mg (75 mg Oral Given 02/07/19 2217)  ibuprofen (ADVIL,MOTRIN) tablet 400 mg (400 mg Oral Given 02/07/19 2217)     Initial Impression / Assessment and Plan / ED Course  I have reviewed the triage vital signs and the nursing notes.  Pertinent labs & imaging results that were available during my care of the patient were reviewed by me and considered in my medical decision making (see chart for details).        53 year old female comes in with chief complaint of flulike illness. Clinically it appears that she has a flu.  She has history of COPD.  She is having chest pain and shortness of breath.  She is noted to be tachycardic with heart rate in the 120s at rest.  No clinical concerns for PE based on history and exam.  Plan is to get an x-ray along with basic labs and influenza screen.  Patient appears to have turned into having influenza today.  She does have some high risk features like COPD.  10:35 PM Patient's influenza A test is positive.  She was  given 2 L of IV fluid but remained tachycardic.  Upon ambulation her heart rate jumped to the 140s.  Patient is not comfortable going home because every time she gets up she feels dizzy and feels like she might pass out.  I discussed the case with the hospitalist team who is going to admit patient as an observation.  Patient is more comfortable staying as an observation than going home at this time.  Final Clinical Impressions(s) / ED Diagnoses   Final diagnoses:  Influenza A  Tachycardia with heart rate 121-140 beats per minute    ED Discharge Orders    None       Varney Biles, MD 02/07/19 2236

## 2019-02-08 DIAGNOSIS — R531 Weakness: Secondary | ICD-10-CM | POA: Diagnosis not present

## 2019-02-08 DIAGNOSIS — J449 Chronic obstructive pulmonary disease, unspecified: Secondary | ICD-10-CM | POA: Diagnosis present

## 2019-02-08 DIAGNOSIS — E785 Hyperlipidemia, unspecified: Secondary | ICD-10-CM | POA: Diagnosis present

## 2019-02-08 DIAGNOSIS — Z8249 Family history of ischemic heart disease and other diseases of the circulatory system: Secondary | ICD-10-CM | POA: Diagnosis not present

## 2019-02-08 DIAGNOSIS — Z888 Allergy status to other drugs, medicaments and biological substances status: Secondary | ICD-10-CM | POA: Diagnosis not present

## 2019-02-08 DIAGNOSIS — Z91040 Latex allergy status: Secondary | ICD-10-CM | POA: Diagnosis not present

## 2019-02-08 DIAGNOSIS — Z9181 History of falling: Secondary | ICD-10-CM | POA: Diagnosis not present

## 2019-02-08 DIAGNOSIS — R079 Chest pain, unspecified: Secondary | ICD-10-CM | POA: Diagnosis not present

## 2019-02-08 DIAGNOSIS — Z79899 Other long term (current) drug therapy: Secondary | ICD-10-CM | POA: Diagnosis not present

## 2019-02-08 DIAGNOSIS — E119 Type 2 diabetes mellitus without complications: Secondary | ICD-10-CM | POA: Diagnosis present

## 2019-02-08 DIAGNOSIS — R000 Tachycardia, unspecified: Secondary | ICD-10-CM | POA: Diagnosis not present

## 2019-02-08 DIAGNOSIS — Z833 Family history of diabetes mellitus: Secondary | ICD-10-CM | POA: Diagnosis not present

## 2019-02-08 DIAGNOSIS — Z7984 Long term (current) use of oral hypoglycemic drugs: Secondary | ICD-10-CM | POA: Diagnosis not present

## 2019-02-08 DIAGNOSIS — Z90710 Acquired absence of both cervix and uterus: Secondary | ICD-10-CM | POA: Diagnosis not present

## 2019-02-08 DIAGNOSIS — Z87892 Personal history of anaphylaxis: Secondary | ICD-10-CM | POA: Diagnosis not present

## 2019-02-08 DIAGNOSIS — Z885 Allergy status to narcotic agent status: Secondary | ICD-10-CM | POA: Diagnosis not present

## 2019-02-08 DIAGNOSIS — Z881 Allergy status to other antibiotic agents status: Secondary | ICD-10-CM | POA: Diagnosis not present

## 2019-02-08 DIAGNOSIS — J101 Influenza due to other identified influenza virus with other respiratory manifestations: Secondary | ICD-10-CM | POA: Diagnosis not present

## 2019-02-08 DIAGNOSIS — R05 Cough: Secondary | ICD-10-CM | POA: Diagnosis not present

## 2019-02-08 DIAGNOSIS — I10 Essential (primary) hypertension: Secondary | ICD-10-CM | POA: Diagnosis present

## 2019-02-08 DIAGNOSIS — Z9049 Acquired absence of other specified parts of digestive tract: Secondary | ICD-10-CM | POA: Diagnosis not present

## 2019-02-08 DIAGNOSIS — R0602 Shortness of breath: Secondary | ICD-10-CM | POA: Diagnosis not present

## 2019-02-08 LAB — COMPREHENSIVE METABOLIC PANEL
ALT: 41 U/L (ref 0–44)
AST: 39 U/L (ref 15–41)
Albumin: 3.1 g/dL — ABNORMAL LOW (ref 3.5–5.0)
Alkaline Phosphatase: 42 U/L (ref 38–126)
Anion gap: 7 (ref 5–15)
BUN: 14 mg/dL (ref 6–20)
CO2: 23 mmol/L (ref 22–32)
Calcium: 7.8 mg/dL — ABNORMAL LOW (ref 8.9–10.3)
Chloride: 109 mmol/L (ref 98–111)
Creatinine, Ser: 0.71 mg/dL (ref 0.44–1.00)
GFR calc Af Amer: >60 mL/min (ref >60)
GFR calc non Af Amer: >60 mL/min (ref >60)
Glucose, Bld: 168 mg/dL — ABNORMAL HIGH (ref 70–99)
Potassium: 3.5 mmol/L (ref 3.5–5.1)
Sodium: 139 mmol/L (ref 135–145)
Total Bilirubin: 0.9 mg/dL (ref 0.3–1.2)
Total Protein: 6 g/dL — ABNORMAL LOW (ref 6.5–8.1)

## 2019-02-08 LAB — CBC
HEMATOCRIT: 33.6 % — AB (ref 36.0–46.0)
Hemoglobin: 10.4 g/dL — ABNORMAL LOW (ref 12.0–15.0)
MCH: 29.7 pg (ref 26.0–34.0)
MCHC: 31 g/dL (ref 30.0–36.0)
MCV: 96 fL (ref 80.0–100.0)
Platelets: 251 10*3/uL (ref 150–400)
RBC: 3.5 MIL/uL — ABNORMAL LOW (ref 3.87–5.11)
RDW: 12.7 % (ref 11.5–15.5)
WBC: 5.3 10*3/uL (ref 4.0–10.5)
nRBC: 0 % (ref 0.0–0.2)

## 2019-02-08 LAB — GLUCOSE, CAPILLARY
Glucose-Capillary: 159 mg/dL — ABNORMAL HIGH (ref 70–99)
Glucose-Capillary: 122 mg/dL — ABNORMAL HIGH (ref 70–99)

## 2019-02-08 LAB — MAGNESIUM: Magnesium: 1.6 mg/dL — ABNORMAL LOW (ref 1.7–2.4)

## 2019-02-08 MED ORDER — LEVALBUTEROL HCL 0.63 MG/3ML IN NEBU
0.6300 mg | INHALATION_SOLUTION | Freq: Four times a day (QID) | RESPIRATORY_TRACT | Status: DC | PRN
  Administered 2019-02-09: 0.63 mg via RESPIRATORY_TRACT
  Filled 2019-02-08: qty 3

## 2019-02-08 MED ORDER — GUAIFENESIN-DM 100-10 MG/5ML PO SYRP
5.0000 mL | ORAL_SOLUTION | ORAL | Status: DC | PRN
  Administered 2019-02-08: 5 mL via ORAL
  Filled 2019-02-08: qty 5

## 2019-02-08 MED ORDER — MAGNESIUM SULFATE 2 GM/50ML IV SOLN
2.0000 g | Freq: Once | INTRAVENOUS | Status: AC
  Administered 2019-02-08: 2 g via INTRAVENOUS
  Filled 2019-02-08: qty 50

## 2019-02-08 MED ORDER — DIPHENHYDRAMINE HCL 25 MG PO CAPS
25.0000 mg | ORAL_CAPSULE | Freq: Four times a day (QID) | ORAL | Status: DC | PRN
  Administered 2019-02-08: 25 mg via ORAL
  Filled 2019-02-08: qty 1

## 2019-02-08 MED ORDER — LACTATED RINGERS IV SOLN
INTRAVENOUS | Status: DC
  Administered 2019-02-08: 12:00:00 via INTRAVENOUS

## 2019-02-08 NOTE — Progress Notes (Signed)
EKG results:  Vent rate 128bpm PR interval 114ms QRS duration 110ms QT/QTC 308/449 ms  Sinus Tach with non specific ST and T wave abnormality

## 2019-02-08 NOTE — Progress Notes (Signed)
PROGRESS NOTE    Kayla Choi  EHM:[id number] DOB: 03-06-1966 DOA: 02/07/2019 PCP: Alycia Rossetti, MD   Brief Narrative:  Per HPI:  Kayla Choi is a 53 y.o. female with medical history significant for COPD, hypertension, and type 2 diabetes mellitus, now presenting to the emergency department for evaluation of cough, congestion, nausea, vomiting, and diarrhea.  Patient reports that she was in her usual state of health until yesterday when she developed a nonproductive cough and some sinus congestion.  This morning, she developed nausea, has had a few episodes of nonbloody vomiting, and has had some loose stool.  She denies any abdominal pain.  She does report chills and a general malaise.  She denies any chest pain or palpitations.  She reports chronic mild lower extremity swelling that is unchanged.  She did not receive the influenza vaccine this season and is unaware of any sick contacts.  No recent travel.  She has been taking over-the-counter cold and flu preparations without relief.   Patient was admitted with influenza A and continues to have significant tachycardia as well as mild fevers.  Assessment & Plan:   Principal Problem:   Influenza A Active Problems:   Essential hypertension   COPD (chronic obstructive pulmonary disease) ([place])   Diabetes mellitus, type II ([place])   1. Influenza A  - Presents with 2 days of cough and congestion and 1 day of N/V/D without abdominal pain  - Found to have high fever, tachycardia to 140, unremarkable CXR and basic labs, and influenza A positive  - She was treated with analgesia, antipyretics, 2 liters of NS, and Tamiflu in ED  - She continues to have HR in 120's at rest, 140 with slight exertion, despite fluid-resuscitation and treatment of pain, fevers, and nausea  - Continue Tamiflu and supportive care with IVF, antipyretics, and antiemetics   2. Nausea, vomiting, loose stools  - Reports nausea with 3 episodes of vomiting and loose  stools on day of admission  - Exam is benign  - Likely secondary to #1  - Continue IVF hydration, antiemetics, monitor lytes, and check UA    3. Type II DM -improving hyperglycemia - A1c was 6.8% one year ago  - Managed with metformin only at home, held on admission  - Check CBG's and use a low-intensity SSI with Novolog while in hospital    4. Hypertension  - Hold HCTZ while hydrating, continue lisinopril and Norvasc as tolerated  -Continue to monitor  5. COPD  - No wheezing on admission   - Continue Spiriva and prn albuterol  which has been changed to Xopenex due to tachycardia.    DVT prophylaxis: Lovenox Code Status: Full Family Communication: None at bedside Disposition Plan: Continue treatment for influenza A with supportive measures and discharge once improved.   Consultants:   None  Procedures:   None  Antimicrobials:   Tamiflu 3/2->   Subjective: Patient seen and evaluated today with a general sense of unwellness noted.  She continues to have tachycardia and weakness with mild fevers.  Objective: Vitals:   02/07/19 2329 02/07/19 2334 02/08/19 0700 02/08/19 0705  BP: (!) 175/90  (!) 147/68   Pulse: (!) 138  (!) 124 (!) 120  Resp:   (!) 21   Temp: 100 F (37.8 C)     TempSrc: Oral     SpO2: 97%  (!) 84% 99%  Weight:  86.9 kg    Height:        Intake/Output Summary (  Last 24 hours) at 02/08/2019 1153 Last data filed at 02/08/2019 0016 Gross per 24 hour  Intake 2240 ml  Output -  Net 2240 ml   Filed Weights   02/07/19 1600 02/07/19 2322 02/07/19 2334  Weight: 82.6 kg 84.6 kg 86.9 kg    Examination:  General exam: Appears calm and comfortable  Respiratory system: Clear to auscultation. Respiratory effort normal.  Currently on 2 L nasal cannula. Cardiovascular system: S1 & S2 heard, RRR. No JVD, murmurs, rubs, gallops or clicks. No pedal edema. Gastrointestinal system: Abdomen is nondistended, soft and nontender. No organomegaly or masses felt.  Normal bowel sounds heard. Central nervous system: Alert and oriented. No focal neurological deficits. Extremities: Symmetric 5 x 5 power. Skin: No rashes, lesions or ulcers Psychiatry: Judgement and insight appear normal. Mood & affect appropriate.     Data Reviewed: I have personally reviewed following labs and imaging studies  CBC: Recent Labs  Lab 02/07/19 1620 02/08/19 0528  WBC 10.5 5.3  HGB 12.8 10.4*  HCT 39.6 33.6*  MCV 94.7 96.0  PLT 326 563   Basic Metabolic Panel: Recent Labs  Lab 02/07/19 1620 02/08/19 0528  NA 136 139  K 3.6 3.5  CL 102 109  CO2 24 23  GLUCOSE 264* 168*  BUN 17 14  CREATININE 0.73 0.71  CALCIUM 9.1 7.8*  MG  --  1.6*   GFR: Estimated Creatinine Clearance: 81.4 mL/min (by C-G formula based on SCr of 0.71 mg/dL). Liver Function Tests: Recent Labs  Lab 02/07/19 1620 02/08/19 0528  AST 35 39  ALT 38 41  ALKPHOS 54 42  BILITOT 1.4* 0.9  PROT 7.5 6.0*  ALBUMIN 4.0 3.1*   Recent Labs  Lab 02/07/19 1620  LIPASE 24   No results for input(s): AMMONIA in the last 168 hours. Coagulation Profile: No results for input(s): INR, PROTIME in the last 168 hours. Cardiac Enzymes: No results for input(s): CKTOTAL, CKMB, CKMBINDEX, TROPONINI in the last 168 hours. BNP (last 3 results) No results for input(s): PROBNP in the last 8760 hours. HbA1C: No results for input(s): HGBA1C in the last 72 hours. CBG: Recent Labs  Lab 02/07/19 2330 02/08/19 0733  GLUCAP 271* 159*   Lipid Profile: No results for input(s): CHOL, HDL, LDLCALC, TRIG, CHOLHDL, LDLDIRECT in the last 72 hours. Thyroid Function Tests: No results for input(s): TSH, T4TOTAL, FREET4, T3FREE, THYROIDAB in the last 72 hours. Anemia Panel: No results for input(s): VITAMINB12, FOLATE, FERRITIN, TIBC, IRON, RETICCTPCT in the last 72 hours. Sepsis Labs: No results for input(s): PROCALCITON, LATICACIDVEN in the last 168 hours.  No results found for this or any previous visit  (from the past 240 hour(s)).       Radiology Studies: Dg Chest 2 View  Result Date: 02/07/2019 CLINICAL DATA:  Cough and shortness of breath. EXAM: CHEST - 2 VIEW COMPARISON:  May 22, 2016 FINDINGS: The heart size and mediastinal contours are within normal limits. Both lungs are clear. The visualized skeletal structures are unremarkable. IMPRESSION: No active cardiopulmonary disease. Electronically Signed   By: Dorise Bullion III M.D   On: 02/07/2019 20:07        Scheduled Meds: . amLODipine  5 mg Oral Daily  . enoxaparin (LOVENOX) injection  40 mg Subcutaneous Q24H  . insulin aspart  0-5 Units Subcutaneous QHS  . insulin aspart  0-9 Units Subcutaneous TID WC  . lisinopril  40 mg Oral Daily  . oseltamivir  75 mg Oral BID  . rosuvastatin  40 mg Oral q1800  . sodium chloride flush  3 mL Intravenous Once  . umeclidinium bromide  1 puff Inhalation Daily   Continuous Infusions: . lactated ringers    . magnesium sulfate 1 - 4 g bolus IVPB       LOS: 0 days    Time spent: 30 minutes    Cree Kunert Darleen Crocker, DO [facility] Pager [phone]  If 7PM-7AM, please contact night-coverage [email or web address] Password [email or web address] 02/08/2019, 11:53 AM

## 2019-02-09 LAB — HIV ANTIBODY (ROUTINE TESTING W REFLEX): HIV Screen 4th Generation wRfx: NONREACTIVE

## 2019-02-09 LAB — GLUCOSE, CAPILLARY
Glucose-Capillary: 174 mg/dL — ABNORMAL HIGH (ref 70–99)
Glucose-Capillary: 196 mg/dL — ABNORMAL HIGH (ref 70–99)
Glucose-Capillary: 154 mg/dL — ABNORMAL HIGH (ref 70–99)
Glucose-Capillary: 150 mg/dL — ABNORMAL HIGH (ref 70–99)

## 2019-02-09 LAB — BASIC METABOLIC PANEL
Anion gap: 6 (ref 5–15)
BUN: 9 mg/dL (ref 6–20)
CHLORIDE: 105 mmol/L (ref 98–111)
CO2: 24 mmol/L (ref 22–32)
CREATININE: 0.67 mg/dL (ref 0.44–1.00)
Calcium: 7.9 mg/dL — ABNORMAL LOW (ref 8.9–10.3)
GFR calc Af Amer: >60 mL/min (ref >60)
GFR calc non Af Amer: >60 mL/min (ref >60)
Glucose, Bld: 190 mg/dL — ABNORMAL HIGH (ref 70–99)
Potassium: 3.6 mmol/L (ref 3.5–5.1)
SODIUM: 135 mmol/L (ref 135–145)

## 2019-02-09 LAB — MAGNESIUM: Magnesium: 2 mg/dL (ref 1.7–2.4)

## 2019-02-09 LAB — CBC
HCT: 33.2 % — ABNORMAL LOW (ref 36.0–46.0)
Hemoglobin: 10.7 g/dL — ABNORMAL LOW (ref 12.0–15.0)
MCH: 31.1 pg (ref 26.0–34.0)
MCHC: 32.2 g/dL (ref 30.0–36.0)
MCV: 96.5 fL (ref 80.0–100.0)
Platelets: 230 10*3/uL (ref 150–400)
RBC: 3.44 MIL/uL — ABNORMAL LOW (ref 3.87–5.11)
RDW: 12.5 % (ref 11.5–15.5)
WBC: 6.8 10*3/uL (ref 4.0–10.5)
nRBC: 0 % (ref 0.0–0.2)

## 2019-02-09 MED ORDER — IBUPROFEN 400 MG PO TABS
400.0000 mg | ORAL_TABLET | Freq: Once | ORAL | Status: AC
  Administered 2019-02-09: 400 mg via ORAL
  Filled 2019-02-09: qty 1

## 2019-02-09 MED ORDER — SODIUM CHLORIDE 0.9 % IV SOLN
INTRAVENOUS | Status: DC
  Administered 2019-02-09 (×2): via INTRAVENOUS

## 2019-02-09 MED ORDER — GUAIFENESIN ER 600 MG PO TB12
600.0000 mg | ORAL_TABLET | Freq: Two times a day (BID) | ORAL | Status: DC
  Administered 2019-02-09 – 2019-02-10 (×3): 600 mg via ORAL
  Filled 2019-02-09 (×3): qty 1

## 2019-02-09 NOTE — Progress Notes (Signed)
PROGRESS NOTE    Kayla Choi  NUU:[id number] DOB: 07/05/1966 DOA: 02/07/2019 PCP: Alycia Rossetti, MD   Brief Narrative:  Per HPI:  Kayla Choi is a 53 y.o. female with medical history significant for COPD, hypertension, and type 2 diabetes mellitus, now presenting to the emergency department for evaluation of cough, congestion, nausea, vomiting, and diarrhea.  Patient reports that she was in her usual state of health until yesterday when she developed a nonproductive cough and some sinus congestion.  This morning, she developed nausea, has had a few episodes of nonbloody vomiting, and has had some loose stool.  She denies any abdominal pain.  She does report chills and a general malaise.  She denies any chest pain or palpitations.  She reports chronic mild lower extremity swelling that is unchanged.  She did not receive the influenza vaccine this season and is unaware of any sick contacts.  No recent travel.  She has been taking over-the-counter cold and flu preparations without relief.   Patient was admitted with influenza A and continues to have significant tachycardia as well as mild fevers.  Assessment & Plan:   Principal Problem:   Influenza A Active Problems:   Essential hypertension   COPD (chronic obstructive pulmonary disease) ([place])   Diabetes mellitus, type II ([place])   1. Influenza A  -Fevers up to 103.1, tachycardia and tachypnea persist despite Tamiflu, ibuprofen and Tylenol,- patient remains quite symptomatic, continue Tamiflu and supportive care with IVF, antipyretics, and antiemetics , oral intake is poor, requiring IV fluids due to nausea vomiting and diarrhea  2. Nausea, vomiting, loose stools  - Likely secondary to #1  - oral intake is poor, requiring IV fluids due to nausea vomiting and diarrhea  Continue IVF hydration, antiemetics, monitor lytes, and check UA    3. Type II DM -improving hyperglycemia - A1c was 6.8% one year ago  - Managed with  metformin only at home, held on admission  - Check CBG's and use a low-intensity SSI with Novolog while in hospital    4. Hypertension  - Hold HCTZ while hydrating, continue lisinopril and Norvasc as tolerated  -Continue to monitor  5. COPD  On and off wheezes, continue bronchodilators including Spiriva and prn albuterol  which has been changed to Xopenex due to tachycardia.    DVT prophylaxis: Lovenox Code Status: Full Family Communication: None at bedside Disposition Plan: Continue treatment for influenza A with supportive measures and discharge once improved.   Consultants:   None  Procedures:   None  Antimicrobials:   Tamiflu 3/2->   Subjective: T-max 103.1, despite Tylenol and ibuprofen repeat temperature hours later 101.1, tachycardia and tachypnea persist  No Vomiting no diarrhea   Objective: Vitals:   02/08/19 2216 02/09/19 0701 02/09/19 0756 02/09/19 1434  BP: (!) 166/93 135/79  135/67  Pulse: (!) 106 (!) 121  (!) 101  Resp: 20 20  16   Temp: 99.3 F (37.4 C) (!) 101.1 F (38.4 C)  99.1 F (37.3 C)  TempSrc: Oral Oral  Oral  SpO2: 97% 96% 93% 100%  Weight:      Height:        Intake/Output Summary (Last 24 hours) at 02/09/2019 1823 Last data filed at 02/09/2019 1308 Gross per 24 hour  Intake 2398.87 ml  Output -  Net 2398.87 ml   Filed Weights   02/07/19 1600 02/07/19 2322 02/07/19 2334  Weight: 82.6 kg 84.6 kg 86.9 kg    Examination:  General exam: Appears calm  and comfortable  Nose- 2L/min Respiratory system: Diminished in bases, scattered wheezes, Cardiovascular system: S1 & S2 heard, RRR. No JVD, murmurs, rubs, gallops or clicks. No pedal edema. Gastrointestinal system: Abdomen is nondistended, soft and nontender. No organomegaly or masses felt. Normal bowel sounds heard. Central nervous system: Alert and oriented. No focal neurological deficits. Extremities: Symmetric 5 x 5 power. Skin: No rashes, lesions or ulcers Psychiatry:  Judgement and insight appear normal. Mood & affect appropriate.     Data Reviewed: I have personally reviewed following labs and imaging studies  CBC: Recent Labs  Lab 02/07/19 1620 02/08/19 0528 02/09/19 0609  WBC 10.5 5.3 6.8  HGB 12.8 10.4* 10.7*  HCT 39.6 33.6* 33.2*  MCV 94.7 96.0 96.5  PLT 326 251 194   Basic Metabolic Panel: Recent Labs  Lab 02/07/19 1620 02/08/19 0528 02/09/19 0609  NA 136 139 135  K 3.6 3.5 3.6  CL 102 109 105  CO2 24 23 24   GLUCOSE 264* 168* 190*  BUN 17 14 9   CREATININE 0.73 0.71 0.67  CALCIUM 9.1 7.8* 7.9*  MG  --  1.6* 2.0   GFR: Estimated Creatinine Clearance: 81.4 mL/min (by C-G formula based on SCr of 0.67 mg/dL). Liver Function Tests: Recent Labs  Lab 02/07/19 1620 02/08/19 0528  AST 35 39  ALT 38 41  ALKPHOS 54 42  BILITOT 1.4* 0.9  PROT 7.5 6.0*  ALBUMIN 4.0 3.1*   Recent Labs  Lab 02/07/19 1620  LIPASE 24   No results for input(s): AMMONIA in the last 168 hours. Coagulation Profile: No results for input(s): INR, PROTIME in the last 168 hours. Cardiac Enzymes: No results for input(s): CKTOTAL, CKMB, CKMBINDEX, TROPONINI in the last 168 hours. BNP (last 3 results) No results for input(s): PROBNP in the last 8760 hours. HbA1C: No results for input(s): HGBA1C in the last 72 hours. CBG: Recent Labs  Lab 02/08/19 0733 02/08/19 2217 02/09/19 0731 02/09/19 1117 02/09/19 1629  GLUCAP 159* 122* 174* 196* 154*   Lipid Profile: No results for input(s): CHOL, HDL, LDLCALC, TRIG, CHOLHDL, LDLDIRECT in the last 72 hours. Thyroid Function Tests: No results for input(s): TSH, T4TOTAL, FREET4, T3FREE, THYROIDAB in the last 72 hours. Anemia Panel: No results for input(s): VITAMINB12, FOLATE, FERRITIN, TIBC, IRON, RETICCTPCT in the last 72 hours. Sepsis Labs: No results for input(s): PROCALCITON, LATICACIDVEN in the last 168 hours.  No results found for this or any previous visit (from the past 240 hour(s)).        Radiology Studies: Dg Chest 2 View  Result Date: 02/07/2019 CLINICAL DATA:  Cough and shortness of breath. EXAM: CHEST - 2 VIEW COMPARISON:  May 22, 2016 FINDINGS: The heart size and mediastinal contours are within normal limits. Both lungs are clear. The visualized skeletal structures are unremarkable. IMPRESSION: No active cardiopulmonary disease. Electronically Signed   By: Dorise Bullion III M.D   On: 02/07/2019 20:07     Scheduled Meds: . amLODipine  5 mg Oral Daily  . enoxaparin (LOVENOX) injection  40 mg Subcutaneous Q24H  . guaiFENesin  600 mg Oral BID  . insulin aspart  0-5 Units Subcutaneous QHS  . insulin aspart  0-9 Units Subcutaneous TID WC  . lisinopril  40 mg Oral Daily  . oseltamivir  75 mg Oral BID  . rosuvastatin  40 mg Oral q1800  . sodium chloride flush  3 mL Intravenous Once  . umeclidinium bromide  1 puff Inhalation Daily   Continuous Infusions: . sodium chloride  150 mL/hr at 02/09/19 1752  . lactated ringers 100 mL/hr at 02/08/19 1206     LOS: 1 day    Angelic Schnelle, DO [facility]  If 7PM-7AM, please contact night-coverage [email or web address] Password [facility] 02/09/2019, 6:23 PM

## 2019-02-09 NOTE — Progress Notes (Signed)
RN paged Dr. Denton Brick regarding patient's temp of 101.1, awaiting response.  P.J. Linus Mako, RN

## 2019-02-10 LAB — GLUCOSE, CAPILLARY
Glucose-Capillary: 105 mg/dL — ABNORMAL HIGH (ref 70–99)
Glucose-Capillary: 205 mg/dL — ABNORMAL HIGH (ref 70–99)

## 2019-02-10 MED ORDER — ACETAMINOPHEN 325 MG PO TABS: 650.0000 mg | ORAL_TABLET | Freq: Four times a day (QID) | ORAL | 1 refills | Status: DC | PRN

## 2019-02-10 MED ORDER — LISINOPRIL-HYDROCHLOROTHIAZIDE 20-12.5 MG PO TABS: 1.0000 | ORAL_TABLET | Freq: Every day | ORAL | 3 refills | Status: DC

## 2019-02-10 MED ORDER — GUAIFENESIN ER 600 MG PO TB12: 600.0000 mg | ORAL_TABLET | Freq: Two times a day (BID) | ORAL | 0 refills | Status: DC

## 2019-02-10 MED ORDER — OSELTAMIVIR PHOSPHATE 75 MG PO CAPS: 75.0000 mg | ORAL_CAPSULE | Freq: Two times a day (BID) | ORAL | 0 refills | Status: DC

## 2019-02-10 NOTE — Discharge Summary (Signed)
Kayla Choi, is a 53 y.o. female  DOB 08-09-1966  MRN [id number].  Admission date:  02/07/2019  Admitting Physician  Vianne Bulls, MD  Discharge Date:  02/10/2019   Primary MD  Alycia Rossetti, MD  Recommendations for primary care physician for things to follow:   1) take medications as prescribed 2) return to work on Monday, February 14, 2019 3) drink plenty fluids  Admission Diagnosis  Influenza A [J10.1] Tachycardia with heart rate 121-140 beats per minute [R00.0]   Discharge Diagnosis  Influenza A [J10.1] Tachycardia with heart rate 121-140 beats per minute [R00.0]    Principal Problem:   Influenza A Active Problems:   Essential hypertension   COPD (chronic obstructive pulmonary disease) ([place])   Diabetes mellitus, type II ([place])      Past Medical History:  Diagnosis Date  . Acid reflux   . ADD (attention deficit disorder)   . Anemia    iron deficinecy  . Anxiety    with social phobia  . Asthma   . Balance problem   . Chest pain    that occurs with anxiety  . COPD (chronic obstructive pulmonary disease) ([place])   . Depression   . Diabetes mellitus   . Glaucoma   . Hyperlipidemia   . Hypertension   . Normal cardiac stress test 12/2015   low risk study  . Panic attack   . Prediabetes   . Recurrent falls   . Social phobia     Past Surgical History:  Procedure Laterality Date  . ABDOMINAL HYSTERECTOMY     fibroids, both ovaries left intact  . ANKLE SURGERY    . CHOLECYSTECTOMY    . COSMETIC SURGERY     elbow  . FRACTURE SURGERY     Recurrent elbow surgery  . LIPOMA EXCISION  04/30/2012   Procedure: EXCISION LIPOMA;  Surgeon: Donato Heinz, MD;  Location: [facility];  Service: General;  Laterality: Right;  Excision soft tissue mass right thigh  . ORIF ANKLE FRACTURE Right 12/15/2017   Procedure: OPEN REDUCTION INTERNAL FIXATION (ORIF) RIGHT ANKLE FRACTURE;  Surgeon: Renette Butters, MD;  Location: [facility];  Service: Orthopedics;  Laterality: Right;  . right elbow     reconstruction       HPI  from the history and physical done on the day of admission:    Chief Complaint: Cough, congestion, N/V/D   HPI: Kayla Choi is a 53 y.o. female with medical history significant for COPD, hypertension, and type 2 diabetes mellitus, now presenting to the emergency department for evaluation of cough, congestion, nausea, vomiting, and diarrhea.  Patient reports that she was in her usual state of health until yesterday when she developed a nonproductive cough and some sinus congestion.  This morning, she developed nausea, has had a few episodes of nonbloody vomiting, and has had some loose stool.  She denies any abdominal pain.  She does report chills and a general malaise.  She denies any chest pain or  palpitations.  She reports chronic mild lower extremity swelling that is unchanged.  She did not receive the influenza vaccine this season and is unaware of any sick contacts.  No recent travel.  She has been taking over-the-counter cold and flu preparations without relief.   ED Course: Upon arrival to the ED, patient is found to be febrile to 39.2 C, saturating mid 90s on room air, tachypneic to 30, tachycardic in the 130s, and with stable blood pressure.  Chest x-ray is negative for acute cardiopulmonary disease.  Chemistry panel is notable for glucose of 264 and bilirubin 1.4.  CBC is unremarkable.  Influenza PCR is positive for influenza A.  Patient was given 2 L normal saline, Advil, Toradol, and Tamiflu in the ED.  Despite completed the IV fluids, she remains tachycardic in the 120s at rest, 140s with slight exertion, is generally uncomfortable though not in any acute respiratory distress, and she will be observed in the hospital for ongoing evaluation and management.  Review of Systems:      Hospital Course:   Brief Narrative:  Per  HPI:  Kayla Choi a 53 y.o.femalewith medical history significant forCOPD, hypertension, and type 2 diabetes mellitus, now presenting to the emergency department for evaluation of cough, congestion, nausea, vomiting, and diarrhea.Patient reports that she was in her usual state of health until yesterday when she developed a nonproductive cough and some sinus congestion. This morning, she developed nausea, has had a few episodes of nonbloody vomiting, and has had some loose stool. She denies any abdominal pain. She does report chills and a general malaise. She denies any chest pain or palpitations. She reports chronic mild lower extremity swelling that is unchanged. She did not receive the influenza vaccine this season and is unaware of any sick contacts. No recent travel. She has been taking over-the-counter cold and flu preparations without relief.  Patient was admitted with influenza A and continues to have significant tachycardia as well as mild fevers.   1.Influenza A -Fevers was up to 103.1, , patient is now afebrile, tachycardia and tachypnea has resolved, treated with Tamiflu, ibuprofen and Tylenol,-she required IV fluids due to nausea vomiting and diarrhea ....    GI symptoms have resolved, patient able to tolerate oral intake at this time  2)Type II DM-improving hyperglycemia -A1c was 6.8% one year ago -Managed with metformin only at home, ok to restart metformin upon discharge   3Hypertension= -Decrease lisinopril HCTZ to 1 tablet only,  continue lisinopril and Norvasc   5.COPD No further wheezing, continue bronchodilators including Spiriva and prn albuterol   DVT prophylaxis: Lovenox Code Status: Full Family Communication: None at bedside Disposition Plan:  Home  Consultants:   None  Procedures:   None  Antimicrobials:   Tamiflu 3/2->  Discharge Condition: stable  Diet and Activity recommendation:  As advised  Discharge  Instructions    Discharge Instructions    Call MD for:  difficulty breathing, headache or visual disturbances   Complete by:  As directed    Call MD for:  extreme fatigue   Complete by:  As directed    Call MD for:  persistant dizziness or light-headedness   Complete by:  As directed    Call MD for:  persistant nausea and vomiting   Complete by:  As directed    Call MD for:  severe uncontrolled pain   Complete by:  As directed    Call MD for:  temperature >100.4   Complete by:  As directed  Diet - low sodium heart healthy   Complete by:  As directed    Diet Carb Modified   Complete by:  As directed    Discharge instructions   Complete by:  As directed    1) take medications as prescribed 2) return to work on Monday, February 14, 2019 3) drink plenty fluids   Increase activity slowly   Complete by:  As directed         Discharge Medications     Allergies as of 02/10/2019      Reactions   Aspirin Shortness Of Breath, Itching   Metformin And Related Anaphylaxis   Other Anaphylaxis   Green peas   Latex    REACTION: Rash and itching   Neosporin [neomycin-bacitracin Zn-polymyx] Other (See Comments)   Reaction: swelling,rash and skin peeling   Oxycodone Itching      Medication List    TAKE these medications   acetaminophen 325 MG tablet Commonly known as:  TYLENOL Take 2 tablets (650 mg total) by mouth every 6 (six) hours as needed for mild pain or headache (or Fever >/= 101).   albuterol (2.5 MG/3ML) 0.083% nebulizer solution Commonly known as:  PROVENTIL Take 3 mLs (2.5 mg total) by nebulization every 4 (four) hours as needed for wheezing or shortness of breath.   albuterol 108 (90 Base) MCG/ACT inhaler Commonly known as:  PROVENTIL HFA;VENTOLIN HFA Inhale 2 puffs into the lungs every 4 (four) hours as needed for wheezing or shortness of breath.   ALKA-SELTZER PLUS COLD 2-7.8-325 MG Tbef Generic drug:  Chlorphen-Phenyleph-ASA Take 1 tablet by mouth daily as  needed (for cold and flu).   amLODipine 5 MG tablet Commonly known as:  NORVASC Take 1 tablet (5 mg total) by mouth daily.   guaiFENesin 600 MG 12 hr tablet Commonly known as:  MUCINEX Take 1 tablet (600 mg total) by mouth 2 (two) times daily.   lisinopril-hydrochlorothiazide 20-12.5 MG tablet Commonly known as:  PRINZIDE,ZESTORETIC Take 1 tablet by mouth daily. What changed:  how much to take   metFORMIN 500 MG tablet Commonly known as:  GLUCOPHAGE Take 1 tablet (500 mg total) by mouth daily with breakfast.   naproxen sodium 220 MG tablet Commonly known as:  ALEVE Take 440 mg by mouth daily as needed (for pain).   oseltamivir 75 MG capsule Commonly known as:  TAMIFLU Take 1 capsule (75 mg total) by mouth 2 (two) times daily.   rosuvastatin 40 MG tablet Commonly known as:  CRESTOR Take 1 tablet (40 mg total) by mouth daily.   THERAFLU EXPRESSMAX 12.5-5-325 MG/15ML Liqd Generic drug:  diphenhydrAMINE-PE-APAP Take 5-10 mLs by mouth daily as needed (for cold and flu symptoms).   Tiotropium Bromide Monohydrate 2.5 MCG/ACT Aers Commonly known as:  SPIRIVA RESPIMAT 2 puff daily What changed:    how much to take  how to take this  when to take this  additional instructions       Major procedures and Radiology Reports - PLEASE review detailed and final reports for all details, in brief -   Dg Chest 2 View  Result Date: 02/07/2019 CLINICAL DATA:  Cough and shortness of breath. EXAM: CHEST - 2 VIEW COMPARISON:  May 22, 2016 FINDINGS: The heart size and mediastinal contours are within normal limits. Both lungs are clear. The visualized skeletal structures are unremarkable. IMPRESSION: No active cardiopulmonary disease. Electronically Signed   By: Dorise Bullion III M.D   On: 02/07/2019 20:07    Today  Subjective    Kayla Choi today has no new complaints, no further vomiting, no further fevers, tolerating oral intake well,   patient has been seen and  examined prior to discharge   Objective   Blood pressure 136/74, pulse (!) 107, temperature 98.7 F (37.1 C), temperature source Oral, resp. rate 18, height 5\' 1"  (1.549 m), weight 86.9 kg, SpO2 93 %.   Intake/Output Summary (Last 24 hours) at 02/10/2019 1140 Last data filed at 02/10/2019 0900 Gross per 24 hour  Intake 720 ml  Output -  Net 720 ml    Exam General exam: Appears calm and comfortable  Respiratory system:  Improved air movement, no wheezing Cardiovascular system: S1 & S2 heard, RRR. No JVD, murmurs, rubs, gallops or clicks. No pedal edema. Gastrointestinal system: Abdomen is nondistended, soft and nontender. No organomegaly or masses felt. Normal bowel sounds heard. Central nervous system: Alert and oriented. No focal neurological deficits. Extremities: Symmetric 5 x 5 power. Skin: No rashes, lesions or ulcers Psychiatry: Judgement and insight appear normal. Mood & affect appropriate.    Data Review   CBC w Diff:  Lab Results  Component Value Date   WBC 6.8 02/09/2019   HGB 10.7 (L) 02/09/2019   HCT 33.2 (L) 02/09/2019   PLT 230 02/09/2019   LYMPHOPCT 36 04/10/2017   BANDSPCT 0 02/08/2009   MONOPCT 7.5 01/01/2018   EOSPCT 1.5 01/01/2018   BASOPCT 0.9 01/01/2018    CMP:  Lab Results  Component Value Date   NA 135 02/09/2019   K 3.6 02/09/2019   CL 105 02/09/2019   CO2 24 02/09/2019   BUN 9 02/09/2019   CREATININE 0.67 02/09/2019   CREATININE 0.77 09/02/2017   PROT 6.0 (L) 02/08/2019   ALBUMIN 3.1 (L) 02/08/2019   BILITOT 0.9 02/08/2019   ALKPHOS 42 02/08/2019   AST 39 02/08/2019   ALT 41 02/08/2019  .   Total Discharge time is about 33 minutes  Roxan Hockey M.D on 02/10/2019 at 11:40 AM  Go to [email or web address] -  for contact info  [facility] - Office  [phone]

## 2019-02-10 NOTE — Progress Notes (Signed)
Patient discharged home with instructions given on medications,and follow up visits,patient verbalized understanding. Prescriptions sent to Pharmacy of choice documented on AVS. Accompanied by staff to an awaiting vehicle.

## 2019-02-16 ENCOUNTER — Other Ambulatory Visit: Payer: Self-pay

## 2019-02-16 ENCOUNTER — Ambulatory Visit: Payer: Medicaid Other | Admitting: Family Medicine

## 2019-02-16 ENCOUNTER — Encounter: Payer: Self-pay | Admitting: Family Medicine

## 2019-02-16 VITALS — BP 134/82 | HR 86 | Temp 98.4°F | Resp 14 | Ht 60.0 in | Wt 181.0 lb

## 2019-02-16 DIAGNOSIS — E119 Type 2 diabetes mellitus without complications: Secondary | ICD-10-CM

## 2019-02-16 DIAGNOSIS — I10 Essential (primary) hypertension: Secondary | ICD-10-CM

## 2019-02-16 DIAGNOSIS — J101 Influenza due to other identified influenza virus with other respiratory manifestations: Secondary | ICD-10-CM | POA: Diagnosis not present

## 2019-02-16 DIAGNOSIS — F411 Generalized anxiety disorder: Secondary | ICD-10-CM

## 2019-02-16 DIAGNOSIS — J410 Simple chronic bronchitis: Secondary | ICD-10-CM | POA: Diagnosis not present

## 2019-02-16 MED ORDER — BENZONATATE 200 MG PO CAPS: 200.0000 mg | ORAL_CAPSULE | Freq: Three times a day (TID) | ORAL | 0 refills | Status: DC | PRN

## 2019-02-16 MED ORDER — TIOTROPIUM BROMIDE MONOHYDRATE 2.5 MCG/ACT IN AERS: INHALATION_SPRAY | RESPIRATORY_TRACT | 3 refills | Status: DC

## 2019-02-16 MED ORDER — LISINOPRIL-HYDROCHLOROTHIAZIDE 20-12.5 MG PO TABS: 1.0000 | ORAL_TABLET | Freq: Every day | ORAL | 3 refills | Status: DC

## 2019-02-16 NOTE — Assessment & Plan Note (Signed)
Goal is A1c less than 7%.  I am going to recheck her A1c before starting the metformin we will also check her lipid panel she is fasting she was on Crestor in the past.  States that she did not follow-up because both of her children had kids less than a month apart and she has been babysitting.

## 2019-02-16 NOTE — Progress Notes (Signed)
Subjective:    Patient ID: Kayla Choi, female    DOB: July 22, 1966, 53 y.o.   MRN: [id number]  Patient presents for Hospital F/U (Flu, tachycardia)  Pt here for hospital follow up, she had influenza A and significant tachycardia up to 140 she admits she was very anxious and scared causing her HR to jump up.  She completed a course of Tamiflu, she did not have superimposed PNA.  Her vomiting and diarrhea resolved only had 1 episode   Diabetes mellitus- A1C last done in Jan 2019 was  6.8% at that time, no longer on metformin     HTN- lisinopril HCTZ was decreased to 1 tablet, she was also on norvasc but has not been on for over a year   Only meds she has at home albuterol for neb and lisonopril HCTZ  Last visit with me was in Jan  2019 No longer on crestor or spiriva   Review Of Systems:  GEN- denies fatigue, fever, weight loss,weakness, recent illness HEENT- denies eye drainage, change in vision, nasal discharge, CVS- denies chest pain, palpitations RESP- denies SOB, cough, wheeze ABD- denies N/V, change in stools, abd pain GU- denies dysuria, hematuria, dribbling, incontinence MSK- denies joint pain, muscle aches, injury Neuro- denies headache, dizziness, syncope, seizure activity       Objective:    BP 134/82   Pulse 86   Temp 98.4 F (36.9 C) (Oral)   Resp 14   Ht 5' (1.524 m)   Wt 181 lb (82.1 kg)   SpO2 96%   BMI 35.35 kg/m  GEN- NAD, alert and oriented x3 HEENT- PERRL, EOMI, non injected sclera, pink conjunctiva, MMM, oropharynx clear Neck- Supple, no thyromegaly CVS- RRR, no murmur RESP-CTAB, cough , no wheeze  ABD-NABS,soft,NT,ND Psych- normal affect and mood  EXT- No edema Pulses- Radial, DP- 2+        Assessment & Plan:      Problem List Items Addressed This Visit      Unprioritized   Anxiety state    No current medications.  She has not seen psychiatry in quite some time      COPD (chronic obstructive pulmonary disease) ([place])    She  does not have her Spiriva given her samples of this.  She does think that she will be able to get her medications this month.  He does have albuterol nebulizer to use for exacerbations.      Relevant Medications   benzonatate (TESSALON) 200 MG capsule   Tiotropium Bromide Monohydrate (SPIRIVA RESPIMAT) 2.5 MCG/ACT AERS   Diabetes mellitus, type II ([facility])    Goal is A1c less than 7%.  I am going to recheck her A1c before starting the metformin we will also check her lipid panel she is fasting she was on Crestor in the past.  States that she did not follow-up because both of her children had kids less than a month apart and she has been babysitting.      Relevant Medications   lisinopril-hydrochlorothiazide (PRINZIDE,ZESTORETIC) 20-12.5 MG tablet   Other Relevant Orders   Hemoglobin A1c   Lipid panel   Essential hypertension    Blood pressure looks okay we will continue the lisinopril HCTZ for now and monitor.      Relevant Medications   lisinopril-hydrochlorothiazide (PRINZIDE,ZESTORETIC) 20-12.5 MG tablet   Other Relevant Orders   CBC with Differential/Platelet   Comprehensive metabolic panel   Influenza A - Primary    She has completed Tamiflu.  Still has  residual cough but this is better get especially in setting of her COPD.  Prescribed her Tessalon Perles for cough.         Note: This dictation was prepared with Dragon dictation along with smaller phrase technology. Any transcriptional errors that result from this process are unintentional.

## 2019-02-16 NOTE — Patient Instructions (Signed)
Tessalon for cough Use spiriva We will call with lab results and how much metformin, cholesterol medicine to take  F/U 4 months for physical

## 2019-02-16 NOTE — Assessment & Plan Note (Addendum)
Blood pressure looks okay we will continue the lisinopril HCTZ for now and monitor.

## 2019-02-16 NOTE — Assessment & Plan Note (Signed)
No current medications.  She has not seen psychiatry in quite some time

## 2019-02-16 NOTE — Assessment & Plan Note (Addendum)
She does not have her Spiriva given her samples of this.  She does think that she will be able to get her medications this month.  He does have albuterol nebulizer to use for exacerbations.

## 2019-02-16 NOTE — Assessment & Plan Note (Signed)
She has completed Tamiflu.  Still has residual cough but this is better get especially in setting of her COPD.  Prescribed her Tessalon Perles for cough.

## 2019-02-18 ENCOUNTER — Telehealth: Payer: Self-pay | Admitting: *Deleted

## 2019-02-18 ENCOUNTER — Other Ambulatory Visit (HOSPITAL_COMMUNITY)
Admission: RE | Admit: 2019-02-18 | Discharge: 2019-02-18 | Disposition: A | Discharge status: Home / Self Care | Payer: Medicaid Other | Source: Ambulatory Visit | Attending: Family Medicine | Admitting: Family Medicine

## 2019-02-18 DIAGNOSIS — E119 Type 2 diabetes mellitus without complications: Secondary | ICD-10-CM | POA: Diagnosis not present

## 2019-02-18 DIAGNOSIS — I10 Essential (primary) hypertension: Secondary | ICD-10-CM | POA: Insufficient documentation

## 2019-02-18 LAB — CBC WITH DIFFERENTIAL/PLATELET
Abs Immature Granulocytes: 0.03 10*3/uL (ref 0.00–0.07)
Basophils Absolute: 0 10*3/uL (ref 0.0–0.1)
Basophils Relative: 0 %
Eosinophils Absolute: 0.1 10*3/uL (ref 0.0–0.5)
Eosinophils Relative: 2 %
HCT: 39.4 % (ref 36.0–46.0)
Hemoglobin: 12.3 g/dL (ref 12.0–15.0)
Immature Granulocytes: 1 %
LYMPHS PCT: 40 %
Lymphs Abs: 2.2 10*3/uL (ref 0.7–4.0)
MCH: 30.1 pg (ref 26.0–34.0)
MCHC: 31.2 g/dL (ref 30.0–36.0)
MCV: 96.3 fL (ref 80.0–100.0)
Monocytes Absolute: 0.5 10*3/uL (ref 0.1–1.0)
Monocytes Relative: 10 %
NRBC: 0 % (ref 0.0–0.2)
Neutro Abs: 2.6 10*3/uL (ref 1.7–7.7)
Neutrophils Relative %: 47 %
Platelets: 563 10*3/uL — ABNORMAL HIGH (ref 150–400)
RBC: 4.09 MIL/uL (ref 3.87–5.11)
RDW: 12.8 % (ref 11.5–15.5)
WBC: 5.4 10*3/uL (ref 4.0–10.5)

## 2019-02-18 LAB — COMPREHENSIVE METABOLIC PANEL
ALT: 61 U/L — ABNORMAL HIGH (ref 0–44)
AST: 36 U/L (ref 15–41)
Albumin: 4 g/dL (ref 3.5–5.0)
Alkaline Phosphatase: 56 U/L (ref 38–126)
Anion gap: 10 (ref 5–15)
BUN: 13 mg/dL (ref 6–20)
CO2: 26 mmol/L (ref 22–32)
Calcium: 9 mg/dL (ref 8.9–10.3)
Chloride: 103 mmol/L (ref 98–111)
Creatinine, Ser: 0.97 mg/dL (ref 0.44–1.00)
GFR calc Af Amer: >60 mL/min (ref >60)
GFR calc non Af Amer: >60 mL/min (ref >60)
Glucose, Bld: 178 mg/dL — ABNORMAL HIGH (ref 70–99)
Potassium: 4.4 mmol/L (ref 3.5–5.1)
Sodium: 139 mmol/L (ref 135–145)
TOTAL PROTEIN: 7.6 g/dL (ref 6.5–8.1)
Total Bilirubin: 1.4 mg/dL — ABNORMAL HIGH (ref 0.3–1.2)

## 2019-02-18 LAB — LIPID PANEL
CHOLESTEROL: 186 mg/dL (ref 0–200)
HDL: 46 mg/dL (ref >40)
LDL Cholesterol: 119 mg/dL — ABNORMAL HIGH (ref 0–99)
Total CHOL/HDL Ratio: 4 RATIO
Triglycerides: 107 mg/dL (ref <150)
VLDL: 21 mg/dL (ref 0–40)

## 2019-02-18 LAB — HEMOGLOBIN A1C
Hgb A1c MFr Bld: 8.5 % — ABNORMAL HIGH (ref 4.8–5.6)
Mean Plasma Glucose: 197.25 mg/dL

## 2019-02-18 MED ORDER — TIOTROPIUM BROMIDE MONOHYDRATE 2.5 MCG/ACT IN AERS: INHALATION_SPRAY | RESPIRATORY_TRACT | 3 refills | Status: DC

## 2019-02-18 NOTE — Telephone Encounter (Signed)
Received request from pharmacy for PA on Spiriva Respimat.   PA submitted via [employer].   Confirmation # [id number].

## 2019-02-18 NOTE — Telephone Encounter (Signed)
Received PA determination.   PA [id number] approved through 02/18/2020.  Pharmacy made aware.

## 2019-07-08 ENCOUNTER — Ambulatory Visit (INDEPENDENT_AMBULATORY_CARE_PROVIDER_SITE_OTHER): Payer: Medicaid Other | Admitting: Family Medicine

## 2019-07-08 ENCOUNTER — Other Ambulatory Visit: Payer: Self-pay

## 2019-07-08 ENCOUNTER — Encounter: Payer: Self-pay | Admitting: Family Medicine

## 2019-07-08 VITALS — BP 138/72 | HR 96 | Temp 98.8°F | Resp 14 | Ht 60.0 in | Wt 184.0 lb

## 2019-07-08 DIAGNOSIS — F411 Generalized anxiety disorder: Secondary | ICD-10-CM | POA: Diagnosis not present

## 2019-07-08 DIAGNOSIS — L910 Hypertrophic scar: Secondary | ICD-10-CM | POA: Diagnosis not present

## 2019-07-08 DIAGNOSIS — E119 Type 2 diabetes mellitus without complications: Secondary | ICD-10-CM

## 2019-07-08 DIAGNOSIS — J410 Simple chronic bronchitis: Secondary | ICD-10-CM

## 2019-07-08 DIAGNOSIS — Z1239 Encounter for other screening for malignant neoplasm of breast: Secondary | ICD-10-CM | POA: Diagnosis not present

## 2019-07-08 DIAGNOSIS — E669 Obesity, unspecified: Secondary | ICD-10-CM

## 2019-07-08 DIAGNOSIS — Z0001 Encounter for general adult medical examination with abnormal findings: Secondary | ICD-10-CM | POA: Diagnosis not present

## 2019-07-08 DIAGNOSIS — I10 Essential (primary) hypertension: Secondary | ICD-10-CM

## 2019-07-08 DIAGNOSIS — Z1211 Encounter for screening for malignant neoplasm of colon: Secondary | ICD-10-CM | POA: Diagnosis not present

## 2019-07-08 DIAGNOSIS — Z113 Encounter for screening for infections with a predominantly sexual mode of transmission: Secondary | ICD-10-CM

## 2019-07-08 DIAGNOSIS — E782 Mixed hyperlipidemia: Secondary | ICD-10-CM | POA: Diagnosis not present

## 2019-07-08 DIAGNOSIS — Z0000 Encounter for general adult medical examination without abnormal findings: Secondary | ICD-10-CM

## 2019-07-08 LAB — WET PREP FOR TRICH, YEAST, CLUE

## 2019-07-08 MED ORDER — ROSUVASTATIN CALCIUM 5 MG PO TABS: 5.0000 mg | ORAL_TABLET | Freq: Every day | ORAL | 3 refills | Status: DC

## 2019-07-08 NOTE — Assessment & Plan Note (Signed)
Fair control no change to medication.

## 2019-07-08 NOTE — Progress Notes (Signed)
Subjective:    Patient ID: Kayla Choi, female    DOB: 1965-12-24, 53 y.o.   MRN: [id number]  Patient presents for Annual Exam (is fasting) and Edema (R Leg- swelling around ankle and pain )  Pt here for CPE We reviewed her medications however she states that she is taking cholesterol medicine and metformin but initially states that she was not.  We need to call the pharmacy to verify her meds.  States that she is using her inhaler.  She is not following with psychiatry anymore feels like her anxiety is a little better since she is walking her to young grandchildren both are about a year old.  She is overdue for mammogram, colonoscopy.  History of hysterectomy no Pap smear.  She does have one sexual partner who she has had the past 2 years but would still like STD screening  She also noticed some swelling around her right ankle at times.  She has keloids which are enlarging the one beneath her sternum rubs with her bra she would like to go back to dermatology for this.  Shingles not covered TDAP UTD  Review Of Systems:  GEN- denies fatigue, fever, weight loss,weakness, recent illness HEENT- denies eye drainage, change in vision, nasal discharge, CVS- denies chest pain, palpitations RESP- denies SOB, cough, wheeze ABD- denies N/V, change in stools, abd pain GU- denies dysuria, hematuria, dribbling, incontinence MSK- denies joint pain, muscle aches, injury Neuro- denies headache, dizziness, syncope, seizure activity       Objective:    BP 138/72   Pulse 96   Temp 98.8 F (37.1 C) (Oral)   Resp 14   Ht 5' (1.524 m)   Wt 184 lb (83.5 kg)   SpO2 100%   BMI 35.94 kg/m  GEN- NAD, alert and oriented x3 HEENT- PERRL, EOMI, non injected sclera, pink conjunctiva, MMM, oropharynx clear Neck- Supple, no thyromegaly CVS- RRR, no murmur RESP-CTAB Breast- normal symmetry, no nipple inversion,no nipple drainage, no nodules or lumps felt Nodes- no axillary nodes  palpated ABD-NABS,soft,NT,ND Psych- normal affect and mood  Skin- large keloid below sterum and lower abdmen  GU- normal external genitalia, vaginal mucosa pink and moist, s/p hysterectomy  no ovarian masses,  EXT-trace right lateral malleolus edema, nontender full range of motion of ankle Pulses- Radial, DP- 2+   PHQ9 score 5,  Fall positive - history of falls / audit c negative      Assessment & Plan:    No significant swelling of the ankle or foot full range of motion at this time Problem List Items Addressed This Visit      Unprioritized   Anxiety state    She states that she feels like her anxiety and depression are under control.  States she does feel like this is become more overwhelming for her I recommend that she go back to her provider at [facility]      COPD (chronic obstructive pulmonary disease) ([place])    Continue Spiriva and albuterol as needed.      Diabetes mellitus, type II ([place])    Goal is A1c less than percent.  We will check with pharmacy to see exactly what she is taking.      Relevant Medications   rosuvastatin (CRESTOR) 5 MG tablet   Other Relevant Orders   Hemoglobin A1c   Lipid panel   Ambulatory referral to Ophthalmology   HM DIABETES FOOT EXAM (Completed)   Essential hypertension    Fair control  no change to medication.      Relevant Medications   rosuvastatin (CRESTOR) 5 MG tablet   Other Relevant Orders   CBC with Differential/Platelet   Comprehensive metabolic panel   Hyperlipidemia    He states that she is taking Crestor.  Will look and see what her LDL looks like as well as her liver function tests.      Relevant Medications   rosuvastatin (CRESTOR) 5 MG tablet   Other Relevant Orders   Lipid panel   Keloid scar of skin    Referral back to dermatology for treatment      Obesity (BMI 30-39.9)    Other Visit Diagnoses    Routine general medical examination at a health care facility    -  Primary   CPE done,  schedule mammogram, referral for colonoscopy   Colon cancer screening       Relevant Orders   Ambulatory referral to Gastroenterology   Breast cancer screening       Relevant Orders   MM 3D [place] for STD (sexually transmitted disease)       Relevant Orders   HIV Antibody (routine testing w rflx)   RPR   C. trachomatis/N. gonorrhoeae RNA   WET PREP FOR [place], YEAST, CLUE (Completed)      Note: This dictation was prepared with Dragon dictation along with smaller Company secretary. Any transcriptional errors that result from this process are unintentional.

## 2019-07-08 NOTE — Assessment & Plan Note (Signed)
She states that she feels like her anxiety and depression are under control.  States she does feel like this is become more overwhelming for her I recommend that she go back to her provider at [facility]

## 2019-07-08 NOTE — Assessment & Plan Note (Signed)
Goal is A1c less than percent.  We will check with pharmacy to see exactly what she is taking.

## 2019-07-08 NOTE — Assessment & Plan Note (Signed)
Referral back to dermatology for treatment

## 2019-07-08 NOTE — Assessment & Plan Note (Signed)
He states that she is taking Crestor.  Will look and see what her LDL looks like as well as her liver function tests.

## 2019-07-08 NOTE — Patient Instructions (Addendum)
I recommend eye visit once a year I recommend dental visit every 6 months Goal is to  Exercise 30 minutes 5 days a week We will call with lab results  Schedule your mammogram Referral for colonoscopy and eye visit  Referral to dermatology   F/U 4 months

## 2019-07-08 NOTE — Assessment & Plan Note (Signed)
Continue Spiriva and albuterol as needed.

## 2019-07-09 LAB — C. TRACHOMATIS/N. GONORRHOEAE RNA
C. trachomatis RNA, TMA: NOT DETECTED
N. gonorrhoeae RNA, TMA: NOT DETECTED

## 2019-07-11 ENCOUNTER — Other Ambulatory Visit: Payer: Self-pay

## 2019-07-11 DIAGNOSIS — Z20822 Contact with and (suspected) exposure to covid-19: Secondary | ICD-10-CM

## 2019-07-11 DIAGNOSIS — R6889 Other general symptoms and signs: Secondary | ICD-10-CM | POA: Diagnosis not present

## 2019-07-11 LAB — COMPREHENSIVE METABOLIC PANEL
AG Ratio: 1.4 (calc) (ref 1.0–2.5)
ALT: 21 U/L (ref 6–29)
AST: 16 U/L (ref 10–35)
Albumin: 4 g/dL (ref 3.6–5.1)
Alkaline phosphatase (APISO): 52 U/L (ref 37–153)
BUN: 15 mg/dL (ref 7–25)
CO2: 21 mmol/L (ref 20–32)
Calcium: 9.3 mg/dL (ref 8.6–10.4)
Chloride: 102 mmol/L (ref 98–110)
Creat: 0.88 mg/dL (ref 0.50–1.05)
Globulin: 2.9 g/dL (calc) (ref 1.9–3.7)
Glucose, Bld: 280 mg/dL — ABNORMAL HIGH (ref 65–99)
Potassium: 4.1 mmol/L (ref 3.5–5.3)
Sodium: 137 mmol/L (ref 135–146)
Total Bilirubin: 1 mg/dL (ref 0.2–1.2)
Total Protein: 6.9 g/dL (ref 6.1–8.1)

## 2019-07-11 LAB — CBC WITH DIFFERENTIAL/PLATELET
Absolute Monocytes: 298 cells/uL (ref 200–950)
Basophils Absolute: 48 cells/uL (ref 0–200)
Basophils Relative: 1 %
Eosinophils Absolute: 130 cells/uL (ref 15–500)
Eosinophils Relative: 2.7 %
HCT: 41.7 % (ref 35.0–45.0)
Hemoglobin: 13.6 g/dL (ref 11.7–15.5)
Lymphs Abs: 1738 cells/uL (ref 850–3900)
MCH: 31.3 pg (ref 27.0–33.0)
MCHC: 32.6 g/dL (ref 32.0–36.0)
MCV: 95.9 fL (ref 80.0–100.0)
MPV: 12.1 fL (ref 7.5–12.5)
Monocytes Relative: 6.2 %
Neutro Abs: 2587 cells/uL (ref 1500–7800)
Neutrophils Relative %: 53.9 %
Platelets: 345 10*3/uL (ref 140–400)
RBC: 4.35 10*6/uL (ref 3.80–5.10)
RDW: 12 % (ref 11.0–15.0)
Total Lymphocyte: 36.2 %
WBC: 4.8 10*3/uL (ref 3.8–10.8)

## 2019-07-11 LAB — HIV ANTIBODY (ROUTINE TESTING W REFLEX): HIV 1&2 Ab, 4th Generation: NONREACTIVE

## 2019-07-11 LAB — HEMOGLOBIN A1C
Hgb A1c MFr Bld: 10.5 % of total Hgb — ABNORMAL HIGH (ref <5.7)
Mean Plasma Glucose: 255 (calc)
eAG (mmol/L): 14.1 (calc)

## 2019-07-11 LAB — LIPID PANEL
Cholesterol: 223 mg/dL — ABNORMAL HIGH (ref <200)
HDL: 60 mg/dL (ref >=50)
LDL Cholesterol (Calc): 141 mg/dL (calc) — ABNORMAL HIGH
Non-HDL Cholesterol (Calc): 163 mg/dL (calc) — ABNORMAL HIGH (ref <130)
Total CHOL/HDL Ratio: 3.7 (calc) (ref <5.0)
Triglycerides: 102 mg/dL (ref <150)

## 2019-07-11 LAB — RPR: RPR Ser Ql: NONREACTIVE

## 2019-07-12 LAB — NOVEL CORONAVIRUS, NAA: SARS-CoV-2, NAA: NOT DETECTED

## 2019-07-14 ENCOUNTER — Telehealth: Payer: Self-pay

## 2019-07-14 MED ORDER — GLIPIZIDE 5 MG PO TABS: 5.0000 mg | ORAL_TABLET | Freq: Two times a day (BID) | ORAL | 2 refills | Status: DC

## 2019-07-14 NOTE — Telephone Encounter (Signed)
Error

## 2019-07-18 ENCOUNTER — Encounter: Payer: Self-pay | Admitting: Internal Medicine

## 2019-07-29 ENCOUNTER — Ambulatory Visit (HOSPITAL_COMMUNITY)
Admission: RE | Admit: 2019-07-29 | Discharge: 2019-07-29 | Disposition: A | Discharge status: Home / Self Care | Payer: Medicaid Other | Source: Ambulatory Visit | Attending: Family Medicine | Admitting: Family Medicine

## 2019-07-29 ENCOUNTER — Other Ambulatory Visit: Payer: Self-pay

## 2019-07-29 ENCOUNTER — Encounter (HOSPITAL_COMMUNITY): Payer: Self-pay

## 2019-07-29 DIAGNOSIS — Z1231 Encounter for screening mammogram for malignant neoplasm of breast: Secondary | ICD-10-CM | POA: Insufficient documentation

## 2019-07-29 DIAGNOSIS — Z1239 Encounter for other screening for malignant neoplasm of breast: Secondary | ICD-10-CM

## 2019-08-12 ENCOUNTER — Ambulatory Visit: Payer: Self-pay | Admitting: Family Medicine

## 2019-08-24 ENCOUNTER — Other Ambulatory Visit: Payer: Self-pay

## 2019-08-24 ENCOUNTER — Ambulatory Visit (INDEPENDENT_AMBULATORY_CARE_PROVIDER_SITE_OTHER): Payer: Self-pay | Admitting: *Deleted

## 2019-08-24 DIAGNOSIS — Z1211 Encounter for screening for malignant neoplasm of colon: Secondary | ICD-10-CM

## 2019-08-24 MED ORDER — NA SULFATE-K SULFATE-MG SULF 17.5-3.13-1.6 GM/177ML PO SOLN: 1.0000 | Freq: Once | ORAL | 0 refills | Status: AC

## 2019-08-24 NOTE — Progress Notes (Addendum)
Gastroenterology Pre-Procedure Review  Request Date: 08/24/2019 Requesting Physician: Dr. Vic Blackbird, no previous TCS  PATIENT REVIEW QUESTIONS: The patient responded to the following health history questions as indicated:    1. Diabetes Melitis: Yes 2. Joint replacements in the past 12 months: No 3. Major health problems in the past 3 months: No 4. Has an artificial valve or MVP: No 5. Has a defibrillator: No 6. Has been advised in past to take antibiotics in advance of a procedure like teeth cleaning: No 7. Family history of colon cancer: No  8. Alcohol Use: No 9. History of sleep apnea: No 10. History of coronary artery or other vascular stents placed within the last 12 months: No 11. History of any prior anesthesia complications: No    MEDICATIONS & ALLERGIES:    Patient reports the following regarding taking any blood thinners:   Plavix? No Aspirin? No Coumadin? No Brilinta? No Xarelto? No Eliquis? No Pradaxa? No Savaysa? No Effient? No  Patient confirms/reports the following medications:  Current Outpatient Medications  Medication Sig Dispense Refill  . acetaminophen (TYLENOL) 325 MG tablet Take 650 mg by mouth as needed.    Marland Kitchen albuterol (PROVENTIL HFA;VENTOLIN HFA) 108 (90 Base) MCG/ACT inhaler Inhale 2 puffs into the lungs every 4 (four) hours as needed for wheezing or shortness of breath. 1 Inhaler 2  . albuterol (PROVENTIL) (2.5 MG/3ML) 0.083% nebulizer solution Take 3 mLs (2.5 mg total) by nebulization every 4 (four) hours as needed for wheezing or shortness of breath. 75 mL 2  . glipiZIDE (GLUCOTROL) 5 MG tablet Take 1 tablet (5 mg total) by mouth 2 (two) times daily. 30 tablet 2  . lisinopril-hydrochlorothiazide (PRINZIDE,ZESTORETIC) 20-12.5 MG tablet Take 1 tablet by mouth daily. 30 tablet 3  . rosuvastatin (CRESTOR) 5 MG tablet Take 1 tablet (5 mg total) by mouth daily. 90 tablet 3  . Na Sulfate-K Sulfate-Mg Sulf 17.5-3.13-1.6 GM/177ML SOLN Take 1 kit by mouth  once for 1 dose. 354 mL 0   No current facility-administered medications for this visit.     Patient confirms/reports the following allergies:  Allergies  Allergen Reactions  . Aspirin Shortness Of Breath and Itching  . Metformin And Related Anaphylaxis  . Other Anaphylaxis    Green peas  . Latex     REACTION: Rash and itching  . Neosporin [Neomycin-Bacitracin Zn-Polymyx] Other (See Comments)    Reaction: swelling,rash and skin peeling  . Oxycodone Itching    No orders of the defined types were placed in this encounter.   AUTHORIZATION INFORMATION Primary Insurance: Medicaid,  ID#: [id number] Pre-Cert / Josem Kaufmann required: No, not required  SCHEDULE INFORMATION: Procedure has been scheduled as follows:  Date: 11/02/2019, Time: 1:00 Location: [facility] with Dr. Gala Romney  This Gastroenterology Pre-Precedure Review Form is being routed to the following provider(s): Neil Crouch, PA-C

## 2019-08-24 NOTE — Addendum Note (Signed)
Addended by: Mahala Menghini on: 08/24/2019 03:57 PM   Modules accepted: Orders

## 2019-08-24 NOTE — Progress Notes (Signed)
OK to schedule.  Day of prep: glipizide 1/2 tab BID AM of TCS: hold glipizide

## 2019-08-24 NOTE — Patient Instructions (Addendum)
Kayla Choi  05-06-1966 MRN: [id number]     Procedure Date: 10/18/2019 Time to register: 11:45 am Place to register: [facility] Procedure Time: 12:45 pm Scheduled provider: Dr. Gala Romney    PREPARATION FOR COLONOSCOPY WITH SUPREP BOWEL PREP KIT  Note: Suprep Bowel Prep Kit is a split-dose (2day) regimen. Consumption of BOTH 6-ounce bottles is required for a complete prep.  Please notify us immediately if you are diabetic, take iron supplements, or if you are on Coumadin or any other blood thinners.  Please hold the following medications: See letter                                                                                                                                                  2 DAYS BEFORE PROCEDURE:  DATE: 10/16/2019  DAY: Sunday Begin clear liquid diet AFTER your lunch meal. NO SOLID FOODS after this point.  1 DAY BEFORE PROCEDURE:  DATE: 10/17/2019  DAY: Monday Continue clear liquids the entire day - NO SOLID FOOD.   Diabetic medications adjustments for today: See letter  At 6:00pm: Complete steps 1 through 4 below, using ONE (1) 6-ounce bottle, before going to bed. Step 1:  Pour ONE (1) 6-ounce bottle of SUPREP liquid into the mixing container.  Step 2:  Add cool drinking water to the 16 ounce line on the container and mix.  Note: Dilute the solution concentrate as directed prior to use. Step 3:  DRINK ALL the liquid in the container. Step 4:  You MUST drink an additional two (2) or more 16 ounce containers of water over the next one (1) hour.   Continue clear liquids.  DAY OF PROCEDURE:   DATE: 10/18/2019  DAY: Tuesday If you take medications for your heart, blood pressure, or breathing, you may take these medications.  Diabetic medications adjustments for today: See letter  5 hours before your procedure at :  7:45 am Step 1:  Pour ONE (1) 6-ounce bottle of SUPREP liquid into the mixing container.  Step 2:  Add cool drinking water to the 16  ounce line on the container and mix.  Note: Dilute the solution concentrate as directed prior to use. Step 3:  DRINK ALL the liquid in the container. Step 4:  You MUST drink an additional two (2) or more 16 ounce containers of water over the next one (1) hour. You MUST complete the final glass of water at least 3 hours before your colonoscopy. Nothing by mouth past 9:45 am  You may take your morning medications with sip of water unless we have instructed otherwise.    Please see below for Dietary Information.  CLEAR LIQUIDS INCLUDE:  Water Jello (NOT red in color)   Ice Popsicles (NOT red in color)   Tea (sugar ok, no milk/cream) Powdered fruit flavored drinks  Coffee (sugar ok,  no milk/cream) Gatorade/ Lemonade/ Kool-Aid  (NOT red in color)   Juice: apple, white grape, white cranberry Soft drinks  Clear bullion, consomme, broth (fat free beef/chicken/vegetable)  Carbonated beverages (any kind)  Strained chicken noodle soup Hard Candy   Remember: Clear liquids are liquids that will allow you to see your fingers on the other side of a clear glass. Be sure liquids are NOT red in color, and not cloudy, but CLEAR.  DO NOT EAT OR DRINK ANY OF THE FOLLOWING:  Dairy products of any kind   Cranberry juice Tomato juice / V8 juice   Grapefruit juice Orange juice     Red grape juice  Do not eat any solid foods, including such foods as: cereal, oatmeal, yogurt, fruits, vegetables, creamed soups, eggs, bread, crackers, pureed foods in a blender, etc.   HELPFUL HINTS FOR DRINKING PREP SOLUTION:   Make sure prep is extremely cold. Mix and refrigerate the the morning of the prep. You may also put in the freezer.   You may try mixing some Crystal Light or Country Time Lemonade if you prefer. Mix in small amounts; add more if necessary.  Try drinking through a straw  Rinse mouth with water or a mouthwash between glasses, to remove after-taste.  Try sipping on a cold beverage /ice/ popsicles  between glasses of prep.  Place a piece of sugar-free hard candy in mouth between glasses.  If you become nauseated, try consuming smaller amounts, or stretch out the time between glasses. Stop for 30-60 minutes, then slowly start back drinking.     OTHER INSTRUCTIONS  You will need a responsible adult at least 53 years of age to accompany you and drive you home. This person must remain in the waiting room during your procedure. The hospital will cancel your procedure if you do not have a responsible adult with you.   1. Wear loose fitting clothing that is easily removed. 2. Leave jewelry and other valuables at home.  3. Remove all body piercing jewelry and leave at home. 4. Total time from sign-in until discharge is approximately 2-3 hours. 5. You should go home directly after your procedure and rest. You can resume normal activities the day after your procedure. 6. The day of your procedure you should not:  Drive  Make legal decisions  Operate machinery  Drink alcohol  Return to work   You may call the office (Dept: [phone]) before 5:00pm, or page the doctor on call [phone]) after 5:00pm, for further instructions, if necessary.   Insurance Information YOU WILL NEED TO CHECK WITH YOUR INSURANCE COMPANY FOR THE BENEFITS OF COVERAGE YOU HAVE FOR THIS PROCEDURE.  UNFORTUNATELY, NOT ALL INSURANCE COMPANIES HAVE BENEFITS TO COVER ALL OR PART OF THESE TYPES OF PROCEDURES.  IT IS YOUR RESPONSIBILITY TO CHECK YOUR BENEFITS, HOWEVER, WE WILL BE GLAD TO ASSIST YOU WITH ANY CODES YOUR INSURANCE COMPANY MAY NEED.    PLEASE NOTE THAT MOST INSURANCE COMPANIES WILL NOT COVER A SCREENING COLONOSCOPY FOR PEOPLE UNDER THE AGE OF 50  IF YOU HAVE BCBS INSURANCE, YOU MAY HAVE BENEFITS FOR A SCREENING COLONOSCOPY BUT IF POLYPS ARE FOUND THE DIAGNOSIS WILL CHANGE AND THEN YOU MAY HAVE A DEDUCTIBLE THAT WILL NEED TO BE MET. SO PLEASE MAKE SURE YOU CHECK YOUR BENEFITS FOR A SCREENING  COLONOSCOPY AS WELL AS A DIAGNOSTIC COLONOSCOPY.

## 2019-08-25 ENCOUNTER — Encounter: Payer: Self-pay | Admitting: *Deleted

## 2019-08-25 NOTE — Progress Notes (Signed)
Mailed letter with diabetes medication adjustments.

## 2019-08-25 NOTE — Addendum Note (Signed)
Addended by: Metro Kung on: 08/25/2019 10:27 AM   Modules accepted: Orders, SmartSet

## 2019-09-05 ENCOUNTER — Encounter: Payer: Self-pay | Admitting: *Deleted

## 2019-09-05 ENCOUNTER — Telehealth: Payer: Self-pay | Admitting: Internal Medicine

## 2019-09-05 NOTE — Telephone Encounter (Signed)
Spoke with pt and she requested a sooner date in Nov.  Pt rescheduled to 10/18/2019 and is also aware of Covid screening on 10/14/2019.  Pt aware that I will mail out new prep instructions and Covid screening info.  Endo notified.

## 2019-09-05 NOTE — Telephone Encounter (Signed)
[place], TCS BEFORE THANKSGIVING AND SHE WANTS TO RESCHEDULE

## 2019-10-03 DIAGNOSIS — H52223 Regular astigmatism, bilateral: Secondary | ICD-10-CM | POA: Diagnosis not present

## 2019-10-03 DIAGNOSIS — H5213 Myopia, bilateral: Secondary | ICD-10-CM | POA: Diagnosis not present

## 2019-10-03 LAB — HM DIABETES EYE EXAM

## 2019-10-14 ENCOUNTER — Other Ambulatory Visit: Payer: Self-pay

## 2019-10-14 ENCOUNTER — Other Ambulatory Visit (HOSPITAL_COMMUNITY)
Admission: RE | Admit: 2019-10-14 | Discharge: 2019-10-14 | Disposition: A | Discharge status: Home / Self Care | Payer: Medicaid Other | Source: Ambulatory Visit | Attending: Internal Medicine | Admitting: Internal Medicine

## 2019-10-14 DIAGNOSIS — Z20828 Contact with and (suspected) exposure to other viral communicable diseases: Secondary | ICD-10-CM | POA: Diagnosis not present

## 2019-10-14 DIAGNOSIS — Z01812 Encounter for preprocedural laboratory examination: Secondary | ICD-10-CM | POA: Diagnosis not present

## 2019-10-14 LAB — SARS CORONAVIRUS 2 (TAT 6-24 HRS): SARS Coronavirus 2: NEGATIVE

## 2019-10-18 ENCOUNTER — Ambulatory Visit (HOSPITAL_COMMUNITY)
Admission: RE | Admit: 2019-10-18 | Discharge: 2019-10-18 | Disposition: A | Discharge status: Home / Self Care | Payer: Medicaid Other | Attending: Internal Medicine | Admitting: Internal Medicine

## 2019-10-18 ENCOUNTER — Encounter (HOSPITAL_COMMUNITY)
Admission: RE | Disposition: A | Discharge status: Home / Self Care | Payer: Self-pay | Source: Home / Self Care | Attending: Internal Medicine

## 2019-10-18 ENCOUNTER — Encounter (HOSPITAL_COMMUNITY): Payer: Self-pay

## 2019-10-18 ENCOUNTER — Other Ambulatory Visit: Payer: Self-pay

## 2019-10-18 DIAGNOSIS — Z91040 Latex allergy status: Secondary | ICD-10-CM | POA: Insufficient documentation

## 2019-10-18 DIAGNOSIS — Z885 Allergy status to narcotic agent status: Secondary | ICD-10-CM | POA: Diagnosis not present

## 2019-10-18 DIAGNOSIS — E119 Type 2 diabetes mellitus without complications: Secondary | ICD-10-CM | POA: Diagnosis not present

## 2019-10-18 DIAGNOSIS — E785 Hyperlipidemia, unspecified: Secondary | ICD-10-CM | POA: Diagnosis not present

## 2019-10-18 DIAGNOSIS — Z1211 Encounter for screening for malignant neoplasm of colon: Secondary | ICD-10-CM

## 2019-10-18 DIAGNOSIS — J449 Chronic obstructive pulmonary disease, unspecified: Secondary | ICD-10-CM | POA: Diagnosis not present

## 2019-10-18 DIAGNOSIS — Z7984 Long term (current) use of oral hypoglycemic drugs: Secondary | ICD-10-CM | POA: Insufficient documentation

## 2019-10-18 DIAGNOSIS — K635 Polyp of colon: Secondary | ICD-10-CM | POA: Diagnosis not present

## 2019-10-18 DIAGNOSIS — Z881 Allergy status to other antibiotic agents status: Secondary | ICD-10-CM | POA: Insufficient documentation

## 2019-10-18 DIAGNOSIS — I10 Essential (primary) hypertension: Secondary | ICD-10-CM | POA: Diagnosis not present

## 2019-10-18 DIAGNOSIS — Z886 Allergy status to analgesic agent status: Secondary | ICD-10-CM | POA: Diagnosis not present

## 2019-10-18 DIAGNOSIS — D120 Benign neoplasm of cecum: Secondary | ICD-10-CM | POA: Insufficient documentation

## 2019-10-18 DIAGNOSIS — D124 Benign neoplasm of descending colon: Secondary | ICD-10-CM | POA: Diagnosis not present

## 2019-10-18 DIAGNOSIS — Z8249 Family history of ischemic heart disease and other diseases of the circulatory system: Secondary | ICD-10-CM | POA: Diagnosis not present

## 2019-10-18 HISTORY — PX: COLONOSCOPY: SHX5424

## 2019-10-18 HISTORY — PX: POLYPECTOMY: SHX5525

## 2019-10-18 LAB — GLUCOSE, CAPILLARY: Glucose-Capillary: 143 mg/dL — ABNORMAL HIGH (ref 70–99)

## 2019-10-18 SURGERY — COLONOSCOPY
Anesthesia: Moderate Sedation

## 2019-10-18 MED ORDER — STERILE WATER FOR IRRIGATION IR SOLN
Status: DC | PRN
  Administered 2019-10-18: 2.5 mL

## 2019-10-18 MED ORDER — ONDANSETRON HCL 4 MG/2ML IJ SOLN
INTRAMUSCULAR | Status: AC
  Filled 2019-10-18: qty 2

## 2019-10-18 MED ORDER — MEPERIDINE HCL 50 MG/ML IJ SOLN
INTRAMUSCULAR | Status: AC
  Filled 2019-10-18: qty 1

## 2019-10-18 MED ORDER — SODIUM CHLORIDE 0.9 % IV SOLN
INTRAVENOUS | Status: DC
  Administered 2019-10-18: 13:00:00 via INTRAVENOUS

## 2019-10-18 MED ORDER — MIDAZOLAM HCL 5 MG/5ML IJ SOLN
INTRAMUSCULAR | Status: DC | PRN
  Administered 2019-10-18 (×2): 2 mg via INTRAVENOUS
  Administered 2019-10-18: 1 mg via INTRAVENOUS

## 2019-10-18 MED ORDER — ONDANSETRON HCL 4 MG/2ML IJ SOLN
INTRAMUSCULAR | Status: DC | PRN
  Administered 2019-10-18: 4 mg via INTRAVENOUS

## 2019-10-18 MED ORDER — MEPERIDINE HCL 100 MG/ML IJ SOLN
INTRAMUSCULAR | Status: DC | PRN
  Administered 2019-10-18: 15 mg via INTRAVENOUS
  Administered 2019-10-18: 25 mg via INTRAVENOUS

## 2019-10-18 MED ORDER — MIDAZOLAM HCL 5 MG/5ML IJ SOLN
INTRAMUSCULAR | Status: AC
  Filled 2019-10-18: qty 10

## 2019-10-18 NOTE — Op Note (Signed)
[facility] Patient Name: Kayla Choi Procedure Date: 10/18/2019 12:32 PM MRN: [id number] Date of Birth: 06-Apr-1966 Attending MD: Norvel Richards , MD CSN: [id number] Age: 53 Admit Type: Outpatient Procedure:                Colonoscopy Indications:              Screening for colorectal malignant neoplasm Providers:                Norvel Richards, MD, Jeanann Lewandowsky. Sharon Seller, RN,                            Randa Spike, Technician Referring MD:              Medicines:                Midazolam 5 mg IV, Meperidine 40 mg IV Complications:            No immediate complications. Estimated Blood Loss:     Estimated blood loss was minimal. Procedure:                Pre-Anesthesia Assessment:                           - Prior to the procedure, a History and Physical                            was performed, and patient medications and                            allergies were reviewed. The patient's tolerance of                            previous anesthesia was also reviewed. The risks                            and benefits of the procedure and the sedation                            options and risks were discussed with the patient.                            All questions were answered, and informed consent                            was obtained. Prior Anticoagulants: The patient has                            taken no previous anticoagulant or antiplatelet                            agents. ASA Grade Assessment: II - A patient with                            mild systemic disease. After reviewing the risks  and benefits, the patient was deemed in                            satisfactory condition to undergo the procedure.                           After obtaining informed consent, the colonoscope                            was passed under direct vision. Throughout the                            procedure, the patient's blood pressure, pulse, and                             oxygen saturations were monitored continuously. The                            [id number] scope was introduced through                            the anus and advanced to the the cecum, identified                            by appendiceal orifice and ileocecal valve. The                            colonoscopy was performed without difficulty. The                            patient tolerated the procedure well. The quality                            of the bowel preparation was adequate. Scope In: 12:58:26 PM Scope Out: 1:12:20 PM Scope Withdrawal Time: 0 hours 10 minutes 39 seconds  Total Procedure Duration: 0 hours 13 minutes 54 seconds  Findings:      The perianal and digital rectal examinations were normal.      Four semi-pedunculated polyps were found in the descending colon and       cecum. The polyps were 4 to 7 mm in size. These polyps were removed with       a cold snare. Resection and retrieval were complete. Estimated blood       loss was minimal.      The exam was otherwise without abnormality. Rectal vault small. Could       not retroflex. However, it was seen well on face. Appeared normal. Impression:               - Four 4 to 7 mm polyps in the descending colon and                            in the cecum, removed with a cold snare. Resected  and retrieved.                           - The examination was otherwise normal. Moderate Sedation:      Moderate (conscious) sedation was administered by the endoscopy nurse       and supervised by the endoscopist. The following parameters were       monitored: oxygen saturation, heart rate, blood pressure, respiratory       rate, EKG, adequacy of pulmonary ventilation, and response to care.       Total physician intraservice time was 20 minutes. Recommendation:           - Patient has a contact number available for                            emergencies. The signs and  symptoms of potential                            delayed complications were discussed with the                            patient. Return to normal activities tomorrow.                            Written discharge instructions were provided to the                            patient.                           - Resume previous diet.                           - Continue present medications.                           - Repeat colonoscopy date to be determined after                            pending pathology results are reviewed for                            surveillance.                           - Return to GI office (date not yet determined). Procedure Code(s):        --- Professional ---                           [phone], Colonoscopy, flexible; with removal of                            tumor(s), polyp(s), or other lesion(s) by snare                            technique  G0500, Moderate sedation services provided by the                            same physician or other qualified health care                            professional performing a gastrointestinal                            endoscopic service that sedation supports,                            requiring the presence of an independent trained                            observer to assist in the monitoring of the                            patient's level of consciousness and physiological                            status; initial 15 minutes of intra-service time;                            patient age 71 years or older (additional time may                            be reported with [phone], as appropriate) Diagnosis Code(s):        --- Professional ---                           Z12.11, Encounter for screening for malignant                            neoplasm of colon                           K63.5, Polyp of colon CPT copyright 2019 American Medical Association. All rights reserved. The codes  documented in this report are preliminary and upon coder review may  be revised to meet current compliance requirements. Cristopher Estimable. , MD Norvel Richards, MD 10/18/2019 1:23:19 PM This report has been signed electronically. Number of Addenda: 0

## 2019-10-18 NOTE — H&P (Signed)
@LOGO @   Primary Care Physician:  Alycia Rossetti, MD Primary Gastroenterologist:  Dr. Gala Romney  Pre-Procedure History & Physical: HPI:  Kayla Choi is a 53 y.o. female is here for a screening colonoscopy.  First ever average rescreening examination.  Past Medical History:  Diagnosis Date  . Acid reflux   . ADD (attention deficit disorder)   . Anemia    iron deficinecy  . Anxiety    with social phobia  . Asthma   . Balance problem   . Chest pain    that occurs with anxiety  . COPD (chronic obstructive pulmonary disease) ([place])   . Depression   . Diabetes mellitus   . Glaucoma   . Hyperlipidemia   . Hypertension   . Normal cardiac stress test 12/2015   low risk study  . Panic attack   . Prediabetes   . Recurrent falls   . Social phobia     Past Surgical History:  Procedure Laterality Date  . ABDOMINAL HYSTERECTOMY     fibroids, both ovaries left intact  . ANKLE SURGERY    . CHOLECYSTECTOMY    . COSMETIC SURGERY     elbow  . FRACTURE SURGERY     Recurrent elbow surgery  . LIPOMA EXCISION  04/30/2012   Procedure: EXCISION LIPOMA;  Surgeon: Donato Heinz, MD;  Location: [facility];  Service: General;  Laterality: Right;  Excision soft tissue mass right thigh  . ORIF ANKLE FRACTURE Right 12/15/2017   Procedure: OPEN REDUCTION INTERNAL FIXATION (ORIF) RIGHT ANKLE FRACTURE;  Surgeon: Renette Butters, MD;  Location: [facility];  Service: Orthopedics;  Laterality: Right;  . right elbow     reconstruction    Prior to Admission medications   Medication Sig Start Date End Date Taking? Authorizing Provider  acetaminophen (TYLENOL) 325 MG tablet Take 325 mg by mouth every 6 (six) hours as needed for moderate pain or headache.    Yes [provider]  albuterol (PROVENTIL HFA;VENTOLIN HFA) 108 (90 Base) MCG/ACT inhaler Inhale 2 puffs into the lungs every 4 (four) hours as needed for wheezing or shortness of breath. 04/10/17  Yes [place], Modena Nunnery, MD   albuterol (PROVENTIL) (2.5 MG/3ML) 0.083% nebulizer solution Take 3 mLs (2.5 mg total) by nebulization every 4 (four) hours as needed for wheezing or shortness of breath. 04/10/17  Yes [place], Modena Nunnery, MD  glipiZIDE (GLUCOTROL) 5 MG tablet Take 1 tablet (5 mg total) by mouth 2 (two) times daily. 07/14/19  Yes [place], Modena Nunnery, MD  lisinopril-hydrochlorothiazide (PRINZIDE,ZESTORETIC) 20-12.5 MG tablet Take 1 tablet by mouth daily. 02/16/19  Yes [place], Modena Nunnery, MD  Multiple Vitamins-Minerals (HAIR SKIN AND NAILS FORMULA PO) Take 2 tablets by mouth daily.   Yes [provider]  rosuvastatin (CRESTOR) 5 MG tablet Take 1 tablet (5 mg total) by mouth daily. 07/08/19  Yes Alycia Rossetti, MD    Allergies as of 08/25/2019 - Review Complete 08/24/2019  Allergen Reaction Noted  . Aspirin Shortness Of Breath and Itching 09/03/2011  . Metformin and related Anaphylaxis 04/02/2012  . Other Anaphylaxis 04/15/2012  . Latex  01/29/2007  . Neosporin [neomycin-bacitracin zn-polymyx] Other (See Comments) 04/15/2012  . Oxycodone Itching 06/25/2014    Family History  Problem Relation Age of Onset  . Hypertension Mother   . Diabetes Father   . Heart disease Father   . Anesthesia problems Neg Hx   . Malignant hyperthermia Neg Hx   . Hypotension Neg Hx   .  Pseudochol deficiency Neg Hx     Social History   Socioeconomic History  . Marital status: Single    Spouse name: Not on file  . Number of children: Not on file  . Years of education: Not on file  . Highest education level: Not on file  Occupational History  . Not on file  Social Needs  . Financial resource strain: Not on file  . Food insecurity    Worry: Not on file    Inability: Not on file  . Transportation needs    Medical: Not on file    Non-medical: Not on file  Tobacco Use  . Smoking status: Never Smoker  . Smokeless tobacco: Never Used  Substance and Sexual Activity  . Alcohol use: No    Alcohol/week: 0.0 standard  drinks  . Drug use: No  . Sexual activity: Yes    Birth control/protection: Surgical  Lifestyle  . Physical activity    Days per week: Not on file    Minutes per session: Not on file  . Stress: Not on file  Relationships  . Social Herbalist on phone: Not on file    Gets together: Not on file    Attends religious service: Not on file    Active member of club or organization: Not on file    Attends meetings of clubs or organizations: Not on file    Relationship status: Not on file  . Intimate partner violence    Fear of current or ex partner: Not on file    Emotionally abused: Not on file    Physically abused: Not on file    Forced sexual activity: Not on file  Other Topics Concern  . Not on file  Social History Narrative  . Not on file    Review of Systems: See HPI, otherwise negative ROS  Physical Exam: There were no vitals taken for this visit. General:   Alert,  Well-developed, well-nourished, pleasant and cooperative in NAD nchi. No acute distress. Heart:  Regular rate and rhythm; no murmurs, clicks, rubs,  or gallops. Abdomen:  Soft, nontender and nondistended. No masses, hepatosplenomegaly or hernias noted. Normal bowel sounds, without guarding, and without rebound.    Impression/Plan: Kayla Choi is now here to undergo a screening colonoscopy.  First ever average risk screening examination.  Risks, benefits, limitations, imponderables and alternatives regarding colonoscopy have been reviewed with the patient. Questions have been answered. All parties agreeable.     Notice:  This dictation was prepared with Dragon dictation along with smaller phrase technology. Any transcriptional errors that result from this process are unintentional and may not be corrected upon review.

## 2019-10-18 NOTE — Discharge Instructions (Signed)
Colon Polyps  Polyps are tissue growths inside the body. Polyps can grow in many places, including the large intestine (colon). A polyp may be a round bump or a mushroom-shaped growth. You could have one polyp or several. Most colon polyps are noncancerous (benign). However, some colon polyps can become cancerous over time. Finding and removing the polyps early can help prevent this. What are the causes? The exact cause of colon polyps is not known. What increases the risk? You are more likely to develop this condition if you:  Have a family history of colon cancer or colon polyps.  Are older than 29 or older than 45 if you are African American.  Have inflammatory bowel disease, such as ulcerative colitis or Crohn's disease.  Have certain hereditary conditions, such as: ? Familial adenomatous polyposis. ? Lynch syndrome. ? Turcot syndrome. ? Peutz-Jeghers syndrome.  Are overweight.  Smoke cigarettes.  Do not get enough exercise.  Drink too much alcohol.  Eat a diet that is high in fat and red meat and low in fiber.  Had childhood cancer that was treated with abdominal radiation. What are the signs or symptoms? Most polyps do not cause symptoms. If you have symptoms, they may include:  Blood coming from your rectum when having a bowel movement.  Blood in your stool. The stool may look dark red or black.  Abdominal pain.  A change in bowel habits, such as constipation or diarrhea. How is this diagnosed? This condition is diagnosed with a colonoscopy. This is a procedure in which a lighted, flexible scope is inserted into the anus and then passed into the colon to examine the area. Polyps are sometimes found when a colonoscopy is done as part of routine cancer screening tests. How is this treated? Treatment for this condition involves removing any polyps that are found. Most polyps can be removed during a colonoscopy. Those polyps will then be tested for cancer. Additional  treatment may be needed depending on the results of testing. Follow these instructions at home: Lifestyle  Maintain a healthy weight, or lose weight if recommended by your health care provider.  Exercise every day or as told by your health care provider.  Do not use any products that contain nicotine or tobacco, such as cigarettes and e-cigarettes. If you need help quitting, ask your health care provider.  If you drink alcohol, limit how much you have: ? 0-1 drink a day for women. ? 0-2 drinks a day for men.  Be aware of how much alcohol is in your drink. In the [place] one drink equals one 12 oz bottle of beer (355 mL), one 5 oz glass of wine (148 mL), or one 1 oz shot of hard liquor (44 mL). Eating and drinking   Eat foods that are high in fiber, such as fruits, vegetables, and whole grains.  Eat foods that are high in calcium and vitamin D, such as milk, cheese, yogurt, eggs, liver, fish, and broccoli.  Limit foods that are high in fat, such as fried foods and desserts.  Limit the amount of red meat and processed meat you eat, such as hot dogs, sausage, bacon, and lunch meats. General instructions  Keep all follow-up visits as told by your health care provider. This is important. ? This includes having regularly scheduled colonoscopies. ? Talk to your health care provider about when you need a colonoscopy. Contact a health care provider if:  You have new or worsening bleeding during a bowel movement.  You  have new or increased blood in your stool.  You have a change in bowel habits.  You lose weight for no known reason. Summary  Polyps are tissue growths inside the body. Polyps can grow in many places, including the colon.  Most colon polyps are noncancerous (benign), but some can become cancerous over time.  This condition is diagnosed with a colonoscopy.  Treatment for this condition involves removing any polyps that are found. Most polyps can be removed during a  colonoscopy. This information is not intended to replace advice given to you by your health care provider. Make sure you discuss any questions you have with your health care provider. Document Released: 08/20/2004 Document Revised: 03/11/2018 Document Reviewed: 03/11/2018 Elsevier Patient Education  2020 [place].  Colonoscopy Discharge Instructions  Read the instructions outlined below and refer to this sheet in the next few weeks. These discharge instructions provide you with general information on caring for yourself after you leave the hospital. Your doctor may also give you specific instructions. While your treatment has been planned according to the most current medical practices available, unavoidable complications occasionally occur. If you have any problems or questions after discharge, call Dr. Gala Romney at [phone]. ACTIVITY  You may resume your regular activity, but move at a slower pace for the next 24 hours.   Take frequent rest periods for the next 24 hours.   Walking will help get rid of the air and reduce the bloated feeling in your belly (abdomen).   No driving for 24 hours (because of the medicine (anesthesia) used during the test).    Do not sign any important legal documents or operate any machinery for 24 hours (because of the anesthesia used during the test).  NUTRITION  Drink plenty of fluids.   You may resume your normal diet as instructed by your doctor.   Begin with a light meal and progress to your normal diet. Heavy or fried foods are harder to digest and may make you feel sick to your stomach (nauseated).   Avoid alcoholic beverages for 24 hours or as instructed.  MEDICATIONS  You may resume your normal medications unless your doctor tells you otherwise.  WHAT YOU CAN EXPECT TODAY  Some feelings of bloating in the abdomen.   Passage of more gas than usual.   Spotting of blood in your stool or on the toilet paper.  IF YOU HAD POLYPS REMOVED DURING  THE COLONOSCOPY:  No aspirin products for 7 days or as instructed.   No alcohol for 7 days or as instructed.   Eat a soft diet for the next 24 hours.  FINDING OUT THE RESULTS OF YOUR TEST Not all test results are available during your visit. If your test results are not back during the visit, make an appointment with your caregiver to find out the results. Do not assume everything is normal if you have not heard from your caregiver or the medical facility. It is important for you to follow up on all of your test results.  SEEK IMMEDIATE MEDICAL ATTENTION IF:  You have more than a spotting of blood in your stool.   Your belly is swollen (abdominal distention).   You are nauseated or vomiting.   You have a temperature over 101.   You have abdominal pain or discomfort that is severe or gets worse throughout the day.   Colon polyp information provided -4 polyps removed from your colon today  Further  recommendations to follow pending review of  pathology report  At patient request I called daughter Scherrie November at [phone] -got generic voicemail; did not leave a message

## 2019-10-19 LAB — SURGICAL PATHOLOGY

## 2019-10-20 ENCOUNTER — Encounter: Payer: Self-pay | Admitting: Internal Medicine

## 2019-10-21 ENCOUNTER — Encounter (HOSPITAL_COMMUNITY): Payer: Self-pay | Admitting: Internal Medicine

## 2019-10-31 ENCOUNTER — Other Ambulatory Visit (HOSPITAL_COMMUNITY): Payer: Medicaid Other

## 2019-11-02 DIAGNOSIS — H52223 Regular astigmatism, bilateral: Secondary | ICD-10-CM | POA: Diagnosis not present

## 2019-11-02 DIAGNOSIS — H5213 Myopia, bilateral: Secondary | ICD-10-CM | POA: Diagnosis not present

## 2019-11-07 ENCOUNTER — Ambulatory Visit: Payer: Medicaid Other | Admitting: Family Medicine

## 2019-11-11 ENCOUNTER — Encounter: Payer: Self-pay | Admitting: Family Medicine

## 2019-11-11 ENCOUNTER — Ambulatory Visit (INDEPENDENT_AMBULATORY_CARE_PROVIDER_SITE_OTHER): Payer: Medicaid Other | Admitting: Family Medicine

## 2019-11-11 ENCOUNTER — Other Ambulatory Visit: Payer: Self-pay

## 2019-11-11 VITALS — BP 140/88 | HR 90 | Temp 98.7°F | Resp 14 | Ht 60.0 in | Wt 187.0 lb

## 2019-11-11 DIAGNOSIS — L910 Hypertrophic scar: Secondary | ICD-10-CM

## 2019-11-11 DIAGNOSIS — E782 Mixed hyperlipidemia: Secondary | ICD-10-CM | POA: Diagnosis not present

## 2019-11-11 DIAGNOSIS — E669 Obesity, unspecified: Secondary | ICD-10-CM | POA: Diagnosis not present

## 2019-11-11 DIAGNOSIS — E119 Type 2 diabetes mellitus without complications: Secondary | ICD-10-CM

## 2019-11-11 DIAGNOSIS — I10 Essential (primary) hypertension: Secondary | ICD-10-CM | POA: Diagnosis not present

## 2019-11-11 NOTE — Progress Notes (Signed)
   Subjective:    Patient ID: Kayla Choi, female    DOB: 20-Sep-1966, 53 y.o.   MRN: [id number]  Patient presents for Follow-up (is fasting)    Pt here to f/u chronic medical problems, medications reviewed     HTN-she has not taken her medication, has lisinipril HCTZ at home.     Hyperlipidemia- on crestor    DM- last A1C 10.5% in July , she was was started back on GLipizide 5mg  Twice a day, she did not bring her meter today, states sometimes her blood sugar is normal other times normal. She has cut fried foods and doesn't eat as much, states she used to make a cake every week    She had recent colonoscopy- had 4 polyps / tubular adenoma   COPD- no recent exacernations, declines flu shot    No new concerns    Review Of Systems:  GEN- denies fatigue, fever, weight loss,weakness, recent illness HEENT- denies eye drainage, change in vision, nasal discharge, CVS- denies chest pain, palpitations RESP- denies SOB, cough, wheeze ABD- denies N/V, change in stools, abd pain GU- denies dysuria, hematuria, dribbling, incontinence MSK- denies joint pain, muscle aches, injury Neuro- denies headache, dizziness, syncope, seizure activity       Objective:    BP 140/88   Pulse 90   Temp 98.7 F (37.1 C) (Temporal)   Resp 14   Ht 5' (1.524 m)   Wt 187 lb (84.8 kg)   SpO2 97%   BMI 36.52 kg/m  GEN- NAD, alert and oriented x3 HEENT- PERRL, EOMI, non injected sclera, pink conjunctiva, MMM, oropharynx clear Neck- Supple, no thyromegaly CVS- RRR, no murmur RESP-CTAB ABD-NABS,soft,NT,ND EXT- No edema Pulses- Radial, DP- 2+        Assessment & Plan:      Problem List Items Addressed This Visit      Unprioritized   Diabetes mellitus, type II (HCC)    Uncontrolled, now on glipizide Goal is A1C less than 7%  On statin and ACEI       Relevant Orders   Hemoglobin A1c   Lipid Panel   Microalbumin/Creatinine Ratio, Urine   Essential hypertension - Primary    Mildly  elevated no Bp meds this AM No changes  Check fasting labs Pt back on statin drug       Relevant Orders   Comprehensive metabolic panel   CBC with Differential   Hyperlipidemia    Pt back on statin drug       Relevant Orders   Lipid Panel   Keloid scar of skin    F/U on plastic surgery referral Keloids on abd causing discomfort       Obesity (BMI 30-39.9)      Note: This dictation was prepared with Dragon dictation along with smaller phrase technology. Any transcriptional errors that result from this process are unintentional.

## 2019-11-11 NOTE — Assessment & Plan Note (Signed)
F/U on plastic surgery referral Keloids on abd causing discomfort

## 2019-11-11 NOTE — Patient Instructions (Addendum)
F/U 4 months  Referral to plastic surgeon

## 2019-11-11 NOTE — Assessment & Plan Note (Signed)
Uncontrolled, now on glipizide Goal is A1C less than 7%  On statin and ACEI

## 2019-11-11 NOTE — Assessment & Plan Note (Signed)
Mildly elevated no Bp meds this AM No changes  Check fasting labs Pt back on statin drug

## 2019-11-11 NOTE — Assessment & Plan Note (Signed)
Pt back on statin drug

## 2019-11-12 LAB — COMPREHENSIVE METABOLIC PANEL
AG Ratio: 1.5 (calc) (ref 1.0–2.5)
ALT: 21 U/L (ref 6–29)
AST: 17 U/L (ref 10–35)
Albumin: 4.3 g/dL (ref 3.6–5.1)
Alkaline phosphatase (APISO): 55 U/L (ref 37–153)
BUN: 14 mg/dL (ref 7–25)
CO2: 23 mmol/L (ref 20–32)
Calcium: 9.5 mg/dL (ref 8.6–10.4)
Chloride: 102 mmol/L (ref 98–110)
Creat: 0.86 mg/dL (ref 0.50–1.05)
Globulin: 2.8 g/dL (calc) (ref 1.9–3.7)
Glucose, Bld: 226 mg/dL — ABNORMAL HIGH (ref 65–99)
Potassium: 4.5 mmol/L (ref 3.5–5.3)
Sodium: 138 mmol/L (ref 135–146)
Total Bilirubin: 1.2 mg/dL (ref 0.2–1.2)
Total Protein: 7.1 g/dL (ref 6.1–8.1)

## 2019-11-12 LAB — CBC WITH DIFFERENTIAL/PLATELET
Absolute Monocytes: 290 cells/uL (ref 200–950)
Basophils Absolute: 50 cells/uL (ref 0–200)
Basophils Relative: 1 %
Eosinophils Absolute: 120 cells/uL (ref 15–500)
Eosinophils Relative: 2.4 %
HCT: 44.1 % (ref 35.0–45.0)
Hemoglobin: 14.3 g/dL (ref 11.7–15.5)
Lymphs Abs: 1915 cells/uL (ref 850–3900)
MCH: 30.2 pg (ref 27.0–33.0)
MCHC: 32.4 g/dL (ref 32.0–36.0)
MCV: 93 fL (ref 80.0–100.0)
MPV: 11.8 fL (ref 7.5–12.5)
Monocytes Relative: 5.8 %
Neutro Abs: 2625 cells/uL (ref 1500–7800)
Neutrophils Relative %: 52.5 %
Platelets: 326 10*3/uL (ref 140–400)
RBC: 4.74 10*6/uL (ref 3.80–5.10)
RDW: 11.7 % (ref 11.0–15.0)
Total Lymphocyte: 38.3 %
WBC: 5 10*3/uL (ref 3.8–10.8)

## 2019-11-12 LAB — HEMOGLOBIN A1C
Hgb A1c MFr Bld: 8.9 % of total Hgb — ABNORMAL HIGH (ref <5.7)
Mean Plasma Glucose: 209 (calc)
eAG (mmol/L): 11.6 (calc)

## 2019-11-12 LAB — LIPID PANEL
Cholesterol: 240 mg/dL — ABNORMAL HIGH (ref <200)
HDL: 62 mg/dL (ref >=50)
LDL Cholesterol (Calc): 156 mg/dL (calc) — ABNORMAL HIGH
Non-HDL Cholesterol (Calc): 178 mg/dL (calc) — ABNORMAL HIGH (ref <130)
Total CHOL/HDL Ratio: 3.9 (calc) (ref <5.0)
Triglycerides: 106 mg/dL (ref <150)

## 2019-11-12 LAB — MICROALBUMIN / CREATININE URINE RATIO
Creatinine, Urine: 220 mg/dL (ref 20–275)
Microalb Creat Ratio: 31 mcg/mg creat — ABNORMAL HIGH (ref <30)
Microalb, Ur: 6.8 mg/dL

## 2019-11-17 ENCOUNTER — Other Ambulatory Visit: Payer: Self-pay | Admitting: *Deleted

## 2019-11-17 MED ORDER — GLIPIZIDE 10 MG PO TABS: 10.0000 mg | ORAL_TABLET | Freq: Two times a day (BID) | ORAL | 1 refills | Status: DC

## 2019-11-17 MED ORDER — ROSUVASTATIN CALCIUM 10 MG PO TABS: 10.0000 mg | ORAL_TABLET | Freq: Every day | ORAL | 1 refills | Status: DC

## 2019-11-18 ENCOUNTER — Encounter: Payer: Self-pay | Admitting: *Deleted

## 2020-01-23 ENCOUNTER — Other Ambulatory Visit: Payer: Self-pay | Admitting: Family Medicine

## 2020-01-23 MED ORDER — GLIPIZIDE 10 MG PO TABS: 10.0000 mg | ORAL_TABLET | Freq: Two times a day (BID) | ORAL | 1 refills | Status: DC

## 2020-02-10 ENCOUNTER — Other Ambulatory Visit: Payer: Self-pay | Admitting: Family Medicine

## 2020-02-13 ENCOUNTER — Other Ambulatory Visit: Payer: Self-pay

## 2020-02-13 MED ORDER — BLOOD GLUCOSE TEST VI STRP: ORAL_STRIP | 1 refills | Status: DC

## 2020-02-17 ENCOUNTER — Telehealth: Payer: Self-pay | Admitting: Family Medicine

## 2020-02-17 MED ORDER — ACCU-CHEK AVIVA PLUS W/DEVICE KIT: PACK | 1 refills | Status: DC

## 2020-02-17 NOTE — Telephone Encounter (Signed)
Patient would like an rx for a new meter if possible  [employer]

## 2020-02-17 NOTE — Telephone Encounter (Signed)
Prescription sent to pharmacy.

## 2020-03-16 ENCOUNTER — Ambulatory Visit: Payer: Medicaid Other | Admitting: Family Medicine

## 2020-03-23 ENCOUNTER — Other Ambulatory Visit: Payer: Self-pay

## 2020-03-23 ENCOUNTER — Ambulatory Visit: Payer: Medicaid Other | Admitting: Family Medicine

## 2020-03-23 ENCOUNTER — Encounter: Payer: Self-pay | Admitting: Family Medicine

## 2020-03-23 VITALS — BP 132/64 | HR 100 | Temp 98.7°F | Resp 16 | Ht 60.0 in | Wt 191.0 lb

## 2020-03-23 DIAGNOSIS — J410 Simple chronic bronchitis: Secondary | ICD-10-CM

## 2020-03-23 DIAGNOSIS — E782 Mixed hyperlipidemia: Secondary | ICD-10-CM

## 2020-03-23 DIAGNOSIS — E119 Type 2 diabetes mellitus without complications: Secondary | ICD-10-CM | POA: Diagnosis not present

## 2020-03-23 DIAGNOSIS — I10 Essential (primary) hypertension: Secondary | ICD-10-CM | POA: Diagnosis not present

## 2020-03-23 DIAGNOSIS — E669 Obesity, unspecified: Secondary | ICD-10-CM

## 2020-03-23 MED ORDER — SPIRIVA RESPIMAT 1.25 MCG/ACT IN AERS: 2.0000 | INHALATION_SPRAY | Freq: Every day | RESPIRATORY_TRACT | 0 refills | Status: DC

## 2020-03-23 MED ORDER — ALBUTEROL SULFATE HFA 108 (90 BASE) MCG/ACT IN AERS: 2.0000 | INHALATION_SPRAY | RESPIRATORY_TRACT | 2 refills | Status: DC | PRN

## 2020-03-23 NOTE — Assessment & Plan Note (Signed)
Controlled with over use of her rescue inhaler.  She was on Spiriva in the past but due to cost came off of the medication.  The only sample inhalers that we had in the office was for Spiriva Respimat 1.25 side given her that.  She is unable to afford the other inhalers at this time.

## 2020-03-23 NOTE — Assessment & Plan Note (Signed)
Blood pressure controlled no change in medication. 

## 2020-03-23 NOTE — Patient Instructions (Addendum)
COVID Vaccination Information As of right now, we will not be giving COVID-19 vaccines here in our office. It is too many storage and administrating regulations that our office is not equipped to provide at this time.    You can go online at [email or web address]   That website will give you information on all counties.    If not here are the numbers you can call. [place] or [place] [phone] opt.2 [facility] Department [phone]   You can also find information on [email or web address] or call the state's COVID-19 information phone number at 211.   You can also find information in regards to local pharmacies at: [email or web address]  [facility] [phone]  F/U 4 months for Physical

## 2020-03-23 NOTE — Progress Notes (Signed)
Subjective:    Patient ID: Kayla Choi, female    DOB: [birth year], 54 y.o.   MRN: [id number]  Patient presents for Follow-up (is not fasting)  Pt here to f/u  Chronic medical problems and for labs   DM- last A1C 8.9% in December, she did not bring her meter, taking Glipizide 10mg  twice a day, highest 283 fasting, lowest 100, she did not check CBG today     Not eating as much fried foods    COPD- having more trouble with her breathing.  States that she is trying to be active with her grandchild.  But the pollen is causing some issues pain she thinks that there is some mold in her house again.  She has been using her albuterol every day.   HTN- taking BP meds as prescribed without side effects  Hyperlipidemia- taking crestor 10mg     Also requesting handicap sticker due to her chronic ankle pain status post surgery/ some arthritis  Required letter for the power company indicating her COPD Review Of Systems:  GEN- denies fatigue, fever, weight loss,weakness, recent illness HEENT- denies eye drainage, change in vision, nasal discharge, CVS- denies chest pain, palpitations RESP- denies SOB, +cough, +wheeze ABD- denies N/V, change in stools, abd pain GU- denies dysuria, hematuria, dribbling, incontinence MSK+ joint pain, muscle aches, injury Neuro- denies headache, dizziness, syncope, seizure activity       Objective:    BP 132/64   Pulse 100   Temp 98.7 F (37.1 C) (Temporal)   Resp 16   Ht 5' (1.524 m)   Wt 191 lb (86.6 kg)   SpO2 95%   BMI 37.30 kg/m  GEN- NAD, alert and oriented x3 HEENT- PERRL, EOMI, non injected sclera, pink conjunctiva, MMM, oropharynx clear Neck- Supple, no thyromegaly CVS- RRR, no murmur RESP-CTAB, normal work of breathing no wheeze no respiratory distress ABD-NABS,soft,NT,ND EXT- No edema Pulses- Radial, DP- 2+        Assessment & Plan:      Problem List Items Addressed This Visit      Unprioritized   Class 2 obesity   COPD  (chronic obstructive pulmonary disease) (HCC) - Primary    Controlled with over use of her rescue inhaler.  She was on Spiriva in the past but due to cost came off of the medication.  The only sample inhalers that we had in the office was for Spiriva Respimat 1.25 side given her that.  She is unable to afford the other inhalers at this time.      Relevant Medications   albuterol (VENTOLIN HFA) 108 (90 Base) MCG/ACT inhaler   Tiotropium Bromide Monohydrate (SPIRIVA RESPIMAT) 1.25 MCG/ACT AERS   Diabetes mellitus, type II (HCC)    With A1c less than 7%.  Does not tolerate Metformin she is on glipizide she has had some elevated sugars but did not bring her log and will get a good idea of the pattern. Check A1c today  SHe is on statin drug.      Relevant Orders   CBC with Differential/Platelet   Comprehensive metabolic panel   Lipid panel   Hemoglobin A1c   Essential hypertension    Blood pressure controlled no change in medication.      Relevant Orders   CBC with Differential/Platelet   Comprehensive metabolic panel   Lipid panel   Hyperlipidemia      Note: This dictation was prepared with Dragon dictation along with smaller phrase technology. Any transcriptional errors that result from  this process are unintentional.

## 2020-03-23 NOTE — Assessment & Plan Note (Signed)
With A1c less than 7%.  Does not tolerate Metformin she is on glipizide she has had some elevated sugars but did not bring her log and will get a good idea of the pattern. Check A1c today  SHe is on statin drug.

## 2020-03-24 LAB — CBC WITH DIFFERENTIAL/PLATELET
Absolute Monocytes: 422 cells/uL (ref 200–950)
Basophils Absolute: 38 cells/uL (ref 0–200)
Basophils Relative: 0.6 %
Eosinophils Absolute: 113 cells/uL (ref 15–500)
Eosinophils Relative: 1.8 %
HCT: 41.5 % (ref 35.0–45.0)
Hemoglobin: 13.5 g/dL (ref 11.7–15.5)
Lymphs Abs: 2029 cells/uL (ref 850–3900)
MCH: 30.7 pg (ref 27.0–33.0)
MCHC: 32.5 g/dL (ref 32.0–36.0)
MCV: 94.3 fL (ref 80.0–100.0)
MPV: 11.6 fL (ref 7.5–12.5)
Monocytes Relative: 6.7 %
Neutro Abs: 3698 cells/uL (ref 1500–7800)
Neutrophils Relative %: 58.7 %
Platelets: 332 10*3/uL (ref 140–400)
RBC: 4.4 10*6/uL (ref 3.80–5.10)
RDW: 11.7 % (ref 11.0–15.0)
Total Lymphocyte: 32.2 %
WBC: 6.3 10*3/uL (ref 3.8–10.8)

## 2020-03-24 LAB — COMPREHENSIVE METABOLIC PANEL
AG Ratio: 1.4 (calc) (ref 1.0–2.5)
ALT: 25 U/L (ref 6–29)
AST: 16 U/L (ref 10–35)
Albumin: 4.1 g/dL (ref 3.6–5.1)
Alkaline phosphatase (APISO): 52 U/L (ref 37–153)
BUN: 16 mg/dL (ref 7–25)
CO2: 27 mmol/L (ref 20–32)
Calcium: 9.7 mg/dL (ref 8.6–10.4)
Chloride: 105 mmol/L (ref 98–110)
Creat: 0.85 mg/dL (ref 0.50–1.05)
Globulin: 3 g/dL (calc) (ref 1.9–3.7)
Glucose, Bld: 264 mg/dL — ABNORMAL HIGH (ref 65–99)
Potassium: 4.2 mmol/L (ref 3.5–5.3)
Sodium: 140 mmol/L (ref 135–146)
Total Bilirubin: 0.7 mg/dL (ref 0.2–1.2)
Total Protein: 7.1 g/dL (ref 6.1–8.1)

## 2020-03-24 LAB — LIPID PANEL
Cholesterol: 221 mg/dL — ABNORMAL HIGH (ref <200)
HDL: 58 mg/dL (ref >=50)
LDL Cholesterol (Calc): 139 mg/dL (calc) — ABNORMAL HIGH
Non-HDL Cholesterol (Calc): 163 mg/dL (calc) — ABNORMAL HIGH (ref <130)
Total CHOL/HDL Ratio: 3.8 (calc) (ref <5.0)
Triglycerides: 118 mg/dL (ref <150)

## 2020-03-24 LAB — HEMOGLOBIN A1C
Hgb A1c MFr Bld: 10.2 % of total Hgb — ABNORMAL HIGH (ref <5.7)
Mean Plasma Glucose: 246 (calc)
eAG (mmol/L): 13.6 (calc)

## 2020-03-29 ENCOUNTER — Telehealth: Payer: Self-pay | Admitting: *Deleted

## 2020-03-29 ENCOUNTER — Other Ambulatory Visit: Payer: Self-pay | Admitting: *Deleted

## 2020-03-29 MED ORDER — ROSUVASTATIN CALCIUM 20 MG PO TABS: 20.0000 mg | ORAL_TABLET | Freq: Every day | ORAL | 3 refills | Status: DC

## 2020-03-29 MED ORDER — FARXIGA 10 MG PO TABS: 10.0000 mg | ORAL_TABLET | Freq: Every day | ORAL | 3 refills | Status: DC

## 2020-03-29 NOTE — Telephone Encounter (Signed)
Received request from pharmacy for [employer] on Farxiga.   PA submitted via [employer].   Confirmation [id number].  Dx:E11.9- DM.

## 2020-03-30 MED ORDER — FARXIGA 10 MG PO TABS: 10.0000 mg | ORAL_TABLET | Freq: Every day | ORAL | 3 refills | Status: DC

## 2020-03-30 NOTE — Telephone Encounter (Signed)
Received PA determination.   PA [id number] approved 03/29/2020 - 03/29/2021.  Pharmacy made aware.

## 2020-04-23 ENCOUNTER — Ambulatory Visit (INDEPENDENT_AMBULATORY_CARE_PROVIDER_SITE_OTHER): Payer: Medicaid Other | Admitting: Nurse Practitioner

## 2020-04-23 ENCOUNTER — Encounter: Payer: Self-pay | Admitting: Nurse Practitioner

## 2020-04-23 ENCOUNTER — Telehealth: Payer: Self-pay | Admitting: *Deleted

## 2020-04-23 DIAGNOSIS — J441 Chronic obstructive pulmonary disease with (acute) exacerbation: Secondary | ICD-10-CM

## 2020-04-23 MED ORDER — ALBUTEROL SULFATE (2.5 MG/3ML) 0.083% IN NEBU: 2.5000 mg | INHALATION_SOLUTION | RESPIRATORY_TRACT | 2 refills | Status: DC | PRN

## 2020-04-23 MED ORDER — LEVOFLOXACIN 500 MG PO TABS: 500.0000 mg | ORAL_TABLET | Freq: Every day | ORAL | 0 refills | Status: DC

## 2020-04-23 MED ORDER — PREDNISONE 20 MG PO TABS: ORAL_TABLET | ORAL | 0 refills | Status: DC

## 2020-04-23 MED ORDER — LORATADINE 10 MG PO TABS
10.0000 mg | ORAL_TABLET | Freq: Every day | ORAL | 11 refills | Status: DC
Start: 2020-04-23 — End: 2023-04-16

## 2020-04-23 MED ORDER — BENZONATATE 100 MG PO CAPS: 100.0000 mg | ORAL_CAPSULE | Freq: Two times a day (BID) | ORAL | 0 refills | Status: DC | PRN

## 2020-04-23 MED ORDER — IPRATROPIUM BROMIDE 0.02 % IN SOLN: 0.5000 mg | Freq: Four times a day (QID) | RESPIRATORY_TRACT | 2 refills | Status: DC

## 2020-04-23 NOTE — Progress Notes (Signed)
Virtual Visit via Telephone Note  I connected with Kayla Choi on 04/23/20 at  2:15 PM EDT by telephone and verified that I am speaking with the correct person using two identifiers.   I discussed the limitations, risks, security and privacy concerns of performing an evaluation and management service by telephone and the availability of in person appointments. I also discussed with the patient that there may be a patient responsible charge related to this service. The patient expressed understanding and agreed to proceed.   History of Present Illness: Pt is a 54 year old female presenting for a telephone visit for sxs of cough since last Sunday. Her cough is productive at times of white phlem. She has continued to use her albuterol nebulizer and spiriva inhaler but not helping her sxs. She has h/o COPD. She reports that she feels that her sxs are COPD exacerbations that was brought on by her spending time outdoors last weekend and the pollen aggravated her allergies. She denied sick exposures or COVID exposures. She has also started taking mucinex for chest congestion with minimal relief. She denied fever, chills, loss of tase or smell, h/a, cp,ct, gu.gi sxs, pain, or sob.    Observations/Objective: Able to speak full sentences, no harsh coughing. A/ox3.  Past Medical History:  Diagnosis Date  . Acid reflux   . ADD (attention deficit disorder)   . Anemia    iron deficinecy  . Anxiety    with social phobia  . Asthma   . Balance problem   . Chest pain    that occurs with anxiety  . COPD (chronic obstructive pulmonary disease) ([place])   . Depression   . Diabetes mellitus   . Glaucoma   . Hyperlipidemia   . Hypertension   . Normal cardiac stress test 12/2015   low risk study  . Panic attack   . Prediabetes   . Recurrent falls   . Social phobia    Past Surgical History:  Procedure Laterality Date  . ABDOMINAL HYSTERECTOMY     fibroids, both ovaries left intact  . ANKLE SURGERY     . CHOLECYSTECTOMY    . COLONOSCOPY N/A 10/18/2019   Procedure: COLONOSCOPY;  Surgeon: Daneil Dolin, MD;  Location: [facility];  Service: Endoscopy;  Laterality: N/A;  1:00-office rescheduled to 11/10 @ 12:45pm  . COSMETIC SURGERY     elbow  . FRACTURE SURGERY     Recurrent elbow surgery  . LIPOMA EXCISION  04/30/2012   Procedure: EXCISION LIPOMA;  Surgeon: Donato Heinz, MD;  Location: [facility];  Service: General;  Laterality: Right;  Excision soft tissue mass right thigh  . ORIF ANKLE FRACTURE Right 12/15/2017   Procedure: OPEN REDUCTION INTERNAL FIXATION (ORIF) RIGHT ANKLE FRACTURE;  Surgeon: Renette Butters, MD;  Location: [facility];  Service: Orthopedics;  Laterality: Right;  . POLYPECTOMY  10/18/2019   Procedure: POLYPECTOMY;  Surgeon: Daneil Dolin, MD;  Location: [facility];  Service: Endoscopy;;  colon  . right elbow     reconstruction   Current Outpatient Medications on File Prior to Visit  Medication Sig Dispense Refill  . acetaminophen (TYLENOL) 325 MG tablet Take 325 mg by mouth every 6 (six) hours as needed for moderate pain or headache.     . albuterol (VENTOLIN HFA) 108 (90 Base) MCG/ACT inhaler Inhale 2 puffs into the lungs every 4 (four) hours as needed for wheezing or shortness of breath. 18 g 2  . dapagliflozin propanediol (  FARXIGA) 10 MG TABS tablet Take 10 mg by mouth daily before breakfast. 30 tablet 3  . glipiZIDE (GLUCOTROL) 10 MG tablet Take 1 tablet (10 mg total) by mouth 2 (two) times daily before a meal. 180 tablet 1  . lisinopril-hydrochlorothiazide (PRINZIDE,ZESTORETIC) 20-12.5 MG tablet Take 1 tablet by mouth daily. 30 tablet 3  . rosuvastatin (CRESTOR) 20 MG tablet Take 1 tablet (20 mg total) by mouth at bedtime. 90 tablet 3  . Tiotropium Bromide Monohydrate (SPIRIVA RESPIMAT) 1.25 MCG/ACT AERS Inhale 2 puffs into the lungs daily. 4 g 0   No current facility-administered medications on file prior to visit.   Allergies  Allergen  Reactions  . Aspirin Shortness Of Breath and Itching  . Metformin And Related Anaphylaxis  . Other Anaphylaxis    Green peas  . Oxycodone Itching  . Latex Itching and Rash  . Neosporin [Neomycin-Bacitracin Zn-Polymyx] Swelling, Rash and Other (See Comments)    skin peeling   Social History   Socioeconomic History  . Marital status: Single    Spouse name: Not on file  . Number of children: Not on file  . Years of education: Not on file  . Highest education level: Not on file  Occupational History  . Not on file  Tobacco Use  . Smoking status: Never Smoker  . Smokeless tobacco: Never Used  Substance and Sexual Activity  . Alcohol use: No    Alcohol/week: 0.0 standard drinks  . Drug use: No  . Sexual activity: Yes    Birth control/protection: Surgical  Other Topics Concern  . Not on file  Social History Narrative  . Not on file   Social Determinants of Health   Financial Resource Strain:   . Difficulty of Paying Living Expenses:   Food Insecurity:   . Worried About Charity fundraiser in the Last Year:   . Arboriculturist in the Last Year:   Transportation Needs:   . Film/video editor (Medical):   Marland Kitchen Lack of Transportation (Non-Medical):   Physical Activity:   . Days of Exercise per Week:   . Minutes of Exercise per Session:   Stress:   . Feeling of Stress :   Social Connections:   . Frequency of Communication with Friends and Family:   . Frequency of Social Gatherings with Friends and Family:   . Attends Religious Services:   . Active Member of Clubs or Organizations:   . Attends Archivist Meetings:   Marland Kitchen Marital Status:   Intimate Partner Violence:   . Fear of Current or Ex-Partner:   . Emotionally Abused:   Marland Kitchen Physically Abused:   . Sexually Abused:    Breast Cancer-relatedfamily history is not on file. Immunization History  Administered Date(s) Administered  . DTaP 01/30/1966, 02/27/1966, 04/03/1966, 03/17/1967, 03/26/1971  . Influenza  Whole 10/19/2007  . Influenza,inj,Quad PF,6+ Mos 10/06/2014, 10/08/2015, 09/02/2017  . MMR 02/03/1967, 08/02/1996  . Measles 02/03/1967  . Pneumococcal Polysaccharide-23 04/15/2013  . Rubella 02/28/1971  . Smallpox 10/01/1967, 12/17/1967  . Td 08/02/1996  . Tdap 09/02/2017    Assessment and Plan:your sxs are consistant with COPD with acute exacerbation ([place]) - Plan: levofloxacin (LEVAQUIN) 500 MG tablet, predniSONE (DELTASONE) 20 MG tablet, benzonatate (TESSALON) 100 MG capsule, albuterol (PROVENTIL) (2.5 MG/3ML) 0.083% nebulizer solution, ipratropium (ATROVENT) 0.02 % nebulizer solution I have added Atrovent to your nebulizer treatment, refilled albuterol neb per your request, added steroid prednisone, antibiotic, tessalon pearls for cough. It is important that you  avoid allergens and complete a COVID test. Follow up in office for continued or worsening sxs.  Follow Up Instructions:    I discussed the assessment and treatment plan with the patient. The patient was provided an opportunity to ask questions and all were answered. The patient agreed with the plan and demonstrated an understanding of the instructions.   The patient was advised to call back or seek an in-person evaluation if the symptoms worsen or if the condition fails to improve as anticipated.  I provided 15 minutes of non-face-to-face time during this encounter. End time 2:30PM  Annie Main, FNP

## 2020-04-23 NOTE — Telephone Encounter (Signed)
Received call from patient.   Reports that she has had productive cough with white colored mucus x1 week. Denies fever, SOB, sick contacts.   Reports that she has been using inhalers as directed with no relief.   Requested appointment.   Appointment scheduled for Telehealth.

## 2020-05-01 ENCOUNTER — Ambulatory Visit: Payer: Medicaid Other | Attending: Internal Medicine

## 2020-05-01 ENCOUNTER — Other Ambulatory Visit: Payer: Self-pay

## 2020-05-01 DIAGNOSIS — Z20822 Contact with and (suspected) exposure to covid-19: Secondary | ICD-10-CM

## 2020-05-02 LAB — SARS-COV-2, NAA 2 DAY TAT

## 2020-05-02 LAB — NOVEL CORONAVIRUS, NAA: SARS-CoV-2, NAA: NOT DETECTED

## 2020-07-27 ENCOUNTER — Encounter: Payer: Medicaid Other | Admitting: Family Medicine

## 2020-09-19 ENCOUNTER — Other Ambulatory Visit (HOSPITAL_COMMUNITY): Payer: Self-pay | Admitting: Family Medicine

## 2020-09-19 DIAGNOSIS — Z1231 Encounter for screening mammogram for malignant neoplasm of breast: Secondary | ICD-10-CM

## 2020-09-26 ENCOUNTER — Ambulatory Visit (HOSPITAL_COMMUNITY): Payer: Medicaid Other

## 2020-10-10 ENCOUNTER — Other Ambulatory Visit: Payer: Self-pay

## 2020-10-10 ENCOUNTER — Ambulatory Visit (HOSPITAL_COMMUNITY)
Admission: RE | Admit: 2020-10-10 | Discharge: 2020-10-10 | Disposition: A | Discharge status: Home / Self Care | Payer: Medicaid Other | Source: Ambulatory Visit | Attending: Family Medicine | Admitting: Family Medicine

## 2020-10-10 DIAGNOSIS — Z1231 Encounter for screening mammogram for malignant neoplasm of breast: Secondary | ICD-10-CM | POA: Diagnosis not present

## 2020-10-15 ENCOUNTER — Other Ambulatory Visit (HOSPITAL_COMMUNITY): Payer: Self-pay | Admitting: Family Medicine

## 2020-10-15 DIAGNOSIS — R928 Other abnormal and inconclusive findings on diagnostic imaging of breast: Secondary | ICD-10-CM

## 2020-10-23 ENCOUNTER — Ambulatory Visit (HOSPITAL_COMMUNITY)
Admission: RE | Admit: 2020-10-23 | Discharge: 2020-10-23 | Disposition: A | Discharge status: Home / Self Care | Payer: Medicaid Other | Source: Ambulatory Visit | Attending: Family Medicine | Admitting: Family Medicine

## 2020-10-23 ENCOUNTER — Other Ambulatory Visit: Payer: Self-pay

## 2020-10-23 DIAGNOSIS — R928 Other abnormal and inconclusive findings on diagnostic imaging of breast: Secondary | ICD-10-CM | POA: Insufficient documentation

## 2020-10-23 DIAGNOSIS — R922 Inconclusive mammogram: Secondary | ICD-10-CM | POA: Diagnosis not present

## 2020-10-26 ENCOUNTER — Other Ambulatory Visit: Payer: Self-pay

## 2020-10-26 DIAGNOSIS — L723 Sebaceous cyst: Secondary | ICD-10-CM

## 2020-10-26 DIAGNOSIS — L720 Epidermal cyst: Secondary | ICD-10-CM

## 2020-11-06 ENCOUNTER — Encounter: Payer: Self-pay | Admitting: General Surgery

## 2020-11-06 ENCOUNTER — Ambulatory Visit (INDEPENDENT_AMBULATORY_CARE_PROVIDER_SITE_OTHER): Payer: Medicaid Other | Admitting: General Surgery

## 2020-11-06 ENCOUNTER — Other Ambulatory Visit: Payer: Self-pay

## 2020-11-06 VITALS — BP 169/111 | HR 90 | Temp 98.2°F | Resp 16 | Ht 61.0 in | Wt 190.0 lb

## 2020-11-06 DIAGNOSIS — L910 Hypertrophic scar: Secondary | ICD-10-CM

## 2020-11-06 DIAGNOSIS — L723 Sebaceous cyst: Secondary | ICD-10-CM | POA: Diagnosis not present

## 2020-11-06 NOTE — Patient Instructions (Signed)
Epidermal Cyst  An epidermal cyst is a sac made of skin tissue. The sac contains a substance called keratin. Keratin is a protein that is normally secreted through the hair follicles. When keratin becomes trapped in the top layer of skin (epidermis), it can form an epidermal cyst. Epidermal cysts can be found anywhere on your body. These cysts are usually harmless (benign), and they may not cause symptoms unless they become infected. What are the causes? This condition may be caused by:  A blocked hair follicle.  A hair that curls and re-enters the skin instead of growing straight out of the skin (ingrown hair).  A blocked pore.  Irritated skin.  An injury to the skin.  Certain conditions that are passed along from parent to child (inherited).  Human papillomavirus (HPV).  Long-term (chronic) sun damage to the skin. What increases the risk? The following factors may make you more likely to develop an epidermal cyst:  Having acne.  Being overweight.  Being 2-58 years old. What are the signs or symptoms? The only symptom of this condition may be a small, painless lump underneath the skin. When an epidermal cyst ruptures, it may become infected. Symptoms may include:  Redness.  Inflammation.  Tenderness.  Warmth.  Fever.  Keratin draining from the cyst. Keratin is grayish-white, bad-smelling substance.  Pus draining from the cyst. How is this diagnosed? This condition is diagnosed with a physical exam.  In some cases, you may have a sample of tissue (biopsy) taken from your cyst to be examined under a microscope or tested for bacteria.  You may be referred to a health care provider who specializes in skin care (dermatologist). How is this treated? In many cases, epidermal cysts go away on their own without treatment. If a cyst becomes infected, treatment may include:  Opening and draining the cyst, done by a health care provider. After draining, minor surgery  to remove the rest of the cyst may be done.  Antibiotic medicine.  Injections of medicines (steroids) that help to reduce inflammation.  Surgery to remove the cyst. Surgery may be done if the cyst: ? Becomes large. ? Bothers you. ? Has a chance of turning into cancer.  Do not try to open a cyst yourself. Follow these instructions at home:  Take over-the-counter and prescription medicines only as told by your health care provider.  If you were prescribed an antibiotic medicine, take it it as told by your health care provider. Do not stop using the antibiotic even if you start to feel better.  Keep the area around your cyst clean and dry.  Wear loose, dry clothing.  Avoid touching your cyst.  Check your cyst every day for signs of infection. Check for: ? Redness, swelling, or pain. ? Fluid or blood. ? Warmth. ? Pus or a bad smell.  Keep all follow-up visits as told by your health care provider. This is important. How is this prevented?  Wear clean, dry, clothing.  Avoid wearing tight clothing.  Keep your skin clean and dry. Take showers or baths every day. Contact a health care provider if:  Your cyst develops symptoms of infection.  Your condition is not improving or is getting worse.  You develop a cyst that looks different from other cysts you have had.  You have a fever. Get help right away if:  Redness spreads from the cyst into the surrounding area. Summary  An epidermal cyst is a sac made of skin tissue. These cysts are  usually harmless (benign), and they may not cause symptoms unless they become infected.  If a cyst becomes infected, treatment may include surgery to open and drain the cyst, or to remove it. Treatment may also include medicines by mouth or through an injection.  Take over-the-counter and prescription medicines only as told by your health care provider. If you were prescribed an antibiotic medicine, take it as told by your health care  provider. Do not stop using the antibiotic even if you start to feel better.  Contact a health care provider if your condition is not improving or is getting worse.  Keep all follow-up visits as told by your health care provider. This is important. This information is not intended to replace advice given to you by your health care provider. Make sure you discuss any questions you have with your health care provider. Document Revised: 03/17/2019 Document Reviewed: 06/07/2018 Elsevier Patient Education  2020 [place].   Epidermal Cyst Removal  Epidermal cyst removal is a procedure to remove a sac of oily material (keratin) that has formed under your skin (epidermal cyst). Epidermal cysts may also be called epidermoid cysts, sebaceous cysts, or keratin cysts. Normally, the skin secretes this oily material through a gland or a hair follicle. However, when a skin gland or hair follicle becomes blocked, an epidermal cyst can form. You may need this procedure if you have an epidermal cyst that becomes large, uncomfortable, or infected. Tell a health care provider about:  Any allergies you have.  All medicines you are taking, including vitamins, herbs, eye drops, creams, and over-the-counter medicines.  Any problems you or family members have had with anesthetic medicines.  Any blood disorders you have.  Any surgeries you have had.  Any medical conditions you have now or have had.  Whether you are pregnant or may be pregnant. What are the risks? Generally, this is a safe procedure. However, problems may occur, including:  Development of another cyst.  Bleeding.  Infection.  Scarring. What happens before the procedure?  Ask your health care provider about: ? Changing or stopping your regular medicines. This is especially important if you are taking diabetes medicines or blood thinners. ? Taking medicines such as aspirin and ibuprofen. These medicines can thin your blood. Do not  take these medicines unless your health care provider tells you to take them. ? Taking over-the-counter medicines, vitamins, herbs, and supplements.  If you have an infected cyst, you may have to take antibiotic medicine before the cyst removal. Take your antibiotic as told by your health care provider. Do not stop taking the antibiotic even if you start to feel better.  Take a shower on the morning of your procedure. Your health care provider may ask you to use a germ-killing soap. What happens during the procedure?   You will be given a medicine to numb the area (local anesthetic).  The skin around the cyst will be cleaned with a germ-killing solution.  The health care provider will make a small incision in your skin over the cyst.  The health care provider will separate the cyst from the surrounding tissues that are under your skin.  If possible, the cyst will be removed undamaged (intact).  If the cyst bursts (ruptures), it will be removed in pieces.  After the cyst is removed, the health care provider will control any bleeding and close the incision with small stitches (sutures). Small incisions may not need sutures, and the bleeding will be controlled by applying  direct pressure with gauze.  The health care provider may apply antibiotic ointment and a bandage (dressing) over the incision. The procedure may vary among health care providers and hospitals. What happens after the procedure?  If your cyst ruptured during the procedure, you may need an antibiotic. If you are prescribed an antibiotic medicine or ointment, take or apply it as told by your health care provider. Do not stop using the antibiotic even if you start to feel better. Summary  Epidermal cyst removal is a procedure to remove a sac of oily material (keratin) that has formed under your skin (epidermal cyst).  You may need this procedure if you have an epidermal cyst that becomes large, uncomfortable, or  infected.  The health care provider will make a small incision in your skin to remove the cyst.  If you are prescribed an antibiotic medicine before the procedure, after the procedure, or both, use the antibiotic as told by your health care provider. Do not stop using the antibiotic even if you start to feel better. This information is not intended to replace advice given to you by your health care provider. Make sure you discuss any questions you have with your health care provider. Document Revised: 03/17/2019 Document Reviewed: 09/17/2017 Elsevier Patient Education  [place].

## 2020-11-06 NOTE — Progress Notes (Signed)
[facility] History and Physical  Reason for Referral: Right axillary cyst  Referring Physician:  Alycia Rossetti, MD   Chief Complaint    New Patient (Initial Visit)      Kayla Choi is a 54 y.o. female.  HPI:  Kayla Choi is a 54 yo who has ADD, COPD, DM, HLD who keloids and has noticed a foul smell in her axilla for several years. She reports a lump in the area and says that it has never become inflamed or red but she has required antibiotics for it one time.  She says that odor bothers her the most because she feels others can smell it.   She has a history of fairly bad keloid in her cholecystectomy sites and prior hysterectomy incision. She has spoken with another physician in the past about excision of the keloid but this was going to require significant out of pocket money and she opted to not go that route.  Past Medical History:  Diagnosis Date  . Acid reflux   . ADD (attention deficit disorder)   . Anemia    iron deficinecy  . Anxiety    with social phobia  . Asthma   . Balance problem   . Chest pain    that occurs with anxiety  . COPD (chronic obstructive pulmonary disease) ([place])   . Depression   . Diabetes mellitus   . Glaucoma   . Hyperlipidemia   . Hypertension   . Normal cardiac stress test 12/2015   low risk study  . Panic attack   . Prediabetes   . Recurrent falls   . Social phobia     Past Surgical History:  Procedure Laterality Date  . ABDOMINAL HYSTERECTOMY     fibroids, both ovaries left intact  . ANKLE SURGERY    . CHOLECYSTECTOMY    . COLONOSCOPY N/A 10/18/2019   Procedure: COLONOSCOPY;  Surgeon: Daneil Dolin, MD;  Location: [facility];  Service: Endoscopy;  Laterality: N/A;  1:00-office rescheduled to 11/10 @ 12:45pm  . COSMETIC SURGERY     elbow  . FRACTURE SURGERY     Recurrent elbow surgery  . LIPOMA EXCISION  04/30/2012   Procedure: EXCISION LIPOMA;  Surgeon: Donato Heinz, MD;  Location: [facility];  Service:  General;  Laterality: Right;  Excision soft tissue mass right thigh  . ORIF ANKLE FRACTURE Right 12/15/2017   Procedure: OPEN REDUCTION INTERNAL FIXATION (ORIF) RIGHT ANKLE FRACTURE;  Surgeon: Renette Butters, MD;  Location: [facility];  Service: Orthopedics;  Laterality: Right;  . POLYPECTOMY  10/18/2019   Procedure: POLYPECTOMY;  Surgeon: Daneil Dolin, MD;  Location: [facility];  Service: Endoscopy;;  colon  . right elbow     reconstruction    Family History  Problem Relation Age of Onset  . Hypertension Mother   . Diabetes Father   . Heart disease Father   . Anesthesia problems Neg Hx   . Malignant hyperthermia Neg Hx   . Hypotension Neg Hx   . Pseudochol deficiency Neg Hx     Social History   Tobacco Use  . Smoking status: Never Smoker  . Smokeless tobacco: Never Used  Vaping Use  . Vaping Use: Never used  Substance Use Topics  . Alcohol use: No    Alcohol/week: 0.0 standard drinks  . Drug use: No    Medications: I have reviewed the patient's current medications. Allergies as of 11/06/2020  Reactions   Aspirin Shortness Of Breath, Itching   Metformin And Related Anaphylaxis   Other Anaphylaxis   Green peas   Oxycodone Itching   Latex Itching, Rash   Neosporin [neomycin-bacitracin Zn-polymyx] Swelling, Rash, Other (See Comments)   skin peeling      Medication List       Accurate as of November 06, 2020 12:11 PM. If you have any questions, ask your nurse or doctor.        STOP taking these medications   benzonatate 100 MG capsule Commonly known as: TESSALON Stopped by: Virl Cagey, MD   ipratropium 0.02 % nebulizer solution Commonly known as: ATROVENT Stopped by: Virl Cagey, MD   levofloxacin 500 MG tablet Commonly known as: LEVAQUIN Stopped by: Virl Cagey, MD   predniSONE 20 MG tablet Commonly known as: DELTASONE Stopped by: Virl Cagey, MD     TAKE these medications   acetaminophen 325 MG  tablet Commonly known as: TYLENOL Take 325 mg by mouth every 6 (six) hours as needed for moderate pain or headache.   albuterol 108 (90 Base) MCG/ACT inhaler Commonly known as: VENTOLIN HFA Inhale 2 puffs into the lungs every 4 (four) hours as needed for wheezing or shortness of breath.   albuterol (2.5 MG/3ML) 0.083% nebulizer solution Commonly known as: PROVENTIL Take 3 mLs (2.5 mg total) by nebulization every 4 (four) hours as needed for wheezing or shortness of breath.   Farxiga 10 MG Tabs tablet Generic drug: dapagliflozin propanediol Take 10 mg by mouth daily before breakfast.   glipiZIDE 10 MG tablet Commonly known as: GLUCOTROL Take 1 tablet (10 mg total) by mouth 2 (two) times daily before a meal.   lisinopril-hydrochlorothiazide 20-12.5 MG tablet Commonly known as: ZESTORETIC Take 1 tablet by mouth daily.   loratadine 10 MG tablet Commonly known as: CLARITIN Take 1 tablet (10 mg total) by mouth daily.   rosuvastatin 20 MG tablet Commonly known as: Crestor Take 1 tablet (20 mg total) by mouth at bedtime.   Spiriva Respimat 1.25 MCG/ACT Aers Generic drug: Tiotropium Bromide Monohydrate Inhale 2 puffs into the lungs daily.        ROS:  A comprehensive review of systems was negative except for: Integument/breast: positive for keloid issues on scars Musculoskeletal: positive for back pain, neck pain and joint pain Neurological: positive for dizziness and headaches Endocrine: positive for cold intolerance, tired sluggish  Blood pressure (!) 169/111, pulse 90, temperature 98.2 F (36.8 C), temperature source Oral, resp. rate 16, height 5\' 1"  (1.549 m), weight 190 lb (86.2 kg), SpO2 94 %. Physical Exam Vitals reviewed.  Constitutional:      Appearance: She is normal weight.  HENT:     Head: Normocephalic.     Nose: Nose normal.     Mouth/Throat:     Mouth: Mucous membranes are moist.  Eyes:     Extraocular Movements: Extraocular movements intact.    Cardiovascular:     Rate and Rhythm: Normal rate and regular rhythm.  Pulmonary:     Effort: Pulmonary effort is normal.     Breath sounds: Normal breath sounds.  Abdominal:     General: There is no distension.     Palpations: Abdomen is soft.     Tenderness: There is no abdominal tenderness.     Comments: Large keloid on hysterectomy site and gallbladder extraction site  Musculoskeletal:        General: Normal range of motion.     Cervical  back: Normal range of motion.  Skin:    General: Skin is warm.  Neurological:     General: No focal deficit present.     Mental Status: She is alert and oriented to person, place, and time.  Psychiatric:        Mood and Affect: Mood normal.        Behavior: Behavior normal.        Thought Content: Thought content normal.        Judgment: Judgment normal.     Results: CLINICAL DATA:  Screening recall for possible right breast asymmetry. Patient also reports a palpable area of concern in the right axilla which has been present for approximately 1 year however is increasing in size and is sometimes foul/sour smelling.  EXAM: DIGITAL DIAGNOSTIC UNILATERAL RIGHT MAMMOGRAM WITH TOMO AND CAD; ULTRASOUND RIGHT BREAST LIMITED  COMPARISON:  Previous exams.  ACR Breast Density Category b: There are scattered areas of fibroglandular density.  FINDINGS: Additional tomograms were performed of the right breast. The initially questioned possible right breast asymmetry is felt to resolve on the additional imaging with findings likely related to overlapping fibroglandular tissue.  Mammographic images were processed with CAD.  Physical examination at site of palpable concern in the right axilla reveals a firm mass within the skin.  Targeted ultrasound of the outer right breast was performed. No suspicious masses or abnormality fibroglandular tissue identified. Targeted ultrasound of the right axilla in region palpable concern was  performed demonstrating a heterogeneous cystic mass within the skin measuring 2.1 x 1.3 x 2.1 cm. Findings are most compatible with a sebaceous cyst.  IMPRESSION: 1.  Sebaceous cyst at site of palpable concern in the right axilla.  2.  No findings of malignancy in the right breast.  RECOMMENDATION: 1. Consider surgical/dermatologic consultation for excision of the sebaceous cyst in the right axilla given progressive enlargement per the patient and tenderness.  2.  Screening mammogram in one year.(Code:SM-B-01Y)  I have discussed the findings and recommendations with the patient. If applicable, a reminder letter will be sent to the patient regarding the next appointment.  BI-RADS CATEGORY  2: Benign.   Electronically Signed   By: Everlean Alstrom M.D.   On: 10/23/2020 08:47  Assessment & Plan:  Alysah Carton is a 54 y.o. female with a cyst in her right axilla that is superficial and palpable. She can smell drainage and it has been infected once requiring antibiotics. She wants it excised but realizes that she may keloid. We have discussed the silicone gel sheets and keeping area moist to prevent keloid.    -Discussed risk of bleeding, infection, need for packing, risk of keloid formation, risk of recurrence. She wants to get this done in the operating room with sedation.  -Discussed preop COVID testing   Will look into getting silicone sheets for her dressing, will see if the OR has any  All questions were answered to the satisfaction of the patient.    Virl Cagey 11/06/2020, 12:11 PM

## 2020-11-07 ENCOUNTER — Ambulatory Visit (INDEPENDENT_AMBULATORY_CARE_PROVIDER_SITE_OTHER): Payer: Medicaid Other | Admitting: Family Medicine

## 2020-11-07 ENCOUNTER — Encounter: Payer: Self-pay | Admitting: Family Medicine

## 2020-11-07 VITALS — BP 158/88 | HR 82 | Temp 98.0°F | Resp 14 | Ht 61.0 in | Wt 191.0 lb

## 2020-11-07 DIAGNOSIS — J42 Unspecified chronic bronchitis: Secondary | ICD-10-CM | POA: Diagnosis not present

## 2020-11-07 DIAGNOSIS — Z1159 Encounter for screening for other viral diseases: Secondary | ICD-10-CM | POA: Diagnosis not present

## 2020-11-07 DIAGNOSIS — G8929 Other chronic pain: Secondary | ICD-10-CM | POA: Diagnosis not present

## 2020-11-07 DIAGNOSIS — E669 Obesity, unspecified: Secondary | ICD-10-CM | POA: Diagnosis not present

## 2020-11-07 DIAGNOSIS — Z0001 Encounter for general adult medical examination with abnormal findings: Secondary | ICD-10-CM | POA: Diagnosis not present

## 2020-11-07 DIAGNOSIS — Z9889 Other specified postprocedural states: Secondary | ICD-10-CM

## 2020-11-07 DIAGNOSIS — E1169 Type 2 diabetes mellitus with other specified complication: Secondary | ICD-10-CM

## 2020-11-07 DIAGNOSIS — Z23 Encounter for immunization: Secondary | ICD-10-CM | POA: Diagnosis not present

## 2020-11-07 DIAGNOSIS — M25571 Pain in right ankle and joints of right foot: Secondary | ICD-10-CM

## 2020-11-07 DIAGNOSIS — Z0000 Encounter for general adult medical examination without abnormal findings: Secondary | ICD-10-CM

## 2020-11-07 DIAGNOSIS — I10 Essential (primary) hypertension: Secondary | ICD-10-CM | POA: Diagnosis not present

## 2020-11-07 NOTE — Assessment & Plan Note (Signed)
FLu shot given No recent exacerbations Continue Spiriva and prn albuterol

## 2020-11-07 NOTE — Progress Notes (Signed)
Subjective:    Patient ID: Kayla Choi, female    DOB: Apr 08, 1966, 54 y.o.   MRN: [id number]  Patient presents for Annual Exam (is not fasting)   Pt here for CPE     She is scheduled for December 15th for surgery for cyst removal from axilla    DM- last A1C 10.2% in April 2021, she is taking glipizide 10mg  twice a day and farxiga   highest is 142 fasting per report , no hypoglycemia    She has noticed some headaches but takes meds on empty stomach    Hyperlipidemia - taking crestor regulary     HTN- taking lisinopril HCTZ , but states she was mad today and her bp went up    She had ankle surgery due to fracture has plate and screws in Jan 2019 He has been having pain on and off, bt then a few days was running twisted ankle and fell    COPD- Albuterol prn, spiriva   Colonoscopy UTD Mammogram UTD, this is when cyst was found   Immunizations- she has not had covid or flu shot   December 8th has appt [facility] in [place]:  GEN- denies fatigue, fever, weight loss,weakness, recent illness HEENT- denies eye drainage, change in vision, nasal discharge, CVS- denies chest pain, palpitations RESP- denies SOB, cough, wheeze ABD- denies N/V, change in stools, abd pain GU- denies dysuria, hematuria, dribbling, incontinence MSK- denies joint pain, muscle aches, injury Neuro- denies headache, dizziness, syncope, seizure activity       Objective:    BP (!) 158/88 (BP Location: Right Arm, Patient Position: Sitting, Cuff Size: Normal)   Pulse 82   Temp 98 F (36.7 C) (Temporal)   Resp 14   Ht 5\' 1"  (1.549 m)   Wt 191 lb (86.6 kg)   SpO2 98%   BMI 36.09 kg/m  GEN- NAD, alert and oriented x3 HEENT- PERRL, EOMI, non injected sclera, pink conjunctiva, MMM, oropharynx clear Neck- Supple, no thyromegaly CVS- RRR, no murmur RESP-CTAB ABD-NABS,soft,NT,ND EXT- No edema Pulses- Radial, DP- 2+        Assessment & Plan:      Problem List  Items Addressed This Visit      Unprioritized   Class 2 obesity   COPD (chronic obstructive pulmonary disease) (HCC)    FLu shot given No recent exacerbations Continue Spiriva and prn albuterol      Diabetes mellitus, type II (HCC)    DM type 2 with hyperlipidemia/obesity associated Uncontrolled Goal A1C less than 7%      Relevant Orders   Hemoglobin A1c   Microalbumin / creatinine urine ratio   [facility] DIABETES FOOT EXAM (Completed)   Essential hypertension    Typically controlled Recheck BP at home Check labs No changes today       Relevant Orders   CBC with Differential/Platelet   Comprehensive metabolic panel   Lipid panel    Other Visit Diagnoses    Routine general medical examination at a health care facility    -  Primary   CPE done flu shot given, labs obtained   Relevant Orders   HIV Antibody (routine testing w rflx)   History of ankle surgery       Relevant Orders   Ambulatory referral to Orthopedic Surgery   Chronic pain of right ankle       referral back to ortho for re-evaluation   Relevant Orders   Ambulatory  referral to Orthopedic Surgery   Need for hepatitis C screening test       Relevant Orders   Hepatitis C antibody      Note: This dictation was prepared with Dragon dictation along with smaller phrase technology. Any transcriptional errors that result from this process are unintentional.

## 2020-11-07 NOTE — Assessment & Plan Note (Signed)
DM type 2 with hyperlipidemia/obesity associated Uncontrolled Goal A1C less than 7%

## 2020-11-07 NOTE — Patient Instructions (Addendum)
Referral to Dr. Maretta Los to recheck ankle We will call with lab results F/U 4 months

## 2020-11-07 NOTE — Assessment & Plan Note (Signed)
Typically controlled Recheck BP at home Check labs No changes today

## 2020-11-08 LAB — COMPREHENSIVE METABOLIC PANEL
AG Ratio: 1.3 (calc) (ref 1.0–2.5)
ALT: 29 U/L (ref 6–29)
AST: 19 U/L (ref 10–35)
Albumin: 4.1 g/dL (ref 3.6–5.1)
Alkaline phosphatase (APISO): 63 U/L (ref 37–153)
BUN: 14 mg/dL (ref 7–25)
CO2: 24 mmol/L (ref 20–32)
Calcium: 9.6 mg/dL (ref 8.6–10.4)
Chloride: 103 mmol/L (ref 98–110)
Creat: 1.04 mg/dL (ref 0.50–1.05)
Globulin: 3.1 g/dL (calc) (ref 1.9–3.7)
Glucose, Bld: 287 mg/dL — ABNORMAL HIGH (ref 65–99)
Potassium: 4.3 mmol/L (ref 3.5–5.3)
Sodium: 138 mmol/L (ref 135–146)
Total Bilirubin: 0.8 mg/dL (ref 0.2–1.2)
Total Protein: 7.2 g/dL (ref 6.1–8.1)

## 2020-11-08 LAB — CBC WITH DIFFERENTIAL/PLATELET
Absolute Monocytes: 461 cells/uL (ref 200–950)
Basophils Absolute: 51 cells/uL (ref 0–200)
Basophils Relative: 0.8 %
Eosinophils Absolute: 147 cells/uL (ref 15–500)
Eosinophils Relative: 2.3 %
HCT: 42.2 % (ref 35.0–45.0)
Hemoglobin: 13.6 g/dL (ref 11.7–15.5)
Lymphs Abs: 2330 cells/uL (ref 850–3900)
MCH: 30 pg (ref 27.0–33.0)
MCHC: 32.2 g/dL (ref 32.0–36.0)
MCV: 93.2 fL (ref 80.0–100.0)
MPV: 11.8 fL (ref 7.5–12.5)
Monocytes Relative: 7.2 %
Neutro Abs: 3411 cells/uL (ref 1500–7800)
Neutrophils Relative %: 53.3 %
Platelets: 359 10*3/uL (ref 140–400)
RBC: 4.53 10*6/uL (ref 3.80–5.10)
RDW: 11.7 % (ref 11.0–15.0)
Total Lymphocyte: 36.4 %
WBC: 6.4 10*3/uL (ref 3.8–10.8)

## 2020-11-08 LAB — HEMOGLOBIN A1C
Hgb A1c MFr Bld: 11.9 % of total Hgb — ABNORMAL HIGH (ref <5.7)
Mean Plasma Glucose: 295 (calc)
eAG (mmol/L): 16.3 (calc)

## 2020-11-08 LAB — LIPID PANEL
Cholesterol: 259 mg/dL — ABNORMAL HIGH (ref <200)
HDL: 54 mg/dL (ref >=50)
LDL Cholesterol (Calc): 166 mg/dL (calc) — ABNORMAL HIGH
Non-HDL Cholesterol (Calc): 205 mg/dL (calc) — ABNORMAL HIGH (ref <130)
Total CHOL/HDL Ratio: 4.8 (calc) (ref <5.0)
Triglycerides: 218 mg/dL — ABNORMAL HIGH (ref <150)

## 2020-11-08 LAB — HEPATITIS C ANTIBODY
Hepatitis C Ab: NONREACTIVE
SIGNAL TO CUT-OFF: 0.01 (ref <1.00)

## 2020-11-08 LAB — MICROALBUMIN / CREATININE URINE RATIO
Creatinine, Urine: 242 mg/dL (ref 20–275)
Microalb Creat Ratio: 45 mcg/mg creat — ABNORMAL HIGH (ref <30)
Microalb, Ur: 10.8 mg/dL

## 2020-11-08 LAB — HIV ANTIBODY (ROUTINE TESTING W REFLEX): HIV 1&2 Ab, 4th Generation: NONREACTIVE

## 2020-11-09 NOTE — Patient Instructions (Signed)
Kayla Choi  11/09/2020     @PREFPERIOPPHARMACY @   Your procedure is scheduled on  11/21/2020.  Report to [facility] at  0800  A.M.  Call this number if you have problems the morning of surgery:  [phone]   Remember:  Do not eat or drink after midnight.                         Take these medicines the morning of surgery with A SIP OF WATER Use your inhalers and your nebulizer before you come. DO NOT take any medications for diabetes the morning of your procedure.    Do not wear jewelry, make-up or nail polish.  Do not wear lotions, powders, or perfumes, or deodorants. Please brush your teeth.  Do not shave 48 hours prior to surgery.  Men may shave face and neck.  Do not bring valuables to the hospital.  [facility] is not responsible for any belongings or valuables.  Contacts, dentures or bridgework may not be worn into surgery.  Leave your suitcase in the car.  After surgery it may be brought to your room.  For patients admitted to the hospital, discharge time will be determined by your treatment team.  Patients discharged the day of surgery will not be allowed to drive home.   Name and phone number of your driver:   family Special instructions:  DO NOT smoke the morning of your procedure.  Please read over the following fact sheets that you were given. Anesthesia Post-op Instructions and Care and Recovery After Surgery       Epidermal Cyst Removal, Care After This sheet gives you information about how to care for yourself after your procedure. Your health care provider may also give you more specific instructions. If you have problems or questions, contact your health care provider. What can I expect after the procedure? After the procedure, it is common to have:  Soreness in the area where your cyst was removed.  Tightness or itchiness from the stitches (sutures) in your skin. Follow these instructions at home: Medicines  Take over-the-counter  and prescription medicines only as told by your health care provider.  If you were prescribed an antibiotic medicine or ointment, take or apply it as told by your health care provider. Do not stop using the antibiotic even if you start to feel better. Incision care   Follow instructions from your health care provider about how to take care of your incision. Make sure you: ? Wash your hands with soap and water before you change your bandage (dressing). If soap and water are not available, use hand sanitizer. ? Change your dressing as told by your health care provider. ? Leave sutures, skin glue, or adhesive strips in place. These skin closures may need to stay in place for 1-2 weeks or longer. If adhesive strip edges start to loosen and curl up, you may trim the loose edges. Do not remove adhesive strips completely unless your health care provider tells you to do that.  Keep the dressingdry until your health care provider says that it can be removed.  After your dressing is off, check your incision area every day for signs of infection. Check for: ? Redness, swelling, or pain. ? Fluid or blood. ? Warmth. ? Pus or a bad smell. General instructions  Do not take baths, swim, or use a hot tub until your health care provider approves. Ask your  health care provider if you may take showers. You may only be allowed to take sponge baths.  Your health care provider may ask you to avoid contact sports or activities that take a lot of effort. Do not do anything that stretches or puts pressure on your incision.  You can return to your normal diet.  Keep all follow-up visits as told by your health care provider. This is important. Contact a health care provider if:  You have a fever.  You have redness, swelling, or pain in the incision area.  You have fluid or blood coming from your incision.  You have pus or a bad smell coming from your incision.  Your incision feels warm to the  touch.  Your cyst grows back. Summary  After the procedure, it is common to have soreness in the area where your cyst was removed.  Take or apply over-the-counter and prescription medicines only as told by your health care provider.  Follow instructions from your health care provider about how to take care of your incision. This information is not intended to replace advice given to you by your health care provider. Make sure you discuss any questions you have with your health care provider. Document Revised: 03/16/2018 Document Reviewed: 09/17/2017 Elsevier Patient Education  [place]. How to Use Chlorhexidine for Bathing Chlorhexidine gluconate (CHG) is a germ-killing (antiseptic) solution that is used to clean the skin. It can get rid of the bacteria that normally live on the skin and can keep them away for about 24 hours. To clean your skin with CHG, you may be given:  A CHG solution to use in the shower or as part of a sponge bath.  A prepackaged cloth that contains CHG. Cleaning your skin with CHG may help lower the risk for infection:  While you are staying in the intensive care unit of the hospital.  If you have a vascular access, such as a central line, to provide short-term or long-term access to your veins.  If you have a catheter to drain urine from your bladder.  If you are on a ventilator. A ventilator is a machine that helps you breathe by moving air in and out of your lungs.  After surgery. What are the risks? Risks of using CHG include:  A skin reaction.  Hearing loss, if CHG gets in your ears.  Eye injury, if CHG gets in your eyes and is not rinsed out.  The CHG product catching fire. Make sure that you avoid smoking and flames after applying CHG to your skin. Do not use CHG:  If you have a chlorhexidine allergy or have previously reacted to chlorhexidine.  On babies younger than 63 months of age. How to use CHG solution  Use CHG only as told  by your health care provider, and follow the instructions on the label.  Use the full amount of CHG as directed. Usually, this is one bottle. During a shower Follow these steps when using CHG solution during a shower (unless your health care provider gives you different instructions): 1. Start the shower. 2. Use your normal soap and shampoo to wash your face and hair. 3. Turn off the shower or move out of the shower stream. 4. Pour the CHG onto a clean washcloth. Do not use any type of brush or rough-edged sponge. 5. Starting at your neck, lather your body down to your toes. Make sure you follow these instructions: ? If you will be having surgery, pay  special attention to the part of your body where you will be having surgery. Scrub this area for at least 1 minute. ? Do not use CHG on your head or face. If the solution gets into your ears or eyes, rinse them well with water. ? Avoid your genital area. ? Avoid any areas of skin that have broken skin, cuts, or scrapes. ? Scrub your back and under your arms. Make sure to wash skin folds. 6. Let the lather sit on your skin for 1-2 minutes or as long as told by your health care provider. 7. Thoroughly rinse your entire body in the shower. Make sure that all body creases and crevices are rinsed well. 8. Dry off with a clean towel. Do not put any substances on your body afterward--such as powder, lotion, or perfume--unless you are told to do so by your health care provider. Only use lotions that are recommended by the manufacturer. 9. Put on clean clothes or pajamas. 10. If it is the night before your surgery, sleep in clean sheets.  During a sponge bath Follow these steps when using CHG solution during a sponge bath (unless your health care provider gives you different instructions): 1. Use your normal soap and shampoo to wash your face and hair. 2. Pour the CHG onto a clean washcloth. 3. Starting at your neck, lather your body down to your toes.  Make sure you follow these instructions: ? If you will be having surgery, pay special attention to the part of your body where you will be having surgery. Scrub this area for at least 1 minute. ? Do not use CHG on your head or face. If the solution gets into your ears or eyes, rinse them well with water. ? Avoid your genital area. ? Avoid any areas of skin that have broken skin, cuts, or scrapes. ? Scrub your back and under your arms. Make sure to wash skin folds. 4. Let the lather sit on your skin for 1-2 minutes or as long as told by your health care provider. 5. Using a different clean, wet washcloth, thoroughly rinse your entire body. Make sure that all body creases and crevices are rinsed well. 6. Dry off with a clean towel. Do not put any substances on your body afterward--such as powder, lotion, or perfume--unless you are told to do so by your health care provider. Only use lotions that are recommended by the manufacturer. 7. Put on clean clothes or pajamas. 8. If it is the night before your surgery, sleep in clean sheets. How to use CHG prepackaged cloths  Only use CHG cloths as told by your health care provider, and follow the instructions on the label.  Use the CHG cloth on clean, dry skin.  Do not use the CHG cloth on your head or face unless your health care provider tells you to.  When washing with the CHG cloth: ? Avoid your genital area. ? Avoid any areas of skin that have broken skin, cuts, or scrapes. Before surgery Follow these steps when using a CHG cloth to clean before surgery (unless your health care provider gives you different instructions): 1. Using the CHG cloth, vigorously scrub the part of your body where you will be having surgery. Scrub using a back-and-forth motion for 3 minutes. The area on your body should be completely wet with CHG when you are done scrubbing. 2. Do not rinse. Discard the cloth and let the area air-dry. Do not put any substances on the area  afterward, such as powder, lotion, or perfume. 3. Put on clean clothes or pajamas. 4. If it is the night before your surgery, sleep in clean sheets.  For general bathing Follow these steps when using CHG cloths for general bathing (unless your health care provider gives you different instructions). 1. Use a separate CHG cloth for each area of your body. Make sure you wash between any folds of skin and between your fingers and toes. Wash your body in the following order, switching to a new cloth after each step: ? The front of your neck, shoulders, and chest. ? Both of your arms, under your arms, and your hands. ? Your stomach and groin area, avoiding the genitals. ? Your right leg and foot. ? Your left leg and foot. ? The back of your neck, your back, and your buttocks. 2. Do not rinse. Discard the cloth and let the area air-dry. Do not put any substances on your body afterward--such as powder, lotion, or perfume--unless you are told to do so by your health care provider. Only use lotions that are recommended by the manufacturer. 3. Put on clean clothes or pajamas. Contact a health care provider if:  Your skin gets irritated after scrubbing.  You have questions about using your solution or cloth. Get help right away if:  Your eyes become very red or swollen.  Your eyes itch badly.  Your skin itches badly and is red or swollen.  Your hearing changes.  You have trouble seeing.  You have swelling or tingling in your mouth or throat.  You have trouble breathing.  You swallow any chlorhexidine. Summary  Chlorhexidine gluconate (CHG) is a germ-killing (antiseptic) solution that is used to clean the skin. Cleaning your skin with CHG may help to lower your risk for infection.  You may be given CHG to use for bathing. It may be in a bottle or in a prepackaged cloth to use on your skin. Carefully follow your health care provider's instructions and the instructions on the product  label.  Do not use CHG if you have a chlorhexidine allergy.  Contact your health care provider if your skin gets irritated after scrubbing. This information is not intended to replace advice given to you by your health care provider. Make sure you discuss any questions you have with your health care provider. Document Revised: 02/10/2019 Document Reviewed: 10/22/2017 Elsevier Patient Education  [place].

## 2020-11-12 NOTE — H&P (Signed)
[facility] History and Physical  Reason for Referral: Right axillary cyst  Referring Physician: Alycia Rossetti, MD     Chief Complaint     [name] (Initial Visit)      Kayla Choi is a 54 y.o. female.  HPI: Kayla Choi is a 54 yo who has ADD, COPD, DM, HLD who keloids and has noticed a foul smell in her axilla for several years. Kayla Choi reports a lump in Kayla area and says that it has never become inflamed or red but Kayla Choi has required antibiotics for it one time. Kayla Choi says that odor bothers her Kayla most because Kayla Choi feels others can smell it.  Kayla Choi has a history of fairly bad keloid in her cholecystectomy sites and prior hysterectomy incision. Kayla Choi has spoken with another physician in Kayla past about excision of Kayla keloid but this was going to require significant out of pocket money and Kayla Choi opted to not go that route.      Past Medical History:  Diagnosis Date  . Acid reflux   . ADD (attention deficit disorder)   . Anemia    iron deficinecy  . Anxiety    with social phobia  . Asthma   . Balance problem   . Chest pain    that occurs with anxiety  . COPD (chronic obstructive pulmonary disease) ([place])   . Depression   . Diabetes mellitus   . Glaucoma   . Hyperlipidemia   . Hypertension   . Normal cardiac stress test 12/2015   low risk study  . Panic attack   . Prediabetes   . Recurrent falls   . Social phobia         Past Surgical History:  Procedure Laterality Date  . ABDOMINAL HYSTERECTOMY     fibroids, both ovaries left intact  . ANKLE SURGERY    . CHOLECYSTECTOMY    . COLONOSCOPY N/A 10/18/2019   Procedure: COLONOSCOPY; Surgeon: Daneil Dolin, MD; Location: [facility]; Service: Endoscopy; Laterality: N/A; 1:00-office rescheduled to 11/10 @ 12:45pm  . COSMETIC SURGERY     elbow  . FRACTURE SURGERY     Recurrent elbow surgery  . LIPOMA EXCISION  04/30/2012   Procedure: EXCISION LIPOMA; Surgeon: Donato Heinz, MD; Location: [facility]; Service:  General; Laterality: Right; Excision soft tissue mass right thigh  . ORIF ANKLE FRACTURE Right 12/15/2017   Procedure: OPEN REDUCTION INTERNAL FIXATION (ORIF) RIGHT ANKLE FRACTURE; Surgeon: Renette Butters, MD; Location: [place]; Service: Orthopedics; Laterality: Right;  . POLYPECTOMY  10/18/2019   Procedure: POLYPECTOMY; Surgeon: Daneil Dolin, MD; Location: [facility]; Service: Endoscopy;; colon  . right elbow     reconstruction        Family History  Problem Relation Age of Onset  . Hypertension Mother   . Diabetes Father   . Heart disease Father   . Anesthesia problems Neg Hx   . Malignant hyperthermia Neg Hx   . Hypotension Neg Hx   . Pseudochol deficiency Neg Hx    Social History        Tobacco Use  . Smoking status: Never Smoker  . Smokeless tobacco: Never Used  Vaping Use  . Vaping Use: Never used  Substance Use Topics  . Alcohol use: No    Alcohol/week: 0.0 standard drinks  . Drug use: No   Medications: I have reviewed Kayla Choi's current medications.       Allergies as of 11/06/2020  Reactions   Aspirin Shortness Of Breath, Itching   Metformin And Related Anaphylaxis   Other Anaphylaxis   Green peas   Oxycodone Itching   Latex Itching, Rash   Neosporin [neomycin-bacitracin Zn-polymyx] Swelling, Rash, Other (See Comments)   skin peeling           Medication List       Accurate as of November 06, 2020 12:11 PM. If you have any questions, ask your nurse or doctor.        STOP taking these medications    benzonatate 100 MG capsule  Commonly known as: TESSALON  Stopped by: Virl Cagey, MD   ipratropium 0.02 % nebulizer solution  Commonly known as: ATROVENT  Stopped by: Virl Cagey, MD   levofloxacin 500 MG tablet  Commonly known as: LEVAQUIN  Stopped by: Virl Cagey, MD   predniSONE 20 MG tablet  Commonly known as: DELTASONE  Stopped by: Virl Cagey, MD       TAKE these medications     acetaminophen 325 MG tablet  Commonly known as: TYLENOL  Take 325 mg by mouth every 6 (six) hours as needed for moderate pain or headache.   albuterol 108 (90 Base) MCG/ACT inhaler  Commonly known as: VENTOLIN HFA  Inhale 2 puffs into Kayla lungs every 4 (four) hours as needed for wheezing or shortness of breath.   albuterol (2.5 MG/3ML) 0.083% nebulizer solution  Commonly known as: PROVENTIL  Take 3 mLs (2.5 mg total) by nebulization every 4 (four) hours as needed for wheezing or shortness of breath.   Farxiga 10 MG Tabs tablet  Generic drug: dapagliflozin propanediol  Take 10 mg by mouth daily before breakfast.   glipiZIDE 10 MG tablet  Commonly known as: GLUCOTROL  Take 1 tablet (10 mg total) by mouth 2 (two) times daily before a meal.   lisinopril-hydrochlorothiazide 20-12.5 MG tablet  Commonly known as: ZESTORETIC  Take 1 tablet by mouth daily.   loratadine 10 MG tablet  Commonly known as: CLARITIN  Take 1 tablet (10 mg total) by mouth daily.   rosuvastatin 20 MG tablet  Commonly known as: Crestor  Take 1 tablet (20 mg total) by mouth at bedtime.   Spiriva Respimat 1.25 MCG/ACT Aers  Generic drug: Tiotropium Bromide Monohydrate  Inhale 2 puffs into Kayla lungs daily.       ROS:  A comprehensive review of systems was negative except for: Integument/breast: positive for keloid issues on scars  Musculoskeletal: positive for back pain, neck pain and joint pain  Neurological: positive for dizziness and headaches  Endocrine: positive for cold intolerance, tired sluggish  Blood pressure (!) 169/111, pulse 90, temperature 98.2 F (36.8 C), temperature source Oral, resp. rate 16, height 5\' 1"  (1.549 m), weight 190 lb (86.2 kg), SpO2 94 %.  Physical Exam  Vitals reviewed.  Constitutional:  Appearance: Kayla Choi is normal weight.  HENT:  Head: Normocephalic.  Nose: Nose normal.  Mouth/Throat:  Mouth: Mucous membranes are moist.  Eyes:  Extraocular Movements: Extraocular movements  intact.  Cardiovascular:  Rate and Rhythm: Normal rate and regular rhythm.  Pulmonary:  Effort: Pulmonary effort is normal.  Breath sounds: Normal breath sounds.  Abdominal:  General: There is no distension.  Palpations: Abdomen is soft.  Tenderness: There is no abdominal tenderness.  Comments: Large keloid on hysterectomy site and gallbladder extraction site  Musculoskeletal:  General: Normal range of motion.  Cervical back: Normal range of motion.  Skin:  General: Skin is  warm.  Neurological:  General: No focal deficit present.  Mental Status: Kayla Choi is alert and oriented to person, place, and time.  Psychiatric:  Mood and Affect: Mood normal.  Behavior: Behavior normal.  Thought Content: Thought content normal.  Judgment: Judgment normal.   Results:  CLINICAL DATA: Screening recall for possible right breast  asymmetry. Kayla Choi also reports a palpable area of concern in Kayla  right axilla which has been present for approximately 1 year however  is increasing in size and is sometimes foul/sour smelling.  EXAM:  DIGITAL DIAGNOSTIC UNILATERAL RIGHT MAMMOGRAM WITH TOMO AND CAD;  ULTRASOUND RIGHT BREAST LIMITED  COMPARISON: Previous exams.  ACR Breast Density Category b: There are scattered areas of  fibroglandular density.  FINDINGS:  Additional tomograms were performed of Kayla right breast. Kayla  initially questioned possible right breast asymmetry is felt to  resolve on Kayla additional imaging with findings likely related to  overlapping fibroglandular tissue.  Mammographic images were processed with CAD.  Physical examination at site of palpable concern in Kayla right axilla  reveals a firm mass within Kayla skin.  Targeted ultrasound of Kayla outer right breast was performed. No  suspicious masses or abnormality fibroglandular tissue identified.  Targeted ultrasound of Kayla right axilla in region palpable concern  was performed demonstrating a heterogeneous cystic mass within Kayla   skin measuring 2.1 x 1.3 x 2.1 cm. Findings are most compatible with  a sebaceous cyst.  IMPRESSION:  1. Sebaceous cyst at site of palpable concern in Kayla right axilla.  2. No findings of malignancy in Kayla right breast.  RECOMMENDATION:  1. Consider surgical/dermatologic consultation for excision of Kayla  sebaceous cyst in Kayla right axilla given progressive enlargement per  Kayla Choi and tenderness.  2. Screening mammogram in one year.(Code:SM-B-01Y)  I have discussed Kayla findings and recommendations with Kayla Choi.  If applicable, a reminder letter will be sent to Kayla Choi  regarding Kayla next appointment.  BI-RADS CATEGORY 2: Benign.  Electronically Signed  By: Everlean Alstrom M.D.  On: 10/23/2020 08:47  Assessment & Plan:  Carrol Hougland is a 54 y.o. female with a cyst in her right axilla that is superficial and palpable. Kayla Choi can smell drainage and it has been infected once requiring antibiotics. Kayla Choi wants it excised but realizes that Kayla Choi may keloid. We have discussed Kayla silicone gel sheets and keeping area moist to prevent keloid.  -Discussed risk of bleeding, infection, need for packing, risk of keloid formation, risk of recurrence. Kayla Choi wants to get this done in Kayla operating room with sedation.  -Discussed preop COVID testing  Will look into getting silicone sheets for her dressing, will see if Kayla OR has any  Kayla Choi.  Virl Cagey  11/06/2020, 12:11 PM

## 2020-11-14 ENCOUNTER — Encounter (HOSPITAL_COMMUNITY)
Admission: RE | Admit: 2020-11-14 | Discharge: 2020-11-14 | Disposition: A | Discharge status: Home / Self Care | Payer: Medicaid Other | Source: Ambulatory Visit | Attending: General Surgery | Admitting: General Surgery

## 2020-11-19 ENCOUNTER — Other Ambulatory Visit (HOSPITAL_COMMUNITY): Payer: Medicaid Other

## 2020-12-14 NOTE — Patient Instructions (Addendum)
Kayla Choi  12/14/2020     @PREFPERIOPPHARMACY @   Your procedure is scheduled on  12/19/2020  Report to [facility] at  [place].  Call this number if you have problems the morning of surgery:  [phone]   Remember:  Do not eat or drink after midnight.                        Take these medicines the morning of surgery with A SIP OF WATER  claritan. Use your nebulizer and your inhalers before you come.    Do not wear jewelry, make-up or nail polish.  Do not wear lotions, powders, or perfumes, or deodorant. Please brush your teeth.  Do not shave 48 hours prior to surgery.  Men may shave face and neck.  Do not bring valuables to the hospital.  [facility] is not responsible for any belongings or valuables.  Contacts, dentures or bridgework may not be worn into surgery.  Leave your suitcase in the car.  After surgery it may be brought to your room.  For patients admitted to the hospital, discharge time will be determined by your treatment team.  Patients discharged the day of surgery will not be allowed to drive home.   Name and phone number of your driver:   family Special instructions:  DO NOT smoke the morning of your procedure.  Please read over the following fact sheets that you were given. Anesthesia Post-op Instructions and Care and Recovery After Surgery       Epidermal Cyst Removal, Care After This sheet gives you information about how to care for yourself after your procedure. Your health care provider may also give you more specific instructions. If you have problems or questions, contact your health care provider. What can I expect after the procedure? After the procedure, it is common to have:  Soreness in the area where your cyst was removed.  Tightness or itchiness from the stitches (sutures) in your skin. Follow these instructions at home: Medicines  Take over-the-counter and prescription medicines only as told by your health care  provider.  If you were prescribed an antibiotic medicine or ointment, take or apply it as told by your health care provider. Do not stop using the antibiotic even if you start to feel better. Incision care   Follow instructions from your health care provider about how to take care of your incision. Make sure you: ? Wash your hands with soap and water before you change your bandage (dressing). If soap and water are not available, use hand sanitizer. ? Change your dressing as told by your health care provider. ? Leave sutures, skin glue, or adhesive strips in place. These skin closures may need to stay in place for 1-2 weeks or longer. If adhesive strip edges start to loosen and curl up, you may trim the loose edges. Do not remove adhesive strips completely unless your health care provider tells you to do that.  Keep the dressingdry until your health care provider says that it can be removed.  After your dressing is off, check your incision area every day for signs of infection. Check for: ? Redness, swelling, or pain. ? Fluid or blood. ? Warmth. ? Pus or a bad smell. General instructions  Do not take baths, swim, or use a hot tub until your health care provider approves. Ask your health care provider if you may take showers. You may  only be allowed to take sponge baths.  Your health care provider may ask you to avoid contact sports or activities that take a lot of effort. Do not do anything that stretches or puts pressure on your incision.  You can return to your normal diet.  Keep all follow-up visits as told by your health care provider. This is important. Contact a health care provider if:  You have a fever.  You have redness, swelling, or pain in the incision area.  You have fluid or blood coming from your incision.  You have pus or a bad smell coming from your incision.  Your incision feels warm to the touch.  Your cyst grows back. Summary  After the procedure, it is  common to have soreness in the area where your cyst was removed.  Take or apply over-the-counter and prescription medicines only as told by your health care provider.  Follow instructions from your health care provider about how to take care of your incision. This information is not intended to replace advice given to you by your health care provider. Make sure you discuss any questions you have with your health care provider. Document Revised: 03/16/2018 Document Reviewed: 09/17/2017 Elsevier Patient Education  2020 [place] After These instructions provide you with information about caring for yourself after your procedure. Your health care provider may also give you more specific instructions. Your treatment has been planned according to current medical practices, but problems sometimes occur. Call your health care provider if you have any problems or questions after your procedure. What can I expect after the procedure? After your procedure, you may:  Feel sleepy for several hours.  Feel clumsy and have poor balance for several hours.  Feel forgetful about what happened after the procedure.  Have poor judgment for several hours.  Feel nauseous or vomit.  Have a sore throat if you had a breathing tube during the procedure. Follow these instructions at home: For at least 24 hours after the procedure:      Have a responsible adult stay with you. It is important to have someone help care for you until you are awake and alert.  Rest as needed.  Do not: ? Participate in activities in which you could fall or become injured. ? Drive. ? Use heavy machinery. ? Drink alcohol. ? Take sleeping pills or medicines that cause drowsiness. ? Make important decisions or sign legal documents. ? Take care of children on your own. Eating and drinking  Follow the diet that is recommended by your health care provider.  If you vomit, drink water,  juice, or soup when you can drink without vomiting.  Make sure you have little or no nausea before eating solid foods. General instructions  Take over-the-counter and prescription medicines only as told by your health care provider.  If you have sleep apnea, surgery and certain medicines can increase your risk for breathing problems. Follow instructions from your health care provider about wearing your sleep device: ? Anytime you are sleeping, including during daytime naps. ? While taking prescription pain medicines, sleeping medicines, or medicines that make you drowsy.  If you smoke, do not smoke without supervision.  Keep all follow-up visits as told by your health care provider. This is important. Contact a health care provider if:  You keep feeling nauseous or you keep vomiting.  You feel light-headed.  You develop a rash.  You have a fever. Get help right away if:  You have trouble breathing. Summary  For several hours after your procedure, you may feel sleepy and have poor judgment.  Have a responsible adult stay with you for at least 24 hours or until you are awake and alert. This information is not intended to replace advice given to you by your health care provider. Make sure you discuss any questions you have with your health care provider. Document Revised: 02/22/2018 Document Reviewed: 03/16/2016 Elsevier Patient Education  2020 [employer]. How to Use Chlorhexidine for Bathing Chlorhexidine gluconate (CHG) is a germ-killing (antiseptic) solution that is used to clean the skin. It can get rid of the bacteria that normally live on the skin and can keep them away for about 24 hours. To clean your skin with CHG, you may be given:  A CHG solution to use in the shower or as part of a sponge bath.  A prepackaged cloth that contains CHG. Cleaning your skin with CHG may help lower the risk for infection:  While you are staying in the intensive care unit of the  hospital.  If you have a vascular access, such as a central line, to provide short-term or long-term access to your veins.  If you have a catheter to drain urine from your bladder.  If you are on a ventilator. A ventilator is a machine that helps you breathe by moving air in and out of your lungs.  After surgery. What are the risks? Risks of using CHG include:  A skin reaction.  Hearing loss, if CHG gets in your ears.  Eye injury, if CHG gets in your eyes and is not rinsed out.  The CHG product catching fire. Make sure that you avoid smoking and flames after applying CHG to your skin. Do not use CHG:  If you have a chlorhexidine allergy or have previously reacted to chlorhexidine.  On babies younger than 19 months of age. How to use CHG solution  Use CHG only as told by your health care provider, and follow the instructions on the label.  Use the full amount of CHG as directed. Usually, this is one bottle. During a shower Follow these steps when using CHG solution during a shower (unless your health care provider gives you different instructions): 1. Start the shower. 2. Use your normal soap and shampoo to wash your face and hair. 3. Turn off the shower or move out of the shower stream. 4. Pour the CHG onto a clean washcloth. Do not use any type of brush or rough-edged sponge. 5. Starting at your neck, lather your body down to your toes. Make sure you follow these instructions: ? If you will be having surgery, pay special attention to the part of your body where you will be having surgery. Scrub this area for at least 1 minute. ? Do not use CHG on your head or face. If the solution gets into your ears or eyes, rinse them well with water. ? Avoid your genital area. ? Avoid any areas of skin that have broken skin, cuts, or scrapes. ? Scrub your back and under your arms. Make sure to wash skin folds. 6. Let the lather sit on your skin for 1-2 minutes or as long as told by your  health care provider. 7. Thoroughly rinse your entire body in the shower. Make sure that all body creases and crevices are rinsed well. 8. Dry off with a clean towel. Do not put any substances on your body afterward--such as powder, lotion, or perfume--unless  you are told to do so by your health care provider. Only use lotions that are recommended by the manufacturer. 9. Put on clean clothes or pajamas. 10. If it is the night before your surgery, sleep in clean sheets.  During a sponge bath Follow these steps when using CHG solution during a sponge bath (unless your health care provider gives you different instructions): 1. Use your normal soap and shampoo to wash your face and hair. 2. Pour the CHG onto a clean washcloth. 3. Starting at your neck, lather your body down to your toes. Make sure you follow these instructions: ? If you will be having surgery, pay special attention to the part of your body where you will be having surgery. Scrub this area for at least 1 minute. ? Do not use CHG on your head or face. If the solution gets into your ears or eyes, rinse them well with water. ? Avoid your genital area. ? Avoid any areas of skin that have broken skin, cuts, or scrapes. ? Scrub your back and under your arms. Make sure to wash skin folds. 4. Let the lather sit on your skin for 1-2 minutes or as long as told by your health care provider. 5. Using a different clean, wet washcloth, thoroughly rinse your entire body. Make sure that all body creases and crevices are rinsed well. 6. Dry off with a clean towel. Do not put any substances on your body afterward--such as powder, lotion, or perfume--unless you are told to do so by your health care provider. Only use lotions that are recommended by the manufacturer. 7. Put on clean clothes or pajamas. 8. If it is the night before your surgery, sleep in clean sheets. How to use CHG prepackaged cloths  Only use CHG cloths as told by your health care  provider, and follow the instructions on the label.  Use the CHG cloth on clean, dry skin.  Do not use the CHG cloth on your head or face unless your health care provider tells you to.  When washing with the CHG cloth: ? Avoid your genital area. ? Avoid any areas of skin that have broken skin, cuts, or scrapes. Before surgery Follow these steps when using a CHG cloth to clean before surgery (unless your health care provider gives you different instructions): 1. Using the CHG cloth, vigorously scrub the part of your body where you will be having surgery. Scrub using a back-and-forth motion for 3 minutes. The area on your body should be completely wet with CHG when you are done scrubbing. 2. Do not rinse. Discard the cloth and let the area air-dry. Do not put any substances on the area afterward, such as powder, lotion, or perfume. 3. Put on clean clothes or pajamas. 4. If it is the night before your surgery, sleep in clean sheets.  For general bathing Follow these steps when using CHG cloths for general bathing (unless your health care provider gives you different instructions). 1. Use a separate CHG cloth for each area of your body. Make sure you wash between any folds of skin and between your fingers and toes. Wash your body in the following order, switching to a new cloth after each step: ? The front of your neck, shoulders, and chest. ? Both of your arms, under your arms, and your hands. ? Your stomach and groin area, avoiding the genitals. ? Your right leg and foot. ? Your left leg and foot. ? The back of your neck, your  back, and your buttocks. 2. Do not rinse. Discard the cloth and let the area air-dry. Do not put any substances on your body afterward--such as powder, lotion, or perfume--unless you are told to do so by your health care provider. Only use lotions that are recommended by the manufacturer. 3. Put on clean clothes or pajamas. Contact a health care provider if:  Your  skin gets irritated after scrubbing.  You have questions about using your solution or cloth. Get help right away if:  Your eyes become very red or swollen.  Your eyes itch badly.  Your skin itches badly and is red or swollen.  Your hearing changes.  You have trouble seeing.  You have swelling or tingling in your mouth or throat.  You have trouble breathing.  You swallow any chlorhexidine. Summary  Chlorhexidine gluconate (CHG) is a germ-killing (antiseptic) solution that is used to clean the skin. Cleaning your skin with CHG may help to lower your risk for infection.  You may be given CHG to use for bathing. It may be in a bottle or in a prepackaged cloth to use on your skin. Carefully follow your health care provider's instructions and the instructions on the product label.  Do not use CHG if you have a chlorhexidine allergy.  Contact your health care provider if your skin gets irritated after scrubbing. This information is not intended to replace advice given to you by your health care provider. Make sure you discuss any questions you have with your health care provider. Document Revised: 02/10/2019 Document Reviewed: 10/22/2017 Elsevier Patient Education  2020 [employer].

## 2020-12-17 ENCOUNTER — Other Ambulatory Visit (HOSPITAL_COMMUNITY)
Admission: RE | Admit: 2020-12-17 | Discharge: 2020-12-17 | Disposition: A | Discharge status: Home / Self Care | Payer: Medicaid Other | Source: Ambulatory Visit | Attending: General Surgery | Admitting: General Surgery

## 2020-12-17 ENCOUNTER — Encounter (HOSPITAL_COMMUNITY)
Admission: RE | Admit: 2020-12-17 | Discharge: 2020-12-17 | Disposition: A | Discharge status: Home / Self Care | Payer: Medicaid Other | Source: Ambulatory Visit | Attending: General Surgery | Admitting: General Surgery

## 2020-12-17 ENCOUNTER — Other Ambulatory Visit: Payer: Self-pay

## 2020-12-17 DIAGNOSIS — Z20822 Contact with and (suspected) exposure to covid-19: Secondary | ICD-10-CM | POA: Insufficient documentation

## 2020-12-17 DIAGNOSIS — Z01812 Encounter for preprocedural laboratory examination: Secondary | ICD-10-CM | POA: Diagnosis not present

## 2020-12-17 LAB — SARS CORONAVIRUS 2 (TAT 6-24 HRS): SARS Coronavirus 2: NEGATIVE

## 2020-12-19 ENCOUNTER — Ambulatory Visit (HOSPITAL_COMMUNITY): Payer: Medicaid Other | Admitting: Anesthesiology

## 2020-12-19 ENCOUNTER — Encounter (HOSPITAL_COMMUNITY): Payer: Self-pay | Admitting: General Surgery

## 2020-12-19 ENCOUNTER — Encounter (HOSPITAL_COMMUNITY)
Admission: RE | Disposition: A | Discharge status: Home / Self Care | Payer: Self-pay | Source: Home / Self Care | Attending: General Surgery

## 2020-12-19 ENCOUNTER — Ambulatory Visit (HOSPITAL_COMMUNITY)
Admission: RE | Admit: 2020-12-19 | Discharge: 2020-12-19 | Disposition: A | Discharge status: Home / Self Care | Payer: Medicaid Other | Attending: General Surgery | Admitting: General Surgery

## 2020-12-19 DIAGNOSIS — Z79899 Other long term (current) drug therapy: Secondary | ICD-10-CM | POA: Diagnosis not present

## 2020-12-19 DIAGNOSIS — Z87898 Personal history of other specified conditions: Secondary | ICD-10-CM | POA: Insufficient documentation

## 2020-12-19 DIAGNOSIS — Z881 Allergy status to other antibiotic agents status: Secondary | ICD-10-CM | POA: Insufficient documentation

## 2020-12-19 DIAGNOSIS — L720 Epidermal cyst: Secondary | ICD-10-CM | POA: Insufficient documentation

## 2020-12-19 DIAGNOSIS — Z885 Allergy status to narcotic agent status: Secondary | ICD-10-CM | POA: Diagnosis not present

## 2020-12-19 DIAGNOSIS — L723 Sebaceous cyst: Secondary | ICD-10-CM | POA: Diagnosis not present

## 2020-12-19 DIAGNOSIS — F988 Other specified behavioral and emotional disorders with onset usually occurring in childhood and adolescence: Secondary | ICD-10-CM | POA: Diagnosis not present

## 2020-12-19 DIAGNOSIS — Z886 Allergy status to analgesic agent status: Secondary | ICD-10-CM | POA: Insufficient documentation

## 2020-12-19 DIAGNOSIS — E119 Type 2 diabetes mellitus without complications: Secondary | ICD-10-CM | POA: Diagnosis not present

## 2020-12-19 DIAGNOSIS — E785 Hyperlipidemia, unspecified: Secondary | ICD-10-CM | POA: Diagnosis not present

## 2020-12-19 DIAGNOSIS — Z7984 Long term (current) use of oral hypoglycemic drugs: Secondary | ICD-10-CM | POA: Diagnosis not present

## 2020-12-19 DIAGNOSIS — Z7952 Long term (current) use of systemic steroids: Secondary | ICD-10-CM | POA: Diagnosis not present

## 2020-12-19 DIAGNOSIS — I10 Essential (primary) hypertension: Secondary | ICD-10-CM | POA: Diagnosis not present

## 2020-12-19 DIAGNOSIS — Z888 Allergy status to other drugs, medicaments and biological substances status: Secondary | ICD-10-CM | POA: Diagnosis not present

## 2020-12-19 DIAGNOSIS — Z87892 Personal history of anaphylaxis: Secondary | ICD-10-CM | POA: Insufficient documentation

## 2020-12-19 DIAGNOSIS — Z91040 Latex allergy status: Secondary | ICD-10-CM | POA: Insufficient documentation

## 2020-12-19 DIAGNOSIS — J449 Chronic obstructive pulmonary disease, unspecified: Secondary | ICD-10-CM | POA: Diagnosis not present

## 2020-12-19 HISTORY — PX: MASS EXCISION: SHX2000

## 2020-12-19 LAB — GLUCOSE, CAPILLARY
Glucose-Capillary: 226 mg/dL — ABNORMAL HIGH (ref 70–99)
Glucose-Capillary: 205 mg/dL — ABNORMAL HIGH (ref 70–99)

## 2020-12-19 SURGERY — EXCISION MASS
Anesthesia: General | Laterality: Right

## 2020-12-19 MED ORDER — ORAL CARE MOUTH RINSE: 15.0000 mL | Freq: Once | OROMUCOSAL | Status: AC

## 2020-12-19 MED ORDER — 0.9 % SODIUM CHLORIDE (POUR BTL) OPTIME
TOPICAL | Status: DC | PRN
  Administered 2020-12-19: 1000 mL

## 2020-12-19 MED ORDER — MIDAZOLAM HCL 2 MG/2ML IJ SOLN
INTRAMUSCULAR | Status: DC | PRN
  Administered 2020-12-19: 2 mg via INTRAVENOUS

## 2020-12-19 MED ORDER — CHLORHEXIDINE GLUCONATE CLOTH 2 % EX PADS: 6.0000 | MEDICATED_PAD | Freq: Once | CUTANEOUS | Status: DC

## 2020-12-19 MED ORDER — LACTATED RINGERS IV SOLN
Freq: Once | INTRAVENOUS | Status: AC
  Administered 2020-12-19: 1000 mL via INTRAVENOUS

## 2020-12-19 MED ORDER — PROMETHAZINE HCL 25 MG/ML IJ SOLN: 6.2500 mg | INTRAMUSCULAR | Status: DC | PRN

## 2020-12-19 MED ORDER — MEPERIDINE HCL 50 MG/ML IJ SOLN: 6.2500 mg | INTRAMUSCULAR | Status: DC | PRN

## 2020-12-19 MED ORDER — BUPIVACAINE HCL (PF) 0.5 % IJ SOLN
INTRAMUSCULAR | Status: AC
  Filled 2020-12-19: qty 30

## 2020-12-19 MED ORDER — CHLORHEXIDINE GLUCONATE 0.12 % MT SOLN
15.0000 mL | Freq: Once | OROMUCOSAL | Status: AC
  Administered 2020-12-19: 15 mL via OROMUCOSAL

## 2020-12-19 MED ORDER — CEFAZOLIN SODIUM-DEXTROSE 2-4 GM/100ML-% IV SOLN
INTRAVENOUS | Status: AC
  Filled 2020-12-19: qty 100

## 2020-12-19 MED ORDER — CHLORHEXIDINE GLUCONATE 0.12 % MT SOLN
OROMUCOSAL | Status: AC
  Filled 2020-12-19: qty 15

## 2020-12-19 MED ORDER — LACTATED RINGERS IV SOLN: INTRAVENOUS | Status: DC | PRN

## 2020-12-19 MED ORDER — TRAMADOL HCL 50 MG PO TABS: 50.0000 mg | ORAL_TABLET | Freq: Four times a day (QID) | ORAL | 0 refills | Status: AC | PRN

## 2020-12-19 MED ORDER — FENTANYL CITRATE (PF) 250 MCG/5ML IJ SOLN
INTRAMUSCULAR | Status: DC | PRN
  Administered 2020-12-19 (×2): 25 ug via INTRAVENOUS

## 2020-12-19 MED ORDER — FENTANYL CITRATE (PF) 100 MCG/2ML IJ SOLN: 25.0000 ug | INTRAMUSCULAR | Status: DC | PRN

## 2020-12-19 MED ORDER — BUPIVACAINE HCL (PF) 0.5 % IJ SOLN
INTRAMUSCULAR | Status: DC | PRN
  Administered 2020-12-19: 10 mL

## 2020-12-19 MED ORDER — METOCLOPRAMIDE HCL 5 MG/ML IJ SOLN
INTRAMUSCULAR | Status: DC | PRN
  Administered 2020-12-19: 10 mg via INTRAVENOUS

## 2020-12-19 MED ORDER — PROPOFOL 10 MG/ML IV BOLUS
INTRAVENOUS | Status: DC | PRN
  Administered 2020-12-19: 200 mg via INTRAVENOUS

## 2020-12-19 MED ORDER — METOCLOPRAMIDE HCL 5 MG/ML IJ SOLN
INTRAMUSCULAR | Status: AC
  Filled 2020-12-19: qty 2

## 2020-12-19 MED ORDER — CEFAZOLIN SODIUM-DEXTROSE 2-4 GM/100ML-% IV SOLN
2.0000 g | INTRAVENOUS | Status: AC
  Administered 2020-12-19: 2 g via INTRAVENOUS

## 2020-12-19 MED ORDER — PHENYLEPHRINE HCL (PRESSORS) 10 MG/ML IV SOLN
INTRAVENOUS | Status: DC | PRN
  Administered 2020-12-19 (×6): 80 ug via INTRAVENOUS

## 2020-12-19 MED ORDER — FENTANYL CITRATE (PF) 100 MCG/2ML IJ SOLN
INTRAMUSCULAR | Status: AC
  Filled 2020-12-19: qty 2

## 2020-12-19 MED ORDER — MIDAZOLAM HCL 2 MG/2ML IJ SOLN
INTRAMUSCULAR | Status: AC
  Filled 2020-12-19: qty 2

## 2020-12-19 MED ORDER — ONDANSETRON HCL 4 MG/2ML IJ SOLN
INTRAMUSCULAR | Status: DC | PRN
  Administered 2020-12-19: 4 mg via INTRAVENOUS

## 2020-12-19 MED ORDER — LIDOCAINE HCL (CARDIAC) PF 100 MG/5ML IV SOSY
PREFILLED_SYRINGE | INTRAVENOUS | Status: DC | PRN
  Administered 2020-12-19: 80 mg via INTRAVENOUS

## 2020-12-19 MED ORDER — PHENYLEPHRINE 40 MCG/ML (10ML) SYRINGE FOR IV PUSH (FOR BLOOD PRESSURE SUPPORT)
PREFILLED_SYRINGE | INTRAVENOUS | Status: AC
  Filled 2020-12-19: qty 10

## 2020-12-19 MED ORDER — LIDOCAINE HCL (PF) 2 % IJ SOLN
INTRAMUSCULAR | Status: AC
  Filled 2020-12-19: qty 5

## 2020-12-19 MED ORDER — ONDANSETRON HCL 4 MG PO TABS: 4.0000 mg | ORAL_TABLET | Freq: Three times a day (TID) | ORAL | 1 refills | Status: AC | PRN

## 2020-12-19 MED ORDER — PROPOFOL 10 MG/ML IV BOLUS
INTRAVENOUS | Status: AC
  Filled 2020-12-19: qty 40

## 2020-12-19 MED ORDER — ONDANSETRON HCL 4 MG/2ML IJ SOLN
INTRAMUSCULAR | Status: AC
  Filled 2020-12-19: qty 2

## 2020-12-19 SURGICAL SUPPLY — 36 items
ADH SKN CLS APL DERMABOND .7 (GAUZE/BANDAGES/DRESSINGS) ×1
APL PRP STRL LF ISPRP CHG 10.5 (MISCELLANEOUS) ×1
APPLICATOR CHLORAPREP 10.5 ORG (MISCELLANEOUS) ×2 IMPLANT
CLOTH BEACON ORANGE TIMEOUT ST (SAFETY) ×2 IMPLANT
COVER LIGHT HANDLE STERIS (MISCELLANEOUS) ×4 IMPLANT
COVER WAND RF STERILE (DRAPES) ×2 IMPLANT
DECANTER SPIKE VIAL GLASS SM (MISCELLANEOUS) ×2 IMPLANT
DERMABOND ADVANCED (GAUZE/BANDAGES/DRESSINGS) ×1
DERMABOND ADVANCED .7 DNX12 (GAUZE/BANDAGES/DRESSINGS) IMPLANT
DRAPE EENT ADH APERT 31X51 STR (DRAPES) IMPLANT
DRSG TEGADERM 4X4.75 (GAUZE/BANDAGES/DRESSINGS) ×5 IMPLANT
ELECT NDL TIP 2.8 STRL (NEEDLE) IMPLANT
ELECT NEEDLE TIP 2.8 STRL (NEEDLE) IMPLANT
ELECT REM PT RETURN 9FT ADLT (ELECTROSURGICAL) ×2
ELECTRODE REM PT RTRN 9FT ADLT (ELECTROSURGICAL) ×1 IMPLANT
GAUZE SPONGE 4X4 12PLY STRL (GAUZE/BANDAGES/DRESSINGS) ×1 IMPLANT
GLOVE BIO SURGEON STRL SZ 6.5 (GLOVE) ×2 IMPLANT
GLOVE BIOGEL M 7.0 STRL (GLOVE) ×1 IMPLANT
GLOVE BIOGEL PI IND STRL 7.0 (GLOVE) ×1 IMPLANT
GLOVE BIOGEL PI INDICATOR 7.0 (GLOVE) ×3
GOWN STRL REUS W/TWL LRG LVL3 (GOWN DISPOSABLE) ×4 IMPLANT
KIT TURNOVER KIT A (KITS) ×2 IMPLANT
MANIFOLD NEPTUNE II (INSTRUMENTS) ×2 IMPLANT
NDL HYPO 25X1 1.5 SAFETY (NEEDLE) ×1 IMPLANT
NEEDLE HYPO 25X1 1.5 SAFETY (NEEDLE) ×2 IMPLANT
NS IRRIG 1000ML POUR BTL (IV SOLUTION) ×2 IMPLANT
PACK MINOR (CUSTOM PROCEDURE TRAY) IMPLANT
PAD ARMBOARD 7.5X6 YLW CONV (MISCELLANEOUS) ×2 IMPLANT
SET BASIN LINEN APH (SET/KITS/TRAYS/PACK) ×2 IMPLANT
SHEET SILICONE 2X3 0.02 REINF (MISCELLANEOUS) ×2 IMPLANT
SUT ETHILON 3 0 FSL (SUTURE) IMPLANT
SUT MNCRL AB 4-0 PS2 18 (SUTURE) ×2 IMPLANT
SUT PROLENE 4 0 PS 2 18 (SUTURE) IMPLANT
SUT VIC AB 3-0 SH 27 (SUTURE)
SUT VIC AB 3-0 SH 27X BRD (SUTURE) IMPLANT
SYR CONTROL 10ML LL (SYRINGE) ×2 IMPLANT

## 2020-12-19 NOTE — Anesthesia Postprocedure Evaluation (Signed)
Anesthesia Post Note  Patient: Kayla Choi  Procedure(s) Performed: EXCISION CYST; 2 CM; AXILLA (Right )  Patient location during evaluation: PACU Anesthesia Type: General Level of consciousness: awake and oriented Pain management: satisfactory to patient Vital Signs Assessment: post-procedure vital signs reviewed and stable Respiratory status: spontaneous breathing and respiratory function stable Cardiovascular status: blood pressure returned to baseline and stable Postop Assessment: no apparent nausea or vomiting Anesthetic complications: no   No complications documented.   Last Vitals:  Vitals:   12/19/20 0824 12/19/20 0830  BP: (!) 141/84 (!) 149/96  Pulse: (!) 106 92  Resp: 17 13  Temp:    SpO2: 100% 100%    Last Pain:  Vitals:   12/19/20 0646  TempSrc: Oral  PainSc: 0-No pain                 Karna Dupes

## 2020-12-19 NOTE — H&P (Signed)
[facility] History and Physical    Kayla Choi is a 55 y.o. female.  HPI: [name] is a 55 yo with COPD, ADD, DM, who keloids and has a right axilla sebaceous cyst. She has had her mammograms and imaging and this is documented as a sebaceous cyst. She has had an infection in the area in the past and required antibiotics.  She does have bad keloid on her prior scars especially her cholecystectomy scars.   Past Medical History:  Diagnosis Date  . Acid reflux   . ADD (attention deficit disorder)   . Anemia    iron deficinecy  . Anxiety    with social phobia  . Asthma   . Balance problem   . Chest pain    that occurs with anxiety  . COPD (chronic obstructive pulmonary disease) ([place])   . Depression   . Diabetes mellitus   . Glaucoma   . Hyperlipidemia   . Hypertension   . Normal cardiac stress test 12/2015   low risk study  . Panic attack   . Prediabetes   . Recurrent falls   . Social phobia     Past Surgical History:  Procedure Laterality Date  . ABDOMINAL HYSTERECTOMY     fibroids, both ovaries left intact  . ANKLE SURGERY    . CHOLECYSTECTOMY    . COLONOSCOPY N/A 10/18/2019   Procedure: COLONOSCOPY;  Surgeon: Daneil Dolin, MD;  Location: [facility];  Service: Endoscopy;  Laterality: N/A;  1:00-office rescheduled to 11/10 @ 12:45pm  . COSMETIC SURGERY     elbow  . FRACTURE SURGERY     Recurrent elbow surgery  . LIPOMA EXCISION  04/30/2012   Procedure: EXCISION LIPOMA;  Surgeon: Donato Heinz, MD;  Location: [facility] OR[facility];  Service: General;  Laterality: Right;  Excision soft tissue mass right thigh  . ORIF ANKLE FRACTURE Right 12/15/2017   Procedure: OPEN REDUCTION INTERNAL FIXATION (ORIF) RIGHT ANKLE FRACTURE;  Surgeon: Renette Butters, MD;  Location: [place];  Service: Orthopedics;  Laterality: Right;  . POLYPECTOMY  10/18/2019   Procedure: POLYPECTOMY;  Surgeon: Daneil Dolin, MD;  Location: [facility];  Service:  Endoscopy;;  colon  . right elbow     reconstruction    Family History  Problem Relation Age of Onset  . Hypertension Mother   . Diabetes Father   . Heart disease Father   . Anesthesia problems Neg Hx   . Malignant hyperthermia Neg Hx   . Hypotension Neg Hx   . Pseudochol deficiency Neg Hx     Social History   Tobacco Use  . Smoking status: Never Smoker  . Smokeless tobacco: Never Used  Vaping Use  . Vaping Use: Never used  Substance Use Topics  . Alcohol use: No    Alcohol/week: 0.0 standard drinks  . Drug use: No    Medications: I have reviewed the patient's current medications. Medications Prior to Admission  Medication Sig Dispense Refill  . acetaminophen (TYLENOL) 325 MG tablet Take 325 mg by mouth every 6 (six) hours as needed for moderate pain or headache.     . albuterol (PROVENTIL) (2.5 MG/3ML) 0.083% nebulizer solution Take 3 mLs (2.5 mg total) by nebulization every 4 (four) hours as needed for wheezing or shortness of breath. 75 mL 2  . albuterol (VENTOLIN HFA) 108 (90 Base) MCG/ACT inhaler Inhale 2 puffs into the lungs every 4 (four) hours as needed for wheezing or shortness of  breath. 18 g 2  . CRANBERRY PO Take 2 tablets by mouth every other day.    . dapagliflozin propanediol (FARXIGA) 10 MG TABS tablet Take 10 mg by mouth daily before breakfast. 30 tablet 3  . glipiZIDE (GLUCOTROL) 10 MG tablet Take 1 tablet (10 mg total) by mouth 2 (two) times daily before a meal. 180 tablet 1  . lisinopril-hydrochlorothiazide (PRINZIDE,ZESTORETIC) 20-12.5 MG tablet Take 1 tablet by mouth daily. 30 tablet 3  . loratadine (CLARITIN) 10 MG tablet Take 1 tablet (10 mg total) by mouth daily. 30 tablet 11  . Phenyleph-Doxylamine-DM-APAP (ALKA SELTZER PLUS PO) Take 2 tablets by mouth daily as needed (congestion).    . rosuvastatin (CRESTOR) 20 MG tablet Take 1 tablet (20 mg total) by mouth at bedtime. 90 tablet 3  . Tiotropium Bromide Monohydrate (SPIRIVA RESPIMAT) 1.25 MCG/ACT  AERS Inhale 2 puffs into the lungs daily. 4 g 0   Current Facility-Administered Medications  Medication Dose Route Frequency Provider Last Rate Last Admin  . ceFAZolin (ANCEF) IVPB 2g/100 mL premix  2 g Intravenous On Call to OR Virl Cagey, MD      . Chlorhexidine Gluconate Cloth 2 % PADS 6 each  6 each Topical Once Virl Cagey, MD       And  . Chlorhexidine Gluconate Cloth 2 % PADS 6 each  6 each Topical Once Virl Cagey, MD       Allergies  Allergen Reactions  . Aspirin Shortness Of Breath and Itching  . Metformin And Related Anaphylaxis  . Other Anaphylaxis    Green peas  . Oxycodone Itching  . Latex Itching and Rash  . Neosporin [Neomycin-Bacitracin Zn-Polymyx] Swelling, Rash and Other (See Comments)    skin peeling     ROS:  A comprehensive review of systems was negative except for: right axillary cyst  Blood pressure (!) 183/106, temperature 97.9 F (36.6 C), temperature source Oral, resp. rate 17, height 5\' 1"  (1.549 m), weight 85.7 kg, SpO2 98 %. Physical Exam Vitals reviewed.  HENT:     Head: Normocephalic.     Nose: Nose normal.  Eyes:     Extraocular Movements: Extraocular movements intact.  Cardiovascular:     Rate and Rhythm: Normal rate.  Pulmonary:     Effort: Pulmonary effort is normal.  Abdominal:     General: There is no distension.     Palpations: Abdomen is soft.     Tenderness: There is no abdominal tenderness.  Musculoskeletal:        General: Normal range of motion.     Comments: Right axillary cyst marked, no erythema   Skin:    General: Skin is warm.  Neurological:     General: No focal deficit present.     Mental Status: She is alert and oriented to person, place, and time.  Psychiatric:        Mood and Affect: Mood normal.        Behavior: Behavior normal.     Results: Results for orders placed or performed during the hospital encounter of 12/19/20 (from the past 48 hour(s))  Glucose, capillary     Status:  Abnormal   Collection Time: 12/19/20  6:48 AM  Result Value Ref Range   Glucose-Capillary 226 (H) 70 - 99 mg/dL    Comment: Glucose reference range applies only to samples taken after fasting for at least 8 hours.    EXAM:  DIGITAL DIAGNOSTIC UNILATERAL RIGHT MAMMOGRAM WITH TOMO AND CAD;  ULTRASOUND RIGHT BREAST LIMITED  COMPARISON: Previous exams.  ACR Breast Density Category b: There are scattered areas of  fibroglandular density.  FINDINGS:  Additional tomograms were performed of the right breast. The  initially questioned possible right breast asymmetry is felt to  resolve on the additional imaging with findings likely related to  overlapping fibroglandular tissue.  Mammographic images were processed with CAD.  Physical examination at site of palpable concern in the right axilla  reveals a firm mass within the skin.  Targeted ultrasound of the outer right breast was performed. No  suspicious masses or abnormality fibroglandular tissue identified.  Targeted ultrasound of the right axilla in region palpable concern  was performed demonstrating a heterogeneous cystic mass within the  skin measuring 2.1 x 1.3 x 2.1 cm. Findings are most compatible with  a sebaceous cyst.  IMPRESSION:  1. Sebaceous cyst at site of palpable concern in the right axilla.  2. No findings of malignancy in the right breast.  RECOMMENDATION:  1. Consider surgical/dermatologic consultation for excision of the  sebaceous cyst in the right axilla given progressive enlargement per  the patient and tenderness.  2. Screening mammogram in one year.(Code:SM-B-01Y)  I have discussed the findings and recommendations with the patient.  If applicable, a reminder letter will be sent to the patient  regarding the next appointment.  BI-RADS CATEGORY 2: Benign.  Electronically Signed  By: Everlean Alstrom M.D.  On: 10/23/2020 08:47   Assessment & Plan:  Kiva Norland is a 55 y.o. female with a sebaceous cyst in  the right axilla she wants removed. We have discussed risk of bleeding, infection, recurrence, and keloid formation. I did get some silicone sheets for her dressing to reduce the risk.   All questions were answered to the satisfaction of the patient.    Virl Cagey 12/19/2020, 7:23 AM

## 2020-12-19 NOTE — Progress Notes (Addendum)
[facility]  After further reading about silicone gel sheet I realized she needed to gradually increase her time for using it each day.  I changed her post operative dressing with sterile gloves to sterile clear petroleum, gauze, and tegaderm. She tolerated this well.  The piece of silicone was washed and placed into the plastic cup we provided.  I let her daughter know that surgery was completed and that we have instructions written down.   Here are her instructions:   Keloid Prevention: Remove The Operative Bandage On 12/21/2020.  Always wash your hands when dealing with your wound.  Replace it with a small piece of silicone gel sheet on 7/34/1937 (see piece in the plastic cup). Wear the sheet for 4 hours daily for two days (12/21/2020 and 12/22/2020). Between applications place clear petroleum gel on the wound and cover. Between applications wash the gel sheet daily using lukewarm soapy water, rinse well and dry by patting it with a non-fluffy towel. You can store it in the plastic cup we provided.  Wear the same piece of silicone until it starts to deteriorate, then replace it with a new piece. After the first two days, start to increase the time you wear the silicone by 1 hour each day (starting 12/23/2020). Reaching up to 20-24 hours a day.  Between applications of the sheet use clear petroleum gel.  You can cut a small piece from the sheets provided at the hospital.  Limit the stretching and pulling on the incision as much as possible.  Keeping the area moist and tension free will help prevent keloids.  It is ok to shower and get the incision wet after 12/21/2020 but until then not get the dressing wet.  After you shower pat area dry and reapply your silicone gel sheet.   Curlene Labrum, MD [facility] [address] [place], [place] [address] [phone] (office)

## 2020-12-19 NOTE — Transfer of Care (Signed)
Immediate Anesthesia Transfer of Care Note  Patient: Kayla Choi  Procedure(s) Performed: EXCISION CYST; 2 CM; AXILLA (Right )  Patient Location: PACU  Anesthesia Type:General  Level of Consciousness: awake and alert   Airway & Oxygen Therapy: Patient Spontanous Breathing and Patient connected to nasal cannula oxygen  Post-op Assessment: Report given to RN and Post -op Vital signs reviewed and stable  Post vital signs: Reviewed and stable  Last Vitals:  Vitals Value Taken Time  BP 141/84   Temp    Pulse 103 12/19/20 0824  Resp 16 12/19/20 0824  SpO2 100 % 12/19/20 0824  Vitals shown include unvalidated device data.  Last Pain:  Vitals:   12/19/20 0646  TempSrc: Oral  PainSc: 0-No pain      Patients Stated Pain Goal: 8 (90/24/09 7353)  Complications: No complications documented.

## 2020-12-19 NOTE — Discharge Instructions (Signed)
Keloid Prevention: Remove The Operative Bandage On 12/21/2020.  Always wash your hands when dealing with your wound.  Replace it with a small piece of silicone gel sheet on 0000000 (see piece in the plastic cup). Wear the sheet for 4 hours daily for two days (12/21/2020 and 12/22/2020). Between applications place clear petroleum gel on the wound and cover. Between applications wash the gel sheet daily using lukewarm soapy water, rinse well and dry by patting it with a non-fluffy towel. You can store it in the plastic cup we provided.  Wear the same piece of silicone until it starts to deteriorate, then replace it with a new piece. After the first two days, start to increase the time you wear the silicone by 1 hour each day (starting 12/23/2020). Reaching up to 20-24 hours a day.  Between applications of the sheet use clear petroleum gel.  You can cut a small piece from the sheets provided at the hospital.  Limit the stretching and pulling on the incision as much as possible.  Keeping the area moist and tension free will help prevent keloids.  It is ok to shower and get the incision wet after 12/21/2020 but until then not get the dressing wet.  After you shower pat area dry and reapply your silicone gel sheet.   Pain Expectations and Narcotics: -After surgery you will have pain associated with your incisions and this is normal. The pain is muscular and nerve pain, and will get better with time. -You are encouraged and expected to take non narcotic medications like tylenol and ibuprofen (when able) to treat pain as multiple modalities can aid with pain treatment. -Narcotics are only used when pain is severe or there is breakthrough pain. -You are not expected to have a pain score of 0 after surgery, as we cannot prevent pain. A pain score of 3-4 that allows you to be functional, move, walk, and tolerate some activity is the goal. The pain will continue to improve over the days after surgery and is  dependent on your surgery. -Due to [place] law, we are only able to give a certain amount of pain medication to treat post operative pain, and we only give additional narcotics on a patient by patient basis.  -For most laparoscopic surgery, studies have shown that the majority of patients only need 10-15 narcotic pills, and for open surgeries most patients only need 15-20.   -Having appropriate expectations of pain and knowledge of pain management with non narcotics is important as we do not want anyone to become addicted to narcotic pain medication.  -Using ice packs in the first 48 hours and heating pads after 48 hours, wearing an abdominal binder (when recommended), and using over the counter medications are all ways to help with pain management.   -Simple acts like meditation and mindfulness practices after surgery can also help with pain control and research has proven the benefit of these practices.  Medication: Take tylenol and ibuprofen as needed for pain control, alternating every 4-6 hours.  Example:  Tylenol 1000mg  @ 6am, 12noon, 6pm, 75midnight (Do not exceed 4000mg  of tylenol a day). Ibuprofen 800mg  @ 9am, 3pm, 9pm, 3am (Do not exceed 3600mg  of ibuprofen a day).  Take tramadol for breakthrough pain every 4 hours.  Take Colace for constipation related to narcotic pain medication. If you do not have a bowel movement in 2 days, take Miralax over the counter.  Drink plenty of water to also prevent constipation.   Contact Information: If  you have questions or concerns, please call our office, [phone], Monday- Thursday 8AM-5PM and Friday 8AM-12Noon.  If it is after hours or on the weekend, please call [facility] Main [facility], [phone], and ask to speak to the surgeon on call for Dr. Constance Haw at [facility].     Ondansetron oral dissolving tablet What is this medicine? ONDANSETRON (on DAN se tron) is used to treat nausea and vomiting caused by chemotherapy. It is also used to prevent or  treat nausea and vomiting after surgery. This medicine may be used for other purposes; ask your health care provider or pharmacist if you have questions. COMMON BRAND NAME(S): Zofran ODT What should I tell my health care provider before I take this medicine? They need to know if you have any of these conditions:  heart disease  history of irregular heartbeat  liver disease  low levels of magnesium or potassium in the blood  an unusual or allergic reaction to ondansetron, granisetron, other medicines, foods, dyes, or preservatives  pregnant or trying to get pregnant  breast-feeding How should I use this medicine? These tablets are made to dissolve in the mouth. Do not try to push the tablet through the foil backing. With dry hands, peel away the foil backing and gently remove the tablet. Place the tablet in the mouth and allow it to dissolve, then swallow. While you may take these tablets with water, it is not necessary to do so. Talk to your pediatrician regarding the use of this medicine in children. Special care may be needed. Overdosage: If you think you have taken too much of this medicine contact a poison control center or emergency room at once. NOTE: This medicine is only for you. Do not share this medicine with others. What if I miss a dose? If you miss a dose, take it as soon as you can. If it is almost time for your next dose, take only that dose. Do not take double or extra doses. What may interact with this medicine? Do not take this medicine with any of the following medications:  apomorphine  certain medicines for fungal infections like fluconazole, itraconazole, ketoconazole, posaconazole, voriconazole  cisapride  dronedarone  pimozide  thioridazine This medicine may also interact with the following medications:  carbamazepine  certain medicines for depression, anxiety, or psychotic disturbances  fentanyl  linezolid  MAOIs like Carbex, Eldepryl,  Marplan, Nardil, and Parnate  methylene blue (injected into a vein)  other medicines that prolong the QT interval (cause an abnormal heart rhythm) like dofetilide, ziprasidone  phenytoin  rifampicin  tramadol This list may not describe all possible interactions. Give your health care provider a list of all the medicines, herbs, non-prescription drugs, or dietary supplements you use. Also tell them if you smoke, drink alcohol, or use illegal drugs. Some items may interact with your medicine. What should I watch for while using this medicine? Check with your doctor or health care professional as soon as you can if you have any sign of an allergic reaction. What side effects may I notice from receiving this medicine? Side effects that you should report to your doctor or health care professional as soon as possible:  allergic reactions like skin rash, itching or hives, swelling of the face, lips, or tongue  breathing problems  confusion  dizziness  fast or irregular heartbeat  feeling faint or lightheaded, falls  fever and chills  loss of balance or coordination  seizures  sweating  swelling of the hands and feet  tightness in the chest  tremors  unusually weak or tired Side effects that usually do not require medical attention (report to your doctor or health care professional if they continue or are bothersome):  constipation or diarrhea  headache This list may not describe all possible side effects. Call your doctor for medical advice about side effects. You may report side effects to FDA at 1-800-FDA-1088. Where should I keep my medicine? Keep out of the reach of children. Store between 2 and 30 degrees C (36 and 86 degrees F). Throw away any unused medicine after the expiration date. NOTE: This sheet is a summary. It may not cover all possible information. If you have questions about this medicine, talk to your doctor, pharmacist, or health care provider.  2021  Elsevier/Gold Standard (2018-11-16 07:14:10)    Tramadol; Acetaminophen Oral Tablets What is this medicine? TRAMADOL; ACETAMINOPHEN (TRA ma dol; ea set a MEE noe fen) is a pain reliever. It is used to treat moderate pain. This medicine may be used for other purposes; ask your health care provider or pharmacist if you have questions. COMMON BRAND NAME(S): Ultracet What should I tell my health care provider before I take this medicine? They need to know if you have any of these conditions:  brain tumor  drug abuse or addiction  head injury  heart disease  if you often drink alcohol  kidney disease or trouble passing urine  low adrenal gland function  lung disease, asthma, or breathing problems  seizures  stomach or intestine problems  taken an MAOI like Marplan, Nardil, or Parnate in the last 14 days  an unusual or allergic reaction to tramadol, acetaminophen, codeine, other opioid analgesics, other medicines, foods, dyes, or preservatives  pregnant or trying to get pregnant  breast-feeding How should I use this medicine? Take this medicine by mouth with a full glass of water. Follow the directions on the prescription label. If the medicine upsets your stomach, take it with food or milk. Do not take your medicine more often than directed. A special MedGuide will be given to you by the pharmacist with each prescription and refill. Be sure to read this information carefully each time. Talk to your pediatrician regarding the use of this medicine in children. Special care may be needed. This medicine is not for use in children less than 23 years of age. Do not give this medicine to a child younger than 24 years of age after surgery to remove the tonsils and/or adenoids. Overdosage: If you think you have taken too much of this medicine contact a poison control center or emergency room at once. NOTE: This medicine is only for you. Do not share this medicine with others. What if I  miss a dose? If you miss a dose, take it as soon as you can. If it is almost time for your next dose, take only that dose. Do not take double or extra doses. What may interact with this medicine? Do not take this medication with any of the following medicines:  MAOIs like Carbex, Eldepryl, Marplan, Nardil, and Parnate This medicine may also interact with the following medications:  alcohol  antihistamines for allergy, cough and cold  certain medicines for anxiety or sleep  certain medicines for depression like amitriptyline, fluoxetine, sertraline  certain medicines for migraine headache like almotriptan, eletriptan, frovatriptan, naratriptan, rizatriptan, sumatriptan, zolmitriptan  certain medicines for seizures like carbamazepine, oxcarbazepine, phenobarbital, primidone  certain medicines that treat or prevent blood clots like warfarin  digoxin  furazolidone  general anesthetics like halothane, isoflurane, methoxyflurane, propofol  linezolid  local anesthetics like lidocaine, pramoxine, tetracaine  medicines that relax muscles for surgery  other narcotic medicines for pain or cough  phenothiazines like chlorpromazine, mesoridazine, prochlorperazine, thioridazine  procarbazine This list may not describe all possible interactions. Give your health care provider a list of all the medicines, herbs, non-prescription drugs, or dietary supplements you use. Also tell them if you smoke, drink alcohol, or use illegal drugs. Some items may interact with your medicine. What should I watch for while using this medicine? Tell your health care provider if your pain does not go away, if it gets worse, or if you have new or a different type of pain. You may develop tolerance to this drug. Tolerance means that you will need a higher dose of the drug for pain relief. Tolerance is normal and is expected if you take this drug for a long time. Do not suddenly stop taking your drug because you  may develop a severe reaction. Your body becomes used to the drug. This does NOT mean you are addicted. Addiction is a behavior related to getting and using a drug for a nonmedical reason. If you have pain, you have a medical reason to take pain drug. Your health care provider will tell you how much drug to take. If your health care provider wants you to stop the drug, the dose will be slowly lowered over time to avoid any side effects. If you take other drugs that also cause drowsiness like other narcotic pain drugs, benzodiazepines, or other drugs for sleep, you may have more side effects. Give your health care provider a list of all drugs you use. He or she will tell you how much drug to take. Do not take more drug than directed. Get emergency help right away if you have trouble breathing or are unusually tired or sleepy. Talk to your health care provider about naloxone and how to get it. Naloxone is an emergency drug used for an opioid overdose. An overdose can happen if you take too much opioid. It can also happen if an opioid is taken with some other drugs or substances, like alcohol. Know the symptoms of an overdose, like trouble breathing, unusually tired or sleepy, or not being able to respond or wake up. Make sure to tell caregivers and close contacts where it is stored. Make sure they know how to use it. After naloxone is given, you must get emergency help right away. Naloxone is a temporary treatment. Repeat doses may be needed. Do not take other drugs that contain acetaminophen with this drug. Many non-prescription drugs contain acetaminophen. Always read labels carefully. If you have questions, ask your health care provider. If you take too much acetaminophen, get medical help right away. Too much acetaminophen can be very dangerous and cause liver damage. Even if you do not have symptoms, it is important to get help right away. This drug may cause serious skin reactions. They can happen weeks to  months after starting the drug. Contact your health care provider right away if you notice fevers or flu-like symptoms with a rash. The rash may be red or purple and then turn into blisters or peeling of the skin. Or, you might notice a red rash with swelling of the face, lips or lymph nodes in your neck or under your arms. You may get drowsy or dizzy. Do not drive, use machinery, or do anything that needs mental alertness until you  know how this drug affects you. Do not stand up or sit up quickly, especially if you are an older patient. This reduces the risk of dizzy or fainting spells. Alcohol may interfere with the effect of this drug. Avoid alcoholic drinks. This drug will cause constipation. If you do not have a bowel movement for 3 days, call your health care provider. Your mouth may get dry. Chewing sugarless gum or sucking hard candy and drinking plenty of water may help. Contact your health care provider if the problem does not go away or is severe. What side effects may I notice from receiving this medicine? Side effects that you should report to your doctor or health care professional as soon as possible:  allergic reactions like skin rash, itching or hives, swelling of the face, lips, or tongue  confusion  kidney injury (trouble passing urine or change in the amount of urine)  low adrenal gland function (nausea; vomiting; loss of appetite; unusually weak or tired; dizziness; low blood pressure)  low blood pressure (dizziness; feeling faint or lightheaded, falls; unusually weak or tired)  redness, blistering, peeling or loosening of the skin, including inside the mouth  seizures  serotonin syndrome (irritable; confusion; diarrhea; fast or irregular heartbeat; muscle twitching; stiff muscles; trouble walking; sweating; high fever; seizures; chills; vomiting)  trouble breathing  yellowing of the eyes or skin Side effects that usually do not require medical attention (report to your  doctor or health care professional if they continue or are bothersome):  constipation  dry mouth  nausea, vomiting  tiredness This list may not describe all possible side effects. Call your doctor for medical advice about side effects. You may report side effects to FDA at 1-800-FDA-1088. Where should I keep my medicine? Keep out of the reach of children. Tramadol is a morphine-like drug that can be abused. Keep your medicine in a safe place to protect it from theft. Do not share this medicine with anyone. Selling or giving away this medicine is dangerous and is against the law. This medicine may cause accidental overdose and death if it taken by other adults, children, or pets. Mix any unused medicine with a substance like cat litter or coffee grounds. Then throw the medicine away in a sealed container like a sealed bag or a coffee can with a lid. Do not use the medicine after the expiration date. Store at room temperature between 15 and 30 degrees C (59 and 86 degrees F). NOTE: This sheet is a summary. It may not cover all possible information. If you have questions about this medicine, talk to your doctor, pharmacist, or health care provider.  2021 Elsevier/Gold Standard (2019-10-13 16:52:31)    General Anesthesia, Adult, Care After This sheet gives you information about how to care for yourself after your procedure. Your health care provider may also give you more specific instructions. If you have problems or questions, contact your health care provider. What can I expect after the procedure? After the procedure, the following side effects are common:  Pain or discomfort at the IV site.  Nausea.  Vomiting.  Sore throat.  Trouble concentrating.  Feeling cold or chills.  Feeling weak or tired.  Sleepiness and fatigue.  Soreness and body aches. These side effects can affect parts of the body that were not involved in surgery. Follow these instructions at home: For the  time period you were told by your health care provider:  Rest.  Do not participate in activities where you could fall  or become injured.  Do not drive or use machinery.  Do not drink alcohol.  Do not take sleeping pills or medicines that cause drowsiness.  Do not make important decisions or sign legal documents.  Do not take care of children on your own.   Eating and drinking  Follow any instructions from your health care provider about eating or drinking restrictions.  When you feel hungry, start by eating small amounts of foods that are soft and easy to digest (bland), such as toast. Gradually return to your regular diet.  Drink enough fluid to keep your urine pale yellow.  If you vomit, rehydrate by drinking water, juice, or clear broth. General instructions  If you have sleep apnea, surgery and certain medicines can increase your risk for breathing problems. Follow instructions from your health care provider about wearing your sleep device: ? Anytime you are sleeping, including during daytime naps. ? While taking prescription pain medicines, sleeping medicines, or medicines that make you drowsy.  Have a responsible adult stay with you for the time you are told. It is important to have someone help care for you until you are awake and alert.  Return to your normal activities as told by your health care provider. Ask your health care provider what activities are safe for you.  Take over-the-counter and prescription medicines only as told by your health care provider.  If you smoke, do not smoke without supervision.  Keep all follow-up visits as told by your health care provider. This is important. Contact a health care provider if:  You have nausea or vomiting that does not get better with medicine.  You cannot eat or drink without vomiting.  You have pain that does not get better with medicine.  You are unable to pass urine.  You develop a skin rash.  You have a  fever.  You have redness around your IV site that gets worse. Get help right away if:  You have difficulty breathing.  You have chest pain.  You have blood in your urine or stool, or you vomit blood. Summary  After the procedure, it is common to have a sore throat or nausea. It is also common to feel tired.  Have a responsible adult stay with you for the time you are told. It is important to have someone help care for you until you are awake and alert.  When you feel hungry, start by eating small amounts of foods that are soft and easy to digest (bland), such as toast. Gradually return to your regular diet.  Drink enough fluid to keep your urine pale yellow.  Return to your normal activities as told by your health care provider. Ask your health care provider what activities are safe for you. This information is not intended to replace advice given to you by your health care provider. Make sure you discuss any questions you have with your health care provider. Document Revised: 08/09/2020 Document Reviewed: 03/08/2020 Elsevier Patient Education  2021 [employer].

## 2020-12-19 NOTE — Op Note (Signed)
[facility] Operative Note  12/19/20  Preoperative Diagnosis: Right axilla sebaceous cyst    Postoperative Diagnosis: Same   Procedure(s) Performed: Excision of cyst 2.5cm    Surgeon: Lanell Matar. Constance Haw, MD   Assistants: No qualified resident was available    Anesthesia: General anesthesia    Anesthesiologist: Dr. Charna Elizabeth   Specimens: Sebaceous cyst    Estimated Blood Loss: Minimal   Blood Replacement: None    Complications: None   Wound Class: Clean contaminated    Operative Indications: [name] is a 55 yo with a history of right axilla sebaceous cyst that has been infected in the past. She also has keloid issues and we discussed the risk of excision including bleeding, infection, recurrence, keloiding. I have obtained silicone sheets for her to use post operatively as a dressing to try and prevent keloid formation.   Findings: Large cyst in the right axilla, removed intact    Procedure: The patient was taken to the operating room and placed supine. General anesthesia was induced. Intravenous antibiotics were administered per protocol.  The right axilla was prepared and draped in the usual sterile fashion.   A small elliptical excision of skin was made over the superficial cystic area.  Using care I excised the cyst in its entirety without rupture or leakage using sharp dissection with scissors. The cyst measured 2.5cm. The cavity was injected with anesthetic and irrigated. Hemostasis was achieved with cautery. A 4-0 subcuticular Monocryl was used to close the wound. A sterile dressing of silicone gel sheet, gauze and tegaderm were placed over the area.   Final inspection revealed acceptable hemostasis. All counts were correct at the end of the case. The patient was awakened from anesthesia and extubated without complication.  The patient went to the PACU in stable condition.   Curlene Labrum, MD [facility] [address] [place], [place] [address] [phone] (office)

## 2020-12-19 NOTE — Anesthesia Preprocedure Evaluation (Signed)
Anesthesia Evaluation  Patient identified by MRN, date of birth, ID band Patient awake    Reviewed: Allergy & Precautions, NPO status , Patient's Chart, lab work & pertinent test results  History of Anesthesia Complications Negative for: history of anesthetic complications  Airway Mallampati: II  TM Distance: >3 FB Neck ROM: Full    Dental  (+) Missing, Dental Advisory Given   Pulmonary asthma , COPD,  COPD inhaler,    Pulmonary exam normal breath sounds clear to auscultation       Cardiovascular Exercise Tolerance: Good hypertension, Pt. on medications Normal cardiovascular exam Rhythm:Regular Rate:Normal  07-Feb-2019 23:50:50 [facility]-AP-300 ROUTINE RECORD Sinus tachycardia Nonspecific ST and T wave abnormality Abnormal ECG Confirmed by Asencion Noble [phone]) on 02/14/2019 10:12:03 PM   Neuro/Psych  Headaches, PSYCHIATRIC DISORDERS Anxiety Depression  Neuromuscular disease    GI/Hepatic Neg liver ROS, GERD (denied h/o reflux)  ,  Endo/Other  diabetes (FSBS - 226), Poorly Controlled, Type 2, Oral Hypoglycemic Agents  Renal/GU   negative genitourinary   Musculoskeletal  (+) Arthritis , Neck spasm, back pain, knee pain   Abdominal   Peds  (+) ADHD Hematology  (+) anemia ,   Anesthesia Other Findings   Reproductive/Obstetrics                            Anesthesia Physical Anesthesia Plan  ASA: III  Anesthesia Plan: General   Post-op Pain Management:    Induction: Intravenous  PONV Risk Score and Plan: 4 or greater and Ondansetron, Midazolam and Metaclopromide  Airway Management Planned: LMA  Additional Equipment:   Intra-op Plan:   Post-operative Plan: Extubation in OR  Informed Consent: I have reviewed the patients History and Physical, chart, labs and discussed the procedure including the risks, benefits and alternatives for the proposed anesthesia with the  patient or authorized representative who has indicated his/her understanding and acceptance.     Dental advisory given  Plan Discussed with: CRNA and Surgeon  Anesthesia Plan Comments:        Anesthesia Quick Evaluation

## 2020-12-19 NOTE — Anesthesia Procedure Notes (Signed)
Procedure Name: LMA Insertion Date/Time: 12/19/2020 7:35 AM Performed by: Karna Dupes, CRNA Pre-anesthesia Checklist: Emergency Drugs available, Patient identified, Suction available and Patient being monitored Patient Re-evaluated:Patient Re-evaluated prior to induction Oxygen Delivery Method: Circle system utilized Preoxygenation: Pre-oxygenation with 100% oxygen Induction Type: IV induction LMA: LMA inserted LMA Size: 4.0 Tube secured with: Tape Dental Injury: Teeth and Oropharynx as per pre-operative assessment

## 2020-12-20 LAB — SURGICAL PATHOLOGY

## 2020-12-21 ENCOUNTER — Encounter (HOSPITAL_COMMUNITY): Payer: Self-pay | Admitting: General Surgery

## 2020-12-24 ENCOUNTER — Ambulatory Visit: Payer: Self-pay | Admitting: Family Medicine

## 2020-12-26 ENCOUNTER — Other Ambulatory Visit: Payer: Self-pay

## 2020-12-26 ENCOUNTER — Ambulatory Visit (INDEPENDENT_AMBULATORY_CARE_PROVIDER_SITE_OTHER): Payer: Medicaid Other | Admitting: Family Medicine

## 2020-12-26 ENCOUNTER — Encounter: Payer: Self-pay | Admitting: Family Medicine

## 2020-12-26 VITALS — BP 140/68 | HR 94 | Temp 98.5°F | Resp 16 | Ht 61.0 in | Wt 187.0 lb

## 2020-12-26 DIAGNOSIS — E1169 Type 2 diabetes mellitus with other specified complication: Secondary | ICD-10-CM | POA: Diagnosis not present

## 2020-12-26 DIAGNOSIS — I10 Essential (primary) hypertension: Secondary | ICD-10-CM

## 2020-12-26 DIAGNOSIS — J42 Unspecified chronic bronchitis: Secondary | ICD-10-CM

## 2020-12-26 NOTE — Patient Instructions (Signed)
F/U 2 months  

## 2020-12-26 NOTE — Progress Notes (Unsigned)
   Subjective:    Patient ID: Kayla Choi, female    DOB: 03/29/1966, 55 y.o.   MRN: [id number]  Patient presents for Follow-up (DM)  DM- last A1C 11.9% in December  states her CBG WAS LOW this AM and her head was hurting, she is taking glipizide 10mg  BID, farxiga , did not bring meter today   HTN- she is taking lisinipril HCTZ   HLD- crestor taking as prescribed per report  She had cyst removed from left axilla, she was confused about cleaning ,, reviewed the surgeons discharge instructions with her regarding wound care  Review Of Systems:  GEN- denies fatigue, fever, weight loss,weakness, recent illness HEENT- denies eye drainage, change in vision, nasal discharge, CVS- denies chest pain, palpitations RESP- denies SOB, cough, wheeze ABD- denies N/V, change in stools, abd pain GU- denies dysuria, hematuria, dribbling, incontinence MSK- denies joint pain, muscle aches, injury Neuro- denies headache, dizziness, syncope, seizure activity       Objective:    BP 140/68   Pulse 94   Temp 98.5 F (36.9 C) (Temporal)   Resp 16   Ht 5\' 1"  (1.549 m)   Wt 187 lb (84.8 kg)   SpO2 97%   BMI 35.33 kg/m  GEN- NAD, alert and oriented x3 HEENT- PERRL, EOMI, non injected sclera, pink conjunctiva, MMM, oropharynx clear Neck- Supple, no thyromegaly CVS- RRR, no murmur RESP-CTAB Skin- right axilla bandage in place, small superficial abrasion to skin (tape removed skin) EXT- No edema Pulses- Radial, DP- 2+        Assessment & Plan:     5 minutes spent discussion wound care instructions with pt    Problem List Items Addressed This Visit      Unprioritized   COPD (chronic obstructive pulmonary disease) ([place])    No recent flare      Diabetes mellitus, type II ([place])    Uncontrolled, but states she has been consistent with meds First goal is getting A1C below 9% Discussed she has to eat with glipizide Recommend lower carb diet Recheck intermin A1C, she is on Farxiga and  tolerating without difficulty  She is on ACEI/STATIN      Relevant Orders   Hemoglobin A1c (Completed)   Essential hypertension - Primary    Blood pressure is much improved No changes to lisinopril HCTZ Discussed importance of compliance with regular medication use for her medical conditions      Relevant Orders   Basic metabolic panel (Completed)   CBC with Differential/Platelet (Completed)      Note: This dictation was prepared with Dragon dictation along with smaller phrase technology. Any transcriptional errors that result from this process are unintentional.

## 2020-12-27 ENCOUNTER — Encounter: Payer: Self-pay | Admitting: Family Medicine

## 2020-12-27 LAB — BASIC METABOLIC PANEL
BUN: 18 mg/dL (ref 7–25)
CO2: 23 mmol/L (ref 20–32)
Calcium: 9.6 mg/dL (ref 8.6–10.4)
Chloride: 106 mmol/L (ref 98–110)
Creat: 0.89 mg/dL (ref 0.50–1.05)
Glucose, Bld: 185 mg/dL — ABNORMAL HIGH (ref 65–99)
Potassium: 4.2 mmol/L (ref 3.5–5.3)
Sodium: 138 mmol/L (ref 135–146)

## 2020-12-27 LAB — CBC WITH DIFFERENTIAL/PLATELET
Absolute Monocytes: 373 cells/uL (ref 200–950)
Basophils Absolute: 48 cells/uL (ref 0–200)
Basophils Relative: 0.7 %
Eosinophils Absolute: 138 cells/uL (ref 15–500)
Eosinophils Relative: 2 %
HCT: 43.7 % (ref 35.0–45.0)
Hemoglobin: 14.4 g/dL (ref 11.7–15.5)
Lymphs Abs: 2291 cells/uL (ref 850–3900)
MCH: 30.6 pg (ref 27.0–33.0)
MCHC: 33 g/dL (ref 32.0–36.0)
MCV: 93 fL (ref 80.0–100.0)
MPV: 11.7 fL (ref 7.5–12.5)
Monocytes Relative: 5.4 %
Neutro Abs: 4050 cells/uL (ref 1500–7800)
Neutrophils Relative %: 58.7 %
Platelets: 364 10*3/uL (ref 140–400)
RBC: 4.7 10*6/uL (ref 3.80–5.10)
RDW: 11.7 % (ref 11.0–15.0)
Total Lymphocyte: 33.2 %
WBC: 6.9 10*3/uL (ref 3.8–10.8)

## 2020-12-27 LAB — HEMOGLOBIN A1C
Hgb A1c MFr Bld: 10.2 % of total Hgb — ABNORMAL HIGH (ref <5.7)
Mean Plasma Glucose: 246 mg/dL
eAG (mmol/L): 13.6 mmol/L

## 2020-12-27 NOTE — Assessment & Plan Note (Signed)
Uncontrolled, but states she has been consistent with meds First goal is getting A1C below 9% Discussed she has to eat with glipizide Recommend lower carb diet Recheck intermin A1C, she is on [place] and tolerating without difficulty  She is on ACEI/STATIN

## 2020-12-27 NOTE — Assessment & Plan Note (Signed)
Blood pressure is much improved No changes to lisinopril HCTZ Discussed importance of compliance with regular medication use for her medical conditions

## 2020-12-27 NOTE — Assessment & Plan Note (Signed)
No recent flare.  

## 2020-12-28 MED ORDER — TRULICITY 0.75 MG/0.5ML ~~LOC~~ SOAJ: 0.7500 mg | SUBCUTANEOUS | 2 refills | Status: DC

## 2020-12-28 NOTE — Addendum Note (Signed)
Addended by: Vic Blackbird F on: 12/28/2020 08:32 AM   Modules accepted: Orders

## 2021-01-01 ENCOUNTER — Telehealth: Payer: Self-pay | Admitting: Family Medicine

## 2021-01-01 NOTE — Telephone Encounter (Signed)
Prior auth requested for Dulaglutide (TRULICITY) 1.70 YF/7.4BS SOPN  KEY: [id number]

## 2021-01-03 MED ORDER — TRULICITY 0.75 MG/0.5ML ~~LOC~~ SOAJ: 0.7500 mg | SUBCUTANEOUS | 2 refills | Status: DC

## 2021-01-03 NOTE — Telephone Encounter (Signed)
Received PA determination.   PA Case [id number] approved 01/03/2021- 01/03/2022.  Pharmacy made aware.

## 2021-01-03 NOTE — Telephone Encounter (Signed)
Received request from pharmacy for PA on Trulicity.  PA submitted.   Dx: E11.9- DM.   [facility] has not yet replied to your PA request. You may close this dialog, return to your dashboard, and perform other tasks.  To check for an update later, open this request again from your dashboard.  If [facility] has not replied to your request within 24 hours please contact [facility] [place] at [phone].

## 2021-01-10 ENCOUNTER — Other Ambulatory Visit: Payer: Self-pay

## 2021-01-10 ENCOUNTER — Other Ambulatory Visit: Payer: Self-pay | Admitting: Family Medicine

## 2021-01-10 ENCOUNTER — Encounter: Payer: Self-pay | Admitting: General Surgery

## 2021-01-10 ENCOUNTER — Ambulatory Visit (INDEPENDENT_AMBULATORY_CARE_PROVIDER_SITE_OTHER): Payer: Medicaid Other | Admitting: General Surgery

## 2021-01-10 VITALS — BP 172/117 | HR 92 | Temp 97.8°F | Resp 14 | Ht 61.0 in | Wt 188.0 lb

## 2021-01-10 DIAGNOSIS — L723 Sebaceous cyst: Secondary | ICD-10-CM

## 2021-01-10 DIAGNOSIS — L910 Hypertrophic scar: Secondary | ICD-10-CM

## 2021-01-10 NOTE — Progress Notes (Signed)
[facility] Note   HPI:  55 y.o. Female presents to clinic for post-op follow-up evaluation of her right axilla. She has been using the silicone sheets. She reports some pain with moving her arm but she has not been using it a lot or stretching it any.   Review of Systems:  No drainage No redness  All other review of systems: otherwise negative   Pathology: FINAL MICROSCOPIC DIAGNOSIS:   A. CYST, RIGHT AXILLA, EXCISION:  - Benign epidermal inclusion cyst  - No malignancy identified   Vital Signs:  BP (!) 172/117   Pulse 92   Temp 97.8 F (36.6 C) (Other (Comment))   Resp 14   Ht 5\' 1"  (1.549 m)   Wt 188 lb (85.3 kg)   SpO2 98%   BMI 35.52 kg/m    Physical Exam:  Physical Exam Vitals reviewed.  Cardiovascular:     Rate and Rhythm: Normal rate.  Pulmonary:     Effort: Pulmonary effort is normal.  Musculoskeletal:     Comments: Right axilla incision c/d/i without erythema, drainage or keloid formation  Neurological:     Mental Status: She is alert.     Assessment:  55 y.o. yo Female with excision of a cyst from the right axilla and history of keloid. She has been doing the silicone sheets as instructed.   Plan:  Continue the sheets as you are doing now until 01/16/2021 which is 4 weeks out from surgery.  Try to continue silicone sheets for at least 4-8 hours a day for 90 days after surgery. If you need to go out during day and wear deodorant that is fine but you will need to gently clean the area, pat dry, and reapply silicone to the area while at home and at night.  Prn follow up   Curlene Labrum, MD [facility] [address] [place], [place] [address] [phone] (office)

## 2021-01-10 NOTE — Patient Instructions (Addendum)
Continue the sheets as you are doing now until 01/16/2021 which is 4 weeks out from surgery.  Try to continue silicone sheets for at least 4-8 hours a day for 90 days after surgery. If you need to go out during day and wear deodorant that is fine but you will need to gently clean the area, pat dry, and reapply silicone to the area while at home and at night.

## 2021-01-11 ENCOUNTER — Other Ambulatory Visit: Payer: Self-pay | Admitting: Family Medicine

## 2021-02-28 ENCOUNTER — Other Ambulatory Visit: Payer: Self-pay | Admitting: Family Medicine

## 2021-03-01 ENCOUNTER — Other Ambulatory Visit: Payer: Self-pay | Admitting: Family Medicine

## 2021-03-26 ENCOUNTER — Other Ambulatory Visit: Payer: Self-pay | Admitting: Family Medicine

## 2021-05-03 ENCOUNTER — Other Ambulatory Visit: Payer: Self-pay | Admitting: Family Medicine

## 2021-06-07 ENCOUNTER — Telehealth: Payer: Self-pay

## 2021-06-07 NOTE — Telephone Encounter (Signed)
No new patient per [place] 

## 2021-06-17 ENCOUNTER — Telehealth: Payer: Self-pay | Admitting: Family Medicine

## 2021-06-17 NOTE — Telephone Encounter (Signed)
Patient requesting a refill on her diabetic medications. She is still trying to find a new provider. She uses [facility].

## 2021-06-18 MED ORDER — GLIPIZIDE 10 MG PO TABS: 10.0000 mg | ORAL_TABLET | Freq: Two times a day (BID) | ORAL | 0 refills | Status: DC

## 2021-06-18 MED ORDER — TRULICITY 0.75 MG/0.5ML ~~LOC~~ SOAJ: SUBCUTANEOUS | 2 refills | Status: DC

## 2021-06-18 NOTE — Telephone Encounter (Signed)
Meds per Dr. Dennard Schaumann ok to be sent to pharmacy.

## 2021-09-13 DIAGNOSIS — Z1329 Encounter for screening for other suspected endocrine disorder: Secondary | ICD-10-CM | POA: Diagnosis not present

## 2021-09-13 DIAGNOSIS — E559 Vitamin D deficiency, unspecified: Secondary | ICD-10-CM | POA: Diagnosis not present

## 2021-09-13 DIAGNOSIS — E6609 Other obesity due to excess calories: Secondary | ICD-10-CM | POA: Diagnosis not present

## 2021-09-13 DIAGNOSIS — J449 Chronic obstructive pulmonary disease, unspecified: Secondary | ICD-10-CM | POA: Diagnosis not present

## 2021-09-13 DIAGNOSIS — Z130 Encounter for screening for diseases of the blood and blood-forming organs and certain disorders involving the immune mechanism: Secondary | ICD-10-CM | POA: Diagnosis not present

## 2021-09-13 DIAGNOSIS — F339 Major depressive disorder, recurrent, unspecified: Secondary | ICD-10-CM | POA: Diagnosis not present

## 2021-09-13 DIAGNOSIS — E785 Hyperlipidemia, unspecified: Secondary | ICD-10-CM | POA: Diagnosis not present

## 2021-09-13 DIAGNOSIS — E1165 Type 2 diabetes mellitus with hyperglycemia: Secondary | ICD-10-CM | POA: Diagnosis not present

## 2021-09-13 DIAGNOSIS — M545 Low back pain, unspecified: Secondary | ICD-10-CM | POA: Diagnosis not present

## 2021-09-13 DIAGNOSIS — E119 Type 2 diabetes mellitus without complications: Secondary | ICD-10-CM | POA: Diagnosis not present

## 2021-09-13 DIAGNOSIS — I10 Essential (primary) hypertension: Secondary | ICD-10-CM | POA: Diagnosis not present

## 2021-10-11 DIAGNOSIS — E559 Vitamin D deficiency, unspecified: Secondary | ICD-10-CM | POA: Diagnosis not present

## 2021-10-11 DIAGNOSIS — Z23 Encounter for immunization: Secondary | ICD-10-CM | POA: Diagnosis not present

## 2021-10-11 DIAGNOSIS — E1165 Type 2 diabetes mellitus with hyperglycemia: Secondary | ICD-10-CM | POA: Diagnosis not present

## 2021-10-11 DIAGNOSIS — N951 Menopausal and female climacteric states: Secondary | ICD-10-CM | POA: Diagnosis not present

## 2021-10-11 DIAGNOSIS — Z79899 Other long term (current) drug therapy: Secondary | ICD-10-CM | POA: Diagnosis not present

## 2021-10-11 DIAGNOSIS — E1140 Type 2 diabetes mellitus with diabetic neuropathy, unspecified: Secondary | ICD-10-CM | POA: Diagnosis not present

## 2021-10-11 DIAGNOSIS — E785 Hyperlipidemia, unspecified: Secondary | ICD-10-CM | POA: Diagnosis not present

## 2021-11-13 ENCOUNTER — Other Ambulatory Visit (HOSPITAL_COMMUNITY): Payer: Self-pay | Admitting: Nurse Practitioner

## 2021-11-13 DIAGNOSIS — Z1231 Encounter for screening mammogram for malignant neoplasm of breast: Secondary | ICD-10-CM

## 2021-11-27 ENCOUNTER — Other Ambulatory Visit: Payer: Self-pay

## 2021-11-27 ENCOUNTER — Ambulatory Visit (HOSPITAL_COMMUNITY)
Admission: RE | Admit: 2021-11-27 | Discharge: 2021-11-27 | Disposition: A | Discharge status: Home / Self Care | Payer: Medicaid Other | Source: Ambulatory Visit | Attending: Nurse Practitioner | Admitting: Nurse Practitioner

## 2021-11-27 DIAGNOSIS — Z1231 Encounter for screening mammogram for malignant neoplasm of breast: Secondary | ICD-10-CM

## 2021-12-27 DIAGNOSIS — Z79899 Other long term (current) drug therapy: Secondary | ICD-10-CM | POA: Diagnosis not present

## 2021-12-27 DIAGNOSIS — E785 Hyperlipidemia, unspecified: Secondary | ICD-10-CM | POA: Diagnosis not present

## 2022-01-02 DIAGNOSIS — E785 Hyperlipidemia, unspecified: Secondary | ICD-10-CM | POA: Diagnosis not present

## 2022-01-02 DIAGNOSIS — E1165 Type 2 diabetes mellitus with hyperglycemia: Secondary | ICD-10-CM | POA: Diagnosis not present

## 2022-01-30 DIAGNOSIS — E1140 Type 2 diabetes mellitus with diabetic neuropathy, unspecified: Secondary | ICD-10-CM | POA: Diagnosis not present

## 2022-01-30 DIAGNOSIS — E1165 Type 2 diabetes mellitus with hyperglycemia: Secondary | ICD-10-CM | POA: Diagnosis not present

## 2022-02-27 DIAGNOSIS — L729 Follicular cyst of the skin and subcutaneous tissue, unspecified: Secondary | ICD-10-CM | POA: Diagnosis not present

## 2022-02-27 DIAGNOSIS — L910 Hypertrophic scar: Secondary | ICD-10-CM | POA: Diagnosis not present

## 2022-02-27 DIAGNOSIS — E1165 Type 2 diabetes mellitus with hyperglycemia: Secondary | ICD-10-CM | POA: Diagnosis not present

## 2022-03-18 LAB — HM DIABETES EYE EXAM

## 2022-03-25 DIAGNOSIS — Z13228 Encounter for screening for other metabolic disorders: Secondary | ICD-10-CM | POA: Diagnosis not present

## 2022-03-25 DIAGNOSIS — E1165 Type 2 diabetes mellitus with hyperglycemia: Secondary | ICD-10-CM | POA: Diagnosis not present

## 2022-03-25 DIAGNOSIS — E785 Hyperlipidemia, unspecified: Secondary | ICD-10-CM | POA: Diagnosis not present

## 2022-03-27 DIAGNOSIS — E785 Hyperlipidemia, unspecified: Secondary | ICD-10-CM | POA: Diagnosis not present

## 2022-03-27 DIAGNOSIS — E1165 Type 2 diabetes mellitus with hyperglycemia: Secondary | ICD-10-CM | POA: Diagnosis not present

## 2022-03-27 DIAGNOSIS — Z13228 Encounter for screening for other metabolic disorders: Secondary | ICD-10-CM | POA: Diagnosis not present

## 2022-10-08 ENCOUNTER — Encounter: Payer: Self-pay | Admitting: *Deleted

## 2022-11-07 DIAGNOSIS — I639 Cerebral infarction, unspecified: Secondary | ICD-10-CM

## 2022-11-07 HISTORY — DX: Cerebral infarction, unspecified: I63.9

## 2022-12-01 ENCOUNTER — Emergency Department (HOSPITAL_COMMUNITY): Payer: Medicaid Other

## 2022-12-01 ENCOUNTER — Encounter (HOSPITAL_COMMUNITY): Payer: Self-pay

## 2022-12-01 ENCOUNTER — Observation Stay (HOSPITAL_COMMUNITY)
Admission: EM | Admit: 2022-12-01 | Discharge: 2022-12-02 | Disposition: A | Discharge status: Home / Self Care | Payer: Medicaid Other | Attending: Internal Medicine | Admitting: Internal Medicine

## 2022-12-01 DIAGNOSIS — I63311 Cerebral infarction due to thrombosis of right middle cerebral artery: Principal | ICD-10-CM | POA: Insufficient documentation

## 2022-12-01 DIAGNOSIS — R519 Headache, unspecified: Secondary | ICD-10-CM | POA: Diagnosis not present

## 2022-12-01 DIAGNOSIS — Z7984 Long term (current) use of oral hypoglycemic drugs: Secondary | ICD-10-CM | POA: Insufficient documentation

## 2022-12-01 DIAGNOSIS — I10 Essential (primary) hypertension: Secondary | ICD-10-CM | POA: Diagnosis not present

## 2022-12-01 DIAGNOSIS — J449 Chronic obstructive pulmonary disease, unspecified: Secondary | ICD-10-CM | POA: Insufficient documentation

## 2022-12-01 DIAGNOSIS — R4781 Slurred speech: Secondary | ICD-10-CM | POA: Insufficient documentation

## 2022-12-01 DIAGNOSIS — Z91040 Latex allergy status: Secondary | ICD-10-CM | POA: Insufficient documentation

## 2022-12-01 DIAGNOSIS — E119 Type 2 diabetes mellitus without complications: Secondary | ICD-10-CM | POA: Diagnosis not present

## 2022-12-01 DIAGNOSIS — J45909 Unspecified asthma, uncomplicated: Secondary | ICD-10-CM | POA: Diagnosis not present

## 2022-12-01 DIAGNOSIS — R29702 NIHSS score 2: Secondary | ICD-10-CM | POA: Diagnosis not present

## 2022-12-01 DIAGNOSIS — R1310 Dysphagia, unspecified: Secondary | ICD-10-CM | POA: Insufficient documentation

## 2022-12-01 DIAGNOSIS — Z79899 Other long term (current) drug therapy: Secondary | ICD-10-CM | POA: Diagnosis not present

## 2022-12-01 DIAGNOSIS — I6601 Occlusion and stenosis of right middle cerebral artery: Secondary | ICD-10-CM | POA: Insufficient documentation

## 2022-12-01 DIAGNOSIS — R200 Anesthesia of skin: Secondary | ICD-10-CM

## 2022-12-01 DIAGNOSIS — Z7902 Long term (current) use of antithrombotics/antiplatelets: Secondary | ICD-10-CM | POA: Insufficient documentation

## 2022-12-01 LAB — COMPREHENSIVE METABOLIC PANEL
ALT: 22 U/L (ref 0–44)
AST: 20 U/L (ref 15–41)
Albumin: 4.1 g/dL (ref 3.5–5.0)
Alkaline Phosphatase: 51 U/L (ref 38–126)
Anion gap: 9 (ref 5–15)
BUN: 12 mg/dL (ref 6–20)
CO2: 25 mmol/L (ref 22–32)
Calcium: 9.3 mg/dL (ref 8.9–10.3)
Chloride: 103 mmol/L (ref 98–111)
Creatinine, Ser: 0.72 mg/dL (ref 0.44–1.00)
GFR, Estimated: >60 mL/min (ref >60)
Glucose, Bld: 279 mg/dL — ABNORMAL HIGH (ref 70–99)
Potassium: 3.6 mmol/L (ref 3.5–5.1)
Sodium: 137 mmol/L (ref 135–145)
Total Bilirubin: 1 mg/dL (ref 0.3–1.2)
Total Protein: 8 g/dL (ref 6.5–8.1)

## 2022-12-01 LAB — URINALYSIS, ROUTINE W REFLEX MICROSCOPIC
Bilirubin Urine: NEGATIVE
Glucose, UA: >=500 mg/dL — AB
Hgb urine dipstick: NEGATIVE
Ketones, ur: NEGATIVE mg/dL
Leukocytes,Ua: NEGATIVE
Nitrite: NEGATIVE
Protein, ur: 30 mg/dL — AB
Specific Gravity, Urine: 1.025 (ref 1.005–1.030)
pH: 6 (ref 5.0–8.0)

## 2022-12-01 LAB — RAPID URINE DRUG SCREEN, HOSP PERFORMED
Amphetamines: NOT DETECTED
Barbiturates: NOT DETECTED
Benzodiazepines: NOT DETECTED
Cocaine: NOT DETECTED
Opiates: NOT DETECTED
Tetrahydrocannabinol: NOT DETECTED

## 2022-12-01 LAB — CBG MONITORING, ED: Glucose-Capillary: 273 mg/dL — ABNORMAL HIGH (ref 70–99)

## 2022-12-01 LAB — ETHANOL: Alcohol, Ethyl (B): <10 mg/dL (ref <10)

## 2022-12-01 LAB — DIFFERENTIAL
Abs Immature Granulocytes: 0.01 10*3/uL (ref 0.00–0.07)
Basophils Absolute: 0 10*3/uL (ref 0.0–0.1)
Basophils Relative: 1 %
Eosinophils Absolute: 0.1 10*3/uL (ref 0.0–0.5)
Eosinophils Relative: 2 %
Immature Granulocytes: 0 %
Lymphocytes Relative: 43 %
Lymphs Abs: 2.8 10*3/uL (ref 0.7–4.0)
Monocytes Absolute: 0.4 10*3/uL (ref 0.1–1.0)
Monocytes Relative: 6 %
Neutro Abs: 3.1 10*3/uL (ref 1.7–7.7)
Neutrophils Relative %: 48 %

## 2022-12-01 LAB — PROTIME-INR
INR: 0.9 (ref 0.8–1.2)
Prothrombin Time: 12 seconds (ref 11.4–15.2)

## 2022-12-01 LAB — APTT: aPTT: 26 seconds (ref 24–36)

## 2022-12-01 LAB — CBC
HCT: 39.8 % (ref 36.0–46.0)
Hemoglobin: 13.3 g/dL (ref 12.0–15.0)
MCH: 30.4 pg (ref 26.0–34.0)
MCHC: 33.4 g/dL (ref 30.0–36.0)
MCV: 90.9 fL (ref 80.0–100.0)
Platelets: 356 10*3/uL (ref 150–400)
RBC: 4.38 MIL/uL (ref 3.87–5.11)
RDW: 12.2 % (ref 11.5–15.5)
WBC: 6.4 10*3/uL (ref 4.0–10.5)
nRBC: 0 % (ref 0.0–0.2)

## 2022-12-01 MED ORDER — IOHEXOL 350 MG/ML SOLN
100.0000 mL | Freq: Once | INTRAVENOUS | Status: AC | PRN
  Administered 2022-12-01: 100 mL via INTRAVENOUS

## 2022-12-01 NOTE — ED Provider Notes (Signed)
[facility] EMERGENCY DEPARTMENT Provider Note   CSN: [id number] Arrival date & time: 12/01/22  2048     History  Chief Complaint  Patient presents with   Code Stroke    Kayla Choi is a 56 y.o. female.  HPI 56 year old female with a history of hypertension, hyperlipidemia, diabetes presents with acute left-sided numbness and weakness.  Patient was normal when she left the Christmas gathering around 8 PM.  Daughter reports that she noted that her mom seems to be swerving and driving abnormally.  Patient is noting acute left-sided facial, arm and leg numbness and weakness.  She was having a hard time gripping in her left hand.  She also has a right-sided headache.  No blood thinner use.  No prior history of TIA/stroke.  Home Medications Prior to Admission medications   Medication Sig Start Date End Date Taking? Authorizing Provider  acetaminophen (TYLENOL) 325 MG tablet Take 325 mg by mouth every 6 (six) hours as needed for moderate pain or headache.     [provider]  albuterol (PROVENTIL) (2.5 MG/3ML) 0.083% nebulizer solution Take 3 mLs (2.5 mg total) by nebulization every 4 (four) hours as needed for wheezing or shortness of breath. 04/23/20   Annie Main, FNP  albuterol (VENTOLIN HFA) 108 (90 Base) MCG/ACT inhaler Inhale 2 puffs into the lungs every 4 (four) hours as needed for wheezing or shortness of breath. 03/23/20   Alycia Rossetti, MD  CRANBERRY PO Take 2 tablets by mouth every other day.    [provider]  dapagliflozin propanediol (FARXIGA) 10 MG TABS tablet Take 10 mg by mouth daily before breakfast. 03/30/20   [place], Modena Nunnery, MD  Dulaglutide (TRULICITY) 0.27 OZ/3.6UY [facility] INJECT THE CONTENTS OF 1 PEN INTO THE SKIN ONCE WEEKLY. 06/18/21   Susy Frizzle, MD  glipiZIDE (GLUCOTROL) 10 MG tablet Take 1 tablet (10 mg total) by mouth 2 (two) times daily with a meal. 06/18/21   Susy Frizzle, MD  lisinopril-hydrochlorothiazide (ZESTORETIC)  20-12.5 MG tablet TAKE ONE TABLET BY MOUTH ONCE DAILY. 01/11/21   Alycia Rossetti, MD  loratadine (CLARITIN) 10 MG tablet Take 1 tablet (10 mg total) by mouth daily. 04/23/20   Annie Main, FNP  Phenyleph-Doxylamine-DM-APAP (ALKA SELTZER PLUS PO) Take 2 tablets by mouth daily as needed (congestion).    [provider]  rosuvastatin (CRESTOR) 20 MG tablet Take 1 tablet (20 mg total) by mouth at bedtime. 03/29/20   Alycia Rossetti, MD  Tiotropium Bromide Monohydrate (SPIRIVA RESPIMAT) 1.25 MCG/ACT AERS Inhale 2 puffs into the lungs daily. 03/23/20   Alycia Rossetti, MD      Allergies    Aspirin, Metformin and related, Other, Oxycodone, Latex, and Neosporin [neomycin-bacitracin zn-polymyx]    Review of Systems   Review of Systems  Neurological:  Positive for weakness, numbness and headaches.    Physical Exam Updated Vital Signs BP (!) 168/87   Pulse (!) 21   Temp 98.3 F (36.8 C)   Resp 18   SpO2 99%  Physical Exam Vitals and nursing note reviewed.  Constitutional:      Appearance: She is well-developed.  HENT:     Head: Normocephalic and atraumatic.  Eyes:     Extraocular Movements: Extraocular movements intact.     Pupils: Pupils are equal, round, and reactive to light.  Cardiovascular:     Rate and Rhythm: Regular rhythm. Tachycardia present.     Heart sounds: Normal heart sounds.  Pulmonary:  Effort: Pulmonary effort is normal.     Breath sounds: Normal breath sounds.  Abdominal:     Palpations: Abdomen is soft.     Tenderness: There is no abdominal tenderness.  Skin:    General: Skin is warm and dry.  Neurological:     Mental Status: She is alert.     Comments: No facial droop. No slurred speech. No obvious visual field deficit. She has diffuse subjective decrease sensation to the left face, left arm, left leg.  Left leg might be minimally weak compared to the right but it is inconsistent.  Left arm seems to have equal strength compared to the right.   No pronator drift on my exam.     ED Results / Procedures / Treatments   Labs (all labs ordered are listed, but only abnormal results are displayed) Labs Reviewed  COMPREHENSIVE METABOLIC PANEL - Abnormal; Notable for the following components:      Result Value   Glucose, Bld 279 (*)    All other components within normal limits  CBG MONITORING, ED - Abnormal; Notable for the following components:   Glucose-Capillary 273 (*)    All other components within normal limits  ETHANOL  CBC  DIFFERENTIAL  PROTIME-INR  APTT  RAPID URINE DRUG SCREEN, HOSP PERFORMED  URINALYSIS, ROUTINE W REFLEX MICROSCOPIC  I-STAT CHEM 8, ED  POC URINE PREG, ED    ED ECG REPORT   Date: 12/01/2022  Rate: 102  Rhythm: sinus tachycardia  QRS Axis: left  Intervals: normal  ST/T Wave abnormalities: nonspecific T wave changes  Conduction Disutrbances:none  Narrative Interpretation:   Old EKG Reviewed: unchanged  I have personally reviewed the EKG tracing and agree with the computerized printout as noted.   Radiology CT HEAD CODE STROKE WO CONTRAST  Result Date: 12/01/2022 CLINICAL DATA:  Code stroke. Initial evaluation for neuro deficit, stroke suspected. EXAM: CT HEAD WITHOUT CONTRAST TECHNIQUE: Contiguous axial images were obtained from the base of the skull through the vertex without intravenous contrast. RADIATION DOSE REDUCTION: This exam was performed according to the departmental dose-optimization program which includes automated exposure control, adjustment of the mA and/or kV according to patient size and/or use of iterative reconstruction technique. COMPARISON:  Prior CT from 07/27/2009. FINDINGS: Brain: Cerebral volume within normal limits. No acute intracranial hemorrhage. Subcentimeter hypodensities at the inferior basal ganglia noted, similar to prior, and favored to reflect small dilated perivascular spaces. No acute cortically based infarct. No mass lesion or midline shift. No hydrocephalus  or extra-axial fluid collection. Vascular: No visible hyperdense vessel. Skull: Scalp soft tissues and calvarium within normal limits. Sinuses/Orbits: Right gaze noted. Paranasal sinuses and mastoid air cells are clear. Other: None. ASPECTS [facility] Stroke Program Early CT Score) - Ganglionic level infarction (caudate, lentiform nuclei, internal capsule, insula, M1-M3 cortex): 7 - Supraganglionic infarction (M4-M6 cortex): 3 Total score (0-10 with 10 being normal): 10 IMPRESSION: 1. No acute intracranial abnormality. 2. ASPECTS is 10. 3. Right gaze preference. Critical Value/emergent results were called by telephone at the time of interpretation on 12/01/2022 at 9:20 pm to provider Sherwood Gambler , who verbally acknowledged these results. Electronically Signed   By: Jeannine Boga M.D.   On: 12/01/2022 21:22    Procedures .Critical Care  Performed by: Sherwood Gambler, MD Authorized by: Sherwood Gambler, MD   Critical care provider statement:    Critical care time (minutes):  30   Critical care time was exclusive of:  Separately billable procedures and treating other patients  Critical care was necessary to treat or prevent imminent or life-threatening deterioration of the following conditions:  CNS failure or compromise   Critical care was time spent personally by me on the following activities:  Development of treatment plan with patient or surrogate, discussions with consultants, evaluation of patient's response to treatment, examination of patient, ordering and review of laboratory studies, ordering and review of radiographic studies, ordering and performing treatments and interventions, pulse oximetry, re-evaluation of patient's condition and review of old charts     Medications Ordered in ED Medications  iohexol (OMNIPAQUE) 350 MG/ML injection 100 mL (100 mLs Intravenous Contrast Given 12/01/22 2213)    ED Course/ Medical Decision Making/ A&P                           Medical  Decision Making Amount and/or Complexity of Data Reviewed Labs: ordered.    Details: Hyperglycemia but no acidosis.  No significant electrolyte disturbance. Radiology: ordered and independent interpretation performed.    Details: CT head without head bleed. ECG/medicine tests: ordered and independent interpretation performed.    Details: Mild sinus tachycardia.   Patient was made a code stroke on arrival.  CT head is unremarkable.  Exam is mostly associated with numbness and after discussion with teleneurology, Dr. Geanie Kenning, decision was made to hold off on thrombolytics.  However she would like them to get a CTA and CT perfusion.  If this is positive may need thrombectomy but otherwise would need to get MRI tomorrow.  She is not altered so I doubt PRES.  For now we will leave with permissive hypertension. Discussed with Dr. Clearence Ped for admission.        Final Clinical Impression(s) / ED Diagnoses Final diagnoses:  Left sided numbness    Rx / DC Orders ED Discharge Orders     None         Sherwood Gambler, MD 12/01/22 [phone]

## 2022-12-01 NOTE — Consult Note (Addendum)
[facility] TeleNeurology Consult Services   Patient Name:   Kayla Choi, Kayla Choi Date of Birth:   1966-02-03 Identification Number:   MRN - [id number] Date of Service:   12/01/2022 21:20:19  Diagnosis:       R51.9 - Headache, unspecified       R20.2 - Paresthesia of skin  Impression:      56 y/o woman presenting with headache ongoing all day, followed by new onset of left sided numbness and possibly weakness around 20:00 today. On my exam she has no focal deficits aside from left-sided numbness, which is atypical in that patient states she is completely unable to feel touch on her left face and arm as opposed to just reduced sensation. DDX includes stroke, hypertensive urgency, complex migraine. Given no motor deficits at this time, and atypical headache ongoing all day which suggests LKW time would actually be last night, risk of TNK outweighs potential benefit. CTA/P pending to rule out LVO, will follow up results. Assuming no LVO, would plan to admit for brain MRI to evaluate for stroke. Maintain permissive HTN to 220/120 pending MRI results.  (Addendum: CTA head/neck completed, no LVO)  Our recommendations are outlined below.  Recommendations:        Stroke/Telemetry Floor       Neuro Checks       Bedside Swallow Eval       DVT Prophylaxis       IV Fluids, Normal Saline       Head of Bed 30 Degrees       Euglycemia and Avoid Hyperthermia (PRN Acetaminophen)       Initiate or continue Aspirin 325 MG daily       Antihypertensives PRN if Blood pressure is greater than 220/120 or there is a concern for End organ damage/contraindications for permissive HTN. If blood pressure is greater than 220/120 give labetalol PO or IV or Vasotec IV with a goal of 15% reduction in BP during the first 24 hours.       Brain MRI w/o contrast  Sign Out:       Discussed with Emergency Department  Provider    ------------------------------------------------------------------------------  Advanced Imaging: Advanced imaging has been ordered. Results pending.   Metrics: Last Known Well: 11/30/2022 22:00:00 [facility] Notification Time: 12/01/2022 21:20:19 Arrival Time: 12/01/2022 20:48:00 Stamp Time: 12/01/2022 21:20:19 Initial Response Time: 12/01/2022 21:22:45 Symptoms: left-sided numbness/weakness, headache. Initial patient interaction: 12/01/2022 21:26:54 NIHSS Assessment Completed: 12/01/2022 21:34:34 Patient is not a candidate for Thrombolytic. Thrombolytic Medical Decision: 12/01/2022 21:34:34 Patient was not deemed candidate for Thrombolytic because of following reasons: Last Well Known Above 4.5 Hours.  CT head showed no acute hemorrhage or acute core infarct.  Primary Provider Notified of Diagnostic Impression and Management Plan on: 12/01/2022 21:56:17    ------------------------------------------------------------------------------  History of Present Illness: Patient is a 56 year old Female.  Patient was brought by private transportation with symptoms of left-sided numbness/weakness, headache. Patient was brought in by family after they saw her swerving her car as she was driving. She reports that she woke up today with a right frontal headache that was very unusual for her, this persisted all day despite taking Tylenol, then this evening while driving her left side went numb and also had blurry vision in the left eye. Unclear if she had weakness in the left side at any point or difficulty using it due to numbness. BP was over 200 in the ED and patient says it has been 190s/120s lately at home.  Past Medical History:      Hypertension      Diabetes Mellitus      Hyperlipidemia  Medications:  No Anticoagulant use  No Antiplatelet use Reviewed EMR for current medications  Allergies:  Reviewed  Social History: Smoking: No  Past Surgical  History: There Is No Surgical History Contributory To Today's Visit     Examination: BP(203/110), Pulse(119), Blood Glucose(277) 1A: Level of Consciousness - Alert; keenly responsive + 0 1B: Ask Month and Age - Both Questions Right + 0 1C: Blink Eyes & Squeeze Hands - Performs Both Tasks + 0 2: Test Horizontal Extraocular Movements - Normal + 0 3: Test Visual Fields - No Visual Loss + 0 4: Test Facial Palsy (Use Grimace if Obtunded) - Normal symmetry + 0 5A: Test Left Arm Motor Drift - No Drift for 10 Seconds + 0 5B: Test Right Arm Motor Drift - No Drift for 10 Seconds + 0 6A: Test Left Leg Motor Drift - No Drift for 5 Seconds + 0 6B: Test Right Leg Motor Drift - No Drift for 5 Seconds + 0 7: Test Limb Ataxia (FNF/Heel-Shin) - No Ataxia + 0 8: Test Sensation - Complete Loss: Cannot Sense Being Touched At All + 2 9: Test Language/Aphasia - Normal; No aphasia + 0 10: Test Dysarthria - Normal + 0 11: Test Extinction/Inattention - No abnormality + 0  NIHSS Score: 2   Pre-Morbid Modified Rankin Scale: 0 Points = No symptoms at all  Spoke with : Dr. Regenia Skeeter  Patient/Family was informed the Neurology Consult would occur via TeleHealth consult by way of interactive audio and video telecommunications and consented to receiving care in this manner.   Patient is being evaluated for possible acute neurologic impairment and high probability of imminent or life-threatening deterioration. I spent total of 35 minutes providing care to this patient, including time for face to face visit via telemedicine, review of medical records, imaging studies and discussion of findings with providers, the patient and/or family.   Dr Eleonore Chiquito   [facility] For Inpatient follow-up with [facility] physician please call [facility] [phone]. This is not an outpatient service. Post hospital discharge, please contact hospital directly.  Please do not communicate with [facility] physicians via  secure chat. If you have any questions, Please contact [facility].

## 2022-12-01 NOTE — ED Triage Notes (Signed)
Pt directly to ED room 3. Code Stroke was activated and EDP at bedside.   Pt's LKW 08:00 when she got into a car to drive home from a holiday party. Pt's daughter was following and noted pt started swerving. Pt with sudden onset of L arm weakness, numbness, and difficulty speaking.

## 2022-12-01 NOTE — ED Notes (Signed)
[place] paged out @ 2058

## 2022-12-01 NOTE — Consult Note (Signed)
Telestroke cart was activated at 2057. Per treatment team, pt's LKW was at 2000 with c/o L sided weakness, slurred speech and HA. EDP provider assessed pt prior to cart activation. Pt transported to CT at 2106 and returned to room at 2120. [facility] was paged for code stroke at 2120. Dr, Geanie Kenning, [facility] appeared on telestroke cart at 2122 to assess the patient. Based on [facility]'s assessment, pt does not meet criteria for emergent interventions at this time 2/2 Last Well Known Above 4.5 Hours. [facility] to f/u with EDP regarding recommendations. No further needs from Telestroke RN at this time. Telestroke cart disconnected at 2135.

## 2022-12-01 NOTE — ED Triage Notes (Signed)
Pt to CT at this time with Benjamine Mola, [place]

## 2022-12-02 ENCOUNTER — Observation Stay (HOSPITAL_BASED_OUTPATIENT_CLINIC_OR_DEPARTMENT_OTHER): Payer: Medicaid Other

## 2022-12-02 ENCOUNTER — Other Ambulatory Visit: Payer: Self-pay

## 2022-12-02 ENCOUNTER — Observation Stay (HOSPITAL_COMMUNITY): Payer: Medicaid Other

## 2022-12-02 DIAGNOSIS — I6389 Other cerebral infarction: Secondary | ICD-10-CM

## 2022-12-02 DIAGNOSIS — R200 Anesthesia of skin: Secondary | ICD-10-CM

## 2022-12-02 DIAGNOSIS — I10 Essential (primary) hypertension: Secondary | ICD-10-CM

## 2022-12-02 DIAGNOSIS — E1169 Type 2 diabetes mellitus with other specified complication: Secondary | ICD-10-CM

## 2022-12-02 DIAGNOSIS — I639 Cerebral infarction, unspecified: Secondary | ICD-10-CM

## 2022-12-02 DIAGNOSIS — R519 Headache, unspecified: Secondary | ICD-10-CM

## 2022-12-02 LAB — COMPREHENSIVE METABOLIC PANEL
ALT: 21 U/L (ref 0–44)
AST: 19 U/L (ref 15–41)
Albumin: 3.4 g/dL — ABNORMAL LOW (ref 3.5–5.0)
Alkaline Phosphatase: 41 U/L (ref 38–126)
Anion gap: 5 (ref 5–15)
BUN: 9 mg/dL (ref 6–20)
CO2: 27 mmol/L (ref 22–32)
Calcium: 8.8 mg/dL — ABNORMAL LOW (ref 8.9–10.3)
Chloride: 105 mmol/L (ref 98–111)
Creatinine, Ser: 0.57 mg/dL (ref 0.44–1.00)
GFR, Estimated: >60 mL/min (ref >60)
Glucose, Bld: 275 mg/dL — ABNORMAL HIGH (ref 70–99)
Potassium: 3.7 mmol/L (ref 3.5–5.1)
Sodium: 137 mmol/L (ref 135–145)
Total Bilirubin: 1.2 mg/dL (ref 0.3–1.2)
Total Protein: 6.6 g/dL (ref 6.5–8.1)

## 2022-12-02 LAB — CBC
HCT: 37.6 % (ref 36.0–46.0)
Hemoglobin: 12.5 g/dL (ref 12.0–15.0)
MCH: 30.3 pg (ref 26.0–34.0)
MCHC: 33.2 g/dL (ref 30.0–36.0)
MCV: 91.3 fL (ref 80.0–100.0)
Platelets: 312 10*3/uL (ref 150–400)
RBC: 4.12 MIL/uL (ref 3.87–5.11)
RDW: 12.2 % (ref 11.5–15.5)
WBC: 6 10*3/uL (ref 4.0–10.5)
nRBC: 0 % (ref 0.0–0.2)

## 2022-12-02 LAB — ECHOCARDIOGRAM COMPLETE
AR max vel: 2.41 cm2
AV Area VTI: 2.54 cm2
AV Area mean vel: 2.25 cm2
AV Mean grad: 3 mmHg
AV Peak grad: 5.1 mmHg
Ao pk vel: 1.13 m/s
Area-P 1/2: 3.01 cm2
Height: 61 in
MV VTI: 2.42 cm2
S' Lateral: 2.6 cm
Weight: 2832.47 oz

## 2022-12-02 LAB — GLUCOSE, RANDOM: Glucose, Bld: 254 mg/dL — ABNORMAL HIGH (ref 70–99)

## 2022-12-02 LAB — GLUCOSE, CAPILLARY
Glucose-Capillary: >600 mg/dL (ref 70–99)
Glucose-Capillary: 70 mg/dL (ref 70–99)

## 2022-12-02 LAB — LIPID PANEL
Cholesterol: 214 mg/dL — ABNORMAL HIGH (ref 0–200)
HDL: 58 mg/dL (ref >40)
LDL Cholesterol: 148 mg/dL — ABNORMAL HIGH (ref 0–99)
Total CHOL/HDL Ratio: 3.7 RATIO
Triglycerides: 42 mg/dL (ref <150)
VLDL: 8 mg/dL (ref 0–40)

## 2022-12-02 LAB — MAGNESIUM: Magnesium: 1.7 mg/dL (ref 1.7–2.4)

## 2022-12-02 LAB — CBG MONITORING, ED: Glucose-Capillary: 277 mg/dL — ABNORMAL HIGH (ref 70–99)

## 2022-12-02 LAB — HIV ANTIBODY (ROUTINE TESTING W REFLEX): HIV Screen 4th Generation wRfx: NONREACTIVE

## 2022-12-02 MED ORDER — DIPHENHYDRAMINE HCL 50 MG/ML IJ SOLN
25.0000 mg | Freq: Once | INTRAMUSCULAR | Status: AC
  Administered 2022-12-02: 25 mg via INTRAVENOUS
  Filled 2022-12-02: qty 1

## 2022-12-02 MED ORDER — CLOPIDOGREL BISULFATE 75 MG PO TABS: 75.0000 mg | ORAL_TABLET | Freq: Every day | ORAL | 2 refills | Status: AC

## 2022-12-02 MED ORDER — INSULIN ASPART 100 UNIT/ML IJ SOLN
0.0000 [IU] | INTRAMUSCULAR | Status: DC
  Administered 2022-12-02: 8 [IU] via SUBCUTANEOUS

## 2022-12-02 MED ORDER — INSULIN ASPART 100 UNIT/ML IJ SOLN: 0.0000 [IU] | Freq: Three times a day (TID) | INTRAMUSCULAR | Status: DC

## 2022-12-02 MED ORDER — KETOROLAC TROMETHAMINE 15 MG/ML IJ SOLN
15.0000 mg | Freq: Once | INTRAMUSCULAR | Status: AC
  Administered 2022-12-02: 15 mg via INTRAVENOUS
  Filled 2022-12-02: qty 1

## 2022-12-02 MED ORDER — ACETAMINOPHEN 650 MG RE SUPP: 650.0000 mg | RECTAL | Status: DC | PRN

## 2022-12-02 MED ORDER — SENNOSIDES-DOCUSATE SODIUM 8.6-50 MG PO TABS: 1.0000 | ORAL_TABLET | Freq: Every evening | ORAL | Status: DC | PRN

## 2022-12-02 MED ORDER — INSULIN ASPART 100 UNIT/ML IJ SOLN: 0.0000 [IU] | Freq: Every day | INTRAMUSCULAR | Status: DC

## 2022-12-02 MED ORDER — ALBUTEROL SULFATE (2.5 MG/3ML) 0.083% IN NEBU: 2.5000 mg | INHALATION_SOLUTION | RESPIRATORY_TRACT | Status: DC | PRN

## 2022-12-02 MED ORDER — STROKE: EARLY STAGES OF RECOVERY BOOK
Freq: Once | Status: DC
  Filled 2022-12-02: qty 1

## 2022-12-02 MED ORDER — ROSUVASTATIN CALCIUM 20 MG PO TABS: 20.0000 mg | ORAL_TABLET | Freq: Every day | ORAL | Status: DC

## 2022-12-02 MED ORDER — HEPARIN SODIUM (PORCINE) 5000 UNIT/ML IJ SOLN
5000.0000 [IU] | Freq: Three times a day (TID) | INTRAMUSCULAR | Status: DC
  Administered 2022-12-02 (×2): 5000 [IU] via SUBCUTANEOUS
  Filled 2022-12-02 (×2): qty 1

## 2022-12-02 MED ORDER — ACETAMINOPHEN 160 MG/5ML PO SOLN: 650.0000 mg | ORAL | Status: DC | PRN

## 2022-12-02 MED ORDER — ACETAMINOPHEN 325 MG PO TABS
650.0000 mg | ORAL_TABLET | ORAL | Status: DC | PRN
  Administered 2022-12-02: 650 mg via ORAL
  Filled 2022-12-02: qty 2

## 2022-12-02 MED ORDER — CLOPIDOGREL BISULFATE 75 MG PO TABS
75.0000 mg | ORAL_TABLET | Freq: Every day | ORAL | Status: DC
  Administered 2022-12-02: 75 mg via ORAL
  Filled 2022-12-02: qty 1

## 2022-12-02 MED ORDER — METOCLOPRAMIDE HCL 5 MG/ML IJ SOLN
10.0000 mg | Freq: Once | INTRAMUSCULAR | Status: AC
  Administered 2022-12-02: 10 mg via INTRAVENOUS
  Filled 2022-12-02: qty 2

## 2022-12-02 MED ORDER — TRAMADOL HCL 50 MG PO TABS
100.0000 mg | ORAL_TABLET | Freq: Four times a day (QID) | ORAL | Status: DC | PRN
  Administered 2022-12-02: 100 mg via ORAL
  Filled 2022-12-02: qty 2

## 2022-12-02 NOTE — Evaluation (Signed)
Physical Therapy Evaluation Patient Details Name: Kayla Choi MRN: [id number] DOB: 1966/03/11 Today's Date: 12/02/2022  History of Present Illness  Kayla Choi is a 56 y.o. female with medical history significant of acid reflux, anxiety, COPD, depression, diabetes mellitus type 2, hyperlipidemia, hypertension, and more presents ED with a chief complaint of left-sided numbness.  It started at around 8 PM.  Patient was apparently driving home when it started.  She reports that she had associated blurry vision, slurred speech, dysphagia.  Her daughter noticed her left facial droop.  Patient reports that she had weakness in her left upper extremity, and dropped her purse.  She had difficulty with ambulation due to dizziness.  She had numbness in her face as well.  Patient reports that most of the weakness is gone, but the numbness is still present.  Patient did swallow study in the ED and choked.  She reports she has an associated headache.  It is a frontal headache and she describes it as pressure.  Patient reports that she has had a headache like this before.  She also reports that she had chest pain earlier in the day it was lateral of the left midclavicular line.  She associated palpitations.  She reports the chest pain felt like burning.  On review of systems patient reports that she has had constipation.  Her last bowel movement was yesterday.  Patient has no other complaints at this time.   Clinical Impression  Patient appears to having decreased visual field left eye with occasional bumping into objects on the left during gait training, no loss of balance, had to walk slower, but did not require use of AD.  Patient demonstrates good return for transferring to/from commode in bathroom and washing hands standing over sink and tolerated sitting up in chair after therapy.  Patient will benefit from continued skilled physical therapy in hospital and recommended venue below to increase strength,  balance, endurance for safe ADLs and gait.        Recommendations for follow up therapy are one component of a multi-disciplinary discharge planning process, led by the attending physician.  Recommendations may be updated based on patient status, additional functional criteria and insurance authorization.  Follow Up Recommendations Outpatient PT      Assistance Recommended at Discharge Set up Supervision/Assistance  Patient can return home with the following  A little help with walking and/or transfers;A little help with bathing/dressing/bathroom;Help with stairs or ramp for entrance;Assistance with cooking/housework    Equipment Recommendations None recommended by PT  Recommendations for Other Services       Functional Status Assessment Patient has had a recent decline in their functional status and demonstrates the ability to make significant improvements in function in a reasonable and predictable amount of time.     Precautions / Restrictions Precautions Precautions: Fall Restrictions Weight Bearing Restrictions: No      Mobility  Bed Mobility Overal bed mobility: Modified Independent                  Transfers Overall transfer level: Needs assistance Equipment used: None Transfers: Sit to/from Stand, Bed to chair/wheelchair/BSC Sit to Stand: Min guard   Step pivot transfers: Min guard       General transfer comment: increased time, labored movement    Ambulation/Gait Ambulation/Gait assistance: Supervision, Min guard Gait Distance (Feet): 80 Feet Assistive device: None Gait Pattern/deviations: Decreased step length - left, Decreased stance time - right, Decreased stride length Gait velocity: decreased  General Gait Details: sllightly labored slow cadence with occasional drifting to the left possibly due to decreased vision left eye, no loss of balance and limited mostly due to fatigue  Stairs            Wheelchair Mobility    Modified  Rankin (Stroke Patients Only)       Balance Overall balance assessment: Needs assistance Sitting-balance support: Feet supported, No upper extremity supported Sitting balance-Leahy Scale: Good Sitting balance - Comments: seated EOB   Standing balance support: During functional activity, No upper extremity supported Standing balance-Leahy Scale: Fair Standing balance comment: fair/good without AD                             Pertinent Vitals/Pain Pain Assessment Pain Assessment: No/denies pain    Home Living Family/patient expects to be discharged to:: Private residence Living Arrangements: Alone Available Help at Discharge: Other (Comment);Family;Available PRN/intermittently Type of Home: House Home Access: Ramped entrance       Home Layout: One level Home Equipment: None      Prior Function Prior Level of Function : Independent/Modified Independent             Mobility Comments: Hydrographic surveyor without AD ADLs Comments: Indepndent; works     Journalist, newspaper   Dominant Hand: Right    Extremity/Trunk Assessment   Upper Extremity Assessment Upper Extremity Assessment: Defer to OT evaluation    Lower Extremity Assessment Lower Extremity Assessment: Overall WFL for tasks assessed;LLE deficits/detail LLE Deficits / Details: grossly 4/5 LLE Sensation: decreased light touch LLE Coordination: WNL    Cervical / Trunk Assessment Cervical / Trunk Assessment: Normal  Communication   Communication: No difficulties  Cognition Arousal/Alertness: Awake/alert Behavior During Therapy: WFL for tasks assessed/performed Overall Cognitive Status: Within Functional Limits for tasks assessed                                          General Comments      Exercises     Assessment/Plan    PT Assessment Patient needs continued PT services  PT Problem List Decreased strength;Decreased activity tolerance;Decreased balance;Decreased  mobility       PT Treatment Interventions Functional mobility training;Stair training;Gait training;DME instruction;Therapeutic activities;Therapeutic exercise;Patient/family education;Balance training    PT Goals (Current goals can be found in the Care Plan section)  Acute Rehab PT Goals Patient Stated Goal: return home with family to assist PT Goal Formulation: With patient Time For Goal Achievement: 12/05/22 Potential to Achieve Goals: Good    Frequency Min 3X/week     Co-evaluation PT/OT/SLP Co-Evaluation/Treatment: Yes Reason for Co-Treatment: To address functional/ADL transfers PT goals addressed during session: Mobility/safety with mobility;Balance;Proper use of DME         AM-PAC PT "6 Clicks" Mobility  Outcome Measure Help needed turning from your back to your side while in a flat bed without using bedrails?: None Help needed moving from lying on your back to sitting on the side of a flat bed without using bedrails?: None Help needed moving to and from a bed to a chair (including a wheelchair)?: A Little Help needed standing up from a chair using your arms (e.g., wheelchair or bedside chair)?: A Little Help needed to walk in hospital room?: A Little Help needed climbing 3-5 steps with a railing? : A Little 6 Click Score: 20  End of Session   Activity Tolerance: Patient tolerated treatment well;Patient limited by fatigue Patient left: in chair;with call bell/phone within reach Nurse Communication: Mobility status PT Visit Diagnosis: Unsteadiness on feet (R26.81);Other abnormalities of gait and mobility (R26.89);Muscle weakness (generalized) (M62.81)    Time: 6067-7034 PT Time Calculation (min) (ACUTE ONLY): 30 min   Charges:   PT Evaluation $PT Eval Moderate Complexity: 1 Mod PT Treatments $Therapeutic Activity: 23-37 mins        2:06 PM, 12/02/22 Lonell Grandchild, MPT Physical Therapist with [facility] [phone] office [phone]  mobile phone

## 2022-12-02 NOTE — ED Notes (Signed)
Pt passed original swallow screen, but then choked when swallowing pills and states that she was having a hard time swallowign. Called MD. New diet order for NPO until specified.

## 2022-12-02 NOTE — Assessment & Plan Note (Signed)
-   Frontal headache described as pressure - Tylenol did not help the pain - Adding Toradol (patient takes NSAID naproxen at home), Benadryl, Reglan for headache cocktail

## 2022-12-02 NOTE — Assessment & Plan Note (Signed)
-   Continue sliding scale coverage

## 2022-12-02 NOTE — Inpatient Diabetes Management (Signed)
Inpatient Diabetes Program Recommendations  AACE/ADA: New Consensus Statement on Inpatient Glycemic Control (2015)  Target Ranges:  Prepandial:   less than 140 mg/dL      Peak postprandial:   less than 180 mg/dL (1-2 hours)      Critically ill patients:  140 - 180 mg/dL   Lab Results  Component Value Date   GLUCAP >600 (HH) 12/02/2022   HGBA1C 10.2 (H) 12/26/2020    Review of Glycemic Control  Latest Reference Range & Units 12/01/22 21:28 12/02/22 05:10 12/02/22 08:01  Glucose 70 - 99 mg/dL 279 (H) 275 (H) 254 (H)   Diabetes history: DM 2 Outpatient Diabetes medications: Farxiga 10 mg Daily, Tru;icity 0.75 mg weekly, Glipizide 10 mg bid Current orders for Inpatient glycemic control:  Novolog 0-15 units Q4 hours  Inpatient Diabetes Program Recommendations:    Glucose trends in mid 200 range.  -  Add Semglee 12 units  Thanks,  Tama Headings RN, MSN, BC-ADM Inpatient Diabetes Coordinator Team Pager [phone] (8a-5p)

## 2022-12-02 NOTE — Evaluation (Signed)
Occupational Therapy Evaluation Patient Details Name: Kayla Choi MRN: [id number] DOB: [birth year] Today's Date: 12/02/2022   History of Present Illness Kayla Choi is a 56 y.o. female with medical history significant of acid reflux, anxiety, COPD, depression, diabetes mellitus type 2, hyperlipidemia, hypertension, and more presents ED with a chief complaint of left-sided numbness.  It started at around 8 PM.  Patient was apparently driving home when it started.  She reports that she had associated blurry vision, slurred speech, dysphagia.  Her daughter noticed her left facial droop.  Patient reports that she had weakness in her left upper extremity, and dropped her purse.  She had difficulty with ambulation due to dizziness.  She had numbness in her face as well.  Patient reports that most of the weakness is gone, but the numbness is still present.  Patient did swallow study in the ED and choked.  She reports she has an associated headache.  It is a frontal headache and she describes it as pressure.  Patient reports that she has had a headache like this before.  She also reports that she had chest pain earlier in the day it was lateral of the left midclavicular line.  She associated palpitations.  She reports the chest pain felt like burning.  On review of systems patient reports that she has had constipation.  Her last bowel movement was yesterday.  Patient has no other complaints at this time. (per DO)   Clinical Impression   Pt agreeable to OT and PT co-evaluation. Pt reports being independent at baseline, driving, and working. Today pt presented with L UE weakness and decreased sensation. Pt is also presenting with L visual field deficit observed during vision testing as well as during ambulation when pt was noted to run into objects on her L side. Pt was informed that she should not drive. Home health vs outpatient was recommended based on pt's comfort and availability for transportation. Pt  will benefit from continued OT in the hospital and recommended venue below to increase strength, balance, and endurance for safe ADL's.         Recommendations for follow up therapy are one component of a multi-disciplinary discharge planning process, led by the attending physician.  Recommendations may be updated based on patient status, additional functional criteria and insurance authorization.   Follow Up Recommendations  Home health OT (Home health vs outpatient depending on pt's comfort and available transportation.)     Assistance Recommended at Discharge Set up Supervision/Assistance  Patient can return home with the following A little help with walking and/or transfers;A little help with bathing/dressing/bathroom;Assistance with cooking/housework;Assist for transportation;Help with stairs or ramp for entrance    Functional Status Assessment  Patient has had a recent decline in their functional status and demonstrates the ability to make significant improvements in function in a reasonable and predictable amount of time.  Equipment Recommendations  None recommended by OT    Recommendations for Other Services Other (comment) (Evaluation from neuro vision specialist.)     Precautions / Restrictions Precautions Precautions: Fall Restrictions Weight Bearing Restrictions: No      Mobility Bed Mobility Overal bed mobility: Modified Independent                  Transfers Overall transfer level: Needs assistance Equipment used: None Transfers: Sit to/from Stand, Bed to chair/wheelchair/BSC Sit to Stand: Min guard     Step pivot transfers: Min guard     General transfer comment: Min G assist for transfers  to and from chair due to instability and vision deficits.      Balance Overall balance assessment: Needs assistance Sitting-balance support: No upper extremity supported, Feet supported Sitting balance-Leahy Scale: Good Sitting balance - Comments: seated  EOB   Standing balance support: No upper extremity supported Standing balance-Leahy Scale: Fair Standing balance comment: fair to good without AD                           ADL either performed or assessed with clinical judgement   ADL Overall ADL's : Needs assistance/impaired     Grooming: Supervision/safety;Min guard;Standing;Wash/dry hands Grooming Details (indicate cue type and reason): Completed standing at the sink. Upper Body Bathing: Set up;Sitting   Lower Body Bathing: Set up;Sitting/lateral leans   Upper Body Dressing : Set up;Sitting   Lower Body Dressing: Set up;Sitting/lateral leans   Toilet Transfer: Min guard;Ambulation Toilet Transfer Details (indicate cue type and reason): Pt ambulated to toilet and back to chair but unsteady with tendency to lean or touch wall to L side. Toileting- Clothing Manipulation and Hygiene: Modified independent;Sitting/lateral lean       Functional mobility during ADLs: Min guard General ADL Comments: Pt able to ambulate in hall but was noted to run into objects in L field of view at times. Unteady with slow gait.     Vision Baseline Vision/History: 1 Wears glasses Ability to See in Adequate Light: 0 Adequate Patient Visual Report: Blurring of vision;Other (comment) (L eye blurry vision.) Vision Assessment?: Yes Ocular Range of Motion: Within Functional Limits Tracking/Visual Pursuits: Able to track stimulus in all quads without difficulty Convergence: Impaired (comment) (no convergence noted) Visual Fields: Left visual field deficit (Upper and lower quadrant deficit.)     Perception Perception Perception Tested?: No   Praxis Praxis Praxis tested?: Not tested    Pertinent Vitals/Pain Pain Assessment Pain Assessment: No/denies pain     Hand Dominance Right   Extremity/Trunk Assessment Upper Extremity Assessment Upper Extremity Assessment: LUE deficits/detail LUE Deficits / Details: 3+/5 shoulder flexion;  4-/5 elbow extension, 4+/5 elbow flexion, wrist extension 4/5, wrist flexion 4/5, grip 3+/5. LUE Sensation: decreased light touch LUE Coordination: WNL   Lower Extremity Assessment Lower Extremity Assessment: Defer to PT evaluation   Cervical / Trunk Assessment Cervical / Trunk Assessment: Normal   Communication Communication Communication: No difficulties   Cognition Arousal/Alertness: Awake/alert Behavior During Therapy: Flat affect Overall Cognitive Status: Within Functional Limits for tasks assessed                                                        Home Living Family/patient expects to be discharged to:: Private residence Living Arrangements: Alone Available Help at Discharge: Other (Comment);Family;Available PRN/intermittently ([place]) Type of Home: House Home Access: Ramped entrance     Home Layout: One level     Bathroom Shower/Tub: Teacher, early years/pre: Standard Bathroom Accessibility: No   Home Equipment: None          Prior Functioning/Environment Prior Level of Function : Independent/Modified Independent             Mobility Comments: Hydrographic surveyor without AD ADLs Comments: Indepndent; works        OT Problem List: Decreased strength;Decreased activity tolerance;Impaired balance (sitting and/or standing);Impaired vision/perception;Impaired sensation  OT Treatment/Interventions: Self-care/ADL training;Therapeutic exercise;Therapeutic activities;Visual/perceptual remediation/compensation;Patient/family education;Balance training;Neuromuscular education    OT Goals(Current goals can be found in the care plan section) Acute Rehab OT Goals Patient Stated Goal: return home OT Goal Formulation: With patient Time For Goal Achievement: 12/16/22 Potential to Achieve Goals: Good  OT Frequency: Min 2X/week    Co-evaluation PT/OT/SLP Co-Evaluation/Treatment: Yes Reason for Co-Treatment: To address  functional/ADL transfers   OT goals addressed during session: ADL's and self-care      AM-PAC OT "6 Clicks" Daily Activity     Outcome Measure Help from another person eating meals?: None Help from another person taking care of personal grooming?: A Little Help from another person toileting, which includes using toliet, bedpan, or urinal?: None Help from another person bathing (including washing, rinsing, drying)?: A Little Help from another person to put on and taking off regular upper body clothing?: None Help from another person to put on and taking off regular lower body clothing?: A Little 6 Click Score: 21   End of Session Equipment Utilized During Treatment: Gait belt Nurse Communication: Other (comment) (Present to observe mobility.)  Activity Tolerance: Patient tolerated treatment well Patient left: in bed;with call bell/phone within reach;with nursing/sitter in room  OT Visit Diagnosis: Unsteadiness on feet (R26.81);Other abnormalities of gait and mobility (R26.89);Muscle weakness (generalized) (M62.81);Hemiplegia and hemiparesis;Other symptoms and signs involving the nervous system (R29.898) Hemiplegia - Right/Left: Left Hemiplegia - dominant/non-dominant: Non-Dominant Hemiplegia - caused by:  (possible CVA)                Time: 0822-0849 OT Time Calculation (min): 27 min Charges:  OT General Charges $OT Visit: 1 Visit OT Evaluation $OT Eval Low Complexity: 1 Low  Ilynn Stauffer OT, MOT  Larey Seat 12/02/2022, 9:41 AM

## 2022-12-02 NOTE — Evaluation (Signed)
Clinical/Bedside Swallow Evaluation Patient Details  Name: Kayla Choi MRN: [id number] Date of Birth: [birth year]  Today's Date: 12/02/2022 Time: SLP Start Time (ACUTE ONLY): 51 SLP Stop Time (ACUTE ONLY): 1412 SLP Time Calculation (min) (ACUTE ONLY): 18 min  Past Medical History:  Past Medical History:  Diagnosis Date   Acid reflux    ADD (attention deficit disorder)    Anemia    iron deficinecy   Anxiety    with social phobia   Asthma    Balance problem    Chest pain    that occurs with anxiety   COPD (chronic obstructive pulmonary disease) ([place])    Depression    Diabetes mellitus    Glaucoma    Hyperlipidemia    Hypertension    Normal cardiac stress test 12/2015   low risk study   Panic attack    Prediabetes    Recurrent falls    Social phobia    Past Surgical History:  Past Surgical History:  Procedure Laterality Date   ABDOMINAL HYSTERECTOMY     fibroids, both ovaries left intact   ANKLE SURGERY     CHOLECYSTECTOMY     COLONOSCOPY N/A 10/18/2019   Procedure: COLONOSCOPY;  Surgeon: Daneil Dolin, MD;  [facility];  Service: Endoscopy;  Laterality: N/A;  1:00-office rescheduled to 11/10 @ 12:45pm   COSMETIC SURGERY     elbow   FRACTURE SURGERY     Recurrent elbow surgery   LIPOMA EXCISION  04/30/2012   Procedure: EXCISION LIPOMA;  Surgeon: Donato Heinz, MD;  Location: [facility];  Service: General;  Laterality: Right;  Excision soft tissue mass right thigh   MASS EXCISION Right 12/19/2020   Procedure: EXCISION CYST; 2 CM; AXILLA;  Surgeon: Virl Cagey, MD;  Location: [facility];  Service: General;  Laterality: Right;   ORIF ANKLE FRACTURE Right 12/15/2017   Procedure: OPEN REDUCTION INTERNAL FIXATION (ORIF) RIGHT ANKLE FRACTURE;  Surgeon: Renette Butters, MD;  Location: [place];  Service: Orthopedics;  Laterality: Right;   POLYPECTOMY  10/18/2019   Procedure: POLYPECTOMY;  Surgeon: Daneil Dolin, MD;  [facility];  Service: Endoscopy;;  colon   right elbow     reconstruction   HPI:  Kayla Choi is a 56 y.o. female with past medical history of hypertension, hyperlipidemia, diabetes, depression, anxiety who presented with left-sided numbness. States she was driving and noticed that left side of her face she did come to emergency room but tPA was not given due to atypical symptoms. MRI brain showed 1.9 x 1.4 x 2.9 cm acute infarct within the right corona radiata/basal ganglia. Pt failed the [place], BSE ordered. SLE ordered.    Assessment / Plan / Recommendation  Clinical Impression  Pt seen at bedside for clinical swallow evaluation at bedside (Pt failed [facility] in ED last night and has been NPO). Pt with left facial asymmetry with left labial/buccal weakness and min dysarthria. Pt reports occasional labial spillage of saliva. She was assessed with ice chips, thin water via tsp/cup/straw, puree, and regular textures. Pt without overt signs of reduced airway protection, but does report that she feels like the water may come out her nose. She drank ~12 ounces water via self presentation cup/straw without incident. Pt was encouraged to go slowly and take small sips for now. Will order regular textures and thin liquids and SLP to follow in acute stay (also for SLE) and recommend f/u via [facility] or outpatient SLP  services for dysphagia and dysarthria.    SLP Visit Diagnosis: Dysphagia, unspecified (R13.10)    Aspiration Risk  Mild aspiration risk    Diet Recommendation Regular;Thin liquid   Liquid Administration via: Cup;Straw Medication Administration: Whole meds with liquid Supervision: Patient able to self feed Compensations: Slow rate;Small sips/bites Postural Changes: Seated upright at 90 degrees;Remain upright for at least 30 minutes after po intake    Other  Recommendations Oral Care Recommendations: Oral care BID Other Recommendations: Clarify dietary restrictions    Recommendations for follow up  therapy are one component of a multi-disciplinary discharge planning process, led by the attending physician.  Recommendations may be updated based on patient status, additional functional criteria and insurance authorization.  Follow up Recommendations Follow physician's recommendations for discharge plan and follow up therapies      Assistance Recommended at Discharge    Functional Status Assessment Patient has had a recent decline in their functional status and demonstrates the ability to make significant improvements in function in a reasonable and predictable amount of time.  Frequency and Duration min 2x/week  1 week       Prognosis Prognosis for Safe Diet Advancement: Good      Swallow Study   General Date of Onset: 12/01/22 HPI: Kayla Choi is a 56 y.o. female with past medical history of hypertension, hyperlipidemia, diabetes, depression, anxiety who presented with left-sided numbness.  States she was driving and noticed that left side of her face she did come to emergency room but tPA was not given due to atypical symptoms.  MRI brain today did show acute ischemic stroke. Pt failed the [place], BSE ordered. SLE ordered. Type of Study: Bedside Swallow Evaluation Previous Swallow Assessment: N/A Diet Prior to this Study: NPO Temperature Spikes Noted: No Respiratory Status: Nasal cannula History of Recent Intubation: No Behavior/Cognition: Alert;Cooperative;Pleasant mood Oral Cavity Assessment: Within Functional Limits Oral Care Completed by SLP: Recent completion by staff Oral Cavity - Dentition: Adequate natural dentition Vision: Functional for self-feeding Self-Feeding Abilities: Able to feed self Patient Positioning: Upright in bed Baseline Vocal Quality: Normal;Low vocal intensity Volitional Cough: Weak Volitional Swallow: Able to elicit    Oral/Motor/Sensory Function Overall Oral Motor/Sensory Function: Moderate impairment Facial ROM: Reduced left;Suspected CN VII  (facial) dysfunction Facial Symmetry: Abnormal symmetry left;Suspected CN VII (facial) dysfunction Facial Strength: Reduced left;Suspected CN VII (facial) dysfunction Facial Sensation: Reduced left Lingual ROM: Within Functional Limits Lingual Symmetry: Within Functional Limits Lingual Strength: Within Functional Limits Lingual Sensation: Within Functional Limits Velum: Within Functional Limits Mandible: Within Functional Limits   Ice Chips Ice chips: Within functional limits Presentation: Spoon   Thin Liquid Thin Liquid: Within functional limits Presentation: Cup;Self Fed;Straw    Nectar Thick Nectar Thick Liquid: Not tested   Honey Thick Honey Thick Liquid: Not tested   Puree Puree: Within functional limits Presentation: Spoon;Self Fed   Solid     Solid: Within functional limits Presentation: Self Fed     Thank you,  Genene Churn, [place]  Bertel Venard 12/02/2022,2:31 PM

## 2022-12-02 NOTE — Evaluation (Signed)
Speech Language Pathology Evaluation Patient Details Name: Kayla Choi MRN: [id number] DOB: [birth year] Today's Date: 12/02/2022 Time: 4034-7425 SLP Time Calculation (min) (ACUTE ONLY): 22 min  Problem List:  Patient Active Problem List   Diagnosis Date Noted   Left sided numbness 12/01/2022   Class 2 obesity 09/02/2017   Recurrent falls 05/13/2016   Sebaceous cyst of right axilla 03/10/2016   Onychomycosis of toenail 02/28/2015   Knee pain 10/06/2014   Neck muscle spasm 07/02/2014   Back pain 10/23/2013   Headache 10/05/2012   Inadequate social support 06/25/2012   Microalbuminuria 06/25/2012   Diabetes mellitus, type II ([place]) 04/02/2012   Keloid scar of skin 09/03/2011   COPD (chronic obstructive pulmonary disease) ([place]) 05/21/2007   Hyperlipidemia 01/29/2007   Anxiety state 11/02/2006   MDD (major depressive disorder) 11/02/2006   Essential hypertension 11/02/2006   Past Medical History:  Past Medical History:  Diagnosis Date   Acid reflux    ADD (attention deficit disorder)    Anemia    iron deficinecy   Anxiety    with social phobia   Asthma    Balance problem    Chest pain    that occurs with anxiety   COPD (chronic obstructive pulmonary disease) ([place])    Depression    Diabetes mellitus    Glaucoma    Hyperlipidemia    Hypertension    Normal cardiac stress test 12/2015   low risk study   Panic attack    Prediabetes    Recurrent falls    Social phobia    Past Surgical History:  Past Surgical History:  Procedure Laterality Date   ABDOMINAL HYSTERECTOMY     fibroids, both ovaries left intact   ANKLE SURGERY     CHOLECYSTECTOMY     COLONOSCOPY N/A 10/18/2019   Procedure: COLONOSCOPY;  Surgeon: Daneil Dolin, MD;  [facility];  Service: Endoscopy;  Laterality: N/A;  1:00-office rescheduled to 11/10 @ 12:45pm   COSMETIC SURGERY     elbow   FRACTURE SURGERY     Recurrent elbow surgery   LIPOMA EXCISION  04/30/2012   Procedure: EXCISION  LIPOMA;  Surgeon: Donato Heinz, MD;  [facility];  Service: General;  Laterality: Right;  Excision soft tissue mass right thigh   MASS EXCISION Right 12/19/2020   Procedure: EXCISION CYST; 2 CM; AXILLA;  Surgeon: Virl Cagey, MD;  Location[facility];  Service: General;  Laterality: Right;   ORIF ANKLE FRACTURE Right 12/15/2017   Procedure: OPEN REDUCTION INTERNAL FIXATION (ORIF) RIGHT ANKLE FRACTURE;  Surgeon: Renette Butters, MD;  Location: [place];  Service: Orthopedics;  Laterality: Right;   POLYPECTOMY  10/18/2019   Procedure: POLYPECTOMY;  Surgeon: Daneil Dolin, MD;  [facility];  Service: Endoscopy;;  colon   right elbow     reconstruction   HPI:  Kayla Choi is a 56 y.o. female with past medical history of hypertension, hyperlipidemia, diabetes, depression, anxiety who presented with left-sided numbness.  States she was driving and noticed that left side of her face she did come to emergency room but tPA was not given due to atypical symptoms.  MRI brain showed 1.9 x 1.4 x 2.9 cm acute infarct within the right corona  radiata/basal ganglia. Pt failed the [place], BSE ordered. SLE ordered.   Assessment / Plan / Recommendation Clinical Impression  Pt presents with mild dysarthria characterized by imprecise articulation and reduced vocal intensity negatively impacting speech intelligibility  at the sentence and conversation level. Pt is aware that he speech "sounds different", but did not independently use strategies to increase speech intelligibility. She benefits from cues to reduce rate, increase volume, and over articulate. Pt also exhibiting mild attention deficits, however she indicates that she did not get much sleep last night. She will likely be discharging home today, recommend f/u [place] or outpatient SLP services to follow for dysarthria, dysphagia, and more in depth cognitive assessment if not resolved with sleep. Pt in agreement with plan of  care.    SLP Assessment  SLP Recommendation/Assessment: All further Speech Lanaguage Pathology  needs can be addressed in the next venue of care SLP Visit Diagnosis: Dysarthria and anarthria (R47.1);Cognitive communication deficit (R41.841)    Recommendations for follow up therapy are one component of a multi-disciplinary discharge planning process, led by the attending physician.  Recommendations may be updated based on patient status, additional functional criteria and insurance authorization.    Follow Up Recommendations  Home health SLP    Assistance Recommended at Discharge  Intermittent Supervision/Assistance  Functional Status Assessment Patient has had a recent decline in their functional status and demonstrates the ability to make significant improvements in function in a reasonable and predictable amount of time.  Frequency and Duration min 2x/week  1 week      SLP Evaluation Cognition  Overall Cognitive Status: Within Functional Limits for tasks assessed Arousal/Alertness: Awake/alert Orientation Level: Oriented X4 Year: 2023 Month: December Day of Week: Incorrect Attention: Sustained Sustained Attention: Impaired Sustained Attention Impairment: Verbal complex Memory: Appears intact Awareness: Appears intact Problem Solving: Appears intact Safety/Judgment: Appears intact       Comprehension  Auditory Comprehension Overall Auditory Comprehension: Appears within functional limits for tasks assessed Yes/No Questions: Within Functional Limits Commands: Within Functional Limits Conversation: Complex Interfering Components: Attention EffectiveTechniques: Repetition Visual Recognition/Discrimination Discrimination: Within Function Limits Reading Comprehension Reading Status: Within funtional limits    Expression Expression Primary Mode of Expression: Verbal Verbal Expression Overall Verbal Expression: Appears within functional limits for tasks  assessed Initiation: No impairment Automatic Speech: Name;Social Response;Month of year Level of Generative/Spontaneous Verbalization: Conversation Repetition: No impairment Naming: No impairment Pragmatics: No impairment Interfering Components: Speech intelligibility;Attention Non-Verbal Means of Communication: Not applicable Written Expression Dominant Hand: Right Written Expression: Not tested   Oral / Motor  Oral Motor/Sensory Function Overall Oral Motor/Sensory Function: Moderate impairment Facial ROM: Reduced left;Suspected CN VII (facial) dysfunction Facial Symmetry: Abnormal symmetry left;Suspected CN VII (facial) dysfunction Facial Strength: Reduced left;Suspected CN VII (facial) dysfunction Facial Sensation: Reduced left Lingual ROM: Within Functional Limits Lingual Symmetry: Within Functional Limits Lingual Strength: Within Functional Limits Lingual Sensation: Within Functional Limits Velum: Within Functional Limits Mandible: Within Functional Limits Motor Speech Overall Motor Speech: Impaired Respiration: Within functional limits Phonation: Normal Resonance: Within functional limits Articulation: Impaired Level of Impairment: Sentence Intelligibility: Intelligibility reduced Word: 75-100% accurate Phrase: 75-100% accurate Sentence: 75-100% accurate Conversation: 75-100% accurate Motor Planning: Witnin functional limits Motor Speech Errors: Aware;Unaware Effective Techniques: Over-articulate;Increased vocal intensity           Thank you,  Genene Churn, [place]  [place] 12/02/2022, 4:24 PM

## 2022-12-02 NOTE — Consult Note (Addendum)
I connected with  Kayla Choi on 12/02/22 by a video enabled telemedicine application and verified that I am speaking with the correct person using two identifiers.   I discussed the limitations of evaluation and management by telemedicine. The patient expressed understanding and agreed to proceed.  Location of patient: [facility] Location of physician: [facility]  Neurology Consultation Reason for Consult: stroke Referring Physician: Dr Heath Lark  CC: left sided numbness  History is obtained from: Patient, chart review  HPI: Kayla Choi is a 56 y.o. female with past medical history of hypertension, hyperlipidemia, diabetes, depression, anxiety who presented with left-sided numbness.  States she was driving and noticed that left side of her face she did come to emergency room but tPA was not given due to atypical symptoms.  MRI brain today did show acute ischemic stroke.  Therefore neurology was consulted for further recommendations  Last known normal: 12/01/2022 2000 Event happened in car No tPA as outside window now No thrombectomy as no large vessel occlusion mRS 0  ROS: All other systems reviewed and negative except as noted in the HPI.   Past Medical History:  Diagnosis Date   Acid reflux    ADD (attention deficit disorder)    Anemia    iron deficinecy   Anxiety    with social phobia   Asthma    Balance problem    Chest pain    that occurs with anxiety   COPD (chronic obstructive pulmonary disease) (HCC)    Depression    Diabetes mellitus    Glaucoma    Hyperlipidemia    Hypertension    Normal cardiac stress test 12/2015   low risk study   Panic attack    Prediabetes    Recurrent falls    Social phobia     Family History  Problem Relation Age of Onset   Hypertension Mother    Diabetes Father    Heart disease Father    Anesthesia problems Neg Hx    Malignant hyperthermia Neg Hx    Hypotension Neg Hx    Pseudochol deficiency Neg  Hx     Social History:  reports that she has never smoked. She has never used smokeless tobacco. She reports that she does not drink alcohol and does not use drugs.   Medications Prior to Admission  Medication Sig Dispense Refill Last Dose   acetaminophen (TYLENOL) 325 MG tablet Take 325 mg by mouth every 6 (six) hours as needed for moderate pain or headache.       albuterol (PROVENTIL) (2.5 MG/3ML) 0.083% nebulizer solution Take 3 mLs (2.5 mg total) by nebulization every 4 (four) hours as needed for wheezing or shortness of breath. 75 mL 2    albuterol (VENTOLIN HFA) 108 (90 Base) MCG/ACT inhaler Inhale 2 puffs into the lungs every 4 (four) hours as needed for wheezing or shortness of breath. 18 g 2    CRANBERRY PO Take 2 tablets by mouth every other day.      dapagliflozin propanediol (FARXIGA) 10 MG TABS tablet Take 10 mg by mouth daily before breakfast. 30 tablet 3    Dulaglutide (TRULICITY) 1.47 WG/9.5AO SOPN INJECT THE CONTENTS OF 1 PEN INTO THE SKIN ONCE WEEKLY. 2 mL 2    glipiZIDE (GLUCOTROL) 10 MG tablet Take 1 tablet (10 mg total) by mouth 2 (two) times daily with a meal. 180 tablet 0    lisinopril-hydrochlorothiazide (ZESTORETIC) 20-12.5 MG tablet TAKE ONE TABLET BY MOUTH ONCE DAILY.  90 tablet 3    loratadine (CLARITIN) 10 MG tablet Take 1 tablet (10 mg total) by mouth daily. 30 tablet 11    Phenyleph-Doxylamine-DM-APAP (ALKA SELTZER PLUS PO) Take 2 tablets by mouth daily as needed (congestion).      rosuvastatin (CRESTOR) 20 MG tablet Take 1 tablet (20 mg total) by mouth at bedtime. 90 tablet 3    Tiotropium Bromide Monohydrate (SPIRIVA RESPIMAT) 1.25 MCG/ACT AERS Inhale 2 puffs into the lungs daily. 4 g 0       Exam: Current vital signs: BP (!) 158/82 (BP Location: Right Arm)   Pulse 67   Temp 97.6 F (36.4 C) (Oral)   Resp 18   Ht '5\' 1"'$  (1.549 m)   Wt 80.3 kg   SpO2 98%   BMI 33.45 kg/m  Vital signs in last 24 hours: Temp:  [97.6 F (36.4 C)-98.3 F (36.8 C)] 97.6  F (36.4 C) (12/26 0544) Pulse Rate:  [21-119] 67 (12/26 0757) Resp:  [14-25] 18 (12/26 0757) BP: (158-203)/(82-146) 158/82 (12/26 0757) SpO2:  [96 %-99 %] 98 % (12/26 0544) FiO2 (%):  [21 %] 21 % (12/26 0238) Weight:  [80.3 kg] 80.3 kg (12/26 0238)   Physical Exam  Constitutional: Appears well-developed and well-nourished.  Psych: Affect appropriate to situation Eyes: No scleral injection Neuro:  AOx3,CN 2-12 grossly intact except left facial droop and decreased sensation on left side of face, no aphasia, antigravity strength in all extremities with drift in LUE, FTN intact BL, decreased sensation in LUE   NIHSS 5  1A: Level of consciousness --> 0 = Alert; keenly responsive 1B: Ask month and age --> 0 = Both questions right 1C: 'Blink eyes' & 'squeeze hands' --> 0 = Performs both tasks 2: Horizontal extraocular movements --> 0 = Normal 3: Visual fields --> 0 = No visual loss 4: Facial palsy --> 2 = Partial paralysis (lower face) 5A: Left arm motor drift --> 1 = Drift, but doesn't hit bed 5B: Right arm motor drift --> 0 = No drift for 10 seconds 6A: Left leg motor drift --> 0 = No drift for 5 seconds 6B: Right leg motor drift --> 0 = No drift for 5 seconds 7: Limb Ataxia --> 0 = No ataxia 8: Sensation --> 2 = Complete loss: cannot sense being touched at all 9: Language/aphasia --> 0 = Normal; no aphasia 10: Dysarthria --> 0 = Normal 11: Extinction/inattention --> 0 = No abnormality   I have reviewed labs in epic and the results pertinent to this consultation are: CBC:  Recent Labs  Lab 12/01/22 2128 12/02/22 0510  WBC 6.4 6.0  NEUTROABS 3.1  --   HGB 13.3 12.5  HCT 39.8 37.6  MCV 90.9 91.3  PLT 356 725    Basic Metabolic Panel:  Lab Results  Component Value Date   NA 137 12/02/2022   K 3.7 12/02/2022   CO2 27 12/02/2022   GLUCOSE 254 (H) 12/02/2022   BUN 9 12/02/2022   CREATININE 0.57 12/02/2022   CALCIUM 8.8 (L) 12/02/2022   GFRNONAA >60 12/02/2022   GFRAA  >60 02/18/2019   Lipid Panel:  Lab Results  Component Value Date   LDLCALC 148 (H) 12/02/2022   HgbA1c:  Lab Results  Component Value Date   HGBA1C 10.2 (H) 12/26/2020   Urine Drug Screen:     Component Value Date/Time   LABOPIA NONE DETECTED 12/01/2022 2302   COCAINSCRNUR NONE DETECTED 12/01/2022 2302   LABBENZ NONE DETECTED 12/01/2022 2302  AMPHETMU NONE DETECTED 12/01/2022 2302   THCU NONE DETECTED 12/01/2022 2302   LABBARB NONE DETECTED 12/01/2022 2302    Alcohol Level     Component Value Date/Time   ETH <10 12/01/2022 2128    I have reviewed the images obtained:  CT without contrast 12/01/2022: No acute intracranial abnormality.   CTA head and neck with and without contrast 12/01/2022: 1. Negative CTA of the head and neck. No large vessel occlusion or other emergent finding. 2. Negative CT perfusion with no evidence for acute ischemia or other perfusion deficit.  MRI brain without contrast 12/02/2022:  1.9 x 1.4 x 2.9 cm acute infarct within the right corona radiata/basal ganglia.  MRA head without contrast 12/02/2022:  1.No intracranial large vessel occlusion. 2. 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 3. Moderate focal stenosis within the left PCA P2 segment.  TTE pending    ASSESSMENT/PLAN: 56 year old female who presented with acute onset of left-sided weakness.  MRI brain showed right coronary radiate/basal ganglia acute ischemic stroke.  Acute ischemic stroke Hypertension Hyperlipidemia Diabetes -Etiology: Likely small vessel disease.  Recommendations: -Patient is allergic to aspirin, therefore recommend starting Plavix 75 mg daily for secondary stroke prevention -Recommended Crestor 20 mg daily for hyperlipidemia and secondary stroke prevention -Modification of stroke risk factors -Stroke education including BEFAST -Goal blood pressure: Permissive hypertension ( BP 220/120) for 24 hours followed by normotension (  BP<140/90). -Antihypertensives PRN if Blood pressure is greater than 220/120 or there is a concern for End organ damage/contraindications for permissive HTN. If blood pressure is greater than 220/120 give labetalol PO or IV or Vasotec IV with a goal of 15% reduction in BP during the first 24 hours  -If TTE does not show any thrombus, vegetation okay to discharge from neurology standpoint -Recommend follow-up with neurology in 3 months. -Discussed plan with patient and Dr. Manuella Ghazi  Thank you for allowing [place] to participate in the care of this patient. If you have any further questions, please contact  me or neurohospitalist.   Zeb Comfort [facility] neurohospitalist

## 2022-12-02 NOTE — Assessment & Plan Note (Signed)
-   Left-sided weakness and numbness with last known well 8 PM on Christmas Day - Weakness is now resolved, but patient remains numb - Left facial droop - CT code stroke shows no acute intracranial abnormality - CTA head and neck shows negative CTA of the head and neck - Neurology recommends telemetry, neurochecks, bedside swallow eval which patient initially passed, and then failed, DVT prophylaxis, aspirin 325 mg daily, permissive hypertension with treatment for blood pressure greater than 220/120 and MRI in the a.m. - Aspirin was not ordered due to allergy - PT/OT/ST eval and treat - Continue to monitor

## 2022-12-02 NOTE — TOC Initial Note (Signed)
Transition of Care [facility]) - Initial/Assessment Note    Patient Details  Name: Kayla Choi MRN: [id number] Date of Birth: 02/14/1966  Transition of Care [facility] CM/SW Contact:    Iona Beard, [place] Phone Number: 12/02/2022, 11:53 AM  Clinical Narrative:                 TOC updated that PT has recommended outpatient PT for pt at D/C. CSW spoke with pt about interest. Pt states she is interested in referral. CSW to make referral. TOC to follow.   Expected Discharge Plan: OP Rehab Barriers to Discharge: Continued Medical Work up   Patient Goals and CMS Choice Patient states their goals for this hospitalization and ongoing recovery are:: return home CMS Medicare.gov Compare Post Acute Care list provided to:: Patient Choice offered to / list presented to : Patient      Expected Discharge Plan and Services In-house Referral: Clinical Social Work Discharge Planning Services: CM Consult   Living arrangements for the past 2 months: Single Family Home                                      Prior Living Arrangements/Services Living arrangements for the past 2 months: Single Family Home Lives with:: Self Patient language and need for interpreter reviewed:: Yes Do you feel safe going back to the place where you live?: Yes      Need for Family Participation in Patient Care: Yes (Comment) Care giver support system in place?: Yes (comment)   Criminal Activity/Legal Involvement Pertinent to Current Situation/Hospitalization: No - Comment as needed  Activities of Daily Living Home Assistive Devices/Equipment: None ADL Screening (condition at time of admission) Patient's cognitive ability adequate to safely complete daily activities?: Yes Is the patient deaf or have difficulty hearing?: No Does the patient have difficulty seeing, even when wearing glasses/contacts?: No Does the patient have difficulty concentrating, remembering, or making decisions?: No Patient able to  express need for assistance with ADLs?: Yes Does the patient have difficulty dressing or bathing?: No Independently performs ADLs?: Yes (appropriate for developmental age) Does the patient have difficulty walking or climbing stairs?: No Weakness of Legs: Left Weakness of Arms/Hands: Left  Permission Sought/Granted                  Emotional Assessment Appearance:: Appears stated age Attitude/Demeanor/Rapport: Engaged Affect (typically observed): Accepting Orientation: : Oriented to Self, Oriented to Place, Oriented to  Time, Oriented to Situation Alcohol / Substance Use: Not Applicable Psych Involvement: No (comment)  Admission diagnosis:  Left sided numbness [R20.0] Patient Active Problem List   Diagnosis Date Noted   Left sided numbness 12/01/2022   Class 2 obesity 09/02/2017   Recurrent falls 05/13/2016   Sebaceous cyst of right axilla 03/10/2016   Onychomycosis of toenail 02/28/2015   Knee pain 10/06/2014   Neck muscle spasm 07/02/2014   Back pain 10/23/2013   Headache 10/05/2012   Inadequate social support 06/25/2012   Microalbuminuria 06/25/2012   Diabetes mellitus, type II ([place]) 04/02/2012   Keloid scar of skin 09/03/2011   COPD (chronic obstructive pulmonary disease) ([place]) 05/21/2007   Hyperlipidemia 01/29/2007   Anxiety state 11/02/2006   MDD (major depressive disorder) 11/02/2006   Essential hypertension 11/02/2006   PCP:  Alanson Puls [facility] [place]:   [place], [place] [address] [place] [address] Phone: [phone]  Fax: [phone]  [facility] [place], [place] - [address] [place] [address] Phone: [phone] Fax: [phone]     Social Determinants of Health ([place]) Social History: [place]: No Food Insecurity (12/02/2022)  Housing: Low Risk  (12/02/2022)  Transportation Needs: No  Transportation Needs (12/02/2022)  Utilities: At Risk (12/02/2022)  Alcohol Screen: Low Risk  (12/26/2020)  Depression (PHQ2-9): Low Risk  (12/26/2020)  Tobacco Use: Low Risk  (12/01/2022)   SDOH Interventions: Food Insecurity Interventions: Intervention Not Indicated Housing Interventions: Intervention Not Indicated Utilities Interventions: Inpatient TOC   Readmission Risk Interventions     No data to display

## 2022-12-02 NOTE — Progress Notes (Signed)
*  PRELIMINARY RESULTS* Echocardiogram 2D Echocardiogram has been performed.  Kayla Choi 12/02/2022, 12:10 PM

## 2022-12-02 NOTE — Plan of Care (Signed)
  Problem: Acute Rehab PT Goals(only PT should resolve) Goal: Pt Will Go Supine/Side To Sit Outcome: Progressing Flowsheets (Taken 12/02/2022 1409) Pt will go Supine/Side to Sit:  Independently  with modified independence Goal: Patient Will Transfer Sit To/From Stand Outcome: Progressing Flowsheets (Taken 12/02/2022 1409) Patient will transfer sit to/from stand:  Independently  with modified independence Goal: Pt Will Transfer Bed To Chair/Chair To Bed Outcome: Progressing Flowsheets (Taken 12/02/2022 1409) Pt will Transfer Bed to Chair/Chair to Bed:  Independently  with modified independence Goal: Pt Will Ambulate Outcome: Progressing Flowsheets (Taken 12/02/2022 1409) Pt will Ambulate:  > 125 feet  with supervision  with least restrictive assistive device   2:09 PM, 12/02/22 Lonell Grandchild, MPT Physical Therapist with [facility] [phone] office [phone] mobile phone

## 2022-12-02 NOTE — Discharge Summary (Signed)
Physician Discharge Summary  Kayla Choi YCX:[id number] DOB: [birth year] DOA: 12/01/2022  PCP: Alanson Puls [facility] date: 12/01/2022  Discharge date: 12/02/2022  Admitted From:Home  Disposition:  Home  Recommendations for Outpatient Follow-up:  Follow up with PCP in 1-2 weeks Follow-up with neurology with referral sent in the next 8-12 weeks and continue on Plavix daily as well as Crestor as prescribed Continue other home medications as prior  Home Health: Outpatient physical therapy  Equipment/Devices: None  Discharge Condition:Stable  CODE STATUS: Full  Diet recommendation: Heart Healthy/carb modified  Brief/Interim Summary: Kayla Choi is a 56 y.o. female with medical history significant of acid reflux, anxiety, COPD, depression, diabetes mellitus type 2, hyperlipidemia, hypertension, and more presents ED with a chief complaint of left-sided numbness.  It started at around 8 PM.  Patient was apparently driving home when it started.  She reports that she had associated blurry vision, slurred speech, dysphagia.  Patient was evaluated and CT did not demonstrate any acute abnormality and there was no large vessel occlusion on CTA.  Brain MRI did reveal an acute right basal ganglia territory infarct.  She was seen by neurology with recommendations to remain on Plavix 75 mg daily as well as Crestor as previously prescribed.  She was seen by physical therapy with recommendations for outpatient physical therapy and SLP evaluation revealed no difficulty with swallowing.  She is currently in stable condition for discharge and 2D echocardiogram results as noted below with no significant findings or thrombus noted.  Discharge Diagnoses:  Principal Problem:   Left sided numbness Active Problems:   Essential hypertension   Diabetes mellitus, type II ([place])   Headache  Principal discharge diagnosis: Acute right basal ganglia territory CVA.  Discharge  Instructions  Discharge Instructions     Ambulatory referral to Physical Therapy   Complete by: As directed    Diet - low sodium heart healthy   Complete by: As directed    Increase activity slowly   Complete by: As directed       Allergies as of 12/02/2022       Reactions   Aspirin Shortness Of Breath, Itching   Metformin And Related Anaphylaxis   Other Anaphylaxis   Green peas   Oxycodone Itching   Latex Itching, Rash   Neosporin [neomycin-bacitracin Zn-polymyx] Swelling, Rash, Other (See Comments)   skin peeling        Medication List     TAKE these medications    acetaminophen 325 MG tablet Commonly known as: TYLENOL Take 325 mg by mouth every 6 (six) hours as needed for moderate pain or headache.   albuterol 108 (90 Base) MCG/ACT inhaler Commonly known as: VENTOLIN HFA Inhale 2 puffs into the lungs every 4 (four) hours as needed for wheezing or shortness of breath.   albuterol (2.5 MG/3ML) 0.083% nebulizer solution Commonly known as: PROVENTIL Take 3 mLs (2.5 mg total) by nebulization every 4 (four) hours as needed for wheezing or shortness of breath.   ALKA SELTZER PLUS PO Take 2 tablets by mouth daily as needed (congestion).   clopidogrel 75 MG tablet Commonly known as: PLAVIX Take 1 tablet (75 mg total) by mouth daily.   CRANBERRY PO Take 2 tablets by mouth every other day.   Farxiga 10 MG Tabs tablet Generic drug: dapagliflozin propanediol Take 10 mg by mouth daily before breakfast.   glipiZIDE 10 MG tablet Commonly known as: GLUCOTROL Take 1 tablet (10 mg total) by mouth 2 (two) times daily with  a meal.   lisinopril-hydrochlorothiazide 20-12.5 MG tablet Commonly known as: ZESTORETIC TAKE ONE TABLET BY MOUTH ONCE DAILY.   loratadine 10 MG tablet Commonly known as: CLARITIN Take 1 tablet (10 mg total) by mouth daily.   rosuvastatin 20 MG tablet Commonly known as: Crestor Take 1 tablet (20 mg total) by mouth at bedtime.   Spiriva  Respimat 1.25 MCG/ACT Aers Generic drug: Tiotropium Bromide Monohydrate Inhale 2 puffs into the lungs daily.   Trulicity 4.09 WJ/1.9JY Sopn Generic drug: Dulaglutide INJECT THE CONTENTS OF 1 PEN INTO THE SKIN ONCE WEEKLY.        Follow-up Information     [facility]. Schedule an appointment as soon as possible for a visit in 1 week(s).   Contact information: [place] [address] [phone]         [facility]. Go in 3 month(s).   Specialty: Neurology Contact information: [address] [place] [phone]               Allergies  Allergen Reactions   Aspirin Shortness Of Breath and Itching   Metformin And Related Anaphylaxis   Other Anaphylaxis    Green peas   Oxycodone Itching   Latex Itching and Rash   Neosporin [Neomycin-Bacitracin Zn-Polymyx] Swelling, Rash and Other (See Comments)    skin peeling    Consultations: Neurology   Procedures/Studies: ECHOCARDIOGRAM COMPLETE  Result Date: 12/02/2022    ECHOCARDIOGRAM REPORT   Patient Name:   Kayla Choi Date of Exam: 12/02/2022 Medical Rec #:  [id number]       Height:       61.0 in Accession #:    [id number]      Weight:       177.0 lb Date of Birth:  05/08/1966        BSA:          1.793 m Patient Age:    56 years        BP:           158/82 mmHg Patient Gender: F               HR:           66 bpm. Exam Location:  [facility] Procedure: 2D Echo, Cardiac Doppler and Color Doppler Indications:    Stroke  History:        Patient has prior history of Echocardiogram examinations, most                 recent 12/19/2015. COPD; Risk Factors:Hypertension, Diabetes and                 Dyslipidemia.  Sonographer:    Wenda Low Referring Phys: [id number] ASIA B [place]  1. Left ventricular ejection fraction, by estimation, is 60 to 65%. The left ventricle has normal function. The left ventricle has no regional wall  motion abnormalities. There is mild left ventricular hypertrophy. Left ventricular diastolic parameters are consistent with Grade I diastolic dysfunction (impaired relaxation).  2. Right ventricular systolic function is normal. The right ventricular size is normal. There is normal pulmonary artery systolic pressure.  3. The mitral valve is normal in structure. No evidence of mitral valve regurgitation. No evidence of mitral stenosis.  4. The aortic valve is tricuspid. Aortic valve regurgitation is not visualized. No aortic stenosis is present.  5. IVC appears small, suggesting low RA pressure and hypovolemia. FINDINGS  Left Ventricle:  Left ventricular ejection fraction, by estimation, is 60 to 65%. The left ventricle has normal function. The left ventricle has no regional wall motion abnormalities. The left ventricular internal cavity size was normal in size. There is  mild left ventricular hypertrophy. Left ventricular diastolic parameters are consistent with Grade I diastolic dysfunction (impaired relaxation). Normal left ventricular filling pressure. Right Ventricle: The right ventricular size is normal. Right vetricular wall thickness was not well visualized. Right ventricular systolic function is normal. There is normal pulmonary artery systolic pressure. The tricuspid regurgitant velocity is 2.43 m/s, and with an assumed right atrial pressure of 3 mmHg, the estimated right ventricular systolic pressure is 15.1 mmHg. Left Atrium: Left atrial size was normal in size. Right Atrium: Right atrial size was normal in size. Pericardium: There is no evidence of pericardial effusion. Mitral Valve: The mitral valve is normal in structure. No evidence of mitral valve regurgitation. No evidence of mitral valve stenosis. MV peak gradient, 3.1 mmHg. The mean mitral valve gradient is 1.0 mmHg. Tricuspid Valve: The tricuspid valve is normal in structure. Tricuspid valve regurgitation is not demonstrated. No evidence of  tricuspid stenosis. Aortic Valve: The aortic valve is tricuspid. Aortic valve regurgitation is not visualized. No aortic stenosis is present. Aortic valve mean gradient measures 3.0 mmHg. Aortic valve peak gradient measures 5.1 mmHg. Aortic valve area, by VTI measures 2.54 cm. Pulmonic Valve: The pulmonic valve was not well visualized. Pulmonic valve regurgitation is not visualized. No evidence of pulmonic stenosis. Aorta: The aortic root is normal in size and structure. Venous: IVC appears small, suggesting low RA pressure and hypovolemia. IAS/Shunts: No atrial level shunt detected by color flow Doppler.  LEFT VENTRICLE PLAX 2D LVIDd:         4.00 cm   Diastology LVIDs:         2.60 cm   LV e' medial:    5.77 cm/s LV PW:         1.10 cm   LV E/e' medial:  9.6 LV IVS:        1.10 cm   LV e' lateral:   8.27 cm/s LVOT diam:     1.90 cm   LV E/e' lateral: 6.7 LV SV:         54 LV SV Index:   30 LVOT Area:     2.84 cm  RIGHT VENTRICLE RV Basal diam:  2.40 cm RV Mid diam:    1.90 cm RV S prime:     10.90 cm/s TAPSE (M-mode): 2.4 cm LEFT ATRIUM             Index        RIGHT ATRIUM           Index LA diam:        3.60 cm 2.01 cm/m   RA Area:     11.40 cm LA Vol (A2C):   36.9 ml 20.58 ml/m  RA Volume:   22.90 ml  12.77 ml/m LA Vol (A4C):   35.6 ml 19.85 ml/m LA Biplane Vol: 36.5 ml 20.35 ml/m  AORTIC VALVE AV Area (Vmax):    2.41 cm AV Area (Vmean):   2.25 cm AV Area (VTI):     2.54 cm AV Vmax:           113.00 cm/s AV Vmean:          76.000 cm/s AV VTI:            0.214 m AV Peak Grad:  5.1 mmHg AV Mean Grad:      3.0 mmHg LVOT Vmax:         96.10 cm/s LVOT Vmean:        60.300 cm/s LVOT VTI:          0.192 m LVOT/AV VTI ratio: 0.90  AORTA Ao Root diam: 2.70 cm MITRAL VALVE               TRICUSPID VALVE MV Area (PHT): 3.01 cm    TR Peak grad:   23.6 mmHg MV Area VTI:   2.42 cm    TR Vmax:        243.00 cm/s MV Peak grad:  3.1 mmHg MV Mean grad:  1.0 mmHg    SHUNTS MV Vmax:       0.88 m/s    Systemic VTI:   0.19 m MV Vmean:      46.8 cm/s   Systemic Diam: 1.90 cm MV Decel Time: 252 msec MV E velocity: 55.50 cm/s MV A velocity: 71.90 cm/s MV E/A ratio:  0.77 Carlyle Dolly MD Electronically signed by Carlyle Dolly MD Signature Date/Time: 12/02/2022/1:43:46 PM    Final    MR BRAIN WO CONTRAST  Result Date: 12/02/2022 CLINICAL DATA:  Provided history: Neuro deficit, acute, stroke suspected. EXAM: MRI HEAD WITHOUT CONTRAST MRA HEAD WITHOUT CONTRAST TECHNIQUE: Multiplanar, multi-echo pulse sequences of the brain and surrounding structures were acquired without intravenous contrast. Angiographic images of the Circle of Willis were acquired using MRA technique without intravenous contrast. COMPARISON:  Non-contrast head CT and CT angiogram head/neck 12/01/2022. FINDINGS: MRI HEAD FINDINGS Brain: No age advanced or lobar predominant parenchymal atrophy. 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 No evidence of an intracranial mass. No chronic intracranial blood products. No extra-axial fluid collection. No midline shift. Vascular: Maintained flow voids within the proximal large arterial vessels. Skull and upper cervical spine: Abnormal T1 hypointense marrow signal within the calvarium and visualized upper cervical spine. Sinuses/Orbits: Bilateral proptosis. No significant paranasal sinus disease. MRA HEAD FINDINGS Anterior circulation: The intracranial internal carotid arteries are patent. The M1 middle cerebral arteries are patent. Moderate stenosis within the mid M1 right middle cerebral artery (series 1035, image 206). No M2 proximal branch occlusion or high-grade proximal stenosis. The anterior cerebral arteries are patent. No intracranial aneurysm is identified. Posterior circulation: The intracranial vertebral arteries are patent. The basilar artery is patent. The posterior cerebral arteries are patent. Moderate stenosis within the left PCA P2 segment. Posterior communicating  arteries are present, bilaterally. Anatomic variants: None significant. IMPRESSION: MRI brain: 1. 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 2. Abnormal T1 hypointense marrow signal within the calvarium and visualized upper cervical spine. While this findings can reflect a marrow infiltrative process, the most common causes include chronic anemia, smoking and obesity. 3. Bilateral proptosis. MRA head: 1. No intracranial large vessel occlusion. 2. 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 3. Moderate focal stenosis within the left PCA P2 segment. Electronically Signed   By: Kellie Simmering D.O.   On: 12/02/2022 10:13   MR ANGIO HEAD WO CONTRAST  Result Date: 12/02/2022 CLINICAL DATA:  Provided history: Neuro deficit, acute, stroke suspected. EXAM: MRI HEAD WITHOUT CONTRAST MRA HEAD WITHOUT CONTRAST TECHNIQUE: Multiplanar, multi-echo pulse sequences of the brain and surrounding structures were acquired without intravenous contrast. Angiographic images of the Circle of Willis were acquired using MRA technique without  intravenous contrast. COMPARISON:  Non-contrast head CT and CT angiogram head/neck 12/01/2022. FINDINGS: MRI HEAD FINDINGS Brain: No age advanced or lobar predominant parenchymal atrophy. 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 No evidence of an intracranial mass. No chronic intracranial blood products. No extra-axial fluid collection. No midline shift. Vascular: Maintained flow voids within the proximal large arterial vessels. Skull and upper cervical spine: Abnormal T1 hypointense marrow signal within the calvarium and visualized upper cervical spine. Sinuses/Orbits: Bilateral proptosis. No significant paranasal sinus disease. MRA HEAD FINDINGS Anterior circulation: The intracranial internal carotid arteries are patent. The M1 middle cerebral arteries are patent. Moderate stenosis within the mid  M1 right middle cerebral artery (series 1035, image 206). No M2 proximal branch occlusion or high-grade proximal stenosis. The anterior cerebral arteries are patent. No intracranial aneurysm is identified. Posterior circulation: The intracranial vertebral arteries are patent. The basilar artery is patent. The posterior cerebral arteries are patent. Moderate stenosis within the left PCA P2 segment. Posterior communicating arteries are present, bilaterally. Anatomic variants: None significant. IMPRESSION: MRI brain: 1. 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 2. Abnormal T1 hypointense marrow signal within the calvarium and visualized upper cervical spine. While this findings can reflect a marrow infiltrative process, the most common causes include chronic anemia, smoking and obesity. 3. Bilateral proptosis. MRA head: 1. No intracranial large vessel occlusion. 2. 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 3. Moderate focal stenosis within the left PCA P2 segment. Electronically Signed   By: Kellie Simmering D.O.   On: 12/02/2022 10:13   CT ANGIO HEAD NECK W WO CM W PERF (CODE STROKE)  Result Date: 12/01/2022 CLINICAL DATA:  Initial evaluation for neuro deficit, stroke suspected. EXAM: CT ANGIOGRAPHY HEAD AND NECK CT PERFUSION BRAIN TECHNIQUE: Multidetector CT imaging of the head and neck was performed using the standard protocol during bolus administration of intravenous contrast. Multiplanar CT image reconstructions and MIPs were obtained to evaluate the vascular anatomy. Carotid stenosis measurements (when applicable) are obtained utilizing NASCET criteria, using the distal internal carotid diameter as the denominator. Multiphase CT imaging of the brain was performed following IV bolus contrast injection. Subsequent parametric perfusion maps were calculated using RAPID software. RADIATION DOSE REDUCTION: This exam was performed according to the  departmental dose-optimization program which includes automated exposure control, adjustment of the mA and/or kV according to patient size and/or use of iterative reconstruction technique. CONTRAST:  154m OMNIPAQUE IOHEXOL 350 MG/ML SOLN COMPARISON:  Comparison made with prior CT from earlier the same day. FINDINGS: CTA NECK FINDINGS Aortic arch: Visualized aortic arch normal in caliber with standard 3 vessel morphology. Minimal plaque about the arch itself. No stenosis about the origin the great vessels. Right carotid system: Right common and internal carotid arteries patent without dissection. Mild atheromatous irregularity about the right carotid bulb without hemodynamically significant greater than 50% stenosis. Left carotid system: Left common and internal carotid arteries are patent without dissection. Mild atheromatous irregularity about the left carotid bulb without hemodynamically significant greater than 50% stenosis. Vertebral arteries: Both vertebral arteries arise from the subclavian arteries. No proximal subclavian artery stenosis. Right vertebral artery dominant. Vertebral arteries patent without stenosis or dissection. Skeleton: No discrete or worrisome osseous lesions. Mild spondylosis noted at C6-7. Other neck: No other acute soft tissue abnormality within the neck. Upper chest: Visualized upper chest demonstrates no acute finding. Review of the MIP images confirms the above  findings CTA HEAD FINDINGS Anterior circulation: Both internal carotid arteries widely patent to the termini without stenosis. A1 segments widely patent. Normal anterior communicating artery complex. Both anterior cerebral arteries widely patent to their distal aspects without stenosis. No M1 stenosis or occlusion. Normal MCA bifurcations. Distal MCA branches well perfused and symmetric. Posterior circulation: Both vertebral arteries patent without stenosis. Left PICA patent. Right PICA not well seen. Basilar patent without  stenosis. Superior cerebellar arteries and posterior cerebral arteries patent bilaterally. Venous sinuses: Patent allowing for timing the contrast bolus. Anatomic variants: None significant.  No aneurysm. Review of the MIP images confirms the above findings CT Brain Perfusion Findings: ASPECTS: Ten CBF (<30%) Volume: 69m Perfusion (Tmax>6.0s) volume: 027mMismatch Volume: 85m785mnfarction Location:Negative CT perfusion with no evidence for acute ischemia or other perfusion deficit. IMPRESSION: 1. Negative CTA of the head and neck. No large vessel occlusion or other emergent finding. 2. Negative CT perfusion with no evidence for acute ischemia or other perfusion deficit. Electronically Signed   By: BenJeannine BogaD.   On: 12/01/2022 23:43   CT HEAD CODE STROKE WO CONTRAST  Result Date: 12/01/2022 CLINICAL DATA:  Code stroke. Initial evaluation for neuro deficit, stroke suspected. EXAM: CT HEAD WITHOUT CONTRAST TECHNIQUE: Contiguous axial images were obtained from the base of the skull through the vertex without intravenous contrast. RADIATION DOSE REDUCTION: This exam was performed according to the departmental dose-optimization program which includes automated exposure control, adjustment of the mA and/or kV according to patient size and/or use of iterative reconstruction technique. COMPARISON:  Prior CT from 07/27/2009. FINDINGS: Brain: Cerebral volume within normal limits. No acute intracranial hemorrhage. Subcentimeter hypodensities at the inferior basal ganglia noted, similar to prior, and favored to reflect small dilated perivascular spaces. No acute cortically based infarct. No mass lesion or midline shift. No hydrocephalus or extra-axial fluid collection. Vascular: No visible hyperdense vessel. Skull: Scalp soft tissues and calvarium within normal limits. Sinuses/Orbits: Right gaze noted. Paranasal sinuses and mastoid air cells are clear. Other: None. ASPECTS ([facility] Program Early CT Score)  - Ganglionic level infarction (caudate, lentiform nuclei, internal capsule, insula, M1-M3 cortex): 7 - Supraganglionic infarction (M4-M6 cortex): 3 Total score (0-10 with 10 being normal): 10 IMPRESSION: 1. No acute intracranial abnormality. 2. ASPECTS is 10. 3. Right gaze preference. Critical Value/emergent results were called by telephone at the time of interpretation on 12/01/2022 at 9:20 pm to provider SCOSherwood Gamblerwho verbally acknowledged these results. Electronically Signed   By: BenJeannine BogaD.   On: 12/01/2022 21:22     Discharge Exam: Vitals:   12/02/22 0757 12/02/22 1434  BP: (!) 158/82 (!) 154/78  Pulse: 67 72  Resp: 18 16  Temp:  97.9 F (36.6 C)  SpO2:  95%   Vitals:   12/02/22 0238 12/02/22 0544 12/02/22 0757 12/02/22 1434  BP: (!) 188/94 (!) 179/102 (!) 158/82 (!) 154/78  Pulse: 82 92 67 72  Resp: '14 19 18 16  '$ Temp: 97.9 F (36.6 C) 97.6 F (36.4 C)  97.9 F (36.6 C)  TempSrc: Oral Oral  Oral  SpO2: 97% 98%  95%  Weight: 80.3 kg     Height: '5\' 1"'$  (1.549 m)       General: Pt is alert, awake, not in acute distress Cardiovascular: RRR, S1/S2 +, no rubs, no gallops Respiratory: CTA bilaterally, no wheezing, no rhonchi Abdominal: Soft, NT, ND, bowel sounds + Extremities: no edema, no cyanosis    The results of significant diagnostics from  this hospitalization (including imaging, microbiology, ancillary and laboratory) are listed below for reference.     Microbiology: No results found for this or any previous visit (from the past 240 hour(s)).   Labs: BNP (last 3 results) No results for input(s): "BNP" in the last 8760 hours. Basic Metabolic Panel: Recent Labs  Lab 12/01/22 2128 12/02/22 0510 12/02/22 0801  NA 137 137  --   K 3.6 3.7  --   CL 103 105  --   CO2 25 27  --   GLUCOSE 279* 275* 254*  BUN 12 9  --   CREATININE 0.72 0.57  --   CALCIUM 9.3 8.8*  --   MG  --  1.7  --    Liver Function Tests: Recent Labs  Lab 12/01/22 2128  12/02/22 0510  AST 20 19  ALT 22 21  ALKPHOS 51 41  BILITOT 1.0 1.2  PROT 8.0 6.6  ALBUMIN 4.1 3.4*   No results for input(s): "LIPASE", "AMYLASE" in the last 168 hours. No results for input(s): "AMMONIA" in the last 168 hours. CBC: Recent Labs  Lab 12/01/22 2128 12/02/22 0510  WBC 6.4 6.0  NEUTROABS 3.1  --   HGB 13.3 12.5  HCT 39.8 37.6  MCV 90.9 91.3  PLT 356 312   Cardiac Enzymes: No results for input(s): "CKTOTAL", "CKMB", "CKMBINDEX", "TROPONINI" in the last 168 hours. BNP: Invalid input(s): "POCBNP" CBG: Recent Labs  Lab 12/01/22 2103 12/01/22 2234 12/02/22 0735 12/02/22 1235  GLUCAP 277* 273* >600* 70   D-Dimer No results for input(s): "DDIMER" in the last 72 hours. Hgb A1c No results for input(s): "HGBA1C" in the last 72 hours. Lipid Profile Recent Labs    12/02/22 0510  CHOL 214*  HDL 58  LDLCALC 148*  TRIG 42  CHOLHDL 3.7   Thyroid function studies No results for input(s): "TSH", "T4TOTAL", "T3FREE", "THYROIDAB" in the last 72 hours.  Invalid input(s): "FREET3" Anemia work up No results for input(s): "VITAMINB12", "FOLATE", "FERRITIN", "TIBC", "IRON", "RETICCTPCT" in the last 72 hours. Urinalysis    Component Value Date/Time   COLORURINE YELLOW 12/01/2022 2302   APPEARANCEUR CLEAR 12/01/2022 2302   LABSPEC 1.025 12/01/2022 2302   PHURINE 6.0 12/01/2022 2302   GLUCOSEU >=500 (A) 12/01/2022 2302   HGBUR NEGATIVE 12/01/2022 2302   HGBUR negative 07/11/2009 0947   BILIRUBINUR NEGATIVE 12/01/2022 2302   KETONESUR NEGATIVE 12/01/2022 2302   PROTEINUR 30 (A) 12/01/2022 2302   UROBILINOGEN 1 05/31/2015 1505   NITRITE NEGATIVE 12/01/2022 2302   LEUKOCYTESUR NEGATIVE 12/01/2022 2302   Sepsis Labs Recent Labs  Lab 12/01/22 2128 12/02/22 0510  WBC 6.4 6.0   Microbiology No results found for this or any previous visit (from the past 240 hour(s)).   Time coordinating discharge: 35 minutes  SIGNED:   Rodena Goldmann, DO [facility] 12/02/2022, 3:44 PM  If 7PM-7AM, please contact night-coverage [email or web address]

## 2022-12-02 NOTE — Assessment & Plan Note (Signed)
-   Currently holding lisinopril and hydrochlorothiazide for permissive hypertension

## 2022-12-02 NOTE — Plan of Care (Signed)
  Problem: Acute Rehab OT Goals (only OT should resolve) Goal: Pt. Will Perform Grooming Flowsheets (Taken 12/02/2022 0944) Pt Will Perform Grooming:  Independently  standing Goal: Pt. Will Perform Upper Body Dressing Flowsheets (Taken 12/02/2022 0944) Pt Will Perform Upper Body Dressing: Independently Goal: Pt. Will Perform Lower Body Dressing Flowsheets (Taken 12/02/2022 0944) Pt Will Perform Lower Body Dressing: Independently Goal: Pt. Will Transfer To Toilet [place] (Taken 12/02/2022 [phone]) Pt Will Transfer to Toilet:  Independently  ambulating Goal: Pt/Caregiver Will Perform Home Exercise Program Flowsheets (Taken 12/02/2022 [phone]) Pt/caregiver will Perform Home Exercise Program:  Increased strength  Left upper extremity  Independently Goal: OT Additional ADL Goal #1 Flowsheets (Taken 12/02/2022 0944) Additional ADL Goal #1: Pt will demonstrate improved visual perception by identifying objects in L field of view during functional tasks with set up assist 75% of attempts.  Michaela Broski OT, MOT

## 2022-12-02 NOTE — H&P (Signed)
History and Physical    Patient: Kayla Choi GDJ:[id number] DOB: [birth year] DOA: 12/01/2022 DOS: the patient was seen and examined on 12/02/2022 PCP: [facility]  Patient coming from: Home  Chief Complaint:  Chief Complaint  Patient presents with   Code Stroke   HPI: Kayla Choi is a 56 y.o. female with medical history significant of acid reflux, anxiety, COPD, depression, diabetes mellitus type 2, hyperlipidemia, hypertension, and more presents ED with a chief complaint of left-sided numbness.  It started at around 8 PM.  Patient was apparently driving home when it started.  She reports that she had associated blurry vision, slurred speech, dysphagia.  Her daughter noticed her left facial droop.  Patient reports that she had weakness in her left upper extremity, and dropped her purse.  She had difficulty with ambulation due to dizziness.  She had numbness in her face as well.  Patient reports that most of the weakness is gone, but the numbness is still present.  Patient did swallow study in the ED and choked.  She reports she has an associated headache.  It is a frontal headache and she describes it as pressure.  Patient reports that she has had a headache like this before.  She also reports that she had chest pain earlier in the day it was lateral of the left midclavicular line.  She associated palpitations.  She reports the chest pain felt like burning.  On review of systems patient reports that she has had constipation.  Her last bowel movement was yesterday.  Patient has no other complaints at this time.  She does not smoke, does not drink, does not use illicit drugs.  She is not vaccinated for COVID.  Patient is full code.  Exam was really inconsistent.  She at first passed her swallow study with her nurse, but then when she took pills she started choking and said she could not get them down.  On my exam her left upper extremity started so weak, but if you keep challenging  and to become stronger during the exam.  Her left lower extremity did the same thing.  Her facial droop was the office that she would start out smiling, and then it would droop back down.  Very interesting. Review of Systems: As mentioned in the history of present illness. All other systems reviewed and are negative. Past Medical History:  Diagnosis Date   Acid reflux    ADD (attention deficit disorder)    Anemia    iron deficinecy   Anxiety    with social phobia   Asthma    Balance problem    Chest pain    that occurs with anxiety   COPD (chronic obstructive pulmonary disease) (HCC)    Depression    Diabetes mellitus    Glaucoma    Hyperlipidemia    Hypertension    Normal cardiac stress test 12/2015   low risk study   Panic attack    Prediabetes    Recurrent falls    Social phobia    Past Surgical History:  Procedure Laterality Date   ABDOMINAL HYSTERECTOMY     fibroids, both ovaries left intact   ANKLE SURGERY     CHOLECYSTECTOMY     COLONOSCOPY N/A 10/18/2019   Procedure: COLONOSCOPY;  Surgeon: Daneil Dolin, MD;  [facility];  Service: Endoscopy;  Laterality: N/A;  1:00-office rescheduled to 11/10 @ 12:45pm   COSMETIC SURGERY     elbow   FRACTURE  SURGERY     Recurrent elbow surgery   LIPOMA EXCISION  04/30/2012   Procedure: EXCISION LIPOMA;  Surgeon: Donato Heinz, MD;  Location: [facility];  Service: General;  Laterality: Right;  Excision soft tissue mass right thigh   MASS EXCISION Right 12/19/2020   Procedure: EXCISION CYST; 2 CM; AXILLA;  Surgeon: Virl Cagey, MD;  Location: [facility];  Service: General;  Laterality: Right;   ORIF ANKLE FRACTURE Right 12/15/2017   Procedure: OPEN REDUCTION INTERNAL FIXATION (ORIF) RIGHT ANKLE FRACTURE;  Surgeon: Renette Butters, MD;  Location: [facility];  Service: Orthopedics;  Laterality: Right;   POLYPECTOMY  10/18/2019   Procedure: POLYPECTOMY;  Surgeon: Daneil Dolin, MD;  [facility];  Service: Endoscopy;;  colon   right elbow     reconstruction   Social History:  reports that she has never smoked. She has never used smokeless tobacco. She reports that she does not drink alcohol and does not use drugs.  Allergies  Allergen Reactions   Aspirin Shortness Of Breath and Itching   Metformin And Related Anaphylaxis   Other Anaphylaxis    Green peas   Oxycodone Itching   Latex Itching and Rash   Neosporin [Neomycin-Bacitracin Zn-Polymyx] Swelling, Rash and Other (See Comments)    skin peeling    Family History  Problem Relation Age of Onset   Hypertension Mother    Diabetes Father    Heart disease Father    Anesthesia problems Neg Hx    Malignant hyperthermia Neg Hx    Hypotension Neg Hx    Pseudochol deficiency Neg Hx     Prior to Admission medications   Medication Sig Start Date End Date Taking? Authorizing Provider  acetaminophen (TYLENOL) 325 MG tablet Take 325 mg by mouth every 6 (six) hours as needed for moderate pain or headache.     [provider]  albuterol (PROVENTIL) (2.5 MG/3ML) 0.083% nebulizer solution Take 3 mLs (2.5 mg total) by nebulization every 4 (four) hours as needed for wheezing or shortness of breath. 04/23/20   Annie Main, FNP  albuterol (VENTOLIN HFA) 108 (90 Base) MCG/ACT inhaler Inhale 2 puffs into the lungs every 4 (four) hours as needed for wheezing or shortness of breath. 03/23/20   Alycia Rossetti, MD  CRANBERRY PO Take 2 tablets by mouth every other day.    [provider]  dapagliflozin propanediol (FARXIGA) 10 MG TABS tablet Take 10 mg by mouth daily before breakfast. 03/30/20   [place], Modena Nunnery, MD  Dulaglutide (TRULICITY) 1.51 VO/1.6[facility] INJECT THE CONTENTS OF 1 PEN INTO THE SKIN ONCE WEEKLY. 06/18/21   Susy Frizzle, MD  glipiZIDE (GLUCOTROL) 10 MG tablet Take 1 tablet (10 mg total) by mouth 2 (two) times daily with a meal. 06/18/21   Susy Frizzle, MD  lisinopril-hydrochlorothiazide  (ZESTORETIC) 20-12.5 MG tablet TAKE ONE TABLET BY MOUTH ONCE DAILY. 01/11/21   Alycia Rossetti, MD  loratadine (CLARITIN) 10 MG tablet Take 1 tablet (10 mg total) by mouth daily. 04/23/20   Annie Main, FNP  Phenyleph-Doxylamine-DM-APAP (ALKA SELTZER PLUS PO) Take 2 tablets by mouth daily as needed (congestion).    [provider]  rosuvastatin (CRESTOR) 20 MG tablet Take 1 tablet (20 mg total) by mouth at bedtime. 03/29/20   Alycia Rossetti, MD  Tiotropium Bromide Monohydrate (SPIRIVA RESPIMAT) 1.25 MCG/ACT AERS Inhale 2 puffs into the lungs daily. 03/23/20   Alycia Rossetti, MD  Physical Exam: Vitals:   12/01/22 2315 12/01/22 2330 12/02/22 0021 12/02/22 0238  BP:    (!) 188/94  Pulse: 91 (!) 103  82  Resp: '17 19  14  '$ Temp:    97.9 F (36.6 C)  TempSrc:    Oral  SpO2: 97% 96% 96% 97%  Weight:    80.3 kg  Height:    '5\' 1"'$  (1.549 m)   1.  General: Patient lying supine in bed,  no acute distress   2. Psychiatric: Alert and oriented x 3, mood and behavior normal for situation, pleasant and cooperative with exam   3. Neurologic: Speech slightly slurred, left facial droop, left upper extremity weakness, hypersensitivity in the left upper extremity when compared to the right upper extremity, left lower extremity weakness, right gaze preference   4. HEENMT:  Head is atraumatic, normocephalic, pupils reactive to light, neck is supple, trachea is midline, mucous membranes are moist   5. Respiratory : Lungs are clear to auscultation bilaterally without wheezing, rhonchi, rales, no cyanosis, no increase in work of breathing or accessory muscle use   6. Cardiovascular : Heart rate normal, rhythm is regular, no murmurs, rubs or gallops, no peripheral edema, peripheral pulses palpated   7. Gastrointestinal:  Abdomen is soft, nondistended, nontender to palpation bowel sounds active, no masses or organomegaly palpated   8. Skin:  Skin is warm, dry and intact without rashes,  acute lesions, or ulcers on limited exam   9.Musculoskeletal:  No acute deformities or trauma, no asymmetry in tone, no peripheral edema, peripheral pulses palpated, no tenderness to palpation in the extremities  Data Reviewed: In the ED Temp 98.1, heart rate 21-119, respiratory rate 16-20, blood pressure 168/87-203/129 - Hypertensive crisis resulting in these neuro changes is not ruled out No leukocytosis with white blood cell count of 6.4, hemoglobin 13.3, platelets 356 Glucose 229 Alcohol less than 10 CT head shows no acute intracranial abnormality CTA is negative for large vessel occlusion Neuro advises permissive hypertension and MRI in the a.m.  Assessment and Plan: * Left sided numbness - Left-sided weakness and numbness with last known well 8 PM on Christmas Day - Weakness is now resolved, but patient remains numb - Left facial droop - CT code stroke shows no acute intracranial abnormality - CTA head and neck shows negative CTA of the head and neck - Neurology recommends telemetry, neurochecks, bedside swallow eval which patient initially passed, and then failed, DVT prophylaxis, aspirin 325 mg daily, permissive hypertension with treatment for blood pressure greater than 220/120 and MRI in the a.m. - Aspirin was not ordered due to allergy - PT/OT/ST eval and treat - Continue to monitor  Headache - Frontal headache described as pressure - Tylenol did not help the pain - Adding Toradol (patient takes NSAID naproxen at home), Benadryl, Reglan for headache cocktail  Diabetes mellitus, type II ([place]) - Continue sliding scale coverage  Essential hypertension - Currently holding lisinopril and hydrochlorothiazide for permissive hypertension      Advance Care Planning:   Code Status: Full Code  Consults: Neurology  Family Communication: No family at bedside  Severity of Illness: The appropriate patient status for this patient is OBSERVATION. Observation status is  judged to be reasonable and necessary in order to provide the required intensity of service to ensure the patient's safety. The patient's presenting symptoms, physical exam findings, and initial radiographic and laboratory data in the context of their medical condition is felt to place them at decreased risk for  further clinical deterioration. Furthermore, it is anticipated that the patient will be medically stable for discharge from the hospital within 2 midnights of admission.   Author: Rolla Plate, DO 12/02/2022 5:19 AM  For on call review [email or web address].

## 2022-12-03 LAB — HEMOGLOBIN A1C
Hgb A1c MFr Bld: 11.2 % — ABNORMAL HIGH (ref 4.8–5.6)
Mean Plasma Glucose: 275 mg/dL

## 2022-12-10 ENCOUNTER — Ambulatory Visit (HOSPITAL_COMMUNITY): Payer: Medicaid Other | Attending: Internal Medicine | Admitting: Physical Therapy

## 2022-12-10 DIAGNOSIS — R200 Anesthesia of skin: Secondary | ICD-10-CM | POA: Insufficient documentation

## 2022-12-10 DIAGNOSIS — M6281 Muscle weakness (generalized): Secondary | ICD-10-CM | POA: Diagnosis not present

## 2022-12-10 DIAGNOSIS — R262 Difficulty in walking, not elsewhere classified: Secondary | ICD-10-CM | POA: Diagnosis not present

## 2022-12-10 NOTE — Therapy (Addendum)
OUTPATIENT PHYSICAL THERAPY NEURO EVALUATION   Patient Name: Kayla Choi MRN: [id number] DOB:[birth year], 57 y.o., female Today's Date: 12/10/2022   PCP: [facility] REFERRING PROVIDER: Heath Lark D, DO  END OF SESSION:  PT End of Session - 12/10/22 1039     Number of Visits 12    Date for PT Re-Evaluation 01/21/23    Authorization Type healthy blue    Authorization Time Period 27 for PT/OT and SP combined.  therapist requested OT and speech    Progress Note Due on Visit 10    Activity Tolerance Patient tolerated treatment well;Patient limited by fatigue    Behavior During Therapy [facility] for tasks assessed/performed             Past Medical History:  Diagnosis Date   Acid reflux    ADD (attention deficit disorder)    Anemia    iron deficinecy   Anxiety    with social phobia   Asthma    Balance problem    Chest pain    that occurs with anxiety   COPD (chronic obstructive pulmonary disease) ([place])    Depression    Diabetes mellitus    Glaucoma    Hyperlipidemia    Hypertension    Normal cardiac stress test 12/2015   low risk study   Panic attack    Prediabetes    Recurrent falls    Social phobia    Past Surgical History:  Procedure Laterality Date   ABDOMINAL HYSTERECTOMY     fibroids, both ovaries left intact   ANKLE SURGERY     CHOLECYSTECTOMY     COLONOSCOPY N/A 10/18/2019   Procedure: COLONOSCOPY;  Surgeon: Daneil Dolin, MD;  [facility];  Service: Endoscopy;  Laterality: N/A;  1:00-office rescheduled to 11/10 @ 12:45pm   COSMETIC SURGERY     elbow   FRACTURE SURGERY     Recurrent elbow surgery   LIPOMA EXCISION  04/30/2012   Procedure: EXCISION LIPOMA;  Surgeon: Donato Heinz, MD;  [facility];  Service: General;  Laterality: Right;  Excision soft tissue mass right thigh   MASS EXCISION Right 12/19/2020   Procedure: EXCISION CYST; 2 CM; AXILLA;  Surgeon: Virl Cagey, MD;  Location[facility];  Service: General;   Laterality: Right;   ORIF ANKLE FRACTURE Right 12/15/2017   Procedure: OPEN REDUCTION INTERNAL FIXATION (ORIF) RIGHT ANKLE FRACTURE;  Surgeon: Renette Butters, MD;  Location: [place];  Service: Orthopedics;  Laterality: Right;   POLYPECTOMY  10/18/2019   Procedure: POLYPECTOMY;  Surgeon: Daneil Dolin, MD;  [facility];  Service: Endoscopy;;  colon   right elbow     reconstruction   Patient Active Problem List   Diagnosis Date Noted   Left sided numbness 12/01/2022   Class 2 obesity 09/02/2017   Recurrent falls 05/13/2016   Sebaceous cyst of right axilla 03/10/2016   Onychomycosis of toenail 02/28/2015   Knee pain 10/06/2014   Neck muscle spasm 07/02/2014   Back pain 10/23/2013   Headache 10/05/2012   Inadequate social support 06/25/2012   Microalbuminuria 06/25/2012   Diabetes mellitus, type II ([place]) 04/02/2012   Keloid scar of skin 09/03/2011   COPD (chronic obstructive pulmonary disease) ([place]) 05/21/2007   Hyperlipidemia 01/29/2007   Anxiety state 11/02/2006   MDD (major depressive disorder) 11/02/2006   Essential hypertension 11/02/2006    ONSET DATE: 12/02/2022  REFERRING DIAG: R20.0 (ICD-10-CM) - Left sided numbness  THERAPY DIAG:  Muscle weakness (generalized) - Plan: PT plan of care cert/re-cert  Difficulty in walking, not elsewhere classified - Plan: PT plan of care cert/re-cert  Rationale for Evaluation and Treatment: Rehabilitation  SUBJECTIVE:                                                                                                                                                                                             SUBJECTIVE STATEMENT: PT states that she is having difficulty standing , walking and dressing.  She can stand for about 20 minutes, She only walks in her home for short periods of time due to not  venturing out.  She has lost her balance several time and fallen four times since discharge and it takes her  45 minutes to get up.  She lives by herself but her boyfriend is there 60% of the time  Pt accompanied by: family member  PERTINENT HISTORY: Kayla Choi is a 57 y.o. female with medical history significant of acid reflux, anxiety, COPD, depression, diabetes mellitus type 2, hyperlipidemia, hypertension, and more who presented to the  ED on 12/26  with a chief complaint of left-sided numbness.  Patient was apparently driving home when it started. She reports that she had associated blurry vision, slurred speech, dysphagia. Her daughter noticed her left facial droop. Patient reports that she had weakness in her left upper extremity, and dropped her purse. She had difficulty with ambulation due to dizziness. She had numbness in her face as well. Patient reports that most of the weakness is gone, but the numbness is still present.  PAIN:  Are you having pain? Yes: NPRS scale: 5/10  Pain location: toe nails are hurting as well as chronic back.  Therapist explained we would not be focusing on either of these but hopefully as she gets stronger she will have less pain.  Pain description: tingling  Aggravating factors: not sure Relieving factors: tylenol   PRECAUTIONS: Fall  WEIGHT BEARING RESTRICTIONS: No  FALLS: Has patient fallen in last 6 months? Yes; 4 times she is not sure if she is losing her balance or if her Lt leg is going out l    LIVING ENVIRONMENT: Lives with: lives alone Lives in: House/apartment Stairs: to enter the house but there is a ramp  Has following equipment at home: None  PLOF: Independent  PATIENT GOALS: Pt wants to get back to normal   OBJECTIVE:   DIAGNOSTIC FINDINGS: IMPRESSION: MRI brain:   1. 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 2. Abnormal T1 hypointense marrow signal within the calvarium and visualized upper  cervical spine. While this findings can reflect a marrow infiltrative process, the most common causes  include chronic anemia, smoking and obesity. 3. Bilateral proptosis.   MRA head:   1. No intracranial large vessel occlusion. 2. 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 3. Moderate focal stenosis within the left PCA P2 segment.     Electronically Signed   By: Kellie Simmering D.O.   On: 12/02/2022 10:13  COGNITION: Overall cognitive status: Within functional limits for tasks assessed   SENSATION: PT reports tingling in the left side of her body.    LOWER EXTREMITY MMT:    MMT Right Eval Left Eval  Hip flexion 3 2+  Hip extension 3- 2+  Hip abduction 5 3-  Hip adduction    Hip internal rotation    Hip external rotation    Knee flexion 3- 3  Knee extension 5 4  Ankle dorsiflexion 5 4+  Ankle plantarflexion    Ankle inversion    Ankle eversion    (Blank rows = not tested)  BED MOBILITY:  Mod I     FUNCTIONAL TESTS:  30 seconds chair stand test  4 Single leg stance:  Rt: 7" , LT: 4" 2 minute walk test : 121 ft. No assistive device decreased step length B, decreased ankle and knee motion on the LT>   TODAY'S TREATMENT:                                                                                                                              DATE:  12/10/22: Evaluation : Sitting: LAQ x 10 Sit to stand x 10 Supine : Bridge x 10   Stand: Minisquat x 5   PATIENT EDUCATION: Education details: HEP Person educated: Patient Education method: Consulting civil engineer, Verbal cues, and Handouts Education comprehension: verbalized understanding and returned demonstration  HOME EXERCISE PROGRAM: Access Code: [email or web address] URL: [email or web address] Date: 12/10/2022 Prepared by: Rayetta Humphrey  Exercises - Sit to Stand Without Arm Support  - 2 x daily - 7 x weekly - 1 sets - 10 reps - 2 hold - Seated Long Arc Quad  - 2 x daily - 7 x weekly - 1 sets - 10 reps - 5" hold - Mini Squat with Counter Support  - 2 x daily - 7 x weekly - 1  sets - 10 reps - 5" hold - Supine Bridge  - 2 x daily - 7 x weekly - 1 sets - 10 reps - 5" hold   GOALS: Goals reviewed with patient? No  SHORT TERM GOALS: Target date: 12/31/22  PT to be I in HEP to improve strength by 1/2 grade to decrease falls  Baseline: Goal status: INITIAL  2.  PT to be able to single leg stance for 10 seconds for decreased fall risk in the home  Baseline:  Goal status: INITIAL  3.  PT to be able to stand for an hour to cook  a full meal  Baseline:  Goal status: INITIAL    LONG TERM GOALS: Target date: 01/21/23  PT to be I in HEP to improve strength by 1 grade to decrease falls and be able to get up off the floor with increased ease.  Baseline:  Goal status: INITIAL  2.  PT to be able to single leg stance for 20 seconds for decreased fall risk on uneven ground Baseline:  Goal status: INITIAL  3.  PT to be ambulating in the community  Baseline:  Goal status: INITIAL   ASSESSMENT:  CLINICAL IMPRESSION: Patient is a 57 y.o. female who was seen today for physical therapy evaluation and treatment for Rt CVA affecting Lt side. Evaluation demonstrated decreased strength, decreased balance, decreased activity tolerance, decreased mobility, and difficulty in balance.  Kayla Choi will benefit from skilled PT to address these things to decrease her risk of falling and improve her functional mobility  OBJECTIVE IMPAIRMENTS: Abnormal gait, decreased activity tolerance, decreased balance, decreased endurance, decreased mobility, difficulty walking, decreased strength, impaired UE functional use, and pain.   ACTIVITY LIMITATIONS: carrying, lifting, bending, standing, squatting, stairs, bathing, dressing, and locomotion level  PARTICIPATION LIMITATIONS: meal prep, cleaning, laundry, driving, shopping, community activity, occupation, and yard work  PERSONAL FACTORS: Fitness and 3+ comorbidities: COPD, dM, depression   are also affecting patient's functional  outcome.   REHAB POTENTIAL: Good  CLINICAL DECISION MAKING: Stable/uncomplicated  EVALUATION COMPLEXITY: Moderate  PLAN:  PT FREQUENCY: 2x/week  PT DURATION: 6 weeks  PLANNED INTERVENTIONS: Therapeutic exercises, Therapeutic activity, Neuromuscular re-education, Balance training, Gait training, Patient/Family education, and Self Care  PLAN FOR NEXT SESSION: Continue with strengthening and balance activity.   Rayetta Humphrey, PT CLT [phone]  12/10/2022, 10:36 AM

## 2022-12-16 ENCOUNTER — Ambulatory Visit (HOSPITAL_COMMUNITY): Payer: Medicaid Other

## 2022-12-16 ENCOUNTER — Encounter (HOSPITAL_COMMUNITY): Payer: Self-pay

## 2022-12-16 DIAGNOSIS — M6281 Muscle weakness (generalized): Secondary | ICD-10-CM

## 2022-12-16 DIAGNOSIS — R262 Difficulty in walking, not elsewhere classified: Secondary | ICD-10-CM | POA: Diagnosis not present

## 2022-12-16 DIAGNOSIS — R200 Anesthesia of skin: Secondary | ICD-10-CM | POA: Diagnosis not present

## 2022-12-16 NOTE — Therapy (Signed)
OUTPATIENT PHYSICAL THERAPY NEURO EVALUATION   Patient Name: Kayla Choi MRN: [id number] DOB:[birth year], 57 y.o., female Today's Date: 12/16/2022   PCP: [facility] PROVIDER: Heath Lark D, DO  END OF SESSION:  PT End of Session - 12/16/22 1218     Visit Number 2    Number of Visits 12    Authorization Type healthy blue    Authorization Time Period 27 for PT/OT and SP combined.  therapist requested OT and speech    Progress Note Due on Visit 10    PT Start Time 1123    PT Stop Time 1205    PT Time Calculation (min) 42 min    Equipment Utilized During Treatment --   SBA and QC during standing exercises   Activity Tolerance Patient tolerated treatment well;Patient limited by fatigue    Behavior During Therapy WFL for tasks assessed/performed              Past Medical History:  Diagnosis Date   Acid reflux    ADD (attention deficit disorder)    Anemia    iron deficinecy   Anxiety    with social phobia   Asthma    Balance problem    Chest pain    that occurs with anxiety   COPD (chronic obstructive pulmonary disease) ([place])    Depression    Diabetes mellitus    Glaucoma    Hyperlipidemia    Hypertension    Normal cardiac stress test 12/2015   low risk study   Panic attack    Prediabetes    Recurrent falls    Social phobia    Past Surgical History:  Procedure Laterality Date   ABDOMINAL HYSTERECTOMY     fibroids, both ovaries left intact   ANKLE SURGERY     CHOLECYSTECTOMY     COLONOSCOPY N/A 10/18/2019   Procedure: COLONOSCOPY;  Surgeon: Daneil Dolin, MD;  Location: [facility];  Service: Endoscopy;  Laterality: N/A;  1:00-office rescheduled to 11/10 @ 12:45pm   COSMETIC SURGERY     elbow   FRACTURE SURGERY     Recurrent elbow surgery   LIPOMA EXCISION  04/30/2012   Procedure: EXCISION LIPOMA;  Surgeon: Donato Heinz, MD;  Location: [facility];  Service: General;  Laterality: Right;  Excision soft tissue mass right thigh   MASS  EXCISION Right 12/19/2020   Procedure: EXCISION CYST; 2 CM; AXILLA;  Surgeon: Virl Cagey, MD;  Location: [facility];  Service: General;  Laterality: Right;   ORIF ANKLE FRACTURE Right 12/15/2017   Procedure: OPEN REDUCTION INTERNAL FIXATION (ORIF) RIGHT ANKLE FRACTURE;  Surgeon: Renette Butters, MD;  Location: [facility];  Service: Orthopedics;  Laterality: Right;   POLYPECTOMY  10/18/2019   Procedure: POLYPECTOMY;  Surgeon: Daneil Dolin, MD;  Location: [facility];  Service: Endoscopy;;  colon   right elbow     reconstruction   Patient Active Problem List   Diagnosis Date Noted   Left sided numbness 12/01/2022   Class 2 obesity 09/02/2017   Recurrent falls 05/13/2016   Sebaceous cyst of right axilla 03/10/2016   Onychomycosis of toenail 02/28/2015   Knee pain 10/06/2014   Neck muscle spasm 07/02/2014   Back pain 10/23/2013   Headache 10/05/2012   Inadequate social support 06/25/2012   Microalbuminuria 06/25/2012   Diabetes mellitus, type II ([place]) 04/02/2012   Keloid scar of skin 09/03/2011   COPD (chronic obstructive pulmonary disease) ([place]) 05/21/2007  Hyperlipidemia 01/29/2007   Anxiety state 11/02/2006   MDD (major depressive disorder) 11/02/2006   Essential hypertension 11/02/2006    ONSET DATE: 12/02/2022  REFERRING DIAG: R20.0 (ICD-10-CM) - Left sided numbness  THERAPY DIAG:  Muscle weakness (generalized)  Difficulty in walking, not elsewhere classified  Rationale for Evaluation and Treatment: Rehabilitation  SUBJECTIVE:                                                                                                                                                                                             SUBJECTIVE STATEMENT: 12/16/22:  Pt reports a fall in bathroom, stated she is very dizzy when initially gets up in bed or sitting for long periods of time.  Stated she was able to get up by herself but did take some time.  Reports she feels  fuzzy headed currently.  Unsure if she hurt Lt wrist following fall and does c/o Lt chest pain, has apt with MD on Friday.  Stated she is compliant with current medication and began HEP.  Eval subjective:  PT states that she is having difficulty standing , walking and dressing.  She can stand for about 20 minutes, She only walks in her home for short periods of time due to not  venturing out.  She has lost her balance several time and fallen four times since discharge and it takes her 45 minutes to get up.  She lives by herself but her boyfriend is there 60% of the time  Pt accompanied by: family member  PERTINENT HISTORY: Kayla Choi is a 57 y.o. female with medical history significant of acid reflux, anxiety, COPD, depression, diabetes mellitus type 2, hyperlipidemia, hypertension, and more who presented to the  ED on 12/26  with a chief complaint of left-sided numbness.  Patient was apparently driving home when it started. She reports that she had associated blurry vision, slurred speech, dysphagia. Her daughter noticed her left facial droop. Patient reports that she had weakness in her left upper extremity, and dropped her purse. She had difficulty with ambulation due to dizziness. She had numbness in her face as well. Patient reports that most of the weakness is gone, but the numbness is still present.  PAIN:  Are you having pain? Yes: NPRS scale: 5/10  Pain location: toe nails are hurting as well as chronic back.  Therapist explained we would not be focusing on either of these but hopefully as she gets stronger she will have less pain.  Pain description: tingling  Aggravating factors: not sure Relieving factors: tylenol   PRECAUTIONS: Fall  WEIGHT BEARING RESTRICTIONS: No  FALLS: Has patient  fallen in last 6 months? Yes; 4 times she is not sure if she is losing her balance or if her Lt leg is going out l    LIVING ENVIRONMENT: Lives with: lives alone Lives in: House/apartment Stairs:  to enter the house but there is a ramp  Has following equipment at home: None  PLOF: Independent  PATIENT GOALS: Pt wants to get back to normal   OBJECTIVE:   DIAGNOSTIC FINDINGS: IMPRESSION: MRI brain:   1. 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 2. Abnormal T1 hypointense marrow signal within the calvarium and visualized upper cervical spine. While this findings can reflect a marrow infiltrative process, the most common causes include chronic anemia, smoking and obesity. 3. Bilateral proptosis.   MRA head:   1. No intracranial large vessel occlusion. 2. 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 3. Moderate focal stenosis within the left PCA P2 segment.     Electronically Signed   By: Kellie Simmering D.O.   On: 12/02/2022 10:13  COGNITION: Overall cognitive status: Within functional limits for tasks assessed   SENSATION: PT reports tingling in the left side of her body.    LOWER EXTREMITY MMT:    MMT Right Eval Left Eval  Hip flexion 3 2+  Hip extension 3- 2+  Hip abduction 5 3-  Hip adduction    Hip internal rotation    Hip external rotation    Knee flexion 3- 3  Knee extension 5 4  Ankle dorsiflexion 5 4+  Ankle plantarflexion    Ankle inversion    Ankle eversion    (Blank rows = not tested)  BED MOBILITY:  Mod I     FUNCTIONAL TESTS:  30 seconds chair stand test  4 Single leg stance:  Rt: 7" , LT: 4" 2 minute walk test : 121 ft. No assistive device decreased step length B, decreased ankle and knee motion on the LT>   TODAY'S TREATMENT:                                                                                                                              DATE:  12/16/22: Reviewed goals Educated importance of HEP compliance STS Standing:  Minisquat (cueing for mechanics) 10x front of chair Heel/ toe raises 10x each Sidestep inside //bars 2RT March 10x alternating with  HHA Seated: LAQ 10x Supine: Bridge 10x Sidelying: Abduction 10x    12/10/22: Evaluation : Sitting: LAQ x 10 Sit to stand x 10 Supine : Bridge x 10   Stand: Minisquat x 5   PATIENT EDUCATION: Education details: HEP Person educated: Patient Education method: Consulting civil engineer, Verbal cues, and Handouts Education comprehension: verbalized understanding and returned demonstration  HOME EXERCISE PROGRAM: Access Code: [email or web address] URL: [email or web address] Date: 12/10/2022 Prepared by: Rayetta Humphrey  Exercises - Sit to Stand Without Arm Support  - 2 x daily - 7 x weekly -  1 sets - 10 reps - 2 hold - Seated Long Arc Quad  - 2 x daily - 7 x weekly - 1 sets - 10 reps - 5" hold - Mini Squat with Counter Support  - 2 x daily - 7 x weekly - 1 sets - 10 reps - 5" hold - Supine Bridge  - 2 x daily - 7 x weekly - 1 sets - 10 reps - 5" hold  12/16/22 Prepared by: Ihor Austin Sidelying ab   GOALS: Goals reviewed with patient? No  SHORT TERM GOALS: Target date: 12/31/22  PT to be I in HEP to improve strength by 1/2 grade to decrease falls  Baseline: Goal status: IN PROGRESS  2.  PT to be able to single leg stance for 10 seconds for decreased fall risk in the home  Baseline:  Goal status: IN PROGRESS  3.  PT to be able to stand for an hour to cook a full meal  Baseline:  Goal status: IN PROGRESS    LONG TERM GOALS: Target date: 01/21/23  PT to be I in HEP to improve strength by 1 grade to decrease falls and be able to get up off the floor with increased ease.  Baseline:  Goal status: IN PROGRESS  2.  PT to be able to single leg stance for 20 seconds for decreased fall risk on uneven ground Baseline:  Goal status: IN PROGRESS  3.  PT to be ambulating in the community  Baseline:  Goal status: IN PROGRESS   ASSESSMENT:  CLINICAL IMPRESSION: Reviewed goals and educated importance of HEP compliance, pt able to recall and complete with min cueing to address  form with current exercise program.  Pt c/o dizziness at beginning of session, monitored through session.  Added gluteal strengthening and balance activities which pt liked, added to HEP with printout given.  Therapist adjusted QC for easier clearance with gait mechanics.  No reports of pain through session.  Pt encouraged to contact MD concerning Lt chest pain and wishes to begin OT for Lt hand weakness.  OBJECTIVE IMPAIRMENTS: Abnormal gait, decreased activity tolerance, decreased balance, decreased endurance, decreased mobility, difficulty walking, decreased strength, impaired UE functional use, and pain.   ACTIVITY LIMITATIONS: carrying, lifting, bending, standing, squatting, stairs, bathing, dressing, and locomotion level  PARTICIPATION LIMITATIONS: meal prep, cleaning, laundry, driving, shopping, community activity, occupation, and yard work  PERSONAL FACTORS: Fitness and 3+ comorbidities: COPD, dM, depression   are also affecting patient's functional outcome.   REHAB POTENTIAL: Good  CLINICAL DECISION MAKING: Stable/uncomplicated  EVALUATION COMPLEXITY: Moderate  PLAN:  PT FREQUENCY: 2x/week  PT DURATION: 6 weeks  PLANNED INTERVENTIONS: Therapeutic exercises, Therapeutic activity, Neuromuscular re-education, Balance training, Gait training, Patient/Family education, and Self Care  PLAN FOR NEXT SESSION: Continue with strengthening and balance activity.   Ihor Austin, LPTA/CLT; Delana Meyer [phone]   12/16/2022

## 2022-12-17 ENCOUNTER — Emergency Department (HOSPITAL_COMMUNITY): Payer: Medicaid Other

## 2022-12-17 ENCOUNTER — Encounter (HOSPITAL_COMMUNITY): Payer: Self-pay

## 2022-12-17 ENCOUNTER — Other Ambulatory Visit: Payer: Self-pay

## 2022-12-17 ENCOUNTER — Emergency Department (HOSPITAL_COMMUNITY)
Admission: EM | Admit: 2022-12-17 | Discharge: 2022-12-17 | Disposition: A | Discharge status: Home / Self Care | Payer: Medicaid Other | Attending: Emergency Medicine | Admitting: Emergency Medicine

## 2022-12-17 DIAGNOSIS — Z91040 Latex allergy status: Secondary | ICD-10-CM | POA: Diagnosis not present

## 2022-12-17 DIAGNOSIS — Z8673 Personal history of transient ischemic attack (TIA), and cerebral infarction without residual deficits: Secondary | ICD-10-CM | POA: Diagnosis not present

## 2022-12-17 DIAGNOSIS — R0789 Other chest pain: Secondary | ICD-10-CM | POA: Diagnosis not present

## 2022-12-17 DIAGNOSIS — Z7901 Long term (current) use of anticoagulants: Secondary | ICD-10-CM | POA: Diagnosis not present

## 2022-12-17 DIAGNOSIS — I10 Essential (primary) hypertension: Secondary | ICD-10-CM | POA: Diagnosis not present

## 2022-12-17 DIAGNOSIS — E119 Type 2 diabetes mellitus without complications: Secondary | ICD-10-CM | POA: Insufficient documentation

## 2022-12-17 DIAGNOSIS — Z7984 Long term (current) use of oral hypoglycemic drugs: Secondary | ICD-10-CM | POA: Diagnosis not present

## 2022-12-17 DIAGNOSIS — Z794 Long term (current) use of insulin: Secondary | ICD-10-CM | POA: Diagnosis not present

## 2022-12-17 DIAGNOSIS — J449 Chronic obstructive pulmonary disease, unspecified: Secondary | ICD-10-CM | POA: Diagnosis not present

## 2022-12-17 DIAGNOSIS — R079 Chest pain, unspecified: Secondary | ICD-10-CM | POA: Diagnosis not present

## 2022-12-17 DIAGNOSIS — Z79899 Other long term (current) drug therapy: Secondary | ICD-10-CM | POA: Diagnosis not present

## 2022-12-17 LAB — CBC WITH DIFFERENTIAL/PLATELET
Abs Immature Granulocytes: 0.01 10*3/uL (ref 0.00–0.07)
Basophils Absolute: 0 10*3/uL (ref 0.0–0.1)
Basophils Relative: 1 %
Eosinophils Absolute: 0.1 10*3/uL (ref 0.0–0.5)
Eosinophils Relative: 2 %
HCT: 39.2 % (ref 36.0–46.0)
Hemoglobin: 12.9 g/dL (ref 12.0–15.0)
Immature Granulocytes: 0 %
Lymphocytes Relative: 39 %
Lymphs Abs: 1.8 10*3/uL (ref 0.7–4.0)
MCH: 30.6 pg (ref 26.0–34.0)
MCHC: 32.9 g/dL (ref 30.0–36.0)
MCV: 92.9 fL (ref 80.0–100.0)
Monocytes Absolute: 0.3 10*3/uL (ref 0.1–1.0)
Monocytes Relative: 6 %
Neutro Abs: 2.4 10*3/uL (ref 1.7–7.7)
Neutrophils Relative %: 52 %
Platelets: 370 10*3/uL (ref 150–400)
RBC: 4.22 MIL/uL (ref 3.87–5.11)
RDW: 12.3 % (ref 11.5–15.5)
WBC: 4.7 10*3/uL (ref 4.0–10.5)
nRBC: 0 % (ref 0.0–0.2)

## 2022-12-17 LAB — COMPREHENSIVE METABOLIC PANEL
ALT: 29 U/L (ref 0–44)
AST: 24 U/L (ref 15–41)
Albumin: 3.8 g/dL (ref 3.5–5.0)
Alkaline Phosphatase: 47 U/L (ref 38–126)
Anion gap: 11 (ref 5–15)
BUN: 11 mg/dL (ref 6–20)
CO2: 21 mmol/L — ABNORMAL LOW (ref 22–32)
Calcium: 8.8 mg/dL — ABNORMAL LOW (ref 8.9–10.3)
Chloride: 102 mmol/L (ref 98–111)
Creatinine, Ser: 0.66 mg/dL (ref 0.44–1.00)
GFR, Estimated: >60 mL/min (ref >60)
Glucose, Bld: 318 mg/dL — ABNORMAL HIGH (ref 70–99)
Potassium: 4 mmol/L (ref 3.5–5.1)
Sodium: 134 mmol/L — ABNORMAL LOW (ref 135–145)
Total Bilirubin: 1.1 mg/dL (ref 0.3–1.2)
Total Protein: 7.3 g/dL (ref 6.5–8.1)

## 2022-12-17 LAB — LIPASE, BLOOD: Lipase: 42 U/L (ref 11–51)

## 2022-12-17 LAB — TROPONIN I (HIGH SENSITIVITY)
Troponin I (High Sensitivity): 2 ng/L (ref <18)
Troponin I (High Sensitivity): 2 ng/L (ref <18)

## 2022-12-17 MED ORDER — ONDANSETRON HCL 4 MG/2ML IJ SOLN: 4.0000 mg | Freq: Once | INTRAMUSCULAR | Status: DC

## 2022-12-17 MED ORDER — ONDANSETRON 4 MG PO TBDP
4.0000 mg | ORAL_TABLET | Freq: Once | ORAL | Status: AC
  Administered 2022-12-17: 4 mg via ORAL
  Filled 2022-12-17: qty 1

## 2022-12-17 MED ORDER — AMLODIPINE BESYLATE 5 MG PO TABS
5.0000 mg | ORAL_TABLET | Freq: Once | ORAL | Status: AC
  Administered 2022-12-17: 5 mg via ORAL
  Filled 2022-12-17: qty 1

## 2022-12-17 MED ORDER — AMLODIPINE BESYLATE 5 MG PO TABS: 5.0000 mg | ORAL_TABLET | Freq: Every day | ORAL | 0 refills | Status: DC

## 2022-12-17 MED ORDER — MORPHINE SULFATE (PF) 4 MG/ML IV SOLN
4.0000 mg | Freq: Once | INTRAVENOUS | Status: AC
  Administered 2022-12-17: 4 mg via INTRAMUSCULAR
  Filled 2022-12-17: qty 1

## 2022-12-17 MED ORDER — HYDROCODONE-ACETAMINOPHEN 5-325 MG PO TABS: 1.0000 | ORAL_TABLET | ORAL | 0 refills | Status: DC | PRN

## 2022-12-17 MED ORDER — MORPHINE SULFATE (PF) 4 MG/ML IV SOLN: 4.0000 mg | Freq: Once | INTRAVENOUS | Status: DC

## 2022-12-17 NOTE — Discharge Instructions (Addendum)
Take your blood pressure once a day and write down the number.  Take this with you to your next doctor's appt.

## 2022-12-17 NOTE — ED Notes (Signed)
Assisted pt. To bathroom. Ambulated well with walker.

## 2022-12-17 NOTE — ED Triage Notes (Signed)
Pt presents with chest pain and bilateral arm pain that started yesterday. Pt has previous stroke in December. Describes pain as dull and located in the middle on the chest. Pain is constant.  Pt BP 207/96 and takes BP medication at home.

## 2022-12-17 NOTE — ED Provider Notes (Signed)
[facility] EMERGENCY DEPARTMENT Provider Note   CSN: [id number] Arrival date & time: 12/17/22  1660     History  Chief Complaint  Patient presents with   Chest Pain    Kayla Choi is a 57 y.o. female.  Pt is a 57 yo female with a pmhx significant for htn, copd, hld, anxiety, anemia, depression, ad, dm, and cva on 12/25.  The CVA has left her with left sided weakness.  She was in PT yesterday and developed right sided CP.  She said the pain is dull and does not go away.  She denies any sob, n/v.  Chest hurts when its pressed and when she moves her right arm.  She has been doing more with her right arm due to left sided weakness.       Home Medications Prior to Admission medications   Medication Sig Start Date End Date Taking? Authorizing Provider  acetaminophen (TYLENOL) 325 MG tablet Take 325 mg by mouth every 6 (six) hours as needed for moderate pain or headache.    Yes [provider]  albuterol (PROVENTIL) (2.5 MG/3ML) 0.083% nebulizer solution Take 3 mLs (2.5 mg total) by nebulization every 4 (four) hours as needed for wheezing or shortness of breath. 04/23/20  Yes Bates, Crystal A, FNP  amLODipine (NORVASC) 5 MG tablet Take 1 tablet (5 mg total) by mouth daily. 12/17/22  Yes Isla Pence, MD  clopidogrel (PLAVIX) 75 MG tablet Take 1 tablet (75 mg total) by mouth daily. 12/02/22 03/02/23 Yes Shah, Pratik D, DO  dapagliflozin propanediol (FARXIGA) 10 MG TABS tablet Take 10 mg by mouth daily before breakfast. 03/30/20  Yes [place], Modena Nunnery, MD  gabapentin (NEURONTIN) 300 MG capsule Take 300 mg by mouth 3 (three) times daily. 10/09/22  Yes [provider]  glipiZIDE (GLUCOTROL) 10 MG tablet Take 1 tablet (10 mg total) by mouth 2 (two) times daily with a meal. 06/18/21  Yes Pickard, Cammie Mcgee, MD  HUMULIN R 100 UNIT/ML injection Inject into the skin. Inject 11 units before breakfast and 5 units before lunch and dinner. 10/14/22  Yes [provider]   HYDROcodone-acetaminophen (NORCO/VICODIN) 5-325 MG tablet Take 1 tablet by mouth every 4 (four) hours as needed. 12/17/22  Yes Isla Pence, MD  LEVEMIR FLEXPEN 100 UNIT/ML FlexPen Inject 10 Units into the skin at bedtime. 10/14/22  Yes [provider]  lisinopril-hydrochlorothiazide (ZESTORETIC) 20-12.5 MG tablet TAKE ONE TABLET BY MOUTH ONCE DAILY. 01/11/21  Yes [place], Modena Nunnery, MD  pioglitazone (ACTOS) 45 MG tablet Take 45 mg by mouth daily. 10/09/22  Yes [provider]  rosuvastatin (CRESTOR) 20 MG tablet Take 1 tablet (20 mg total) by mouth at bedtime. Patient taking differently: Take 10 mg by mouth at bedtime. 03/29/20  Yes [place], Modena Nunnery, MD  Tiotropium Bromide Monohydrate (SPIRIVA RESPIMAT) 1.25 MCG/ACT AERS Inhale 2 puffs into the lungs daily. 03/23/20  Yes [place], Modena Nunnery, MD  albuterol (VENTOLIN HFA) 108 (90 Base) MCG/ACT inhaler Inhale 2 puffs into the lungs every 4 (four) hours as needed for wheezing or shortness of breath. Patient not taking: Reported on 12/17/2022 03/23/20   Alycia Rossetti, MD  CRANBERRY PO Take 2 tablets by mouth every other day. Patient not taking: Reported on 12/17/2022    [provider]  Dulaglutide (TRULICITY) 6.30 ZS/0.1UX SOPN INJECT THE CONTENTS OF 1 PEN INTO THE SKIN ONCE WEEKLY. Patient not taking: Reported on 12/17/2022 06/18/21   Susy Frizzle, MD  loratadine (CLARITIN) 10 MG  tablet Take 1 tablet (10 mg total) by mouth daily. Patient not taking: Reported on 12/17/2022 04/23/20   Annie Main, FNP  Phenyleph-Doxylamine-DM-APAP (ALKA SELTZER PLUS PO) Take 2 tablets by mouth daily as needed (congestion). Patient not taking: Reported on 12/17/2022    [provider]      Allergies    Aspirin, Metformin and related, Other, Oxycodone, Latex, and Neosporin [neomycin-bacitracin zn-polymyx]    Review of Systems   Review of Systems  Cardiovascular:  Positive for chest pain.  All other systems reviewed and are  negative.   Physical Exam Updated Vital Signs BP (!) 179/77   Pulse 83   Temp 98.4 F (36.9 C)   Resp 13   Ht '5\' 1"'$  (1.549 m)   Wt 85.7 kg   SpO2 97%   BMI 35.71 kg/m  Physical Exam Vitals and nursing note reviewed.  Constitutional:      Appearance: She is well-developed.  HENT:     Head: Normocephalic and atraumatic.  Eyes:     Extraocular Movements: Extraocular movements intact.     Pupils: Pupils are equal, round, and reactive to light.  Cardiovascular:     Rate and Rhythm: Normal rate and regular rhythm.     Heart sounds: Normal heart sounds.  Pulmonary:     Effort: Pulmonary effort is normal.     Breath sounds: Normal breath sounds.  Chest:    Abdominal:     General: Bowel sounds are normal.     Palpations: Abdomen is soft.  Musculoskeletal:        General: Normal range of motion.     Cervical back: Normal range of motion and neck supple.  Skin:    General: Skin is warm.     Capillary Refill: Capillary refill takes less than 2 seconds.  Neurological:     General: No focal deficit present.     Mental Status: She is alert and oriented to person, place, and time.  Psychiatric:        Mood and Affect: Mood normal.        Behavior: Behavior normal.     ED Results / Procedures / Treatments   Labs (all labs ordered are listed, but only abnormal results are displayed) Labs Reviewed  COMPREHENSIVE METABOLIC PANEL - Abnormal; Notable for the following components:      Result Value   Sodium 134 (*)    CO2 21 (*)    Glucose, Bld 318 (*)    Calcium 8.8 (*)    All other components within normal limits  LIPASE, BLOOD  CBC WITH DIFFERENTIAL/PLATELET  TROPONIN I (HIGH SENSITIVITY)  TROPONIN I (HIGH SENSITIVITY)    EKG EKG Interpretation  Date/Time:  Wednesday December 17 2022 06:42:22 EST Ventricular Rate:  88 PR Interval:  108 QRS Duration: 76 QT Interval:  364 QTC Calculation: 441 R Axis:   60 Text Interpretation: Sinus rhythm Short PR interval  Borderline T abnormalities, inferior leads No significant change since 02/25/2019 Confirmed by Veryl Speak [phone]) on 12/17/2022 6:51:26 AM  Radiology DG Chest Port 1 View  Result Date: 12/17/2022 CLINICAL DATA:  57 year old female with chest pain, arm pain since yesterday. EXAM: PORTABLE CHEST 1 VIEW COMPARISON:  Chest radiographs 02/06/2019. FINDINGS: Portable AP semi upright view at 0717 hours. Lung volumes and mediastinal contours are stable and within normal limits. Visualized tracheal air column is within normal limits. Allowing for portable technique the lungs are clear. No pneumothorax or pleural effusion. No acute osseous abnormality identified.  Negative visible bowel gas. IMPRESSION: Negative portable chest. Electronically Signed   By: Genevie Ann M.D.   On: 12/17/2022 07:27    Procedures Procedures    Medications Ordered in ED Medications  morphine (PF) 4 MG/ML injection 4 mg (4 mg Intramuscular Given 12/17/22 0837)  ondansetron (ZOFRAN-ODT) disintegrating tablet 4 mg (4 mg Oral Given 12/17/22 0837)  amLODipine (NORVASC) tablet 5 mg (5 mg Oral Given 12/17/22 0908)    ED Course/ Medical Decision Making/ A&P                           Medical Decision Making Risk Prescription drug management.   This patient presents to the ED for concern of cp, this involves an extensive number of treatment options, and is a complaint that carries with it a high risk of complications and morbidity.  The differential diagnosis includes cardiac, pulm, gi, and msk   Co morbidities that complicate the patient evaluation  htn, copd, hld, anxiety, anemia, depression, ad, dm, and cva    Additional history obtained:  Additional history obtained from epic chart review External records from outside source obtained and reviewed including BF   Lab Tests:  I Ordered, and personally interpreted labs.  The pertinent results include:  cbc nl, cmp nl other than glucose elevated at 318, lip 42, trop  2   Imaging Studies ordered:  I ordered imaging studies including cxr  I independently visualized and interpreted imaging which showed Negative portable chest.  I agree with the radiologist interpretation   Cardiac Monitoring:  The patient was maintained on a cardiac monitor.  I personally viewed and interpreted the cardiac monitored which showed an underlying rhythm of: nsr   Medicines ordered and prescription drug management:  I ordered medication including morphine  for pain  Reevaluation of the patient after these medicines showed that the patient improved I have reviewed the patients home medicines and have made adjustments as needed   Problem List / ED Course:  CP:  pt certainly has all the risk factors for cardiac chest pain.  However, I think her pain is msk.  It hurts to touch and to move.  She has been using her right side more.  I think she may have strained it in PT.   EKG nl.  2 troponins nl and pain has been going on since 1900 last night. HTN:  pt remains hypertensive in the ED.  She takes zestoretic in the evening and said she took it last night.  I will add amlodipine to her bp regimen to try to get her bp under control.  Pt does have an appt with her pcp on the 12th.  She is to take her bp at home and let her pcp know how it's been doing.   Reevaluation:  After the interventions noted above, I reevaluated the patient and found that they have :improved   Social Determinants of Health:  Lives at home   Dispostion:  After consideration of the diagnostic results and the patients response to treatment, I feel that the patent would benefit from discharge with outpatient f/u.          Final Clinical Impression(s) / ED Diagnoses Final diagnoses:  Atypical chest pain  Hypertension, unspecified type    Rx / DC Orders ED Discharge Orders          Ordered    amLODipine (NORVASC) 5 MG tablet  Daily  12/17/22 0944    HYDROcodone-acetaminophen  (NORCO/VICODIN) 5-325 MG tablet  Every 4 hours PRN        12/17/22 0944              Isla Pence, MD 12/17/22 [phone]

## 2022-12-17 NOTE — ED Notes (Signed)
EDP at bedside  

## 2022-12-18 ENCOUNTER — Ambulatory Visit (HOSPITAL_COMMUNITY): Payer: Medicaid Other

## 2022-12-18 ENCOUNTER — Encounter (HOSPITAL_COMMUNITY): Payer: Self-pay

## 2022-12-18 DIAGNOSIS — R262 Difficulty in walking, not elsewhere classified: Secondary | ICD-10-CM

## 2022-12-18 DIAGNOSIS — M6281 Muscle weakness (generalized): Secondary | ICD-10-CM | POA: Diagnosis not present

## 2022-12-18 DIAGNOSIS — R200 Anesthesia of skin: Secondary | ICD-10-CM | POA: Diagnosis not present

## 2022-12-18 NOTE — Therapy (Signed)
OUTPATIENT PHYSICAL THERAPY NEURO TREATMENT   Patient Name: Kayla Choi MRN: [id number] DOB:[birth year], 57 y.o., female Today's Date: 12/18/2022   PCP: [facility] PROVIDER: Rodena Goldmann, DO  END OF SESSION:  PT End of Session - 12/18/22 1119     Visit Number 3    Number of Visits 12    Date for PT Re-Evaluation 01/21/23    Authorization Type healthy blue    Authorization Time Period 5 visits from 01/03-->02/07/23    Authorization - Visit Number 2    Authorization - Number of Visits 5    Progress Note Due on Visit 5    PT Start Time 5035    PT Stop Time 1115    PT Time Calculation (min) 40 min    Activity Tolerance Patient tolerated treatment well;Patient limited by fatigue    Behavior During Therapy [facility] for tasks assessed/performed               Past Medical History:  Diagnosis Date   Acid reflux    ADD (attention deficit disorder)    Anemia    iron deficinecy   Anxiety    with social phobia   Asthma    Balance problem    Chest pain    that occurs with anxiety   COPD (chronic obstructive pulmonary disease) (HCC)    Depression    Diabetes mellitus    Glaucoma    Hyperlipidemia    Hypertension    Normal cardiac stress test 12/2015   low risk study   Panic attack    Prediabetes    Recurrent falls    Social phobia    Past Surgical History:  Procedure Laterality Date   ABDOMINAL HYSTERECTOMY     fibroids, both ovaries left intact   ANKLE SURGERY     CHOLECYSTECTOMY     COLONOSCOPY N/A 10/18/2019   Procedure: COLONOSCOPY;  Surgeon: Daneil Dolin, MD;  [facility];  Service: Endoscopy;  Laterality: N/A;  1:00-office rescheduled to 11/10 @ 12:45pm   COSMETIC SURGERY     elbow   FRACTURE SURGERY     Recurrent elbow surgery   LIPOMA EXCISION  04/30/2012   Procedure: EXCISION LIPOMA;  Surgeon: Donato Heinz, MD;  Location: [facility];  Service: General;  Laterality: Right;  Excision soft tissue mass right thigh   MASS  EXCISION Right 12/19/2020   Procedure: EXCISION CYST; 2 CM; AXILLA;  Surgeon: Virl Cagey, MD;  Location: [facility];  Service: General;  Laterality: Right;   ORIF ANKLE FRACTURE Right 12/15/2017   Procedure: OPEN REDUCTION INTERNAL FIXATION (ORIF) RIGHT ANKLE FRACTURE;  Surgeon: Renette Butters, MD;  Location: [place];  Service: Orthopedics;  Laterality: Right;   POLYPECTOMY  10/18/2019   Procedure: POLYPECTOMY;  Surgeon: Daneil Dolin, MD;  [facility];  Service: Endoscopy;;  colon   right elbow     reconstruction   Patient Active Problem List   Diagnosis Date Noted   Left sided numbness 12/01/2022   Class 2 obesity 09/02/2017   Recurrent falls 05/13/2016   Sebaceous cyst of right axilla 03/10/2016   Onychomycosis of toenail 02/28/2015   Knee pain 10/06/2014   Neck muscle spasm 07/02/2014   Back pain 10/23/2013   Headache 10/05/2012   Inadequate social support 06/25/2012   Microalbuminuria 06/25/2012   Diabetes mellitus, type II ([place]) 04/02/2012   Keloid scar of skin 09/03/2011   COPD (chronic obstructive pulmonary disease) ([place])  05/21/2007   Hyperlipidemia 01/29/2007   Anxiety state 11/02/2006   MDD (major depressive disorder) 11/02/2006   Essential hypertension 11/02/2006    ONSET DATE: 12/02/2022  REFERRING DIAG: R20.0 (ICD-10-CM) - Left sided numbness  THERAPY DIAG:  Muscle weakness (generalized)  Difficulty in walking, not elsewhere classified  Rationale for Evaluation and Treatment: Rehabilitation  SUBJECTIVE:                                                                                                                                                                                             SUBJECTIVE STATEMENT: 12/18/22:  Pt reports she feels her balance is improving, no reports of fall this morning.  Stated she has began sitting on EOB for a couple minutes to adjust to dizziness that seems to help.  Reports she went to ER  yesterday for Rt chest pain, stated she has been more her Rt arm more.  No reports of pain today.    Eval subjective:  PT states that she is having difficulty standing , walking and dressing.  She can stand for about 20 minutes, She only walks in her home for short periods of time due to not  venturing out.  She has lost her balance several time and fallen four times since discharge and it takes her 45 minutes to get up.  She lives by herself but her boyfriend is there 60% of the time  Pt accompanied by: family member  PERTINENT HISTORY: Kayla Choi is a 57 y.o. female with medical history significant of acid reflux, anxiety, COPD, depression, diabetes mellitus type 2, hyperlipidemia, hypertension, and more who presented to the  ED on 12/26  with a chief complaint of left-sided numbness.  Patient was apparently driving home when it started. She reports that she had associated blurry vision, slurred speech, dysphagia. Her daughter noticed her left facial droop. Patient reports that she had weakness in her left upper extremity, and dropped her purse. She had difficulty with ambulation due to dizziness. She had numbness in her face as well. Patient reports that most of the weakness is gone, but the numbness is still present.  PAIN:  Are you having pain? No pain today.  Yes: NPRS scale: 0/10  Pain location: toe nails are hurting as well as chronic back.  Therapist explained we would not be focusing on either of these but hopefully as she gets stronger she will have less pain.  Pain description: tingling  Aggravating factors: not sure Relieving factors: tylenol   PRECAUTIONS: Fall  WEIGHT BEARING RESTRICTIONS: No  FALLS: Has patient fallen in last 6 months? Yes; 4 times she  is not sure if she is losing her balance or if her Lt leg is going out l    LIVING ENVIRONMENT: Lives with: lives alone Lives in: House/apartment Stairs: to enter the house but there is a ramp  Has following equipment at  home: None  PLOF: Independent  PATIENT GOALS: Pt wants to get back to normal   OBJECTIVE:   DIAGNOSTIC FINDINGS: IMPRESSION: MRI brain:   1. 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 2. Abnormal T1 hypointense marrow signal within the calvarium and visualized upper cervical spine. While this findings can reflect a marrow infiltrative process, the most common causes include chronic anemia, smoking and obesity. 3. Bilateral proptosis.   MRA head:   1. No intracranial large vessel occlusion. 2. 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 3. Moderate focal stenosis within the left PCA P2 segment.     Electronically Signed   By: Kellie Simmering D.O.   On: 12/02/2022 10:13  COGNITION: Overall cognitive status: Within functional limits for tasks assessed   SENSATION: PT reports tingling in the left side of her body.    LOWER EXTREMITY MMT:    MMT Right Eval Left Eval  Hip flexion 3 2+  Hip extension 3- 2+  Hip abduction 5 3-  Hip adduction    Hip internal rotation    Hip external rotation    Knee flexion 3- 3  Knee extension 5 4  Ankle dorsiflexion 5 4+  Ankle plantarflexion    Ankle inversion    Ankle eversion    (Blank rows = not tested)  BED MOBILITY:  Mod I     FUNCTIONAL TESTS:  30 seconds chair stand test  4 Single leg stance:  Rt: 7" , LT: 4" 2 minute walk test : 121 ft. No assistive device decreased step length B, decreased ankle and knee motion on the LT>   TODAY'S TREATMENT:                                                                                                                              DATE:  12/18/22: 2MWT 245f with QC Heel raise incline slope 15 Toe raises decline slope 15x 10 STS Squat 10x front of chair, cueing for mechanics Toe tapping 8in step 2sets 10x with less UE support  Opposite UE/LE toe tapping 2sets  Sidestep 4RT total, 2 RT with GTB around  thigh Tandem stance 2x 30" no HHA SLS 3x intermittent HHA 30" holds Vector stance 5x5"  12/16/22: Reviewed goals Educated importance of HEP compliance STS Standing:  Minisquat (cueing for mechanics) 10x front of chair Heel/ toe raises 10x each Sidestep inside //bars 2RT March 10x alternating with HHA Seated: LAQ 10x Supine: Bridge 10x Sidelying: Abduction 10x    12/10/22: Evaluation : Sitting: LAQ x 10 Sit to stand x 10 Supine : Bridge x 10   Stand: Minisquat x 5  PATIENT EDUCATION: Education details: HEP Person educated: Patient Education method: Explanation, Verbal cues, and Handouts Education comprehension: verbalized understanding and returned demonstration  HOME EXERCISE PROGRAM: Access Code: [email or web address] URL: [email or web address] Date: 12/10/2022 Prepared by: Rayetta Humphrey  Exercises - Sit to Stand Without Arm Support  - 2 x daily - 7 x weekly - 1 sets - 10 reps - 2 hold - Seated Long Arc Quad  - 2 x daily - 7 x weekly - 1 sets - 10 reps - 5" hold - Mini Squat with Counter Support  - 2 x daily - 7 x weekly - 1 sets - 10 reps - 5" hold - Supine Bridge  - 2 x daily - 7 x weekly - 1 sets - 10 reps - 5" hold  12/16/22 Prepared by: Ihor Austin Sidelying ab  12/18/22: sidestep front of counter   GOALS: Goals reviewed with patient? No  SHORT TERM GOALS: Target date: 12/31/22  PT to be I in HEP to improve strength by 1/2 grade to decrease falls  Baseline: Goal status: IN PROGRESS  2.  PT to be able to single leg stance for 10 seconds for decreased fall risk in the home  Baseline:  Goal status: IN PROGRESS  3.  PT to be able to stand for an hour to cook a full meal  Baseline:  Goal status: IN PROGRESS    LONG TERM GOALS: Target date: 01/21/23  PT to be I in HEP to improve strength by 1 grade to decrease falls and be able to get up off the floor with increased ease.  Baseline:  Goal status: IN PROGRESS  2.  PT to be able to single  leg stance for 20 seconds for decreased fall risk on uneven ground Baseline:  Goal status: IN PROGRESS  3.  PT to be ambulating in the community  Baseline:  Goal status: IN PROGRESS   ASSESSMENT:  CLINICAL IMPRESSION: Session focus with balance and functional strengthening.  Increased challenge this session with coordination activities to improve UE and opposite LE sequence for core, balance and gait mechanics with min cueing for techniques and min guard for safety.  No reports of pain through session, was limited by fatigue with activity.   OBJECTIVE IMPAIRMENTS: Abnormal gait, decreased activity tolerance, decreased balance, decreased endurance, decreased mobility, difficulty walking, decreased strength, impaired UE functional use, and pain.   ACTIVITY LIMITATIONS: carrying, lifting, bending, standing, squatting, stairs, bathing, dressing, and locomotion level  PARTICIPATION LIMITATIONS: meal prep, cleaning, laundry, driving, shopping, community activity, occupation, and yard work  PERSONAL FACTORS: Fitness and 3+ comorbidities: COPD, dM, depression   are also affecting patient's functional outcome.   REHAB POTENTIAL: Good  CLINICAL DECISION MAKING: Stable/uncomplicated  EVALUATION COMPLEXITY: Moderate  PLAN:  PT FREQUENCY: 2x/week  PT DURATION: 6 weeks  PLANNED INTERVENTIONS: Therapeutic exercises, Therapeutic activity, Neuromuscular re-education, Balance training, Gait training, Patient/Family education, and Self Care  PLAN FOR NEXT SESSION: Continue with strengthening and balance activity.   Ihor Austin, LPTA/CLT; [facility] [phone]   12/18/2022

## 2022-12-19 DIAGNOSIS — L910 Hypertrophic scar: Secondary | ICD-10-CM | POA: Diagnosis not present

## 2022-12-23 ENCOUNTER — Ambulatory Visit (HOSPITAL_COMMUNITY): Payer: Medicaid Other

## 2022-12-23 DIAGNOSIS — R262 Difficulty in walking, not elsewhere classified: Secondary | ICD-10-CM

## 2022-12-23 DIAGNOSIS — R200 Anesthesia of skin: Secondary | ICD-10-CM | POA: Diagnosis not present

## 2022-12-23 DIAGNOSIS — M6281 Muscle weakness (generalized): Secondary | ICD-10-CM

## 2022-12-23 NOTE — Therapy (Signed)
OUTPATIENT PHYSICAL THERAPY NEURO TREATMENT   Patient Name: Kayla Choi MRN: [id number] DOB:05-Dec-1966, 57 y.o., female Today's Date: 12/23/2022   PCP: [facility] PROVIDER: Rodena Goldmann, DO  END OF SESSION:  PT End of Session - 12/23/22 0905     Visit Number 4    Number of Visits 12    Date for PT Re-Evaluation 01/21/23    Authorization Type healthy blue    Authorization Time Period 5 visits from 01/03-->02/07/23    Authorization - Visit Number 3    Authorization - Number of Visits 5    Progress Note Due on Visit 5    PT Start Time 0904    PT Stop Time 0944    PT Time Calculation (min) 40 min    Activity Tolerance Patient tolerated treatment well;Patient limited by fatigue    Behavior During Therapy [facility] for tasks assessed/performed               Past Medical History:  Diagnosis Date   Acid reflux    ADD (attention deficit disorder)    Anemia    iron deficinecy   Anxiety    with social phobia   Asthma    Balance problem    Chest pain    that occurs with anxiety   COPD (chronic obstructive pulmonary disease) (HCC)    Depression    Diabetes mellitus    Glaucoma    Hyperlipidemia    Hypertension    Normal cardiac stress test 12/2015   low risk study   Panic attack    Prediabetes    Recurrent falls    Social phobia    Past Surgical History:  Procedure Laterality Date   ABDOMINAL HYSTERECTOMY     fibroids, both ovaries left intact   ANKLE SURGERY     CHOLECYSTECTOMY     COLONOSCOPY N/A 10/18/2019   Procedure: COLONOSCOPY;  Surgeon: Daneil Dolin, MD;  [facility];  Service: Endoscopy;  Laterality: N/A;  1:00-office rescheduled to 11/10 @ 12:45pm   COSMETIC SURGERY     elbow   FRACTURE SURGERY     Recurrent elbow surgery   LIPOMA EXCISION  04/30/2012   Procedure: EXCISION LIPOMA;  Surgeon: Donato Heinz, MD;  Location[facility];  Service: General;  Laterality: Right;  Excision soft tissue mass right thigh   MASS  EXCISION Right 12/19/2020   Procedure: EXCISION CYST; 2 CM; AXILLA;  Surgeon: Virl Cagey, MD;  Location[facility];  Service: General;  Laterality: Right;   ORIF ANKLE FRACTURE Right 12/15/2017   Procedure: OPEN REDUCTION INTERNAL FIXATION (ORIF) RIGHT ANKLE FRACTURE;  Surgeon: Renette Butters, MD;  Location: [place];  Service: Orthopedics;  Laterality: Right;   POLYPECTOMY  10/18/2019   Procedure: POLYPECTOMY;  Surgeon: Daneil Dolin, MD;  [facility];  Service: Endoscopy;;  colon   right elbow     reconstruction   Patient Active Problem List   Diagnosis Date Noted   Left sided numbness 12/01/2022   Class 2 obesity 09/02/2017   Recurrent falls 05/13/2016   Sebaceous cyst of right axilla 03/10/2016   Onychomycosis of toenail 02/28/2015   Knee pain 10/06/2014   Neck muscle spasm 07/02/2014   Back pain 10/23/2013   Headache 10/05/2012   Inadequate social support 06/25/2012   Microalbuminuria 06/25/2012   Diabetes mellitus, type II ([place]) 04/02/2012   Keloid scar of skin 09/03/2011   COPD (chronic obstructive pulmonary disease) ([place])  05/21/2007   Hyperlipidemia 01/29/2007   Anxiety state 11/02/2006   MDD (major depressive disorder) 11/02/2006   Essential hypertension 11/02/2006    ONSET DATE: 12/02/2022  REFERRING DIAG: R20.0 (ICD-10-CM) - Left sided numbness  THERAPY DIAG:  Muscle weakness (generalized)  Difficulty in walking, not elsewhere classified  Rationale for Evaluation and Treatment: Rehabilitation  SUBJECTIVE:                                                                                                                                                                                             SUBJECTIVE STATEMENT: 12/23/2022 Patient reports some facial numbness still from her stroke Christmas day; feeling whoozy this morning.  States its upsets her that her family does not clean like she does; stresses her out.  No new falls, no  pain today.     Eval subjective:  PT states that she is having difficulty standing , walking and dressing.  She can stand for about 20 minutes, She only walks in her home for short periods of time due to not  venturing out.  She has lost her balance several time and fallen four times since discharge and it takes her 45 minutes to get up.  She lives by herself but her boyfriend is there 60% of the time  Pt accompanied by: family member  PERTINENT HISTORY: Kayla Choi is a 57 y.o. female with medical history significant of acid reflux, anxiety, COPD, depression, diabetes mellitus type 2, hyperlipidemia, hypertension, and more who presented to the  ED on 12/26  with a chief complaint of left-sided numbness.  Patient was apparently driving home when it started. She reports that she had associated blurry vision, slurred speech, dysphagia. Her daughter noticed her left facial droop. Patient reports that she had weakness in her left upper extremity, and dropped her purse. She had difficulty with ambulation due to dizziness. She had numbness in her face as well. Patient reports that most of the weakness is gone, but the numbness is still present.  PAIN:  Are you having pain? No pain today.  Yes: NPRS scale: 0/10  Pain location: toe nails are hurting as well as chronic back.  Therapist explained we would not be focusing on either of these but hopefully as she gets stronger she will have less pain.  Pain description: tingling  Aggravating factors: not sure Relieving factors: tylenol   PRECAUTIONS: Fall  WEIGHT BEARING RESTRICTIONS: No  FALLS: Has patient fallen in last 6 months? Yes; 4 times she is not sure if she is losing her balance or if her Lt leg is going out l  LIVING ENVIRONMENT: Lives with: lives alone Lives in: House/apartment Stairs: to enter the house but there is a ramp  Has following equipment at home: None  PLOF: Independent  PATIENT GOALS: Pt wants to get back to normal    OBJECTIVE:   DIAGNOSTIC FINDINGS: IMPRESSION: MRI brain:   1. 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 2. Abnormal T1 hypointense marrow signal within the calvarium and visualized upper cervical spine. While this findings can reflect a marrow infiltrative process, the most common causes include chronic anemia, smoking and obesity. 3. Bilateral proptosis.   MRA head:   1. No intracranial large vessel occlusion. 2. 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 3. Moderate focal stenosis within the left PCA P2 segment.     Electronically Signed   By: Kellie Simmering D.O.   On: 12/02/2022 10:13  COGNITION: Overall cognitive status: Within functional limits for tasks assessed   SENSATION: PT reports tingling in the left side of her body.    LOWER EXTREMITY MMT:    MMT Right Eval Left Eval  Hip flexion 3 2+  Hip extension 3- 2+  Hip abduction 5 3-  Hip adduction    Hip internal rotation    Hip external rotation    Knee flexion 3- 3  Knee extension 5 4  Ankle dorsiflexion 5 4+  Ankle plantarflexion    Ankle inversion    Ankle eversion    (Blank rows = not tested)  BED MOBILITY:  Mod I     FUNCTIONAL TESTS:  30 seconds chair stand test  4 Single leg stance:  Rt: 7" , LT: 4" 2 minute walk test : 121 ft. No assistive device decreased step length B, decreased ankle and knee motion on the LT>   TODAY'S TREATMENT:                                                                                                                              DATE:  12/23/2022 BP manually 158/88 right arm  Standing: Heel/toe raises on incline 2 x 10 STS x 10 from chair no UE assist Walking over short hurdles x 3 down and back and 2 with CGA Step navigation 4" and 8" over and back x 1 4" box step ups without UE assist x 10 Tandem stance on foam beam 2 x 30" each    12/18/22: 2MWT 262f with QC Heel raise incline  slope 15 Toe raises decline slope 15x 10 STS Squat 10x front of chair, cueing for mechanics Toe tapping 8in step 2sets 10x with less UE support  Opposite UE/LE toe tapping 2sets  Sidestep 4RT total, 2 RT with GTB around thigh Tandem stance 2x 30" no HHA SLS 3x intermittent HHA 30" holds Vector stance 5x5"  12/16/22: Reviewed goals Educated importance of HEP compliance STS Standing:  Minisquat (cueing for mechanics) 10x front of chair Heel/ toe raises  10x each Sidestep inside //bars 2RT March 10x alternating with HHA Seated: LAQ 10x Supine: Bridge 10x Sidelying: Abduction 10x    12/10/22: Evaluation : Sitting: LAQ x 10 Sit to stand x 10 Supine : Bridge x 10   Stand: Minisquat x 5   PATIENT EDUCATION: Education details: HEP Person educated: Patient Education method: Consulting civil engineer, Verbal cues, and Handouts Education comprehension: verbalized understanding and returned demonstration  HOME EXERCISE PROGRAM: Access Code: [email or web address] URL: [email or web address] Date: 12/10/2022 Prepared by: Rayetta Humphrey  Exercises - Sit to Stand Without Arm Support  - 2 x daily - 7 x weekly - 1 sets - 10 reps - 2 hold - Seated Long Arc Quad  - 2 x daily - 7 x weekly - 1 sets - 10 reps - 5" hold - Mini Squat with Counter Support  - 2 x daily - 7 x weekly - 1 sets - 10 reps - 5" hold - Supine Bridge  - 2 x daily - 7 x weekly - 1 sets - 10 reps - 5" hold  12/16/22 Prepared by: Ihor Austin Sidelying ab  12/18/22: sidestep front of counter   GOALS: Goals reviewed with patient? No  SHORT TERM GOALS: Target date: 12/31/22  PT to be I in HEP to improve strength by 1/2 grade to decrease falls  Baseline: Goal status: IN PROGRESS  2.  PT to be able to single leg stance for 10 seconds for decreased fall risk in the home  Baseline:  Goal status: IN PROGRESS  3.  PT to be able to stand for an hour to cook a full meal  Baseline:  Goal status: IN PROGRESS    LONG TERM  GOALS: Target date: 01/21/23  PT to be I in HEP to improve strength by 1 grade to decrease falls and be able to get up off the floor with increased ease.  Baseline:  Goal status: IN PROGRESS  2.  PT to be able to single leg stance for 20 seconds for decreased fall risk on uneven ground Baseline:  Goal status: IN PROGRESS  3.  PT to be ambulating in the community  Baseline:  Goal status: IN PROGRESS   ASSESSMENT:  CLINICAL IMPRESSION: Today's session continued to focus on functional strength, mobility and balance. Checked BP initially due to complaint of feeling lightheaded.   Patient with some difficulty descending 8" step; unable to perform 8" step reciprocal pattern safely; needs CGA/min A for safety.  Good challenge with tandem stance balance on foam.  Patient will benefit from continued skilled therapy services  to address deficits and promote return to optimal function.       OBJECTIVE IMPAIRMENTS: Abnormal gait, decreased activity tolerance, decreased balance, decreased endurance, decreased mobility, difficulty walking, decreased strength, impaired UE functional use, and pain.   ACTIVITY LIMITATIONS: carrying, lifting, bending, standing, squatting, stairs, bathing, dressing, and locomotion level  PARTICIPATION LIMITATIONS: meal prep, cleaning, laundry, driving, shopping, community activity, occupation, and yard work  PERSONAL FACTORS: Fitness and 3+ comorbidities: COPD, dM, depression   are also affecting patient's functional outcome.   REHAB POTENTIAL: Good  CLINICAL DECISION MAKING: Stable/uncomplicated  EVALUATION COMPLEXITY: Moderate  PLAN:  PT FREQUENCY: 2x/week  PT DURATION: 6 weeks  PLANNED INTERVENTIONS: Therapeutic exercises, Therapeutic activity, Neuromuscular re-education, Balance training, Gait training, Patient/Family education, and Self Care  PLAN FOR NEXT SESSION: Continue with strengthening and balance activity.   9:48 AM, 12/23/22 Mariselda Badalamenti Small Shanyn Preisler  MPT [facility] [place] [phone]

## 2022-12-24 ENCOUNTER — Ambulatory Visit (HOSPITAL_COMMUNITY): Payer: Medicaid Other

## 2022-12-25 ENCOUNTER — Ambulatory Visit (HOSPITAL_COMMUNITY): Payer: Medicaid Other | Admitting: Physical Therapy

## 2022-12-25 DIAGNOSIS — R262 Difficulty in walking, not elsewhere classified: Secondary | ICD-10-CM

## 2022-12-25 DIAGNOSIS — R200 Anesthesia of skin: Secondary | ICD-10-CM | POA: Diagnosis not present

## 2022-12-25 DIAGNOSIS — M6281 Muscle weakness (generalized): Secondary | ICD-10-CM

## 2022-12-25 NOTE — Therapy (Signed)
OUTPATIENT PHYSICAL THERAPY NEURO TREATMENT   Patient Name: Kayla Choi MRN: [id number] DOB:[birth year], 57 y.o., female Today's Date: 12/25/2022   PCP: [facility] PROVIDER: Rodena Goldmann, DO  END OF SESSION:  PT End of Session - 12/25/22 0951     Visit Number 5    Number of Visits 12    Date for PT Re-Evaluation 01/21/23    Authorization Type healthy blue    Authorization Time Period 5 visits from 01/03-->02/07/23    Authorization - Visit Number 4    Authorization - Number of Visits 5    Progress Note Due on Visit 5    PT Start Time 0950    PT Stop Time 1030    PT Time Calculation (min) 40 min    Activity Tolerance Patient tolerated treatment well;Patient limited by fatigue    Behavior During Therapy [facility] for tasks assessed/performed               Past Medical History:  Diagnosis Date   Acid reflux    ADD (attention deficit disorder)    Anemia    iron deficinecy   Anxiety    with social phobia   Asthma    Balance problem    Chest pain    that occurs with anxiety   COPD (chronic obstructive pulmonary disease) ([place])    Depression    Diabetes mellitus    Glaucoma    Hyperlipidemia    Hypertension    Normal cardiac stress test 12/2015   low risk study   Panic attack    Prediabetes    Recurrent falls    Social phobia    Past Surgical History:  Procedure Laterality Date   ABDOMINAL HYSTERECTOMY     fibroids, both ovaries left intact   ANKLE SURGERY     CHOLECYSTECTOMY     COLONOSCOPY N/A 10/18/2019   Procedure: COLONOSCOPY;  Surgeon: Daneil Dolin, MD;  [facility];  Service: Endoscopy;  Laterality: N/A;  1:00-office rescheduled to 11/10 @ 12:45pm   COSMETIC SURGERY     elbow   FRACTURE SURGERY     Recurrent elbow surgery   LIPOMA EXCISION  04/30/2012   Procedure: EXCISION LIPOMA;  Surgeon: Donato Heinz, MD;  Location: [facility];  Service: General;  Laterality: Right;  Excision soft tissue mass right thigh   MASS  EXCISION Right 12/19/2020   Procedure: EXCISION CYST; 2 CM; AXILLA;  Surgeon: Virl Cagey, MD;  Location: [facility];  Service: General;  Laterality: Right;   ORIF ANKLE FRACTURE Right 12/15/2017   Procedure: OPEN REDUCTION INTERNAL FIXATION (ORIF) RIGHT ANKLE FRACTURE;  Surgeon: Renette Butters, MD;  Location: [place];  Service: Orthopedics;  Laterality: Right;   POLYPECTOMY  10/18/2019   Procedure: POLYPECTOMY;  Surgeon: Daneil Dolin, MD;  [facility];  Service: Endoscopy;;  colon   right elbow     reconstruction   Patient Active Problem List   Diagnosis Date Noted   Left sided numbness 12/01/2022   Class 2 obesity 09/02/2017   Recurrent falls 05/13/2016   Sebaceous cyst of right axilla 03/10/2016   Onychomycosis of toenail 02/28/2015   Knee pain 10/06/2014   Neck muscle spasm 07/02/2014   Back pain 10/23/2013   Headache 10/05/2012   Inadequate social support 06/25/2012   Microalbuminuria 06/25/2012   Diabetes mellitus, type II ([place]) 04/02/2012   Keloid scar of skin 09/03/2011   COPD (chronic obstructive pulmonary disease) ([place])  05/21/2007   Hyperlipidemia 01/29/2007   Anxiety state 11/02/2006   MDD (major depressive disorder) 11/02/2006   Essential hypertension 11/02/2006    ONSET DATE: 12/02/2022  REFERRING DIAG: R20.0 (ICD-10-CM) - Left sided numbness  THERAPY DIAG:  Muscle weakness (generalized)  Difficulty in walking, not elsewhere classified  Rationale for Evaluation and Treatment: Rehabilitation  SUBJECTIVE:                                                                                                                                                                                             SUBJECTIVE STATEMENT: Patient states no pain and she has been completing her HEP> PERTINENT HISTORY: Kayla Choi is a 57 y.o. female with medical history significant of acid reflux, anxiety, COPD, depression, diabetes mellitus type 2,  hyperlipidemia, hypertension, and more who presented to the  ED on 12/26  with a chief complaint of left-sided numbness.  Patient was apparently driving home when it started. She reports that she had associated blurry vision, slurred speech, dysphagia. Her daughter noticed her left facial droop. Patient reports that she had weakness in her left upper extremity, and dropped her purse. She had difficulty with ambulation due to dizziness. She had numbness in her face as well. Patient reports that most of the weakness is gone, but the numbness is still present.  PAIN:  Are you having pain? No pain today.  Yes: NPRS scale: 0/10  Pain location: toe nails are hurting as well as chronic back.  Therapist explained we would not be focusing on either of these but hopefully as she gets stronger she will have less pain.  Pain description: tingling  Aggravating factors: not sure Relieving factors: tylenol   PRECAUTIONS: Fall  WEIGHT BEARING RESTRICTIONS: No  FALLS: Has patient fallen in last 6 months? Yes; 4 times she is not sure if she is losing her balance or if her Lt leg is going out l    LIVING ENVIRONMENT: Lives with: lives alone Lives in: House/apartment Stairs: to enter the house but there is a ramp  Has following equipment at home: None  PLOF: Independent  PATIENT GOALS: Pt wants to get back to normal   OBJECTIVE:   DIAGNOSTIC FINDINGS: IMPRESSION: MRI brain:   1. 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 2. Abnormal T1 hypointense marrow signal within the calvarium and visualized upper cervical spine. While this findings can reflect a marrow infiltrative process, the most common causes include chronic anemia, smoking and obesity. 3. Bilateral proptosis.   MRA head:   1. No intracranial large vessel occlusion. 2. 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 3. Moderate focal stenosis within the left PCA P2  segment.     Electronically Signed   By: Kellie Simmering D.O.   On: 12/02/2022 10:13  COGNITION: Overall cognitive status: Within functional limits for tasks assessed   SENSATION: PT reports tingling in the left side of her body.    LOWER EXTREMITY MMT:    MMT Right Eval Left Eval  Hip flexion 3 2+  Hip extension 3- 2+  Hip abduction 5 3-  Hip adduction    Hip internal rotation    Hip external rotation    Knee flexion 3- 3  Knee extension 5 4  Ankle dorsiflexion 5 4+  Ankle plantarflexion    Ankle inversion    Ankle eversion    (Blank rows = not tested)  BED MOBILITY:  Mod I     FUNCTIONAL TESTS:  30 seconds chair stand test  4 Single leg stance:  Rt: 7" , LT: 4" 2 minute walk test : 121 ft. No assistive device decreased step length B, decreased ankle and knee motion on the LT>   TODAY'S TREATMENT:                                                                                                                              DATE:  12/25/2022 Standing: Heel raise x 15 Toe raises x 15  Functional squat x 15 Marching x 5 March with opposite arm raise x 5  Single leg stance x 10" each x 5 Lunge onto 6" step B x 10 Sit to stand x 15 Gait no assistive device x 450 ft Tandem gait x 2 RT Leg press 4 pl x 10   12/23/2022 BP manually 158/88 right arm  Standing: Heel/toe raises on incline 2 x 10 STS x 10 from chair no UE assist Walking over short hurdles x 3 down and back and 2 with CGA Step navigation 4" and 8" over and back x 1 4" box step ups without UE assist x 10 Tandem stance on foam beam 2 x 30" each    12/18/22: 2MWT 258f with QC Heel raise incline slope 15 Toe raises decline slope 15x 10 STS Squat 10x front of chair, cueing for mechanics Toe tapping 8in step 2sets 10x with less UE support  Opposite UE/LE toe tapping 2sets  Sidestep 4RT total, 2 RT with GTB around thigh Tandem stance 2x 30" no HHA SLS 3x intermittent HHA 30" holds Vector stance  5x5"  12/16/22: Reviewed goals Educated importance of HEP compliance STS Standing:  Minisquat (cueing for mechanics) 10x front of chair Heel/ toe raises 10x each Sidestep inside //bars 2RT March 10x alternating with HHA Seated: LAQ 10x Supine: Bridge 10x Sidelying: Abduction 10x    12/10/22: Evaluation : Sitting: LAQ x 10 Sit to stand x 10 Supine : Bridge x 10   Stand: Minisquat x 5   PATIENT EDUCATION: Education  details: HEP Person educated: Patient Education method: Explanation, Verbal cues, and Handouts Education comprehension: verbalized understanding and returned demonstration  HOME EXERCISE PROGRAM: Access Code: [email or web address] URL: [email or web address] Date: 12/10/2022 Prepared by: Rayetta Humphrey  Exercises - Sit to Stand Without Arm Support  - 2 x daily - 7 x weekly - 1 sets - 10 reps - 2 hold - Seated Long Arc Quad  - 2 x daily - 7 x weekly - 1 sets - 10 reps - 5" hold - Mini Squat with Counter Support  - 2 x daily - 7 x weekly - 1 sets - 10 reps - 5" hold - Supine Bridge  - 2 x daily - 7 x weekly - 1 sets - 10 reps - 5" hold  12/16/22 Prepared by: Ihor Austin Sidelying ab  12/18/22: sidestep front of counter   GOALS: Goals reviewed with patient? No  SHORT TERM GOALS: Target date: 12/31/22  PT to be I in HEP to improve strength by 1/2 grade to decrease falls  Baseline: Goal status: IN PROGRESS  2.  PT to be able to single leg stance for 10 seconds for decreased fall risk in the home  Baseline:  Goal status: MET  3.  PT to be able to stand for an hour to cook a full meal  Baseline:  Goal status: MET    LONG TERM GOALS: Target date: 01/21/23  PT to be I in HEP to improve strength by 1 grade to decrease falls and be able to get up off the floor with increased ease.  Baseline:  Goal status: IN PROGRESS  2.  PT to be able to single leg stance for 20 seconds for decreased fall risk on uneven ground Baseline:  Goal status: IN  PROGRESS  3.  PT to be ambulating in the community  Baseline:  Goal status: IN PROGRESS   ASSESSMENT:  CLINICAL IMPRESSION: Continued to work on strengthening and balance.  Pt able to ambulate without assistive device inside at this time.  Pt continues to have decreased functional strength, mobility and balance and will continue to benefit from skilled PT.  Pt no longer having light headedness.    Patient with some difficulty descending 8" step; unable to perform 8" step reciprocal pattern safely; needs CGA/min A for safety.  Good challenge with tandem stance balance on foam.  Patient will benefit from continued skilled therapy services  to address deficits and promote return to optimal function.       OBJECTIVE IMPAIRMENTS: Abnormal gait, decreased activity tolerance, decreased balance, decreased endurance, decreased mobility, difficulty walking, decreased strength, impaired UE functional use, and pain.   ACTIVITY LIMITATIONS: carrying, lifting, bending, standing, squatting, stairs, bathing, dressing, and locomotion level  PARTICIPATION LIMITATIONS: meal prep, cleaning, laundry, driving, shopping, community activity, occupation, and yard work  PERSONAL FACTORS: Fitness and 3+ comorbidities: COPD, dM, depression   are also affecting patient's functional outcome.   REHAB POTENTIAL: Good  CLINICAL DECISION MAKING: Stable/uncomplicated  EVALUATION COMPLEXITY: Moderate  PLAN:  PT FREQUENCY: 2x/week  PT DURATION: 6 weeks  PLANNED INTERVENTIONS: Therapeutic exercises, Therapeutic activity, Neuromuscular re-education, Balance training, Gait training, Patient/Family education, and Self Care  PLAN FOR NEXT SESSION: reassess   Rayetta Humphrey, PT CLT [phone]  10:30 AM

## 2022-12-29 ENCOUNTER — Ambulatory Visit (HOSPITAL_COMMUNITY): Payer: Medicaid Other

## 2022-12-29 DIAGNOSIS — M6281 Muscle weakness (generalized): Secondary | ICD-10-CM | POA: Diagnosis not present

## 2022-12-29 DIAGNOSIS — R200 Anesthesia of skin: Secondary | ICD-10-CM | POA: Diagnosis not present

## 2022-12-29 DIAGNOSIS — R262 Difficulty in walking, not elsewhere classified: Secondary | ICD-10-CM

## 2022-12-29 NOTE — Therapy (Signed)
OUTPATIENT PHYSICAL THERAPY NEURO PROGRESS NOTE Progress Note Reporting Period 12/10/2022 to 12/29/2022  See note below for Objective Data and Assessment of Progress/Goals.       Patient Name: Kayla Choi MRN: [id number] DOB:1966/06/12, 57 y.o., female Today's Date: 12/29/2022   PCP: [facility] PROVIDER: Rodena Goldmann, DO  END OF SESSION:  PT End of Session - 12/29/22 0946     Visit Number 6    Number of Visits 12    Date for PT Re-Evaluation 01/21/23    Authorization Type healthy blue    Authorization Time Period 5 visits from 01/03-->02/07/23; submitting for more auth 12/29/22 please check    Authorization - Visit Number 5    Authorization - Number of Visits 5    Progress Note Due on Visit 5    PT Start Time 0950    PT Stop Time 1030    PT Time Calculation (min) 40 min    Activity Tolerance Patient tolerated treatment well;Patient limited by fatigue    Behavior During Therapy [facility] for tasks assessed/performed               Past Medical History:  Diagnosis Date   Acid reflux    ADD (attention deficit disorder)    Anemia    iron deficinecy   Anxiety    with social phobia   Asthma    Balance problem    Chest pain    that occurs with anxiety   COPD (chronic obstructive pulmonary disease) ([place])    Depression    Diabetes mellitus    Glaucoma    Hyperlipidemia    Hypertension    Normal cardiac stress test 12/2015   low risk study   Panic attack    Prediabetes    Recurrent falls    Social phobia    Past Surgical History:  Procedure Laterality Date   ABDOMINAL HYSTERECTOMY     fibroids, both ovaries left intact   ANKLE SURGERY     CHOLECYSTECTOMY     COLONOSCOPY N/A 10/18/2019   Procedure: COLONOSCOPY;  Surgeon: Daneil Dolin, MD;  [facility];  Service: Endoscopy;  Laterality: N/A;  1:00-office rescheduled to 11/10 @ 12:45pm   COSMETIC SURGERY     elbow   FRACTURE SURGERY     Recurrent elbow surgery   LIPOMA  EXCISION  04/30/2012   Procedure: EXCISION LIPOMA;  Surgeon: Donato Heinz, MD;  Location: [facility];  Service: General;  Laterality: Right;  Excision soft tissue mass right thigh   MASS EXCISION Right 12/19/2020   Procedure: EXCISION CYST; 2 CM; AXILLA;  Surgeon: Virl Cagey, MD;  Location: [facility];  Service: General;  Laterality: Right;   ORIF ANKLE FRACTURE Right 12/15/2017   Procedure: OPEN REDUCTION INTERNAL FIXATION (ORIF) RIGHT ANKLE FRACTURE;  Surgeon: Renette Butters, MD;  Location: [place];  Service: Orthopedics;  Laterality: Right;   POLYPECTOMY  10/18/2019   Procedure: POLYPECTOMY;  Surgeon: Daneil Dolin, MD;  [facility];  Service: Endoscopy;;  colon   right elbow     reconstruction   Patient Active Problem List   Diagnosis Date Noted   Left sided numbness 12/01/2022   Class 2 obesity 09/02/2017   Recurrent falls 05/13/2016   Sebaceous cyst of right axilla 03/10/2016   Onychomycosis of toenail 02/28/2015   Knee pain 10/06/2014   Neck muscle spasm 07/02/2014   Back pain 10/23/2013   Headache 10/05/2012   Inadequate  social support 06/25/2012   Microalbuminuria 06/25/2012   Diabetes mellitus, type II ([place]) 04/02/2012   Keloid scar of skin 09/03/2011   COPD (chronic obstructive pulmonary disease) ([place]) 05/21/2007   Hyperlipidemia 01/29/2007   Anxiety state 11/02/2006   MDD (major depressive disorder) 11/02/2006   Essential hypertension 11/02/2006    ONSET DATE: 12/02/2022  REFERRING DIAG: R20.0 (ICD-10-CM) - Left sided numbness  THERAPY DIAG:  Muscle weakness (generalized)  Difficulty in walking, not elsewhere classified  Rationale for Evaluation and Treatment: Rehabilitation  SUBJECTIVE:                                                                                                                                                                                             SUBJECTIVE STATEMENT: About "92%" better.  Reports  she was able to go out to her granddaughters birthday party last weekend without issue. No pain compliants  Patient states no pain and she has been completing her HEP> PERTINENT HISTORY: Kayla Choi is a 57 y.o. female with medical history significant of acid reflux, anxiety, COPD, depression, diabetes mellitus type 2, hyperlipidemia, hypertension, and more who presented to the  ED on 12/26  with a chief complaint of left-sided numbness.  Patient was apparently driving home when it started. She reports that she had associated blurry vision, slurred speech, dysphagia. Her daughter noticed her left facial droop. Patient reports that she had weakness in her left upper extremity, and dropped her purse. She had difficulty with ambulation due to dizziness. She had numbness in her face as well. Patient reports that most of the weakness is gone, but the numbness is still present.  PAIN:  Are you having pain? No pain today.  Yes: NPRS scale: 0/10  Pain location: toe nails are hurting as well as chronic back.  Therapist explained we would not be focusing on either of these but hopefully as she gets stronger she will have less pain.  Pain description: tingling  Aggravating factors: not sure Relieving factors: tylenol   PRECAUTIONS: Fall  WEIGHT BEARING RESTRICTIONS: No  FALLS: Has patient fallen in last 6 months? Yes; 4 times she is not sure if she is losing her balance or if her Lt leg is going out l    LIVING ENVIRONMENT: Lives with: lives alone Lives in: House/apartment Stairs: to enter the house but there is a ramp  Has following equipment at home: None  PLOF: Independent  PATIENT GOALS: Pt wants to get back to normal   OBJECTIVE:   DIAGNOSTIC FINDINGS: IMPRESSION: MRI brain:   1. 1.9 x 1.4 x 2.9 cm acute infarct within the right  corona radiata/basal ganglia. 2. Abnormal T1 hypointense marrow signal within the calvarium and visualized upper cervical spine. While this findings can  reflect a marrow infiltrative process, the most common causes include chronic anemia, smoking and obesity. 3. Bilateral proptosis.   MRA head:   1. No intracranial large vessel occlusion. 2. 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 3. Moderate focal stenosis within the left PCA P2 segment.     Electronically Signed   By: Kellie Simmering D.O.   On: 12/02/2022 10:13  COGNITION: Overall cognitive status: Within functional limits for tasks assessed   SENSATION: PT reports tingling in the left side of her body.    LOWER EXTREMITY MMT:    MMT Right Eval Left Eval Right 12/29/2022 left 12/29/2022  Hip flexion 3 2+ 4+ 4  Hip extension 3- 2+ 3- 3-  Hip abduction 5 3-  4-  Hip adduction      Hip internal rotation      Hip external rotation      Knee flexion 3- '3 4 4  '$ Knee extension '5 4 5 5  '$ Ankle dorsiflexion 5 4+ 5 5  Ankle plantarflexion      Ankle inversion      Ankle eversion      (Blank rows = not tested)  BED MOBILITY:  Mod I     FUNCTIONAL TESTS:  30 seconds chair stand test  4 Single leg stance:  Rt: 7" , LT: 4" 2 minute walk test : 121 ft. No assistive device decreased step length B, decreased ankle and knee motion on the LT>   TODAY'S TREATMENT:                                                                                                                              DATE:  12/29/22 Progress note 2 MWT 293 ft without AD 30 sec sit to stand x 8 SLS Right 20 sec, left 20 sec MMT's see above  Supine:  Bridge with 2" hold x 10  Standing: Hip abduction 2 x 10 Hip extension 2 x 10 Heel/toe raises x 20 Tandem walking 20 ft down and back x 2 with CGA     12/25/2022 Standing: Heel raise x 15 Toe raises x 15  Functional squat x 15 Marching x 5 March with opposite arm raise x 5  Single leg stance x 10" each x 5 Lunge onto 6" step B x 10 Sit to stand x 15 Gait no assistive device x 450 ft Tandem gait x 2 RT Leg  press 4 pl x 10   12/23/2022 BP manually 158/88 right arm  Standing: Heel/toe raises on incline 2 x 10 STS x 10 from chair no UE assist Walking over short hurdles x 3 down and back and 2 with CGA Step navigation 4" and 8" over and back x 1 4" box step ups without UE assist x 10 Tandem stance on foam  beam 2 x 30" each    12/18/22: 2MWT 275f with QC Heel raise incline slope 15 Toe raises decline slope 15x 10 STS Squat 10x front of chair, cueing for mechanics Toe tapping 8in step 2sets 10x with less UE support  Opposite UE/LE toe tapping 2sets  Sidestep 4RT total, 2 RT with GTB around thigh Tandem stance 2x 30" no HHA SLS 3x intermittent HHA 30" holds Vector stance 5x5"  12/16/22: Reviewed goals Educated importance of HEP compliance STS Standing:  Minisquat (cueing for mechanics) 10x front of chair Heel/ toe raises 10x each Sidestep inside //bars 2RT March 10x alternating with HHA Seated: LAQ 10x Supine: Bridge 10x Sidelying: Abduction 10x    12/10/22: Evaluation : Sitting: LAQ x 10 Sit to stand x 10 Supine : Bridge x 10   Stand: Minisquat x 5   PATIENT EDUCATION: Education details: HEP Person educated: Patient Education method: EConsulting civil engineer Verbal cues, and Handouts Education comprehension: verbalized understanding and returned demonstration  HOME EXERCISE PROGRAM: Access Code: [email or web address]: [email or web address] Date: 12/10/2022 Prepared by: CRayetta Humphrey Exercises - Sit to Stand Without Arm Support  - 2 x daily - 7 x weekly - 1 sets - 10 reps - 2 hold - Seated Long Arc Quad  - 2 x daily - 7 x weekly - 1 sets - 10 reps - 5" hold - Mini Squat with Counter Support  - 2 x daily - 7 x weekly - 1 sets - 10 reps - 5" hold - Supine Bridge  - 2 x daily - 7 x weekly - 1 sets - 10 reps - 5" hold  12/16/22 Prepared by: CIhor AustinSidelying ab  12/18/22: sidestep front of counter   GOALS: Goals reviewed with patient? No  SHORT TERM  GOALS: Target date: 12/31/22  PT to be I in HEP to improve strength by 1/2 grade to decrease falls  Baseline: Goal status: IN PROGRESS; met all but hip extension  2.  PT to be able to single leg stance for 10 seconds for decreased fall risk in the home  Baseline:  Goal status: MET  3.  PT to be able to stand for an hour to cook a full meal  Baseline:  Goal status: MET    LONG TERM GOALS: Target date: 01/21/23  PT to be I in HEP to improve strength by 1 grade to decrease falls and be able to get up off the floor with increased ease.  Baseline:  Goal status: IN PROGRESS  2.  PT to be able to single leg stance for 20 seconds for decreased fall risk on uneven ground Baseline:  Goal status: MET  3.  PT to be ambulating in the community  Baseline:  Goal status: MET   ASSESSMENT:  CLINICAL IMPRESSION: Progress note today.  Patient with good progress with 2 MWT and 30 sec sit to stand test; made improvements with all strength testing except with hip extension. Patient is able to walk in the PT gym without AD today however still demonstrates slow pace and occasional reaching with upper extremities to touch wall or equipment to assist with balance   Patient will benefit from continued skilled therapy services to address remaining unmet and partially met goals; address deficits and promote return to optimal function.       OBJECTIVE IMPAIRMENTS: Abnormal gait, decreased activity tolerance, decreased balance, decreased endurance, decreased mobility, difficulty walking, decreased strength, impaired UE functional use, and pain.   ACTIVITY LIMITATIONS: carrying, lifting, bending, standing, squatting,  stairs, bathing, dressing, and locomotion level  PARTICIPATION LIMITATIONS: meal prep, cleaning, laundry, driving, shopping, community activity, occupation, and yard work  PERSONAL FACTORS: Fitness and 3+ comorbidities: COPD, dM, depression   are also affecting patient's functional outcome.    REHAB POTENTIAL: Good  CLINICAL DECISION MAKING: Stable/uncomplicated  EVALUATION COMPLEXITY: Moderate  PLAN:  PT FREQUENCY: 2x/week  PT DURATION: 6 weeks  PLANNED INTERVENTIONS: Therapeutic exercises, Therapeutic activity, Neuromuscular re-education, Balance training, Gait training, Patient/Family education, and Self Care  PLAN FOR NEXT SESSION: reassess   10:31 AM, 12/29/22 Hermenia Fritcher Small Briane Birden MPT [facility] [place] [phone]    Patient Details  Name: Kayla Choi MRN: [id number] Date of Birth: July 15, 1966 Referring Provider:  [facility]  Encounter Date: 12/29/2022    [place] [facility] [place], [place], [address] Phone: [phone]   Fax:  [phone]

## 2022-12-31 ENCOUNTER — Ambulatory Visit (HOSPITAL_COMMUNITY): Payer: Medicaid Other | Admitting: Physical Therapy

## 2022-12-31 DIAGNOSIS — M6281 Muscle weakness (generalized): Secondary | ICD-10-CM

## 2022-12-31 DIAGNOSIS — R200 Anesthesia of skin: Secondary | ICD-10-CM | POA: Diagnosis not present

## 2022-12-31 DIAGNOSIS — R262 Difficulty in walking, not elsewhere classified: Secondary | ICD-10-CM | POA: Diagnosis not present

## 2022-12-31 NOTE — Therapy (Signed)
OUTPATIENT PHYSICAL THERAPY TREATMENT   Patient Name: Kayla Choi MRN: [id number] DOB:1966/10/03, 57 y.o., female Today's Date: 12/31/2022  PCP: [facility] PROVIDER: Rodena Goldmann, DO  END OF SESSION:  PT End of Session - 12/31/22 0934     Visit Number 7    Number of Visits 12    Date for PT Re-Evaluation 01/21/23    Authorization Type healthy blue    Authorization Time Period 5 visits from 01/22-->02/28/23    Authorization - Visit Number 1    Authorization - Number of Visits 5    Progress Note Due on Visit 5    PT Start Time 0945    PT Stop Time 1025    PT Time Calculation (min) 40 min    Activity Tolerance Patient tolerated treatment well;Patient limited by fatigue    Behavior During Therapy [facility] for tasks assessed/performed               Past Medical History:  Diagnosis Date   Acid reflux    ADD (attention deficit disorder)    Anemia    iron deficinecy   Anxiety    with social phobia   Asthma    Balance problem    Chest pain    that occurs with anxiety   COPD (chronic obstructive pulmonary disease) (HCC)    Depression    Diabetes mellitus    Glaucoma    Hyperlipidemia    Hypertension    Normal cardiac stress test 12/2015   low risk study   Panic attack    Prediabetes    Recurrent falls    Social phobia    Past Surgical History:  Procedure Laterality Date   ABDOMINAL HYSTERECTOMY     fibroids, both ovaries left intact   ANKLE SURGERY     CHOLECYSTECTOMY     COLONOSCOPY N/A 10/18/2019   Procedure: COLONOSCOPY;  Surgeon: Daneil Dolin, MD;  Location: [facility];  Service: Endoscopy;  Laterality: N/A;  1:00-office rescheduled to 11/10 @ 12:45pm   COSMETIC SURGERY     elbow   FRACTURE SURGERY     Recurrent elbow surgery   LIPOMA EXCISION  04/30/2012   Procedure: EXCISION LIPOMA;  Surgeon: Donato Heinz, MD;  Location: [facility];  Service: General;  Laterality: Right;  Excision soft tissue mass right thigh   MASS EXCISION  Right 12/19/2020   Procedure: EXCISION CYST; 2 CM; AXILLA;  Surgeon: Virl Cagey, MD;  Location: [facility];  Service: General;  Laterality: Right;   ORIF ANKLE FRACTURE Right 12/15/2017   Procedure: OPEN REDUCTION INTERNAL FIXATION (ORIF) RIGHT ANKLE FRACTURE;  Surgeon: Renette Butters, MD;  Location: [place];  Service: Orthopedics;  Laterality: Right;   POLYPECTOMY  10/18/2019   Procedure: POLYPECTOMY;  Surgeon: Daneil Dolin, MD;  Location[facility];  Service: Endoscopy;;  colon   right elbow     reconstruction   Patient Active Problem List   Diagnosis Date Noted   Left sided numbness 12/01/2022   Class 2 obesity 09/02/2017   Recurrent falls 05/13/2016   Sebaceous cyst of right axilla 03/10/2016   Onychomycosis of toenail 02/28/2015   Knee pain 10/06/2014   Neck muscle spasm 07/02/2014   Back pain 10/23/2013   Headache 10/05/2012   Inadequate social support 06/25/2012   Microalbuminuria 06/25/2012   Diabetes mellitus, type II ([place]) 04/02/2012   Keloid scar of skin 09/03/2011   COPD (chronic obstructive pulmonary disease) ([place]) 05/21/2007  Hyperlipidemia 01/29/2007   Anxiety state 11/02/2006   MDD (major depressive disorder) 11/02/2006   Essential hypertension 11/02/2006    ONSET DATE: 12/02/2022  REFERRING DIAG: R20.0 (ICD-10-CM) - Left sided numbness  THERAPY DIAG:  Muscle weakness (generalized)  Difficulty in walking, not elsewhere classified  Rationale for Evaluation and Treatment: Rehabilitation  SUBJECTIVE:                                                                                                                                                                                             SUBJECTIVE STATEMENT: Pt comes today with quad cane that she reports she only uses at times. Pt questioning when she can start OT for her Lt hand weakness.  No pain or issues otherwise.  PERTINENT HISTORY: Kayla Choi is a 57 y.o. female with  medical history significant of acid reflux, anxiety, COPD, depression, diabetes mellitus type 2, hyperlipidemia, hypertension, and more who presented to the  ED on 12/26  with a chief complaint of left-sided numbness.  Patient was apparently driving home when it started. She reports that she had associated blurry vision, slurred speech, dysphagia. Her daughter noticed her left facial droop. Patient reports that she had weakness in her left upper extremity, and dropped her purse. She had difficulty with ambulation due to dizziness. She had numbness in her face as well. Patient reports that most of the weakness is gone, but the numbness is still present.  PAIN:  Are you having pain? No pain today.  Yes: NPRS scale: 0/10  Pain location: toe nails are hurting as well as chronic back.  Therapist explained we would not be focusing on either of these but hopefully as she gets stronger she will have less pain.  Pain description: tingling  Aggravating factors: not sure Relieving factors: tylenol   PRECAUTIONS: Fall  WEIGHT BEARING RESTRICTIONS: No  FALLS: Has patient fallen in last 6 months? Yes; 4 times she is not sure if she is losing her balance or if her Lt leg is going out l    LIVING ENVIRONMENT: Lives with: lives alone Lives in: House/apartment Stairs: to enter the house but there is a ramp  Has following equipment at home: None  PLOF: Independent  PATIENT GOALS: Pt wants to get back to normal   OBJECTIVE:   DIAGNOSTIC FINDINGS: IMPRESSION: MRI brain:   1. 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 2. Abnormal T1 hypointense marrow signal within the calvarium and visualized upper cervical spine. While this findings can reflect a marrow infiltrative process, the most common causes include chronic anemia, smoking and  obesity. 3. Bilateral proptosis.   MRA head:   1. No intracranial large vessel occlusion. 2. 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 3. Moderate focal stenosis within the left PCA P2 segment.     Electronically Signed   By: Kellie Simmering D.O.   On: 12/02/2022 10:13  COGNITION: Overall cognitive status: Within functional limits for tasks assessed   SENSATION: PT reports tingling in the left side of her body.    LOWER EXTREMITY MMT:    MMT Right Eval Left Eval Right 12/29/2022 left 12/29/2022  Hip flexion 3 2+ 4+ 4  Hip extension 3- 2+ 3- 3-  Hip abduction 5 3-  4-  Hip adduction      Hip internal rotation      Hip external rotation      Knee flexion 3- '3 4 4  '$ Knee extension '5 4 5 5  '$ Ankle dorsiflexion 5 4+ 5 5  Ankle plantarflexion      Ankle inversion      Ankle eversion      (Blank rows = not tested)  BED MOBILITY:  Mod I   FUNCTIONAL TESTS:  30 seconds chair stand test  4 Single leg stance:  Rt: 7" , LT: 4" 2 minute walk test : 121 ft. No assistive device decreased step length B, decreased ankle and knee motion on the LT>   TODAY'S TREATMENT:                                                                                                                              DATE:  12/31/22 Standing: Hip abduction 20x Hip extension 20x Heel/toe raises on incline x 20 Squat 20X no UE assist Marching/step tap 6" 20X alternating with 1 HHA Lunges 4" no UE 20X each Step ups 6" 20X 1 UE assist Tandem walking 20 ft down and back x 2 with CGA Hurdles 6"/12" step to 2RT, step overs 2RT no AD Sit to stands no UE 2X10 standard chair Leg press 4 pl 3X10 Nustep at EOS level 2 UE/LE 5 minutes  12/29/22 Progress note 2 MWT 293 ft without AD 30 sec sit to stand x 8 SLS Right 20 sec, left 20 sec MMT's see above Supine:  Bridge with 2" hold x 10 Standing: Hip abduction 2 x 10 Hip extension 2 x 10 Heel/toe raises x 20 Tandem walking 20 ft down and back x 2 with CGA   12/25/2022 Standing: Heel raise x 15 Toe raises x 15  Functional squat x 15 Marching x  5 March with opposite arm raise x 5  Single leg stance x 10" each x 5 Lunge onto 6" step B x 10 Sit to stand x 15 Gait no assistive device x 450 ft Tandem gait x 2 RT Leg press 4 pl x 10   12/23/2022 BP manually 158/88 right arm  Standing: Heel/toe raises on incline 2 x 10 STS x  10 from chair no UE assist Walking over short hurdles x 3 down and back and 2 with CGA Step navigation 4" and 8" over and back x 1 4" box step ups without UE assist x 10 Tandem stance on foam beam 2 x 30" each  12/18/22: 2MWT 259f with QC Heel raise incline slope 15 Toe raises decline slope 15x 10 STS Squat 10x front of chair, cueing for mechanics Toe tapping 8in step 2sets 10x with less UE support  Opposite UE/LE toe tapping 2sets  Sidestep 4RT total, 2 RT with GTB around thigh Tandem stance 2x 30" no HHA SLS 3x intermittent HHA 30" holds Vector stance 5x5"  12/16/22: Reviewed goals Educated importance of HEP compliance STS Standing:  Minisquat (cueing for mechanics) 10x front of chair Heel/ toe raises 10x each Sidestep inside //bars 2RT March 10x alternating with HHA Seated: LAQ 10x Supine: Bridge 10x Sidelying: Abduction 10x    12/10/22: Evaluation : Sitting: LAQ x 10 Sit to stand x 10 Supine : Bridge x 10   Stand: Minisquat x 5   PATIENT EDUCATION: Education details: HEP Person educated: Patient Education method: EConsulting civil engineer Verbal cues, and Handouts Education comprehension: verbalized understanding and returned demonstration  HOME EXERCISE PROGRAM: Access Code: [email or web address]: [email or web address] Date: 12/10/2022 Prepared by: CRayetta Humphrey Exercises - Sit to Stand Without Arm Support  - 2 x daily - 7 x weekly - 1 sets - 10 reps - 2 hold - Seated Long Arc Quad  - 2 x daily - 7 x weekly - 1 sets - 10 reps - 5" hold - Mini Squat with Counter Support  - 2 x daily - 7 x weekly - 1 sets - 10 reps - 5" hold - Supine Bridge  - 2 x daily - 7 x weekly - 1  sets - 10 reps - 5" hold  12/16/22 Prepared by: CIhor AustinSidelying ab  12/18/22: sidestep front of counter   GOALS: Goals reviewed with patient? No  SHORT TERM GOALS: Target date: 12/31/22  PT to be I in HEP to improve strength by 1/2 grade to decrease falls  Baseline: Goal status: IN PROGRESS; met all but hip extension  2.  PT to be able to single leg stance for 10 seconds for decreased fall risk in the home  Baseline:  Goal status: MET  3.  PT to be able to stand for an hour to cook a full meal  Baseline:  Goal status: MET    LONG TERM GOALS: Target date: 01/21/23  PT to be I in HEP to improve strength by 1 grade to decrease falls and be able to get up off the floor with increased ease.  Baseline:  Goal status: IN PROGRESS  2.  PT to be able to single leg stance for 20 seconds for decreased fall risk on uneven ground Baseline:  Goal status: MET  3.  PT to be ambulating in the community  Baseline:  Goal status: MET   ASSESSMENT:  CLINICAL IMPRESSION: Continued with LE strength and stability activities.  Challenged today with hurdles with ability to navigate "step over" without UE assist and no LOB.  Pt did have several episodes of circumducting LE to step over rather than using hip flexors but able to correct with cues.  Increased reps/challenge with most exercises and no need for rest breaks during session.  Sent OT request to MD per pt request.  Pt will continue to benefit from skilled therapy to progress towards goals.  OBJECTIVE IMPAIRMENTS: Abnormal gait, decreased activity tolerance, decreased balance, decreased endurance, decreased mobility, difficulty walking, decreased strength, impaired UE functional use, and pain.   ACTIVITY LIMITATIONS: carrying, lifting, bending, standing, squatting, stairs, bathing, dressing, and locomotion level  PARTICIPATION LIMITATIONS: meal prep, cleaning, laundry, driving, shopping, community activity, occupation, and yard  work  PERSONAL FACTORS: Fitness and 3+ comorbidities: COPD, dM, depression   are also affecting patient's functional outcome.   REHAB POTENTIAL: Good  CLINICAL DECISION MAKING: Stable/uncomplicated  EVALUATION COMPLEXITY: Moderate  PLAN:  PT FREQUENCY: 2x/week  PT DURATION: 6 weeks  PLANNED INTERVENTIONS: Therapeutic exercises, Therapeutic activity, Neuromuscular re-education, Balance training, Gait training, Patient/Family education, and Self Care  PLAN FOR NEXT SESSION: Continue X 4 more visits (as approved) working on improving LE strength, balance and stability.   1:08 PM, 12/31/22 Teena Irani, PTA/CLT [place] Ph: [phone]   Patient Details  Name: Kayla Choi MRN: [id number] Date of Birth: 03/25/1966 Referring Provider:  [facility]  Encounter Date: 12/31/2022    [facility] [place], [place], [address] Phone: [phone]   Fax:  [phone]

## 2023-01-05 ENCOUNTER — Ambulatory Visit (HOSPITAL_COMMUNITY): Payer: Medicaid Other | Admitting: Physical Therapy

## 2023-01-05 ENCOUNTER — Other Ambulatory Visit: Payer: Self-pay

## 2023-01-05 DIAGNOSIS — M6281 Muscle weakness (generalized): Secondary | ICD-10-CM | POA: Diagnosis not present

## 2023-01-05 DIAGNOSIS — R200 Anesthesia of skin: Secondary | ICD-10-CM | POA: Diagnosis not present

## 2023-01-05 DIAGNOSIS — R262 Difficulty in walking, not elsewhere classified: Secondary | ICD-10-CM

## 2023-01-05 NOTE — Therapy (Signed)
OUTPATIENT PHYSICAL THERAPY TREATMENT   Patient Name: Kayla Choi MRN: [id number] DOB:Dec 06, 1966, 57 y.o., female Today's Date: 01/05/2023  PCP: [facility] PROVIDER: Rodena Goldmann, DO  END OF SESSION:  PT End of Session - 01/05/23 1345    Visit Number 8    Number of Visits 12    Date for PT Re-Evaluation 01/21/23    Authorization Type healthy blue    Authorization Time Period 5 visits from 01/22-->02/28/23    Authorization - Visit Number 2    Authorization - Number of Visits 5    Progress Note Due on Visit 5    PT Start Time 4967    PT Stop Time 1345    PT Time Calculation (min) 40 min    Activity Tolerance Patient tolerated treatment well;Patient limited by fatigue    Behavior During Therapy [facility] for tasks assessed/performed               Past Medical History:  Diagnosis Date   Acid reflux    ADD (attention deficit disorder)    Anemia    iron deficinecy   Anxiety    with social phobia   Asthma    Balance problem    Chest pain    that occurs with anxiety   COPD (chronic obstructive pulmonary disease) ([place])    Depression    Diabetes mellitus    Glaucoma    Hyperlipidemia    Hypertension    Normal cardiac stress test 12/2015   low risk study   Panic attack    Prediabetes    Recurrent falls    Social phobia    Past Surgical History:  Procedure Laterality Date   ABDOMINAL HYSTERECTOMY     fibroids, both ovaries left intact   ANKLE SURGERY     CHOLECYSTECTOMY     COLONOSCOPY N/A 10/18/2019   Procedure: COLONOSCOPY;  Surgeon: Daneil Dolin, MD;  [facility];  Service: Endoscopy;  Laterality: N/A;  1:00-office rescheduled to 11/10 @ 12:45pm   COSMETIC SURGERY     elbow   FRACTURE SURGERY     Recurrent elbow surgery   LIPOMA EXCISION  04/30/2012   Procedure: EXCISION LIPOMA;  Surgeon: Donato Heinz, MD;  Location: [facility];  Service: General;  Laterality: Right;  Excision soft tissue mass right thigh   MASS EXCISION  Right 12/19/2020   Procedure: EXCISION CYST; 2 CM; AXILLA;  Surgeon: Virl Cagey, MD;  Location: [facility];  Service: General;  Laterality: Right;   ORIF ANKLE FRACTURE Right 12/15/2017   Procedure: OPEN REDUCTION INTERNAL FIXATION (ORIF) RIGHT ANKLE FRACTURE;  Surgeon: Renette Butters, MD;  Location: [place];  Service: Orthopedics;  Laterality: Right;   POLYPECTOMY  10/18/2019   Procedure: POLYPECTOMY;  Surgeon: Daneil Dolin, MD;  [facility];  Service: Endoscopy;;  colon   right elbow     reconstruction   Patient Active Problem List   Diagnosis Date Noted   Left sided numbness 12/01/2022   Class 2 obesity 09/02/2017   Recurrent falls 05/13/2016   Sebaceous cyst of right axilla 03/10/2016   Onychomycosis of toenail 02/28/2015   Knee pain 10/06/2014   Neck muscle spasm 07/02/2014   Back pain 10/23/2013   Headache 10/05/2012   Inadequate social support 06/25/2012   Microalbuminuria 06/25/2012   Diabetes mellitus, type II ([place]) 04/02/2012   Keloid scar of skin 09/03/2011   COPD (chronic obstructive pulmonary disease) ([place]) 05/21/2007  Hyperlipidemia 01/29/2007   Anxiety state 11/02/2006   MDD (major depressive disorder) 11/02/2006   Essential hypertension 11/02/2006    ONSET DATE: 12/02/2022  REFERRING DIAG: R20.0 (ICD-10-CM) - Left sided numbness  THERAPY DIAG:  Muscle weakness (generalized)  Difficulty in walking, not elsewhere classified  Rationale for Evaluation and Treatment: Rehabilitation  SUBJECTIVE:                                                                                                                                                                                             SUBJECTIVE STATEMENT: Pt states that she went to [place] this weekend and did not use her cane.  Pt comes ambulating with a cane.  States that she is mainly concerned about her arm.   PERTINENT HISTORY: Kayla Choi is a 56 y.o. female with  medical history significant of acid reflux, anxiety, COPD, depression, diabetes mellitus type 2, hyperlipidemia, hypertension, and more who presented to the  ED on 12/26  with a chief complaint of left-sided numbness.  Patient was apparently driving home when it started. She reports that she had associated blurry vision, slurred speech, dysphagia. Her daughter noticed her left facial droop. Patient reports that she had weakness in her left upper extremity, and dropped her purse. She had difficulty with ambulation due to dizziness. She had numbness in her face as well. Patient reports that most of the weakness is gone, but the numbness is still present.  PAIN:  Are you having pain? No pain today.  Yes: NPRS scale: 0/10  Pain location: toe nails are hurting as well as chronic back.  Therapist explained we would not be focusing on either of these but hopefully as she gets stronger she will have less pain.  Pain description: tingling  Aggravating factors: not sure Relieving factors: tylenol   PRECAUTIONS: Fall  WEIGHT BEARING RESTRICTIONS: No  FALLS: Has patient fallen in last 6 months? Yes; 4 times she is not sure if she is losing her balance or if her Lt leg is going out l    PATIENT GOALS: Pt wants to get back to normal   OBJECTIVE:   DIAGNOSTIC FINDINGS: IMPRESSION: MRI brain:   1. 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 2. Abnormal T1 hypointense marrow signal within the calvarium and visualized upper cervical spine. While this findings can reflect a marrow infiltrative process, the most common causes include chronic anemia, smoking and obesity. 3. Bilateral proptosis.   MRA head:   1. No intracranial large vessel occlusion. 2. 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 3.  Moderate focal stenosis within the left PCA P2 segment.     Electronically Signed   By: Kellie Simmering D.O.   On: 12/02/2022  10:13   SENSATION: PT reports tingling in the left side of her body.    LOWER EXTREMITY MMT:    MMT Right Eval Left Eval Right 12/29/2022 left 12/29/2022  Hip flexion 3 2+ 4+ 4  Hip extension 3- 2+ 3- 3-  Hip abduction 5 3-  4-  Hip adduction      Hip internal rotation      Hip external rotation      Knee flexion 3- '3 4 4  '$ Knee extension '5 4 5 5  '$ Ankle dorsiflexion 5 4+ 5 5  Ankle plantarflexion      Ankle inversion      Ankle eversion      (Blank rows = not tested)  BED MOBILITY:  Mod I   FUNCTIONAL TESTS:  30 seconds chair stand test  4 Single leg stance:  Rt: 7" , LT: 4" 2 minute walk test : 121 ft. No assistive device decreased step length B, decreased ankle and knee motion on the LT>   TODAY'S TREATMENT:                                                                                                                              DATE:  01/05/23 Standing Gt 226 without cane Heel raise x 15 Functional squat x 15  Step up LT LE 6" step x 20 Step down Lt LE 6" step x 20  Side step with green theraband x 4 RT side lying to sitting pushing off with Left hand Quadriped Ambulance person B x 10  Quadriped single leg raise x 10 Sit to stand lowest on mat x 20 Leg press 5 pl x 15     Tandem walking 20 ft down and back x 2 with CGA Hurdles 6"/12" step to 2RT, step overs 2RT no AD Sit to stands no UE 2X10 standard chair Leg press 4 pl 3X10 Nustep at EOS level 2 UE/LE 5 minutes  12/29/22 Progress note 2 MWT 293 ft without AD 30 sec sit to stand x 8 SLS Right 20 sec, left 20 sec MMT's see above Supine:  Bridge with 2" hold x 10 Standing: Hip abduction 2 x 10 Hip extension 2 x 10 Heel/toe raises x 20 Tandem walking 20 ft down and back x 2 with CGA   12/25/2022 Standing: Heel raise x 15 Toe raises x 15  Functional squat x 15 Marching x 5 March with opposite arm raise x 5  Single leg stance x 10" each x 5 Lunge onto 6" step B x 10 Sit to stand x 15 Gait no  assistive device x 450 ft Tandem gait x 2 RT Leg press 4 pl x 10   12/23/2022 BP manually 158/88 right arm  Standing: Heel/toe raises on incline 2 x 10 STS x  10 from chair no UE assist Walking over short hurdles x 3 down and back and 2 with CGA Step navigation 4" and 8" over and back x 1 4" box step ups without UE assist x 10 Tandem stance on foam beam 2 x 30" each  12/18/22: 2MWT 236f with QC Heel raise incline slope 15 Toe raises decline slope 15x 10 STS Squat 10x front of chair, cueing for mechanics Toe tapping 8in step 2sets 10x with less UE support  Opposite UE/LE toe tapping 2sets  Sidestep 4RT total, 2 RT with GTB around thigh Tandem stance 2x 30" no HHA SLS 3x intermittent HHA 30" holds Vector stance 5x5"  12/16/22: Reviewed goals Educated importance of HEP compliance STS Standing:  Minisquat (cueing for mechanics) 10x front of chair Heel/ toe raises 10x each Sidestep inside //bars 2RT March 10x alternating with HHA Seated: LAQ 10x Supine: Bridge 10x Sidelying: Abduction 10x    12/10/22: Evaluation : Sitting: LAQ x 10 Sit to stand x 10 Supine : Bridge x 10   Stand: Minisquat x 5   PATIENT EDUCATION: Education details: HEP Person educated: Patient Education method: EConsulting civil engineer Verbal cues, and Handouts Education comprehension: verbalized understanding and returned demonstration  HOME EXERCISE PROGRAM: Access Code: [email or web address]: [email or web address] Date: 12/10/2022 Prepared by: CRayetta Humphrey Exercises - Sit to Stand Without Arm Support  - 2 x daily - 7 x weekly - 1 sets - 10 reps - 2 hold - Seated Long Arc Quad  - 2 x daily - 7 x weekly - 1 sets - 10 reps - 5" hold - Mini Squat with Counter Support  - 2 x daily - 7 x weekly - 1 sets - 10 reps - 5" hold - Supine Bridge  - 2 x daily - 7 x weekly - 1 sets - 10 reps - 5" hold  12/16/22 Prepared by: CIhor AustinSidelying ab  12/18/22: sidestep front of  counter   GOALS: Goals reviewed with patient? No  SHORT TERM GOALS: Target date: 12/31/22  PT to be I in HEP to improve strength by 1/2 grade to decrease falls  Baseline: Goal status: IN PROGRESS; met all but hip extension  2.  PT to be able to single leg stance for 10 seconds for decreased fall risk in the home  Baseline:  Goal status: MET  3.  PT to be able to stand for an hour to cook a full meal  Baseline:  Goal status: MET    LONG TERM GOALS: Target date: 01/21/23  PT to be I in HEP to improve strength by 1 grade to decrease falls and be able to get up off the floor with increased ease.  Baseline:  Goal status: IN PROGRESS  2.  PT to be able to single leg stance for 20 seconds for decreased fall risk on uneven ground Baseline:  Goal status: MET  3.  PT to be ambulating in the community  Baseline:  Goal status: MET   ASSESSMENT:  CLINICAL IMPRESSION: Continued with LE strength and stability activities.  Added quadriped exercises to promote WB thru UE as well as hip strengthening. .  Increased reps/challenge with most exercises and no need for rest breaks during session.  Sent OT request to MD per pt request.  Pt will continue to benefit from skilled therapy to progress towards goals.    OBJECTIVE IMPAIRMENTS: Abnormal gait, decreased activity tolerance, decreased balance, decreased endurance, decreased mobility, difficulty walking, decreased strength, impaired UE functional use, and pain.  ACTIVITY LIMITATIONS: carrying, lifting, bending, standing, squatting, stairs, bathing, dressing, and locomotion level  PARTICIPATION LIMITATIONS: meal prep, cleaning, laundry, driving, shopping, community activity, occupation, and yard work  PERSONAL FACTORS: Fitness and 3+ comorbidities: COPD, dM, depression   are also affecting patient's functional outcome.   REHAB POTENTIAL: Good  CLINICAL DECISION MAKING: Stable/uncomplicated  EVALUATION COMPLEXITY:  Moderate  PLAN:  PT FREQUENCY: 2x/week  PT DURATION: 6 weeks  PLANNED INTERVENTIONS: Therapeutic exercises, Therapeutic activity, Neuromuscular re-education, Balance training, Gait training, Patient/Family education, and Self Care  PLAN FOR NEXT SESSION: Continue X 3 more visits (as approved) working on improving LE strength, balance and stability.  Rayetta Humphrey, Reader [phone]  Patient Details  Name: Kayla Choi MRN: [id number] Date of Birth: 05-07-1966 Referring Provider:  [facility]  Encounter Date: 01/05/2023

## 2023-01-07 ENCOUNTER — Ambulatory Visit (HOSPITAL_COMMUNITY): Payer: Medicaid Other | Admitting: Physical Therapy

## 2023-01-07 DIAGNOSIS — R262 Difficulty in walking, not elsewhere classified: Secondary | ICD-10-CM | POA: Diagnosis not present

## 2023-01-07 DIAGNOSIS — M6281 Muscle weakness (generalized): Secondary | ICD-10-CM

## 2023-01-07 DIAGNOSIS — R200 Anesthesia of skin: Secondary | ICD-10-CM | POA: Diagnosis not present

## 2023-01-07 NOTE — Therapy (Signed)
OUTPATIENT PHYSICAL THERAPY TREATMENT   Patient Name: Kayla Choi MRN: [id number] DOB:1966-07-07, 57 y.o., female Today's Date: 01/07/2023  PCP: [facility] PROVIDER: Rodena Goldmann, DO  END OF SESSION:  PT End of Session - 01/07/23 1342     Visit Number 8    Number of Visits 12    Date for PT Re-Evaluation 01/21/23    Authorization Type healthy blue    Authorization Time Period 5 visits from 01/22-->02/28/23    Authorization - Visit Number 4   Authorization - Number of Visits 5    Progress Note Due on Visit 5    PT Start Time 2458    PT Stop Time 1342    PT Time Calculation (min) 38 min    Activity Tolerance Patient tolerated treatment well;Patient limited by fatigue    Behavior During Therapy [facility] for tasks assessed/performed                   Past Medical History:  Diagnosis Date   Acid reflux    ADD (attention deficit disorder)    Anemia    iron deficinecy   Anxiety    with social phobia   Asthma    Balance problem    Chest pain    that occurs with anxiety   COPD (chronic obstructive pulmonary disease) ([place])    Depression    Diabetes mellitus    Glaucoma    Hyperlipidemia    Hypertension    Normal cardiac stress test 12/2015   low risk study   Panic attack    Prediabetes    Recurrent falls    Social phobia    Past Surgical History:  Procedure Laterality Date   ABDOMINAL HYSTERECTOMY     fibroids, both ovaries left intact   ANKLE SURGERY     CHOLECYSTECTOMY     COLONOSCOPY N/A 10/18/2019   Procedure: COLONOSCOPY;  Surgeon: Daneil Dolin, MD;  [facility];  Service: Endoscopy;  Laterality: N/A;  1:00-office rescheduled to 11/10 @ 12:45pm   COSMETIC SURGERY     elbow   FRACTURE SURGERY     Recurrent elbow surgery   LIPOMA EXCISION  04/30/2012   Procedure: EXCISION LIPOMA;  Surgeon: Donato Heinz, MD;  Location: [facility];  Service: General;  Laterality: Right;  Excision soft tissue mass right thigh   MASS  EXCISION Right 12/19/2020   Procedure: EXCISION CYST; 2 CM; AXILLA;  Surgeon: Virl Cagey, MD;  Location: [facility];  Service: General;  Laterality: Right;   ORIF ANKLE FRACTURE Right 12/15/2017   Procedure: OPEN REDUCTION INTERNAL FIXATION (ORIF) RIGHT ANKLE FRACTURE;  Surgeon: Renette Butters, MD;  Location: [place];  Service: Orthopedics;  Laterality: Right;   POLYPECTOMY  10/18/2019   Procedure: POLYPECTOMY;  Surgeon: Daneil Dolin, MD;  [facility];  Service: Endoscopy;;  colon   right elbow     reconstruction   Patient Active Problem List   Diagnosis Date Noted   Left sided numbness 12/01/2022   Class 2 obesity 09/02/2017   Recurrent falls 05/13/2016   Sebaceous cyst of right axilla 03/10/2016   Onychomycosis of toenail 02/28/2015   Knee pain 10/06/2014   Neck muscle spasm 07/02/2014   Back pain 10/23/2013   Headache 10/05/2012   Inadequate social support 06/25/2012   Microalbuminuria 06/25/2012   Diabetes mellitus, type II ([place]) 04/02/2012   Keloid scar of skin 09/03/2011   COPD (chronic obstructive pulmonary disease) (  [place]) 05/21/2007   Hyperlipidemia 01/29/2007   Anxiety state 11/02/2006   MDD (major depressive disorder) 11/02/2006   Essential hypertension 11/02/2006    ONSET DATE: 12/02/2022  REFERRING DIAG: R20.0 (ICD-10-CM) - Left sided numbness  THERAPY DIAG:  Muscle weakness (generalized)  Difficulty in walking, not elsewhere classified  Rationale for Evaluation and Treatment: Rehabilitation  SUBJECTIVE:                                                                                                                                                                                             SUBJECTIVE STATEMENT:PT continues to be concerned about her arm, also states she is slurring her words and sometimes has difficulty swallowing..  Therapist explained that the hospitalist who referred her to PT will not refer to OT as she has  been discharged from the hospital .  Therapist gave pt a referral sheet for speech and OT, instructed pt that she needs to go to the [facility] clinic and have someone sign for her to receive services.  PT  walking without an assistive device now  and cooked dinner yesterday.   PERTINENT HISTORY: Kayla Choi is a 57 y.o. female with medical history significant of acid reflux, anxiety, COPD, depression, diabetes mellitus type 2, hyperlipidemia, hypertension, and more who presented to the  ED on 12/26  with a chief complaint of left-sided numbness.  Patient was apparently driving home when it started. She reports that she had associated blurry vision, slurred speech, dysphagia. Her daughter noticed her left facial droop. Patient reports that she had weakness in her left upper extremity, and dropped her purse. She had difficulty with ambulation due to dizziness. She had numbness in her face as well. Patient reports that most of the weakness is gone, but the numbness is still present.  PAIN:  Are you having pain? No pain today.  Yes: NPRS scale: 0/10  Pain location: toe nails are hurting as well as chronic back.  Therapist explained we would not be focusing on either of these but hopefully as she gets stronger she will have less pain.  Pain description: tingling  Aggravating factors: not sure Relieving factors: tylenol   PRECAUTIONS: Fall  WEIGHT BEARING RESTRICTIONS: No  FALLS: Has patient fallen in last 6 months? Yes; 4 times she is not sure if she is losing her balance or if her Lt leg is going out l    PATIENT GOALS: Pt wants to get back to normal   OBJECTIVE:   DIAGNOSTIC FINDINGS: IMPRESSION: MRI brain:   1. 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  2. Abnormal T1 hypointense marrow signal within the calvarium and visualized upper cervical spine. While this findings can reflect a marrow infiltrative process, the most common causes include  chronic anemia, smoking and obesity. 3. Bilateral proptosis.   MRA head:   1. No intracranial large vessel occlusion. 2. 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 3. Moderate focal stenosis within the left PCA P2 segment.     Electronically Signed   By: Kellie Simmering D.O.   On: 12/02/2022 10:13   SENSATION: PT reports tingling in the left side of her body.    LOWER EXTREMITY MMT:    MMT Right Eval Left Eval Right 12/29/2022 left 12/29/2022  Hip flexion 3 2+ 4+ 4  Hip extension 3- 2+ 3- 3-  Hip abduction 5 3-  4-  Hip adduction      Hip internal rotation      Hip external rotation      Knee flexion 3- '3 4 4  '$ Knee extension '5 4 5 5  '$ Ankle dorsiflexion 5 4+ 5 5  Ankle plantarflexion      Ankle inversion      Ankle eversion      (Blank rows = not tested)  BED MOBILITY:  Mod I   FUNCTIONAL TESTS:  30 seconds chair stand test  4 01/07/2023:  10  Single leg stance:  Rt: 7" , LT: 4" 2 minute walk test : 121 ft. No assistive device decreased step length B, decreased ankle and knee motion on the LT>   TODAY'S TREATMENT:                                                                                                                              DATE:  01/07/2023  Quariped: Single leg raise x 15 Knee flexion with hip in neutral x 15 Opposite arm/leg raise 5 x 2 sets Fire hydrant x 15  Single leg stance x 5 on each leg  Tandem gait over 6 and 12" hurdles x 4 RT; Nustep hills 3 level 3 x 7:00' Sit to stand x 10  01/05/23 Standing Gt 226 without cane Heel raise x 15 Functional squat x 15  Step up LT LE 6" step x 20 Step down Lt LE 6" step x 20  Side step with green theraband x 4 RT side lying to sitting pushing off with Left hand Quadriped Ambulance person B x 10  Quadriped single leg raise x 10 Sit to stand lowest on mat x 20 Leg press 5 pl x 15     Tandem walking 20 ft down and back x 2 with CGA Hurdles 6"/12" step to 2RT, step  overs 2RT no AD Sit to stands no UE 2X10 standard chair Leg press 4 pl 3X10 Nustep at EOS level 2 UE/LE 5 minutes  12/29/22 Progress note 2 MWT 293 ft without AD 30 sec sit to stand x 8 SLS Right 20 sec,  left 20 sec MMT's see above Supine:  Bridge with 2" hold x 10 Standing: Hip abduction 2 x 10 Hip extension 2 x 10 Heel/toe raises x 20 Tandem walking 20 ft down and back x 2 with CGA   12/25/2022 Standing: Heel raise x 15 Toe raises x 15  Functional squat x 15 Marching x 5 March with opposite arm raise x 5  Single leg stance x 10" each x 5 Lunge onto 6" step B x 10 Sit to stand x 15 Gait no assistive device x 450 ft Tandem gait x 2 RT Leg press 4 pl x 10    PATIENT EDUCATION: Education details: HEP Person educated: Patient Education method: Explanation, Verbal cues, and Handouts Education comprehension: verbalized understanding and returned demonstration  HOME EXERCISE PROGRAM: Access Code: [email or web address] URL: [email or web address] Date: 12/10/2022 Prepared by: Rayetta Humphrey  Exercises - Sit to Stand Without Arm Support  - 2 x daily - 7 x weekly - 1 sets - 10 reps - 2 hold - Seated Long Arc Quad  - 2 x daily - 7 x weekly - 1 sets - 10 reps - 5" hold - Mini Squat with Counter Support  - 2 x daily - 7 x weekly - 1 sets - 10 reps - 5" hold - Supine Bridge  - 2 x daily - 7 x weekly - 1 sets - 10 reps - 5" hold  12/16/22 Prepared by: Ihor Austin Sidelying ab  12/18/22: sidestep front of counter   GOALS: Goals reviewed with patient? No  SHORT TERM GOALS: Target date: 12/31/22  PT to be I in HEP to improve strength by 1/2 grade to decrease falls  Baseline: Goal status: MET; met all but hip extension  2.  PT to be able to single leg stance for 10 seconds for decreased fall risk in the home  Baseline:  Goal status: MET  3.  PT to be able to stand for an hour to cook a full meal  Baseline:  Goal status: MET    LONG TERM GOALS: Target date:  01/21/23  PT to be I in HEP to improve strength by 1 grade to decrease falls and be able to get up off the floor with increased ease.  Baseline:  Goal status: MET   2.  PT to be able to single leg stance for 20 seconds for decreased fall risk on uneven ground Baseline:  Goal status: MET on the right 15" on left   3.  PT to be ambulating in the community  Baseline:  Goal status: MET   ASSESSMENT:  CLINICAL IMPRESSION: Continued with LE strength and stability activities.  Added quadriped exercises to promote WB thru UE as well as hip strengthening. .  Increased reps/challenge with most exercises and no need for rest breaks during session.  Sent OT request to MD per pt request.  Pt will continue to benefit from skilled therapy to progress towards goals.    OBJECTIVE IMPAIRMENTS: Abnormal gait, decreased activity tolerance, decreased balance, decreased endurance, decreased mobility, difficulty walking, decreased strength, impaired UE functional use, and pain.   ACTIVITY LIMITATIONS: carrying, lifting, bending, standing, squatting, stairs, bathing, dressing, and locomotion level  PARTICIPATION LIMITATIONS: meal prep, cleaning, laundry, driving, shopping, community activity, occupation, and yard work  PERSONAL FACTORS: Fitness and 3+ comorbidities: COPD, dM, depression   are also affecting patient's functional outcome.   REHAB POTENTIAL: Good  CLINICAL DECISION MAKING: Stable/uncomplicated  EVALUATION COMPLEXITY: Moderate  PLAN:  PT FREQUENCY: 2x/week  PT  DURATION: 6 weeks  PLANNED INTERVENTIONS: Therapeutic exercises, Therapeutic activity, Neuromuscular re-education, Balance training, Gait training, Patient/Family education, and Self Care  PLAN FOR NEXT SESSION: reassess work on new HEP and discharge.  Rayetta Humphrey, [place] [phone]  Patient Details  Name: Hassie Mandt MRN: [id number] Date of Birth: 06-03-1966 Referring Provider:  [facility]  Encounter  Date: 01/07/2023 1346

## 2023-01-13 ENCOUNTER — Encounter (HOSPITAL_COMMUNITY): Payer: Self-pay | Admitting: Occupational Therapy

## 2023-01-13 ENCOUNTER — Ambulatory Visit (HOSPITAL_COMMUNITY): Payer: Medicaid Other | Attending: Internal Medicine | Admitting: Occupational Therapy

## 2023-01-13 ENCOUNTER — Other Ambulatory Visit: Payer: Self-pay

## 2023-01-13 DIAGNOSIS — R278 Other lack of coordination: Secondary | ICD-10-CM

## 2023-01-13 DIAGNOSIS — R262 Difficulty in walking, not elsewhere classified: Secondary | ICD-10-CM | POA: Diagnosis not present

## 2023-01-13 DIAGNOSIS — R29818 Other symptoms and signs involving the nervous system: Secondary | ICD-10-CM

## 2023-01-13 DIAGNOSIS — M6281 Muscle weakness (generalized): Secondary | ICD-10-CM | POA: Diagnosis not present

## 2023-01-13 NOTE — Therapy (Addendum)
OUTPATIENT OCCUPATIONAL THERAPY NEURO EVALUATION  Patient Name: Kayla Choi MRN: [id number] DOB:1966-01-16, 57 y.o., female Today's Date: 01/13/2023  PCP: [facility] PROVIDER: Yves Dill, NP   END OF SESSION:   01/13/23 1332  OT Visits / Re-Eval  Visit Number 1  Number of Visits 8  Date for OT Re-Evaluation 02/12/23  Authorization  Authorization Type [place]  Authorization Time Period 27 visits combined PT/OT/ST. Is also receiving PT. Requesting 8 visits  Authorization - Visit Number 0  Authorization - Number of Visits 8  OT Time Calculation  OT Start Time 1300  OT Stop Time 1340  OT Time Calculation (min) 40 min     Past Medical History:  Diagnosis Date   Acid reflux    ADD (attention deficit disorder)    Anemia    iron deficinecy   Anxiety    with social phobia   Asthma    Balance problem    Chest pain    that occurs with anxiety   COPD (chronic obstructive pulmonary disease) (HCC)    Depression    Diabetes mellitus    Glaucoma    Hyperlipidemia    Hypertension    Normal cardiac stress test 12/2015   low risk study   Panic attack    Prediabetes    Recurrent falls    Social phobia    Past Surgical History:  Procedure Laterality Date   ABDOMINAL HYSTERECTOMY     fibroids, both ovaries left intact   ANKLE SURGERY     CHOLECYSTECTOMY     COLONOSCOPY N/A 10/18/2019   Procedure: COLONOSCOPY;  Surgeon: Daneil Dolin, MD;  [facility];  Service: Endoscopy;  Laterality: N/A;  1:00-office rescheduled to 11/10 @ 12:45pm   COSMETIC SURGERY     elbow   FRACTURE SURGERY     Recurrent elbow surgery   LIPOMA EXCISION  04/30/2012   Procedure: EXCISION LIPOMA;  Surgeon: Donato Heinz, MD;  Location: [facility];  Service: General;  Laterality: Right;  Excision soft tissue mass right thigh   MASS EXCISION Right 12/19/2020   Procedure: EXCISION CYST; 2 CM; AXILLA;  Surgeon: Virl Cagey, MD;  Location: [facility];   Service: General;  Laterality: Right;   ORIF ANKLE FRACTURE Right 12/15/2017   Procedure: OPEN REDUCTION INTERNAL FIXATION (ORIF) RIGHT ANKLE FRACTURE;  Surgeon: Renette Butters, MD;  Location: [facility];  Service: Orthopedics;  Laterality: Right;   POLYPECTOMY  10/18/2019   Procedure: POLYPECTOMY;  Surgeon: Daneil Dolin, MD;  [facility];  Service: Endoscopy;;  colon   right elbow     reconstruction   Patient Active Problem List   Diagnosis Date Noted   Left sided numbness 12/01/2022   Class 2 obesity 09/02/2017   Recurrent falls 05/13/2016   Sebaceous cyst of right axilla 03/10/2016   Onychomycosis of toenail 02/28/2015   Knee pain 10/06/2014   Neck muscle spasm 07/02/2014   Back pain 10/23/2013   Headache 10/05/2012   Inadequate social support 06/25/2012   Microalbuminuria 06/25/2012   Diabetes mellitus, type II ([place]) 04/02/2012   Keloid scar of skin 09/03/2011   COPD (chronic obstructive pulmonary disease) ([place]) 05/21/2007   Hyperlipidemia 01/29/2007   Anxiety state 11/02/2006   MDD (major depressive disorder) 11/02/2006   Essential hypertension 11/02/2006    ONSET DATE: 12/01/22  REFERRING DIAG: s/p CVA  THERAPY DIAG:  Other symptoms and signs involving the nervous system  Other lack of  coordination  Rationale for Evaluation and Treatment: Rehabilitation  SUBJECTIVE:   SUBJECTIVE STATEMENT: S: It all started on Christmas day. 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 Pt with symptoms of left sided numbness and weakness, vision deficits. Pt with continued weakness and motor planning/coordination deficits in the LUE.  PRECAUTIONS: None  WEIGHT BEARING RESTRICTIONS: No  PAIN:  Are you having pain? Yes: NPRS scale: 3/10 Pain location: left fingers Pain description: dull Aggravating factors: trying to open items Relieving factors: not moving it.  FALLS: Has patient fallen in  last 6 months? Yes. Number of falls 1  LIVING ENVIRONMENT: Lives with: lives alone Lives in: House/apartment Stairs: No Has following equipment at home: Single point cane and shower chair  PLOF: Independent  PATIENT GOALS: To get the left hand to do what I want it to do.   OBJECTIVE:   HAND DOMINANCE: Right  ADLs: Overall ADLs: Pt reports difficulty with holding her phone, opening jars and bottle lids, taking a bath, cooking, tying things.    FUNCTIONAL OUTCOME MEASURES: Quick Dash: 93.18  UPPER EXTREMITY ROM:      A/ROM is WNL throughout LUE  UPPER EXTREMITY MMT:     MMT Left eval  Shoulder flexion 4/5  Shoulder abduction 4/5  Shoulder internal rotation 4+/5  Shoulder external rotation 4/5  Elbow flexion 5/5  Elbow extension 4/5  Wrist flexion 4/5  Wrist extension 4/5  Wrist ulnar deviation 3+/5  Wrist radial deviation 4-/5  Wrist pronation 4-/5  Wrist supination 4-/5  (Blank rows = not tested)  HAND FUNCTION: Grip strength: Right: 56 lbs; Left: 30 lbs, Lateral pinch: Right: 15 lbs, Left: 4 lbs, and 3 point pinch: Right: 11 lbs, Left: 3 lbs  COORDINATION: 9 Hole Peg test: Right: 29.42" sec; Left: 1'08" sec  SENSATION: Pt reports tingling and numbness throughout LUE from shoulder to hand  EDEMA: mild edema in left hand  COGNITION: Overall cognitive status: Within functional limits for tasks assessed  VISION: Subjective report: Sometimes runs into things Baseline vision: Wears glasses all the time  VISION ASSESSMENT: WFL  Patient has difficulty with following activities due to following visual impairments: Pt testing negative for visual field cut or hemianopsia   TODAY'S TREATMENT:                                                                                                                              DATE: N/A-eval only    PATIENT EDUCATION: Education details: Shoulder strengthening, red theraputty tasks Person educated: Patient Education  method: Explanation, Demonstration, and Handouts Education comprehension: verbalized understanding and returned demonstration  HOME EXERCISE PROGRAM: Eval: shoulder strengthening, red theraputty grip/pinch strengthening   GOALS: Goals reviewed with patient? Yes  SHORT TERM GOALS: Target date: 02/10/23  Pt will be provided with and educated on HEP to improve LUE use as non-dominant during ADLs.  Goal status: INITIAL  2.  Pt will increase LUE strength to 4+/5 to improve ability to perform self-care tasks such as bathing, dressing, and grooming independently.   Goal status: INITIAL  3.  Pt will increase her left grip strength by 12# and pinch strength by 4# to improve ability to grip and open bottles, jars, and containers.   Goal status: INITIAL  4.  Pt will improve left hand coordination required for dressing tasks, housekeeping tasks, by completing 9 hole peg test in under 45."  Goal status: INITIAL  5.  Pt will decrease pain the LUE to 3/10 or less to improve ability to use during functional tasks without compensatory strategies.   Goal status: INITIAL   ASSESSMENT:  CLINICAL IMPRESSION: Patient is a 57 y.o. female who was seen today for occupational therapy evaluation s/p right CVA. Pt presents with LUE weakness and coordination deficits limiting her ability to complete ADLs and functional tasks while incorporating the LUE. Pt also with some pain in the left shoulder with certain movements and during MMT. Pt will benefit from skilled OT services to improve functional use of LUE during daily tasks.    PERFORMANCE DEFICITS: in functional skills including ADLs, IADLs, coordination, strength, pain, Fine motor control, vision, and UE functional use  IMPAIRMENTS: are limiting patient from ADLs, IADLs, rest and sleep, and leisure.   CO-MORBIDITIES: has no other co-morbidities that affects occupational performance. Patient will benefit from skilled OT to address above impairments and  improve overall function.  MODIFICATION OR ASSISTANCE TO COMPLETE EVALUATION: No modification of tasks or assist necessary to complete an evaluation.  OT OCCUPATIONAL PROFILE AND HISTORY: Problem focused assessment: Including review of records relating to presenting problem.  CLINICAL DECISION MAKING: LOW - limited treatment options, no task modification necessary  REHAB POTENTIAL: Good  EVALUATION COMPLEXITY: Low    PLAN:  OT FREQUENCY: 2x/week  OT DURATION: 4 weeks  PLANNED INTERVENTIONS: self care/ADL training, therapeutic exercise, therapeutic activity, neuromuscular re-education, patient/family education, DME and/or AE instructions, and Re-evaluation  RECOMMENDED OTHER SERVICES: Speech therapy evaluation  CONSULTED AND AGREED WITH PLAN OF CARE: Patient  PLAN FOR NEXT SESSION: Follow up on HEP, begin LUE strengthening, grip and pinch strengthening, coordination tasks   Guadelupe Sabin, OTR/L  [phone] 01/13/2023, 1:33 PM

## 2023-01-13 NOTE — Patient Instructions (Signed)
Repeat all exercises 10-15 times, 1-2 times per day.  1) Shoulder Protraction    Begin with elbows by your side, slowly "punch" straight out in front of you.      2) Shoulder Flexion  Standing:         Begin with arms at your side with thumbs pointed up, slowly raise both arms up and forward towards overhead.               3) Horizontal abduction/adduction  Standing:           Begin with arms straight out in front of you, bring out to the side in at "T" shape. Keep arms straight entire time.                 4) Internal & External Rotation  Standing:     Stand with elbows at the side and elbows bent 90 degrees. Move your forearms away from your body, then bring back inward toward the body.     5) Shoulder Abduction  Standing:       Lying on your back begin with your arms flat on the table next to your side. Slowly move your arms out to the side so that they go overhead, in a jumping jack or snow angel movement.       Home Exercises Program Theraputty Exercises  Do the following exercises 2 times a day using your affected hand.  1. Roll putty into a ball.  2. Make into a pancake.  3. Roll putty into a roll.  4. Pinch along log with first finger and thumb.   5. Make into a ball.  6. Roll it back into a log.   7. Pinch using thumb and side of first finger.  8. Roll into a ball, then flatten into a pancake.  9. Using your fingers, make putty into a mountain.  10. Roll putty back into a ball and squeeze gently for 2-3 minutes.

## 2023-01-14 ENCOUNTER — Ambulatory Visit (HOSPITAL_COMMUNITY): Payer: Medicaid Other

## 2023-01-14 DIAGNOSIS — R29818 Other symptoms and signs involving the nervous system: Secondary | ICD-10-CM

## 2023-01-14 DIAGNOSIS — R262 Difficulty in walking, not elsewhere classified: Secondary | ICD-10-CM

## 2023-01-14 DIAGNOSIS — M6281 Muscle weakness (generalized): Secondary | ICD-10-CM

## 2023-01-14 DIAGNOSIS — R278 Other lack of coordination: Secondary | ICD-10-CM | POA: Diagnosis not present

## 2023-01-14 NOTE — Therapy (Signed)
OUTPATIENT PHYSICAL THERAPY TREATMENT   Patient Name: Kayla Choi MRN: [id number] DOB:07-13-1966, 57 y.o., female Today's Date: 01/14/2023  PCP: [facility] PROVIDER: Rodena Goldmann, DO  END OF SESSION:   PT End of Session - 01/14/23 1042     Visit Number 9    Number of Visits 12    Date for PT Re-Evaluation 01/21/23    Authorization Type healthy [employer]    Authorization Time Period 5 visits from 01/22-->02/28/23    Authorization - Visit Number 4    Authorization - Number of Visits 5    Progress Note Due on Visit 5    PT Start Time 7209    PT Stop Time 1115    PT Time Calculation (min) 35 min    Activity Tolerance Patient limited by fatigue               Past Medical History:  Diagnosis Date   Acid reflux    ADD (attention deficit disorder)    Anemia    iron deficinecy   Anxiety    with social phobia   Asthma    Balance problem    Chest pain    that occurs with anxiety   COPD (chronic obstructive pulmonary disease) ([place])    Depression    Diabetes mellitus    Glaucoma    Hyperlipidemia    Hypertension    Normal cardiac stress test 12/2015   low risk study   Panic attack    Prediabetes    Recurrent falls    Social phobia    Past Surgical History:  Procedure Laterality Date   ABDOMINAL HYSTERECTOMY     fibroids, both ovaries left intact   ANKLE SURGERY     CHOLECYSTECTOMY     COLONOSCOPY N/A 10/18/2019   Procedure: COLONOSCOPY;  Surgeon: Daneil Dolin, MD;  [facility];  Service: Endoscopy;  Laterality: N/A;  1:00-office rescheduled to 11/10 @ 12:45pm   COSMETIC SURGERY     elbow   FRACTURE SURGERY     Recurrent elbow surgery   LIPOMA EXCISION  04/30/2012   Procedure: EXCISION LIPOMA;  Surgeon: Donato Heinz, MD;  Location[facility];  Service: General;  Laterality: Right;  Excision soft tissue mass right thigh   MASS EXCISION Right 12/19/2020   Procedure: EXCISION CYST; 2 CM; AXILLA;  Surgeon: Virl Cagey, MD;   Location[facility];  Service: General;  Laterality: Right;   ORIF ANKLE FRACTURE Right 12/15/2017   Procedure: OPEN REDUCTION INTERNAL FIXATION (ORIF) RIGHT ANKLE FRACTURE;  Surgeon: Renette Butters, MD;  Location: [facility];  Service: Orthopedics;  Laterality: Right;   POLYPECTOMY  10/18/2019   Procedure: POLYPECTOMY;  Surgeon: Daneil Dolin, MD;  [facility];  Service: Endoscopy;;  colon   right elbow     reconstruction   Patient Active Problem List   Diagnosis Date Noted   Left sided numbness 12/01/2022   Class 2 obesity 09/02/2017   Recurrent falls 05/13/2016   Sebaceous cyst of right axilla 03/10/2016   Onychomycosis of toenail 02/28/2015   Knee pain 10/06/2014   Neck muscle spasm 07/02/2014   Back pain 10/23/2013   Headache 10/05/2012   Inadequate social support 06/25/2012   Microalbuminuria 06/25/2012   Diabetes mellitus, type II ([place]) 04/02/2012   Keloid scar of skin 09/03/2011   COPD (chronic obstructive pulmonary disease) ([place]) 05/21/2007   Hyperlipidemia 01/29/2007   Anxiety state 11/02/2006   MDD (major  depressive disorder) 11/02/2006   Essential hypertension 11/02/2006    ONSET DATE: 12/02/2022  REFERRING DIAG: R20.0 (ICD-10-CM) - Left sided numbness  THERAPY DIAG:  No diagnosis found.  Rationale for Evaluation and Treatment: Rehabilitation  SUBJECTIVE:                                                                                                                                                                                             SUBJECTIVE STATEMENT:Patient is 10 minutes late today. Patient fell yesterday as she was trying to move fast. Patient denies being in pain as a result of incident. Patient denies pain at the moment.   PERTINENT HISTORY: Kayla Choi is a 57 y.o. female with medical history significant of acid reflux, anxiety, COPD, depression, diabetes mellitus type 2, hyperlipidemia, hypertension, and more who  presented to the  ED on 12/26  with a chief complaint of left-sided numbness.  Patient was apparently driving home when it started. She reports that she had associated blurry vision, slurred speech, dysphagia. Her daughter noticed her left facial droop. Patient reports that she had weakness in her left upper extremity, and dropped her purse. She had difficulty with ambulation due to dizziness. She had numbness in her face as well. Patient reports that most of the weakness is gone, but the numbness is still present.  PAIN:  Are you having pain? No pain today.  Yes: NPRS scale: 0/10  Pain location: toe nails are hurting as well as chronic back.  Therapist explained we would not be focusing on either of these but hopefully as she gets stronger she will have less pain.  Pain description: tingling  Aggravating factors: not sure Relieving factors: tylenol   PRECAUTIONS: Fall  WEIGHT BEARING RESTRICTIONS: No  FALLS: Has patient fallen in last 6 months? Yes; 4 times she is not sure if she is losing her balance or if her Lt leg is going out l    PATIENT GOALS: Pt wants to get back to normal   OBJECTIVE:   DIAGNOSTIC FINDINGS: IMPRESSION: MRI brain:   1. 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 2. Abnormal T1 hypointense marrow signal within the calvarium and visualized upper cervical spine. While this findings can reflect a marrow infiltrative process, the most common causes include chronic anemia, smoking and obesity. 3. Bilateral proptosis.   MRA head:   1. No intracranial large vessel occlusion. 2. 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 3. Moderate focal stenosis within the left PCA P2 segment.     Electronically Signed   By: Marylyn Ishihara  Armandina Gemma D.O.   On: 12/02/2022 10:13   SENSATION: PT reports tingling in the left side of her body.    LOWER EXTREMITY MMT:    MMT Right Eval Left Eval Right 12/29/2022  left 12/29/2022  Hip flexion 3 2+ 4+ 4  Hip extension 3- 2+ 3- 3-  Hip abduction 5 3-  4-  Hip adduction      Hip internal rotation      Hip external rotation      Knee flexion 3- '3 4 4  '$ Knee extension '5 4 5 5  '$ Ankle dorsiflexion 5 4+ 5 5  Ankle plantarflexion      Ankle inversion      Ankle eversion      (Blank rows = not tested)  BED MOBILITY:  Mod I   FUNCTIONAL TESTS:  30 seconds chair stand test  4 01/07/2023:  10  Single leg stance:  Rt: 7" , LT: 4" 2 minute walk test : 121 ft. No assistive device decreased step length B, decreased ankle and knee motion on the LT>   TODAY'S TREATMENT:                                                                                                                              DATE:  01/14/23 Gastrocnemius slant board stretch x 30" x 3 L LAQ x 3" x 10 x 2 x 3 lb // bars:  Forward step ups, 8" box, 10 x 2, L LE leading, no HHA  Heel raises x 3 lbs x 10 x 2  High Marches x 3 lb x 3" x 10 x 2  Hip ext x 10 x 2 x 3 lb Walking backwards x 10 ft x 3 rounds, CGA Walking sideways x 10 ft x 3 rounds, CGA Forward stepping over cones x 10 ft x 3 rounds, CGA  01/07/2023  Quariped: Single leg raise x 15 Knee flexion with hip in neutral x 15 Opposite arm/leg raise 5 x 2 sets Fire hydrant x 15  Single leg stance x 5 on each leg  Tandem gait over 6 and 12" hurdles x 4 RT; Nustep hills 3 level 3 x 7:00' Sit to stand x 10  01/05/23 Standing Gt 226 without cane Heel raise x 15 Functional squat x 15  Step up LT LE 6" step x 20 Step down Lt LE 6" step x 20  Side step with green theraband x 4 RT side lying to sitting pushing off with Left hand Quadriped Ambulance person B x 10  Quadriped single leg raise x 10 Sit to stand lowest on mat x 20 Leg press 5 pl x 15     Tandem walking 20 ft down and back x 2 with CGA Hurdles 6"/12" step to 2RT, step overs 2RT no AD Sit to stands no UE 2X10 standard chair Leg press 4 pl 3X10 Nustep at EOS level 2  UE/LE 5 minutes  12/29/22 Progress note  2 MWT 293 ft without AD 30 sec sit to stand x 8 SLS Right 20 sec, left 20 sec MMT's see above Supine:  Bridge with 2" hold x 10 Standing: Hip abduction 2 x 10 Hip extension 2 x 10 Heel/toe raises x 20 Tandem walking 20 ft down and back x 2 with CGA   12/25/2022 Standing: Heel raise x 15 Toe raises x 15  Functional squat x 15 Marching x 5 March with opposite arm raise x 5  Single leg stance x 10" each x 5 Lunge onto 6" step B x 10 Sit to stand x 15 Gait no assistive device x 450 ft Tandem gait x 2 RT Leg press 4 pl x 10    PATIENT EDUCATION: Education details: HEP Person educated: Patient Education method: Explanation, Verbal cues, and Handouts Education comprehension: verbalized understanding and returned demonstration  HOME EXERCISE PROGRAM: Access Code: [email or web address] URL: [email or web address] Date: 12/10/2022 Prepared by: Rayetta Humphrey  Exercises - Sit to Stand Without Arm Support  - 2 x daily - 7 x weekly - 1 sets - 10 reps - 2 hold - Seated Long Arc Quad  - 2 x daily - 7 x weekly - 1 sets - 10 reps - 5" hold - Mini Squat with Counter Support  - 2 x daily - 7 x weekly - 1 sets - 10 reps - 5" hold - Supine Bridge  - 2 x daily - 7 x weekly - 1 sets - 10 reps - 5" hold  12/16/22 Prepared by: Ihor Austin Sidelying ab  12/18/22: sidestep front of counter   GOALS: Goals reviewed with patient? No  SHORT TERM GOALS: Target date: 12/31/22  PT to be I in HEP to improve strength by 1/2 grade to decrease falls  Baseline: Goal status: MET; met all but hip extension  2.  PT to be able to single leg stance for 10 seconds for decreased fall risk in the home  Baseline:  Goal status: MET  3.  PT to be able to stand for an hour to cook a full meal  Baseline:  Goal status: MET    LONG TERM GOALS: Target date: 01/21/23  PT to be I in HEP to improve strength by 1 grade to decrease falls and be able to get up  off the floor with increased ease.  Baseline:  Goal status: MET   2.  PT to be able to single leg stance for 20 seconds for decreased fall risk on uneven ground Baseline:  Goal status: MET on the right 15" on left   3.  PT to be ambulating in the community  Baseline:  Goal status: MET   ASSESSMENT:  CLINICAL IMPRESSION: Tolerated all activities without worsening of symptoms. Demonstrated appropriate levels of fatigue. Rest periods provided Required mild amount of cueing to ensure correct execution of activity. Especially during step-ups. Cues were also provided during backward stepping due to weakness of the hip ext. Slight circumduction on the R LE and mild unsteadiness noted when doing forward stepping due to weakness of hip abd on the L. To date, skilled PT is required to address the impairments and improve function.   OBJECTIVE IMPAIRMENTS: Abnormal gait, decreased activity tolerance, decreased balance, decreased endurance, decreased mobility, difficulty walking, decreased strength, impaired UE functional use, and pain.   ACTIVITY LIMITATIONS: carrying, lifting, bending, standing, squatting, stairs, bathing, dressing, and locomotion level  PARTICIPATION LIMITATIONS: meal prep, cleaning, laundry, driving, shopping, community activity, occupation, and yard work  PERSONAL FACTORS:  Fitness and 3+ comorbidities: COPD, dM, depression   are also affecting patient's functional outcome.   REHAB POTENTIAL: Good  CLINICAL DECISION MAKING: Stable/uncomplicated  EVALUATION COMPLEXITY: Moderate  PLAN:  PT FREQUENCY: 2x/week  PT DURATION: 6 weeks  PLANNED INTERVENTIONS: Therapeutic exercises, Therapeutic activity, Neuromuscular re-education, Balance training, Gait training, Patient/Family education, and Self Care  PLAN FOR NEXT SESSION: reassess work on new HEP and discharge.  Harvie Heck. Azaryah Heathcock, PT, DPT, OCS Board-Certified Clinical Specialist in [place] #  ([place]): [phone] T 11:23

## 2023-01-15 DIAGNOSIS — I10 Essential (primary) hypertension: Secondary | ICD-10-CM | POA: Diagnosis not present

## 2023-01-15 DIAGNOSIS — E1142 Type 2 diabetes mellitus with diabetic polyneuropathy: Secondary | ICD-10-CM | POA: Diagnosis not present

## 2023-01-15 DIAGNOSIS — I69354 Hemiplegia and hemiparesis following cerebral infarction affecting left non-dominant side: Secondary | ICD-10-CM | POA: Diagnosis not present

## 2023-01-16 ENCOUNTER — Ambulatory Visit (HOSPITAL_COMMUNITY): Payer: Medicaid Other | Admitting: Physical Therapy

## 2023-01-16 DIAGNOSIS — M6281 Muscle weakness (generalized): Secondary | ICD-10-CM | POA: Diagnosis not present

## 2023-01-16 DIAGNOSIS — R262 Difficulty in walking, not elsewhere classified: Secondary | ICD-10-CM | POA: Diagnosis not present

## 2023-01-16 DIAGNOSIS — R278 Other lack of coordination: Secondary | ICD-10-CM

## 2023-01-16 DIAGNOSIS — R29818 Other symptoms and signs involving the nervous system: Secondary | ICD-10-CM | POA: Diagnosis not present

## 2023-01-16 NOTE — Therapy (Signed)
OUTPATIENT PHYSICAL THERAPY TREATMENT   Patient Name: Kayla Choi MRN: [id number] DOB:[birth year], 57 y.o., female Today's Date: 01/16/2023 PHYSICAL THERAPY DISCHARGE SUMMARY  Visits from Start of Care: 10  Current functional level related to goals / functional outcomes: All goals met   Remaining deficits: Weak glut max    Education / Equipment: HEP   Patient agrees to discharge. Patient goals were met. Patient is being discharged due to meeting the stated rehab goals.  PCP: [facility] REFERRING PROVIDER: Rodena Goldmann, DO  END OF SESSION:   PT End of Session - 01/16/23 1153    Visit Number 10    Number of Visits 12    Date for PT Re-Evaluation 01/21/23    Authorization Type healthy blue    Authorization Time Period 5 visits from 01/22-->02/28/23    Authorization - Visit Number 5    Authorization - Number of Visits 5    Progress Note Due on Visit 5    PT Start Time [id number]    PT Stop Time 1153   PT Time Calculation (min) 30 min    Activity Tolerance Patient limited by fatigue    Behavior During Therapy [facility] for tasks assessed/performed               Past Medical History:  Diagnosis Date   Acid reflux    ADD (attention deficit disorder)    Anemia    iron deficinecy   Anxiety    with social phobia   Asthma    Balance problem    Chest pain    that occurs with anxiety   COPD (chronic obstructive pulmonary disease) (HCC)    Depression    Diabetes mellitus    Glaucoma    Hyperlipidemia    Hypertension    Normal cardiac stress test 12/2015   low risk study   Panic attack    Prediabetes    Recurrent falls    Social phobia    Past Surgical History:  Procedure Laterality Date   ABDOMINAL HYSTERECTOMY     fibroids, both ovaries left intact   ANKLE SURGERY     CHOLECYSTECTOMY     COLONOSCOPY N/A 10/18/2019   Procedure: COLONOSCOPY;  Surgeon: Daneil Dolin, MD;  [facility];  Service: Endoscopy;  Laterality: N/A;  1:00-office  rescheduled to 11/10 @ 12:45pm   COSMETIC SURGERY     elbow   FRACTURE SURGERY     Recurrent elbow surgery   LIPOMA EXCISION  04/30/2012   Procedure: EXCISION LIPOMA;  Surgeon: Donato Heinz, MD;  Location: [facility];  Service: General;  Laterality: Right;  Excision soft tissue mass right thigh   MASS EXCISION Right 12/19/2020   Procedure: EXCISION CYST; 2 CM; AXILLA;  Surgeon: Virl Cagey, MD;  Location: [facility];  Service: General;  Laterality: Right;   ORIF ANKLE FRACTURE Right 12/15/2017   Procedure: OPEN REDUCTION INTERNAL FIXATION (ORIF) RIGHT ANKLE FRACTURE;  Surgeon: Renette Butters, MD;  Location: [place];  Service: Orthopedics;  Laterality: Right;   POLYPECTOMY  10/18/2019   Procedure: POLYPECTOMY;  Surgeon: Daneil Dolin, MD;  [facility];  Service: Endoscopy;;  colon   right elbow     reconstruction   Patient Active Problem List   Diagnosis Date Noted   Left sided numbness 12/01/2022   Class 2 obesity 09/02/2017   Recurrent falls 05/13/2016   Sebaceous cyst of right axilla 03/10/2016   Onychomycosis of toenail 02/28/2015  Knee pain 10/06/2014   Neck muscle spasm 07/02/2014   Back pain 10/23/2013   Headache 10/05/2012   Inadequate social support 06/25/2012   Microalbuminuria 06/25/2012   Diabetes mellitus, type II ([place]) 04/02/2012   Keloid scar of skin 09/03/2011   COPD (chronic obstructive pulmonary disease) ([place]) 05/21/2007   Hyperlipidemia 01/29/2007   Anxiety state 11/02/2006   MDD (major depressive disorder) 11/02/2006   Essential hypertension 11/02/2006    ONSET DATE: 12/02/2022  REFERRING DIAG: R20.0 (ICD-10-CM) - Left sided numbness  THERAPY DIAG:  Other symptoms and signs involving the nervous system  Other lack of coordination  Difficulty in walking, not elsewhere classified  Muscle weakness (generalized)  Rationale for Evaluation and Treatment: Rehabilitation  SUBJECTIVE:                                                                                                                                                                                              SUBJECTIVE STATEMENT: Pt states that her balance and walking is coming along.  She feels that she is 30% better.  She is walking at home and has started OT.   PERTINENT HISTORY: Kayla Choi is a 57 y.o. female with medical history significant of acid reflux, anxiety, COPD, depression, diabetes mellitus type 2, hyperlipidemia, hypertension, and more who presented to the  ED on 12/26  with a chief complaint of left-sided numbness.  Patient was apparently driving home when it started. She reports that she had associated blurry vision, slurred speech, dysphagia. Her daughter noticed her left facial droop. Patient reports that she had weakness in her left upper extremity, and dropped her purse. She had difficulty with ambulation due to dizziness. She had numbness in her face as well. Patient reports that most of the weakness is gone, but the numbness is still present.  PAIN:  Are you having pain? No pain today.  Yes: NPRS scale: 0/10  Pain location: toe nails are hurting as well as chronic back.  Therapist explained we would not be focusing on either of these but hopefully as she gets stronger she will have less pain.  Pain description: tingling  Aggravating factors: not sure Relieving factors: tylenol   PRECAUTIONS: Fall  WEIGHT BEARING RESTRICTIONS: No  FALLS: Has patient fallen in last 6 months? Yes; 4 times she is not sure if she is losing her balance or if her Lt leg is going out l    PATIENT GOALS: Pt wants to get back to normal   OBJECTIVE:   DIAGNOSTIC FINDINGS: IMPRESSION: MRI brain:   1. 1.9 x 1.4 x 2.9 cm acute infarct within the right  corona radiata/basal ganglia. 2. Abnormal T1 hypointense marrow signal within the calvarium and visualized upper cervical spine. While this findings can reflect a marrow infiltrative process, the  most common causes include chronic anemia, smoking and obesity. 3. Bilateral proptosis.   MRA head:   1. No intracranial large vessel occlusion. 2. 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 3. Moderate focal stenosis within the left PCA P2 segment.     Electronically Signed   By: Kellie Simmering D.O.   On: 12/02/2022 10:13   SENSATION: PT reports tingling in the left side of her body.    LOWER EXTREMITY MMT:    MMT Right Eval Left Eval Right 12/29/2022 Right 01/16/2023 left 12/29/2022 Left  2/9/245  Hip flexion 3 2+ 4+ 5 4 4+  Hip extension 3- 2+ 3- 4- 3- 3  Hip abduction 5 3-  5 4- 4+  Hip adduction        Hip internal rotation        Hip external rotation        Knee flexion 3- 3 4 5 4 5  $ Knee extension 5 4 5 5 5 5  $ Ankle dorsiflexion 5 4+ 5 5 5 5  $ Ankle plantarflexion        Ankle inversion        Ankle eversion        (Blank rows = not tested)  BED MOBILITY:  Mod I   FUNCTIONAL TESTS:  30 seconds chair stand test: evaluation  4; :   01/07/2023: 10,  01/16/2023 13 Single leg stance: eval:   Rt: 7" , LT: 4";  01/16/2023 Rt: 48"  , Lt: 37"  2 minute walk test : eval:  121 ft. No assistive device decreased step length B, decreased ankle and knee motion on the LT>                       01/16/2023: 436 ft no assistive device improved gait pattern TODAY'S TREATMENT:                                                                                                                              DATE:  01/16/2023 Prone: hip extension x 15 B Sit: sit to stand x 15  Quadriped hip extension x 10 B  01/14/23 Gastrocnemius slant board stretch x 30" x 3 L LAQ x 3" x 10 x 2 x 3 lb // bars:  Forward step ups, 8" box, 10 x 2, L LE leading, no HHA  Heel raises x 3 lbs x 10 x 2  High Marches x 3 lb x 3" x 10 x 2  Hip ext x 10 x 2 x 3 lb Walking backwards x 10 ft x 3 rounds, CGA Walking sideways x 10 ft x 3 rounds, CGA Forward stepping over cones x 10 ft x  3 rounds, CGA  01/07/2023  Quariped: Single leg  raise x 15 Knee flexion with hip in neutral x 15 Opposite arm/leg raise 5 x 2 sets Fire hydrant x 15  Single leg stance x 5 on each leg  Tandem gait over 6 and 12" hurdles x 4 RT; Nustep hills 3 level 3 x 7:00' Sit to stand x 10  01/05/23 Standing Gt 226 without cane Heel raise x 15 Functional squat x 15  Step up LT LE 6" step x 20 Step down Lt LE 6" step x 20  Side step with green theraband x 4 RT side lying to sitting pushing off with Left hand Quadriped Ambulance person B x 10  Quadriped single leg raise x 10 Sit to stand lowest on mat x 20 Leg press 5 pl x 15     Tandem walking 20 ft down and back x 2 with CGA Hurdles 6"/12" step to 2RT, step overs 2RT no AD Sit to stands no UE 2X10 standard chair Leg press 4 pl 3X10 Nustep at EOS level 2 UE/LE 5 minutes  12/29/22 Progress note 2 MWT 293 ft without AD 30 sec sit to stand x 8 SLS Right 20 sec, left 20 sec MMT's see above Supine:  Bridge with 2" hold x 10 Standing: Hip abduction 2 x 10 Hip extension 2 x 10 Heel/toe raises x 20 Tandem walking 20 ft down and back x 2 with CGA   12/25/2022 Standing: Heel raise x 15 Toe raises x 15  Functional squat x 15 Marching x 5 March with opposite arm raise x 5  Single leg stance x 10" each x 5 Lunge onto 6" step B x 10 Sit to stand x 15 Gait no assistive device x 450 ft Tandem gait x 2 RT Leg press 4 pl x 10    PATIENT EDUCATION: Education details: HEP Person educated: Patient Education method: Explanation, Verbal cues, and Handouts Education comprehension: verbalized understanding and returned demonstration  HOME EXERCISE PROGRAM: Access Code: [email or web address] URL: [email or web address] Date: 12/10/2022 Prepared by: Rayetta Humphrey  Exercises - Sit to Stand Without Arm Support  - 2 x daily - 7 x weekly - 1 sets - 10 reps - 2 hold - Seated Long Arc Quad  - 2 x daily - 7 x weekly - 1 sets - 10 reps - 5"  hold - Mini Squat with Counter Support  - 2 x daily - 7 x weekly - 1 sets - 10 reps - 5" hold - Supine Bridge  - 2 x daily - 7 x weekly - 1 sets - 10 reps - 5" hold  12/16/22 Prepared by: Ihor Austin Sidelying ab  12/18/22: sidestep front of counter   GOALS: Goals reviewed with patient? No  SHORT TERM GOALS: Target date: 12/31/22  PT to be I in HEP to improve strength by 1/2 grade to decrease falls  Baseline: Goal status: MET; met all but hip extension  2.  PT to be able to single leg stance for 10 seconds for decreased fall risk in the home  Baseline:  Goal status: MET  3.  PT to be able to stand for an hour to cook a full meal  Baseline:  Goal status: MET    LONG TERM GOALS: Target date: 01/21/23  PT to be I in HEP to improve strength by 1 grade to decrease falls and be able to get up off the floor with increased ease.  Baseline:  Goal status: MET   2.  PT to be able to single leg stance  for 20 seconds for decreased fall risk on uneven ground Baseline:  Goal status: MET   3.  PT to be ambulating in the community  Baseline:  Goal status: MET   ASSESSMENT:  CLINICAL IMPRESSION: Pt reassessed all goals have been met.  Therapist decreased HEP to 4 exercises to focus on gluteal maximus strength.  Therapist encouraged pt to continues to increase the time that she walks.   OBJECTIVE IMPAIRMENTS: Abnormal gait, decreased activity tolerance, decreased balance, decreased endurance, decreased mobility, difficulty walking, decreased strength, impaired UE functional use, and pain.   ACTIVITY LIMITATIONS: carrying, lifting, bending, standing, squatting, stairs, bathing, dressing, and locomotion level  PARTICIPATION LIMITATIONS: meal prep, cleaning, laundry, driving, shopping, community activity, occupation, and yard work  PERSONAL FACTORS: Fitness and 3+ comorbidities: COPD, dM, depression   are also affecting patient's functional outcome.   REHAB POTENTIAL:  Good  CLINICAL DECISION MAKING: Stable/uncomplicated  EVALUATION COMPLEXITY: Moderate  PLAN:  PT FREQUENCY: 2x/week  PT DURATION: 6 weeks  PLANNED INTERVENTIONS: Therapeutic exercises, Therapeutic activity, Neuromuscular re-education, Balance training, Gait training, Patient/Family education, and Self Care  PLAN FOR NEXT SESSION:  discharge.  Rayetta Humphrey, [place] CLT [phone]

## 2023-01-20 ENCOUNTER — Ambulatory Visit (HOSPITAL_COMMUNITY): Payer: Medicaid Other | Admitting: Occupational Therapy

## 2023-01-20 ENCOUNTER — Encounter (HOSPITAL_COMMUNITY): Payer: Self-pay | Admitting: Occupational Therapy

## 2023-01-20 ENCOUNTER — Ambulatory Visit (HOSPITAL_COMMUNITY): Payer: Medicaid Other | Admitting: Physical Therapy

## 2023-01-20 DIAGNOSIS — M6281 Muscle weakness (generalized): Secondary | ICD-10-CM | POA: Diagnosis not present

## 2023-01-20 DIAGNOSIS — R29818 Other symptoms and signs involving the nervous system: Secondary | ICD-10-CM

## 2023-01-20 DIAGNOSIS — R278 Other lack of coordination: Secondary | ICD-10-CM | POA: Diagnosis not present

## 2023-01-20 DIAGNOSIS — R262 Difficulty in walking, not elsewhere classified: Secondary | ICD-10-CM | POA: Diagnosis not present

## 2023-01-20 NOTE — Therapy (Signed)
OUTPATIENT OCCUPATIONAL THERAPY NEURO EVALUATION  Patient Name: Kayla Choi MRN: [id number] DOB:November 26, 1966, 57 y.o., female Today's Date: 01/20/2023  PCP: [facility] PROVIDER: Yves Dill, NP   END OF SESSION:  OT End of Session - 01/20/23 1512     Visit Number 2    Number of Visits 8    Date for OT Re-Evaluation 02/12/23    Authorization Type [place] Medicaid    Authorization Time Period 27 visits combined PT/OT/ST. Is also receiving PT. 5 visits approved 2/6-03/13/23    Authorization - Visit Number 1    Authorization - Number of Visits 5    OT Start Time [id number]    OT Stop Time [id number]    OT Time Calculation (min) 39 min              Past Medical History:  Diagnosis Date   Acid reflux    ADD (attention deficit disorder)    Anemia    iron deficinecy   Anxiety    with social phobia   Asthma    Balance problem    Chest pain    that occurs with anxiety   COPD (chronic obstructive pulmonary disease) (HCC)    Depression    Diabetes mellitus    Glaucoma    Hyperlipidemia    Hypertension    Normal cardiac stress test 12/2015   low risk study   Panic attack    Prediabetes    Recurrent falls    Social phobia    Past Surgical History:  Procedure Laterality Date   ABDOMINAL HYSTERECTOMY     fibroids, both ovaries left intact   ANKLE SURGERY     CHOLECYSTECTOMY     COLONOSCOPY N/A 10/18/2019   Procedure: COLONOSCOPY;  Surgeon: Daneil Dolin, MD;  [facility];  Service: Endoscopy;  Laterality: N/A;  1:00-office rescheduled to 11/10 @ 12:45pm   COSMETIC SURGERY     elbow   FRACTURE SURGERY     Recurrent elbow surgery   LIPOMA EXCISION  04/30/2012   Procedure: EXCISION LIPOMA;  Surgeon: Donato Heinz, MD;  Location: [facility];  Service: General;  Laterality: Right;  Excision soft tissue mass right thigh   MASS EXCISION Right 12/19/2020   Procedure: EXCISION CYST; 2 CM; AXILLA;  Surgeon: Virl Cagey, MD;  Location: [facility];   Service: General;  Laterality: Right;   ORIF ANKLE FRACTURE Right 12/15/2017   Procedure: OPEN REDUCTION INTERNAL FIXATION (ORIF) RIGHT ANKLE FRACTURE;  Surgeon: Renette Butters, MD;  Location: [place];  Service: Orthopedics;  Laterality: Right;   POLYPECTOMY  10/18/2019   Procedure: POLYPECTOMY;  Surgeon: Daneil Dolin, MD;  [facility];  Service: Endoscopy;;  colon   right elbow     reconstruction   Patient Active Problem List   Diagnosis Date Noted   Left sided numbness 12/01/2022   Class 2 obesity 09/02/2017   Recurrent falls 05/13/2016   Sebaceous cyst of right axilla 03/10/2016   Onychomycosis of toenail 02/28/2015   Knee pain 10/06/2014   Neck muscle spasm 07/02/2014   Back pain 10/23/2013   Headache 10/05/2012   Inadequate social support 06/25/2012   Microalbuminuria 06/25/2012   Diabetes mellitus, type II ([place]) 04/02/2012   Keloid scar of skin 09/03/2011   COPD (chronic obstructive pulmonary disease) ([place]) 05/21/2007   Hyperlipidemia 01/29/2007   Anxiety state 11/02/2006   MDD (major depressive disorder) 11/02/2006   Essential hypertension 11/02/2006  ONSET DATE: 12/01/22  REFERRING DIAG: s/p CVA  THERAPY DIAG:  Other symptoms and signs involving the nervous system  Other lack of coordination  Rationale for Evaluation and Treatment: Rehabilitation  SUBJECTIVE:   SUBJECTIVE STATEMENT: S: The numbness is trying to wear off.   PERTINENT HISTORY: Pt is a 57 y/o female s/p right CVA on 12/01/22. Pt with symptoms of left sided numbness and weakness, vision deficits. Pt with continued weakness and motor planning/coordination deficits in the LUE.  PRECAUTIONS: None  WEIGHT BEARING RESTRICTIONS: No  PAIN:  Are you having pain? No  FALLS: Has patient fallen in last 6 months? Yes. Number of falls 1  PATIENT GOALS: To get the left hand to do what I want it to do.   OBJECTIVE:   HAND DOMINANCE: Right  ADLs: Overall ADLs:  Pt reports difficulty with holding her phone, opening jars and bottle lids, taking a bath, cooking, tying things.    FUNCTIONAL OUTCOME MEASURES: Quick Dash: 93.18  UPPER EXTREMITY ROM:      A/ROM is WNL throughout LUE  UPPER EXTREMITY MMT:     MMT Left eval  Shoulder flexion 4/5  Shoulder abduction 4/5  Shoulder internal rotation 4+/5  Shoulder external rotation 4/5  Elbow flexion 5/5  Elbow extension 4/5  Wrist flexion 4/5  Wrist extension 4/5  Wrist ulnar deviation 3+/5  Wrist radial deviation 4-/5  Wrist pronation 4-/5  Wrist supination 4-/5  (Blank rows = not tested)  HAND FUNCTION: Grip strength: Right: 56 lbs; Left: 30 lbs, Lateral pinch: Right: 15 lbs, Left: 4 lbs, and 3 point pinch: Right: 11 lbs, Left: 3 lbs  COORDINATION: 9 Hole Peg test: Right: 29.42" sec; Left: 1'08" sec  SENSATION: Pt reports tingling and numbness throughout LUE from shoulder to hand  EDEMA: mild edema in left hand   TODAY'S TREATMENT:                                                                                                                              DATE:  01/20/23: -shoulder strengthening: seated, 2#, protraction, flexion, horizontal abduction, er/IR, abduction, 10 reps -Proximal shoulder strengthening: seated-paddles, criss cross, circles each direction, 10 reps each -Scapular strengthening: row, extension, retraction, green theraband, 10 reps -Overhead lacing: seated, lacing from top down then removing -Grip strengthening: all large beads hand gripper at 25#, 7 medium beads at 25# -Coin manipulation: pt holding coins in left hand, working on in hand translation to fingertips, then placing in slotted container. 3 trials, min difficulty with task, increased time for success.  PATIENT EDUCATION: Education details: reviewed HEP Person educated: Patient Education method: Explanation, Demonstration, and Handouts Education comprehension: verbalized understanding and returned  demonstration  HOME EXERCISE PROGRAM: Eval: shoulder strengthening, red theraputty grip/pinch strengthening   GOALS: Goals reviewed with patient? Yes  SHORT TERM GOALS: Target date: 02/10/23  Pt will be provided with and educated on HEP to improve LUE use as non-dominant during ADLs.  Goal status: IN PROGRESS  2.  Pt will increase LUE strength to 4+/5 to improve ability to perform self-care tasks such as bathing, dressing, and grooming independently.   Goal status: IN PROGRESS  3.  Pt will increase her left grip strength by 12# and pinch strength by 4# to improve ability to grip and open bottles, jars, and containers.   Goal status: IN PROGRESS  4.  Pt will improve left hand coordination required for dressing tasks, housekeeping tasks, by completing 9 hole peg test in under 45."  Goal status: IN PROGRESS  5.  Pt will decrease pain the LUE to 3/10 or less to improve ability to use during functional tasks without compensatory strategies.   Goal status: IN PROGRESS   ASSESSMENT:  CLINICAL IMPRESSION: Pt reports she has been completing her HEP daily with 1# weights and feels good. Continued with strengthening this session, increasing to 2# with verbal cuing for form and motor planning during the task. Pt completing proximal shoulder strengthening, overhead lacing, and scapular strengthening. Mod fatigue noted with increased time required for overhead lacing. Began grip strengthening, increased time for success required, stopped mid-way through medium beads due to hand pain from sustained grip. Pt completing coordination task with increased time, cuing not to use her right hand to assist.   PERFORMANCE DEFICITS: in functional skills including ADLs, IADLs, coordination, strength, pain, Fine motor control, vision, and UE functional use    PLAN:  OT FREQUENCY: 2x/week  OT DURATION: 4 weeks  PLANNED INTERVENTIONS: self care/ADL training, therapeutic exercise, therapeutic activity,  neuromuscular re-education, patient/family education, DME and/or AE instructions, and Re-evaluation  RECOMMENDED OTHER SERVICES: Speech therapy evaluation  CONSULTED AND AGREED WITH PLAN OF CARE: Patient  PLAN FOR NEXT SESSION: continue LUE strengthening trialing therapy ball strengthening, grip and pinch strengthening, coordination tasks; update HEP   Guadelupe Sabin, OTR/L  [phone] 01/20/2023, 3:15 PM

## 2023-01-23 ENCOUNTER — Ambulatory Visit (HOSPITAL_COMMUNITY): Payer: Medicaid Other

## 2023-01-27 ENCOUNTER — Ambulatory Visit (HOSPITAL_COMMUNITY): Payer: Medicaid Other | Admitting: Physical Therapy

## 2023-01-27 ENCOUNTER — Ambulatory Visit (HOSPITAL_COMMUNITY): Payer: Medicaid Other | Admitting: Occupational Therapy

## 2023-01-27 ENCOUNTER — Encounter (HOSPITAL_COMMUNITY): Payer: Self-pay | Admitting: Occupational Therapy

## 2023-01-27 DIAGNOSIS — R29818 Other symptoms and signs involving the nervous system: Secondary | ICD-10-CM | POA: Diagnosis not present

## 2023-01-27 DIAGNOSIS — R278 Other lack of coordination: Secondary | ICD-10-CM

## 2023-01-27 DIAGNOSIS — M6281 Muscle weakness (generalized): Secondary | ICD-10-CM | POA: Diagnosis not present

## 2023-01-27 DIAGNOSIS — R262 Difficulty in walking, not elsewhere classified: Secondary | ICD-10-CM | POA: Diagnosis not present

## 2023-01-27 NOTE — Therapy (Signed)
OUTPATIENT OCCUPATIONAL THERAPY NEURO TREATMENT NOTE  Patient Name: Melea Cloer MRN: [id number] DOB:06/06/1966, 57 y.o., female Today's Date: 01/27/2023  PCP: [facility] PROVIDER: Yves Dill, NP   END OF SESSION:  OT End of Session - 01/27/23 0949     Visit Number 3    Number of Visits 8    Date for OT Re-Evaluation 02/12/23    Authorization Type HB Medicaid    Authorization Time Period 27 visits combined PT/OT/ST. Is also receiving PT. 5 visits approved 2/6-03/13/23    Authorization - Visit Number 2    Authorization - Number of Visits 5    OT Start Time 0950    OT Stop Time 1030    OT Time Calculation (min) 40 min    Activity Tolerance Patient tolerated treatment well    Behavior During Therapy WFL for tasks assessed/performed              Past Medical History:  Diagnosis Date   Acid reflux    ADD (attention deficit disorder)    Anemia    iron deficinecy   Anxiety    with social phobia   Asthma    Balance problem    Chest pain    that occurs with anxiety   COPD (chronic obstructive pulmonary disease) (HCC)    Depression    Diabetes mellitus    Glaucoma    Hyperlipidemia    Hypertension    Normal cardiac stress test 12/2015   low risk study   Panic attack    Prediabetes    Recurrent falls    Social phobia    Past Surgical History:  Procedure Laterality Date   ABDOMINAL HYSTERECTOMY     fibroids, both ovaries left intact   ANKLE SURGERY     CHOLECYSTECTOMY     COLONOSCOPY N/A 10/18/2019   Procedure: COLONOSCOPY;  Surgeon: Daneil Dolin, MD;  [facility];  Service: Endoscopy;  Laterality: N/A;  1:00-office rescheduled to 11/10 @ 12:45pm   COSMETIC SURGERY     elbow   FRACTURE SURGERY     Recurrent elbow surgery   LIPOMA EXCISION  04/30/2012   Procedure: EXCISION LIPOMA;  Surgeon: Donato Heinz, MD;  Location: [facility];  Service: General;  Laterality: Right;  Excision soft tissue mass right thigh   MASS  EXCISION Right 12/19/2020   Procedure: EXCISION CYST; 2 CM; AXILLA;  Surgeon: Virl Cagey, MD;  Location: [facility];  Service: General;  Laterality: Right;   ORIF ANKLE FRACTURE Right 12/15/2017   Procedure: OPEN REDUCTION INTERNAL FIXATION (ORIF) RIGHT ANKLE FRACTURE;  Surgeon: Renette Butters, MD;  Location: [place];  Service: Orthopedics;  Laterality: Right;   POLYPECTOMY  10/18/2019   Procedure: POLYPECTOMY;  Surgeon: Daneil Dolin, MD;  [facility];  Service: Endoscopy;;  colon   right elbow     reconstruction   Patient Active Problem List   Diagnosis Date Noted   Left sided numbness 12/01/2022   Class 2 obesity 09/02/2017   Recurrent falls 05/13/2016   Sebaceous cyst of right axilla 03/10/2016   Onychomycosis of toenail 02/28/2015   Knee pain 10/06/2014   Neck muscle spasm 07/02/2014   Back pain 10/23/2013   Headache 10/05/2012   Inadequate social support 06/25/2012   Microalbuminuria 06/25/2012   Diabetes mellitus, type II ([place]) 04/02/2012   Keloid scar of skin 09/03/2011   COPD (chronic obstructive pulmonary disease) ([place]) 05/21/2007   Hyperlipidemia 01/29/2007  Anxiety state 11/02/2006   MDD (major depressive disorder) 11/02/2006   Essential hypertension 11/02/2006    ONSET DATE: 12/01/22  REFERRING DIAG: s/p CVA  THERAPY DIAG:  Other symptoms and signs involving the nervous system  Other lack of coordination  Rationale for Evaluation and Treatment: Rehabilitation  SUBJECTIVE:   SUBJECTIVE STATEMENT: S: I just can't move my arm right  PERTINENT HISTORY: Pt is a 57 y/o female s/p right CVA on 12/01/22. Pt with symptoms of left sided numbness and weakness, vision deficits. Pt with continued weakness and motor planning/coordination deficits in the LUE.  PRECAUTIONS: None  WEIGHT BEARING RESTRICTIONS: No  PAIN:  Are you having pain? No  FALLS: Has patient fallen in last 6 months? Yes. Number of falls 1  PATIENT GOALS:  To get the left hand to do what I want it to do.   OBJECTIVE:   HAND DOMINANCE: Right  ADLs: Overall ADLs: Pt reports difficulty with holding her phone, opening jars and bottle lids, taking a bath, cooking, tying things.    FUNCTIONAL OUTCOME MEASURES: Quick Dash: 93.18  UPPER EXTREMITY ROM:      A/ROM is WNL throughout LUE  UPPER EXTREMITY MMT:     MMT Left eval  Shoulder flexion 4/5  Shoulder abduction 4/5  Shoulder internal rotation 4+/5  Shoulder external rotation 4/5  Elbow flexion 5/5  Elbow extension 4/5  Wrist flexion 4/5  Wrist extension 4/5  Wrist ulnar deviation 3+/5  Wrist radial deviation 4-/5  Wrist pronation 4-/5  Wrist supination 4-/5  (Blank rows = not tested)  HAND FUNCTION: Grip strength: Right: 56 lbs; Left: 30 lbs, Lateral pinch: Right: 15 lbs, Left: 4 lbs, and 3 point pinch: Right: 11 lbs, Left: 3 lbs  COORDINATION: 9 Hole Peg test: Right: 29.42" sec; Left: 1'08" sec  SENSATION: Pt reports tingling and numbness throughout LUE from shoulder to hand  EDEMA: mild edema in left hand   TODAY'S TREATMENT:                                                                                                                              DATE:  01/27/23 -Scapular Strengthening: red theraband, extension, retraction, rows, protraction, x10 -Shoulder Strengthening: red theraband, flexion, abduction, horizontal abduction, er, IR, x10 -Wrist Strengthening: 2lb dumbbell, flexion/extension, ulnar/radial deviation, supination/pronation, x10 -Digit ROM: composite flexion, finger taps, abduction/adduction, opposition, x10 -Theraputty: red theraputty, roll into a ball, flatten into pancake, roll into log, lateral pinch x10, tripod pinch x10, roll into ball, squeeze x10 -Pinch Strengthening: red, green, and blue resistance clips, lateral pinch up on vertical pole, tripod pinch down on horizontal pole, x5 each  01/20/23: -shoulder strengthening: seated, 2#,  protraction, flexion, horizontal abduction, er/IR, abduction, 10 reps -Proximal shoulder strengthening: seated-paddles, criss cross, circles each direction, 10 reps each -Scapular strengthening: row, extension, retraction, green theraband, 10 reps -Overhead lacing: seated, lacing from top down then removing -Grip strengthening: all large beads hand gripper at 25#,  7 medium beads at 25# -Coin manipulation: pt holding coins in left hand, working on in hand translation to fingertips, then placing in slotted container. 3 trials, min difficulty with task, increased time for success.  PATIENT EDUCATION: Education details: English as a second language teacher Person educated: Patient Education method: Explanation, Demonstration, and Handouts Education comprehension: verbalized understanding and returned demonstration  HOME EXERCISE PROGRAM: Eval: shoulder strengthening, red theraputty grip/pinch strengthening 2/20: Scapular Strengthening   GOALS: Goals reviewed with patient? Yes  SHORT TERM GOALS: Target date: 02/10/23  Pt will be provided with and educated on HEP to improve LUE use as non-dominant during ADLs.  Goal status: IN PROGRESS  2.  Pt will increase LUE strength to 4+/5 to improve ability to perform self-care tasks such as bathing, dressing, and grooming independently.   Goal status: IN PROGRESS  3.  Pt will increase her left grip strength by 12# and pinch strength by 4# to improve ability to grip and open bottles, jars, and containers.   Goal status: IN PROGRESS  4.  Pt will improve left hand coordination required for dressing tasks, housekeeping tasks, by completing 9 hole peg test in under 45."  Goal status: IN PROGRESS  5.  Pt will decrease pain the LUE to 3/10 or less to improve ability to use during functional tasks without compensatory strategies.   Goal status: IN PROGRESS   ASSESSMENT:  CLINICAL IMPRESSION: Pt continues to report feeling weak and unable to move her arms like  she should. This session continued to focus on overall strength from her shoulders down to her wrist and fingers. She demonstrated poor control with the red theraband during shoulder exercises, requiring frequent verbal and tactile cuing to slow down and control her movements, not allowing the band to pull her. During pinch strengthening pt required multiple visual demonstrations for the proper pinch technique. Throughout the session verbal, tactile, and visual cuing was provided for technique, positioning, and sequencing.   PERFORMANCE DEFICITS: in functional skills including ADLs, IADLs, coordination, strength, pain, Fine motor control, vision, and UE functional use    PLAN:  OT FREQUENCY: 2x/week  OT DURATION: 4 weeks  PLANNED INTERVENTIONS: self care/ADL training, therapeutic exercise, therapeutic activity, neuromuscular re-education, patient/family education, DME and/or AE instructions, and Re-evaluation  RECOMMENDED OTHER SERVICES: Speech therapy evaluation  CONSULTED AND AGREED WITH PLAN OF CARE: Patient  PLAN FOR NEXT SESSION: continue LUE strengthening trialing therapy ball strengthening, grip and pinch strengthening, coordination tasks; update HEP   Paulita Fujita, OTR/L [phone] 01/27/2023, 9:49 AM

## 2023-01-29 ENCOUNTER — Ambulatory Visit (HOSPITAL_COMMUNITY): Payer: Medicaid Other | Admitting: Physical Therapy

## 2023-01-29 ENCOUNTER — Ambulatory Visit: Payer: Medicaid Other | Admitting: Neurology

## 2023-01-30 ENCOUNTER — Encounter (HOSPITAL_COMMUNITY): Payer: Medicaid Other | Admitting: Occupational Therapy

## 2023-02-02 ENCOUNTER — Ambulatory Visit (HOSPITAL_COMMUNITY): Payer: Medicaid Other | Admitting: Occupational Therapy

## 2023-02-02 ENCOUNTER — Encounter (HOSPITAL_COMMUNITY): Payer: Self-pay | Admitting: Occupational Therapy

## 2023-02-02 DIAGNOSIS — R29818 Other symptoms and signs involving the nervous system: Secondary | ICD-10-CM | POA: Diagnosis not present

## 2023-02-02 DIAGNOSIS — R278 Other lack of coordination: Secondary | ICD-10-CM | POA: Diagnosis not present

## 2023-02-02 DIAGNOSIS — R262 Difficulty in walking, not elsewhere classified: Secondary | ICD-10-CM | POA: Diagnosis not present

## 2023-02-02 DIAGNOSIS — M6281 Muscle weakness (generalized): Secondary | ICD-10-CM | POA: Diagnosis not present

## 2023-02-02 NOTE — Patient Instructions (Signed)

## 2023-02-02 NOTE — Therapy (Signed)
OUTPATIENT OCCUPATIONAL THERAPY NEURO TREATMENT NOTE  Patient Name: Kayla Choi MRN: [id number] DOB:[birth year], 57 y.o., female Today's Date: 02/02/2023  PCP: [facility] PROVIDER: Yves Dill, NP   END OF SESSION:  OT End of Session - 02/02/23 1426     Visit Number 4    Number of Visits 8    Date for OT Re-Evaluation 02/12/23    Authorization Type HB Medicaid    Authorization Time Period 27 visits combined PT/OT/ST. Is also receiving PT. 5 visits approved 2/6-03/13/23    Authorization - Visit Number 3    Authorization - Number of Visits 5    OT Start Time [id number]    OT Stop Time 1426    OT Time Calculation (min) 39 min    Activity Tolerance Patient tolerated treatment well    Behavior During Therapy WFL for tasks assessed/performed               Past Medical History:  Diagnosis Date   Acid reflux    ADD (attention deficit disorder)    Anemia    iron deficinecy   Anxiety    with social phobia   Asthma    Balance problem    Chest pain    that occurs with anxiety   COPD (chronic obstructive pulmonary disease) (HCC)    Depression    Diabetes mellitus    Glaucoma    Hyperlipidemia    Hypertension    Normal cardiac stress test 12/2015   low risk study   Panic attack    Prediabetes    Recurrent falls    Social phobia    Past Surgical History:  Procedure Laterality Date   ABDOMINAL HYSTERECTOMY     fibroids, both ovaries left intact   ANKLE SURGERY     CHOLECYSTECTOMY     COLONOSCOPY N/A 10/18/2019   Procedure: COLONOSCOPY;  Surgeon: Daneil Dolin, MD;  [facility];  Service: Endoscopy;  Laterality: N/A;  1:00-office rescheduled to 11/10 @ 12:45pm   COSMETIC SURGERY     elbow   FRACTURE SURGERY     Recurrent elbow surgery   LIPOMA EXCISION  04/30/2012   Procedure: EXCISION LIPOMA;  Surgeon: Donato Heinz, MD;  Location: [facility];  Service: General;  Laterality: Right;  Excision soft tissue mass right thigh    MASS EXCISION Right 12/19/2020   Procedure: EXCISION CYST; 2 CM; AXILLA;  Surgeon: Virl Cagey, MD;  Location: [facility];  Service: General;  Laterality: Right;   ORIF ANKLE FRACTURE Right 12/15/2017   Procedure: OPEN REDUCTION INTERNAL FIXATION (ORIF) RIGHT ANKLE FRACTURE;  Surgeon: Renette Butters, MD;  Location: [place];  Service: Orthopedics;  Laterality: Right;   POLYPECTOMY  10/18/2019   Procedure: POLYPECTOMY;  Surgeon: Daneil Dolin, MD;  [facility];  Service: Endoscopy;;  colon   right elbow     reconstruction   Patient Active Problem List   Diagnosis Date Noted   Left sided numbness 12/01/2022   Class 2 obesity 09/02/2017   Recurrent falls 05/13/2016   Sebaceous cyst of right axilla 03/10/2016   Onychomycosis of toenail 02/28/2015   Knee pain 10/06/2014   Neck muscle spasm 07/02/2014   Back pain 10/23/2013   Headache 10/05/2012   Inadequate social support 06/25/2012   Microalbuminuria 06/25/2012   Diabetes mellitus, type II ([place]) 04/02/2012   Keloid scar of skin 09/03/2011   COPD (chronic obstructive pulmonary disease) ([place]) 05/21/2007   Hyperlipidemia  01/29/2007   Anxiety state 11/02/2006   MDD (major depressive disorder) 11/02/2006   Essential hypertension 11/02/2006    ONSET DATE: 12/01/22  REFERRING DIAG: s/p CVA  THERAPY DIAG:  Other symptoms and signs involving the nervous system  Other lack of coordination  Rationale for Evaluation and Treatment: Rehabilitation  SUBJECTIVE:   SUBJECTIVE STATEMENT: S: I did laundry before I came, it took me forever to fold a pair of pants.   PERTINENT HISTORY: Pt is a 57 y/o female s/p right CVA on 12/01/22. Pt with symptoms of left sided numbness and weakness, vision deficits. Pt with continued weakness and motor planning/coordination deficits in the LUE.  PRECAUTIONS: None  WEIGHT BEARING RESTRICTIONS: No  PAIN:  Are you having pain? No  FALLS: Has patient fallen in last 6  months? Yes. Number of falls 1  PATIENT GOALS: To get the left hand to do what I want it to do.   OBJECTIVE:   HAND DOMINANCE: Right  ADLs: Overall ADLs: Pt reports difficulty with holding her phone, opening jars and bottle lids, taking a bath, cooking, tying things.    FUNCTIONAL OUTCOME MEASURES: Quick Dash: 93.18  UPPER EXTREMITY ROM:      A/ROM is WNL throughout LUE  UPPER EXTREMITY MMT:     MMT Left eval  Shoulder flexion 4/5  Shoulder abduction 4/5  Shoulder internal rotation 4+/5  Shoulder external rotation 4/5  Elbow flexion 5/5  Elbow extension 4/5  Wrist flexion 4/5  Wrist extension 4/5  Wrist ulnar deviation 3+/5  Wrist radial deviation 4-/5  Wrist pronation 4-/5  Wrist supination 4-/5  (Blank rows = not tested)  HAND FUNCTION: Grip strength: Right: 56 lbs; Left: 30 lbs, Lateral pinch: Right: 15 lbs, Left: 4 lbs, and 3 point pinch: Right: 11 lbs, Left: 3 lbs  COORDINATION: 9 Hole Peg test: Right: 29.42" sec; Left: 1'08" sec  SENSATION: Pt reports tingling and numbness throughout LUE from shoulder to hand  EDEMA: mild edema in left hand   TODAY'S TREATMENT:                                                                                                                              DATE:  02/02/23 -Therapy ball strengthening: green ball-chest press, flexion, overhead press, circles each direction, 10 reps each -Scapular Strengthening: red theraband-extension, retraction, rows, protraction, x10 -Shoulder Strengthening: red theraband-flexion, abduction, horizontal abduction, er, IR, x10 -Overhead lacing: seated, lacing from top down then removing -Ball on wall: 1' flexion -Grip strengthening: all large beads hand gripper at 29#, 7 medium beads at 22#  01/27/23 -Scapular Strengthening: red theraband, extension, retraction, rows, protraction, x10 -Shoulder Strengthening: red theraband, flexion, abduction, horizontal abduction, er, IR, x10 -Wrist  Strengthening: 2lb dumbbell, flexion/extension, ulnar/radial deviation, supination/pronation, x10 -Digit ROM: composite flexion, finger taps, abduction/adduction, opposition, x10 -Theraputty: red theraputty, roll into a ball, flatten into pancake, roll into log, lateral pinch x10, tripod pinch x10, roll into ball, squeeze  x10 -Pinch Strengthening: red, green, and blue resistance clips, lateral pinch up on vertical pole, tripod pinch down on horizontal pole, x5 each  01/20/23: -shoulder strengthening: seated, 2#, protraction, flexion, horizontal abduction, er/IR, abduction, 10 reps -Proximal shoulder strengthening: seated-paddles, criss cross, circles each direction, 10 reps each -Scapular strengthening: row, extension, retraction, green theraband, 10 reps -Overhead lacing: seated, lacing from top down then removing -Grip strengthening: all large beads hand gripper at 25#, 7 medium beads at 25# -Coin manipulation: pt holding coins in left hand, working on in hand translation to fingertips, then placing in slotted container. 3 trials, min difficulty with task, increased time for success.  PATIENT EDUCATION: Education details: therapy ball strengthening Person educated: Patient Education method: Explanation, Demonstration, and Handouts Education comprehension: verbalized understanding and returned demonstration  HOME EXERCISE PROGRAM: Eval: shoulder strengthening, red theraputty grip/pinch strengthening 2/20: Scapular Strengthening 2/26: therapy ball strengthening   GOALS: Goals reviewed with patient? Yes  SHORT TERM GOALS: Target date: 02/10/23  Pt will be provided with and educated on HEP to improve LUE use as non-dominant during ADLs.  Goal status: IN PROGRESS  2.  Pt will increase LUE strength to 4+/5 to improve ability to perform self-care tasks such as bathing, dressing, and grooming independently.   Goal status: IN PROGRESS  3.  Pt will increase her left grip strength by 12#  and pinch strength by 4# to improve ability to grip and open bottles, jars, and containers.   Goal status: IN PROGRESS  4.  Pt will improve left hand coordination required for dressing tasks, housekeeping tasks, by completing 9 hole peg test in under 45."  Goal status: IN PROGRESS  5.  Pt will decrease pain the LUE to 3/10 or less to improve ability to use during functional tasks without compensatory strategies.   Goal status: IN PROGRESS   ASSESSMENT:  CLINICAL IMPRESSION: Pt reports she folded laundry and it took her a long time but she was able to do it. Continued with shoulder and scapular strengthening and stability work, adding therapy ball exercises and continuing with red theraband strengthening tasks. Added ball on wall, mod difficulty maintaining elbow extension, pt reporting fatigue during task. Continued with hand gripper activity, increasing for large beads, reducing back to 22# for medium beads due to weakness. Verbal and tactile cuing for form and technique during exercises.   PERFORMANCE DEFICITS: in functional skills including ADLs, IADLs, coordination, strength, pain, Fine motor control, vision, and UE functional use    PLAN:  OT FREQUENCY: 2x/week  OT DURATION: 4 weeks  PLANNED INTERVENTIONS: self care/ADL training, therapeutic exercise, therapeutic activity, neuromuscular re-education, patient/family education, DME and/or AE instructions, and Re-evaluation  RECOMMENDED OTHER SERVICES: Speech therapy evaluation  CONSULTED AND AGREED WITH PLAN OF CARE: Patient  PLAN FOR NEXT SESSION: continue LUE strengthening, grip and pinch strengthening, coordination tasks-trial small pegboard using in-hand manipulation or placing with tweezers   Guadelupe Sabin, OTR/L  [phone] 02/02/2023, 2:26 PM

## 2023-02-02 NOTE — Patient Instructions (Signed)
Therapy Ball Strengthening Exercises: Complete all exercises 10-15X each, 2x/day.   1) Overhead press: Hold a medicine ball or other ball at your chest. Next, extend your elbows and push the ball over your head as shown. Return to starting position and repeat.     2) Chest press: Hold a medicine ball or other ball at your chest. Next, extend your elbows and push the ball outward in front of your body as shown. Return to starting position and repeat.     3) Ball circles:  Hold a medicine ball or other ball at your chest. Next, extend your elbows and push the ball outward in front of your body as shown. Then, move the ball in a circular pattern for several revolutions and then reverse the direction.     4) Flexion: Start holding an exercise ball out in front of your body with elbows extended. Then, slowly lift the ball up over head. Return the ball to starting position and repeat.

## 2023-02-04 ENCOUNTER — Ambulatory Visit (HOSPITAL_COMMUNITY): Payer: Medicaid Other | Admitting: Physical Therapy

## 2023-02-05 ENCOUNTER — Ambulatory Visit (HOSPITAL_COMMUNITY): Payer: Medicaid Other | Admitting: Occupational Therapy

## 2023-02-05 ENCOUNTER — Encounter (HOSPITAL_COMMUNITY): Payer: Self-pay | Admitting: Occupational Therapy

## 2023-02-05 DIAGNOSIS — R278 Other lack of coordination: Secondary | ICD-10-CM | POA: Diagnosis not present

## 2023-02-05 DIAGNOSIS — R29818 Other symptoms and signs involving the nervous system: Secondary | ICD-10-CM | POA: Diagnosis not present

## 2023-02-05 DIAGNOSIS — R262 Difficulty in walking, not elsewhere classified: Secondary | ICD-10-CM | POA: Diagnosis not present

## 2023-02-05 DIAGNOSIS — M6281 Muscle weakness (generalized): Secondary | ICD-10-CM | POA: Diagnosis not present

## 2023-02-05 NOTE — Therapy (Signed)
OUTPATIENT OCCUPATIONAL THERAPY NEURO TREATMENT NOTE  Patient Name: Bella Sabio MRN: [id number] DOB:01-11-1966, 57 y.o., female Today's Date: 02/05/2023  PCP: [facility] PROVIDER: Yves Dill, NP   END OF SESSION:  OT End of Session - 02/05/23 1150     Visit Number 5    Number of Visits 8    Date for OT Re-Evaluation 02/12/23    Authorization Type [place] Medicaid    Authorization Time Period 27 visits combined PT/OT/ST. Is also receiving PT. 5 visits approved 2/6-03/13/23    Authorization - Visit Number 4    Authorization - Number of Visits 5    OT Start Time [id number]    OT Stop Time 1156    OT Time Calculation (min) 38 min    Activity Tolerance Patient tolerated treatment well    Behavior During Therapy WFL for tasks assessed/performed                Past Medical History:  Diagnosis Date   Acid reflux    ADD (attention deficit disorder)    Anemia    iron deficinecy   Anxiety    with social phobia   Asthma    Balance problem    Chest pain    that occurs with anxiety   COPD (chronic obstructive pulmonary disease) (HCC)    Depression    Diabetes mellitus    Glaucoma    Hyperlipidemia    Hypertension    Normal cardiac stress test 12/2015   low risk study   Panic attack    Prediabetes    Recurrent falls    Social phobia    Past Surgical History:  Procedure Laterality Date   ABDOMINAL HYSTERECTOMY     fibroids, both ovaries left intact   ANKLE SURGERY     CHOLECYSTECTOMY     COLONOSCOPY N/A 10/18/2019   Procedure: COLONOSCOPY;  Surgeon: Daneil Dolin, MD;  [facility]: [facility];  Service: Endoscopy;  Laterality: N/A;  1:00-office rescheduled to 11/10 @ 12:45pm   COSMETIC SURGERY     elbow   FRACTURE SURGERY     Recurrent elbow surgery   LIPOMA EXCISION  04/30/2012   Procedure: EXCISION LIPOMA;  Surgeon: Donato Heinz, MD;  Location: [facility];  Service: General;  Laterality: Right;  Excision soft tissue mass right thigh    MASS EXCISION Right 12/19/2020   Procedure: EXCISION CYST; 2 CM; AXILLA;  Surgeon: Virl Cagey, MD;  Location: [facility];  Service: General;  Laterality: Right;   ORIF ANKLE FRACTURE Right 12/15/2017   Procedure: OPEN REDUCTION INTERNAL FIXATION (ORIF) RIGHT ANKLE FRACTURE;  Surgeon: Renette Butters, MD;  Location: [facility];  Service: Orthopedics;  Laterality: Right;   POLYPECTOMY  10/18/2019   Procedure: POLYPECTOMY;  Surgeon: Daneil Dolin, MD;  Location: [facility];  Service: Endoscopy;;  colon   right elbow     reconstruction   Patient Active Problem List   Diagnosis Date Noted   Left sided numbness 12/01/2022   Class 2 obesity 09/02/2017   Recurrent falls 05/13/2016   Sebaceous cyst of right axilla 03/10/2016   Onychomycosis of toenail 02/28/2015   Knee pain 10/06/2014   Neck muscle spasm 07/02/2014   Back pain 10/23/2013   Headache 10/05/2012   Inadequate social support 06/25/2012   Microalbuminuria 06/25/2012   Diabetes mellitus, type II ([place]) 04/02/2012   Keloid scar of skin 09/03/2011   COPD (chronic obstructive pulmonary disease) ([place]) 05/21/2007  Hyperlipidemia 01/29/2007   Anxiety state 11/02/2006   MDD (major depressive disorder) 11/02/2006   Essential hypertension 11/02/2006    ONSET DATE: 12/01/22  REFERRING DIAG: s/p CVA  THERAPY DIAG:  Other symptoms and signs involving the nervous system  Other lack of coordination  Rationale for Evaluation and Treatment: Rehabilitation  SUBJECTIVE:   SUBJECTIVE STATEMENT: S: I think I'm using my hand a little better.   PERTINENT HISTORY: Pt is a 57 y/o female s/p right CVA on 12/01/22. Pt with symptoms of left sided numbness and weakness, vision deficits. Pt with continued weakness and motor planning/coordination deficits in the LUE.  PRECAUTIONS: None  WEIGHT BEARING RESTRICTIONS: No  PAIN:  Are you having pain? No  FALLS: Has patient fallen in last 6 months? Yes. Number of falls  1  PATIENT GOALS: To get the left hand to do what I want it to do.   OBJECTIVE:   HAND DOMINANCE: Right  ADLs: Overall ADLs: Pt reports difficulty with holding her phone, opening jars and bottle lids, taking a bath, cooking, tying things.    FUNCTIONAL OUTCOME MEASURES: Quick Dash: 93.18  UPPER EXTREMITY ROM:      A/ROM is WNL throughout LUE  UPPER EXTREMITY MMT:     MMT Left eval  Shoulder flexion 4/5  Shoulder abduction 4/5  Shoulder internal rotation 4+/5  Shoulder external rotation 4/5  Elbow flexion 5/5  Elbow extension 4/5  Wrist flexion 4/5  Wrist extension 4/5  Wrist ulnar deviation 3+/5  Wrist radial deviation 4-/5  Wrist pronation 4-/5  Wrist supination 4-/5  (Blank rows = not tested)  HAND FUNCTION: Grip strength: Right: 56 lbs; Left: 30 lbs, Lateral pinch: Right: 15 lbs, Left: 4 lbs, and 3 point pinch: Right: 11 lbs, Left: 3 lbs  COORDINATION: 9 Hole Peg test: Right: 29.42" sec; Left: 1'08" sec  SENSATION: Pt reports tingling and numbness throughout LUE from shoulder to hand  EDEMA: mild edema in left hand   TODAY'S TREATMENT:                                                                                                                              DATE:  02/05/23 -Scapular Strengthening: red theraband-extension, retraction, rows, protraction, x10 -Shoulder Strengthening: red theraband-flexion, abduction, horizontal abduction, er, IR, x10 -Therapy ball strengthening: green ball-chest press, flexion, overhead press, circles each direction, 10 reps each -Grip strengthening: all large beads hand gripper at 29# -Small pegboard: pt holding pegs in palm, working on in-hand translation to bring to fingertips and place into pegboard, while maintaining hold on remaining pegs. Increased time for task, good ability to maintain hold, occasionally dropping pegs.  02/02/23 -Therapy ball strengthening: green ball-chest press, flexion, overhead press, circles  each direction, 10 reps each -Scapular Strengthening: red theraband-extension, retraction, rows, protraction, x10 -Shoulder Strengthening: red theraband-flexion, abduction, horizontal abduction, er, IR, x10 -Overhead lacing: seated, lacing from top down then removing -Ball on wall: 1' flexion -Grip strengthening:  all large beads hand gripper at 29#, 7 medium beads at 22#  01/27/23 -Scapular Strengthening: red theraband, extension, retraction, rows, protraction, x10 -Shoulder Strengthening: red theraband, flexion, abduction, horizontal abduction, er, IR, x10 -Wrist Strengthening: 2lb dumbbell, flexion/extension, ulnar/radial deviation, supination/pronation, x10 -Digit ROM: composite flexion, finger taps, abduction/adduction, opposition, x10 -Theraputty: red theraputty, roll into a ball, flatten into pancake, roll into log, lateral pinch x10, tripod pinch x10, roll into ball, squeeze x10 -Pinch Strengthening: red, green, and blue resistance clips, lateral pinch up on vertical pole, tripod pinch down on horizontal pole, x5 each   PATIENT EDUCATION: Education details: therapy ball strengthening Person educated: Patient Education method: Consulting civil engineer, Demonstration, and Handouts Education comprehension: verbalized understanding and returned demonstration  HOME EXERCISE PROGRAM: Eval: shoulder strengthening, red theraputty grip/pinch strengthening 2/20: Scapular Strengthening 2/26: therapy ball strengthening   GOALS: Goals reviewed with patient? Yes  SHORT TERM GOALS: Target date: 02/10/23  Pt will be provided with and educated on HEP to improve LUE use as non-dominant during ADLs.  Goal status: IN PROGRESS  2.  Pt will increase LUE strength to 4+/5 to improve ability to perform self-care tasks such as bathing, dressing, and grooming independently.   Goal status: IN PROGRESS  3.  Pt will increase her left grip strength by 12# and pinch strength by 4# to improve ability to grip and open  bottles, jars, and containers.   Goal status: IN PROGRESS  4.  Pt will improve left hand coordination required for dressing tasks, housekeeping tasks, by completing 9 hole peg test in under 45."  Goal status: IN PROGRESS  5.  Pt will decrease pain the LUE to 3/10 or less to improve ability to use during functional tasks without compensatory strategies.   Goal status: IN PROGRESS   ASSESSMENT:  CLINICAL IMPRESSION: Pt reports her 2nd and 3rd digits continue to feel like they are going to lock up sometimes. Although feels like she can use her hand more during ADLs. Continued with shoulder strengthening and stability first, then progressed to grip strengthening and coordination tasks. Increased time for completing hand gripper task today, cuing to take a rest break for recovery. Added coordination activity, slow pace but pt able to complete tasks successfully. Verbal cuing for form and technique throughout session exercises and tasks.   PERFORMANCE DEFICITS: in functional skills including ADLs, IADLs, coordination, strength, pain, Fine motor control, vision, and UE functional use    PLAN:  OT FREQUENCY: 2x/week  OT DURATION: 4 weeks  PLANNED INTERVENTIONS: self care/ADL training, therapeutic exercise, therapeutic activity, neuromuscular re-education, patient/family education, DME and/or AE instructions, and Re-evaluation  RECOMMENDED OTHER SERVICES: Speech therapy evaluation  CONSULTED AND AGREED WITH PLAN OF CARE: Patient  PLAN FOR NEXT SESSION: continue LUE strengthening, grip and pinch strengthening, coordination tasks-trial small pegboard placing with tweezers   Guadelupe Sabin, OTR/L  [phone] 02/05/2023, 11:58 AM

## 2023-02-06 ENCOUNTER — Ambulatory Visit (HOSPITAL_COMMUNITY): Payer: Medicaid Other

## 2023-02-09 ENCOUNTER — Encounter: Payer: Self-pay | Admitting: Neurology

## 2023-02-09 ENCOUNTER — Ambulatory Visit: Payer: Medicaid Other | Admitting: Neurology

## 2023-02-09 VITALS — BP 156/89

## 2023-02-09 DIAGNOSIS — R200 Anesthesia of skin: Secondary | ICD-10-CM | POA: Diagnosis not present

## 2023-02-09 DIAGNOSIS — I6381 Other cerebral infarction due to occlusion or stenosis of small artery: Secondary | ICD-10-CM

## 2023-02-09 DIAGNOSIS — I6990 Unspecified sequelae of unspecified cerebrovascular disease: Secondary | ICD-10-CM | POA: Diagnosis not present

## 2023-02-09 DIAGNOSIS — R7309 Other abnormal glucose: Secondary | ICD-10-CM | POA: Diagnosis not present

## 2023-02-09 DIAGNOSIS — Z13220 Encounter for screening for lipoid disorders: Secondary | ICD-10-CM | POA: Diagnosis not present

## 2023-02-09 MED ORDER — TOPIRAMATE 25 MG PO TABS: 25.0000 mg | ORAL_TABLET | Freq: Two times a day (BID) | ORAL | 3 refills | Status: DC

## 2023-02-09 NOTE — Progress Notes (Signed)
[facility] [address] [place]. [place] [address] [phone]       OFFICE CONSULT NOTE  [name] Date of Birth:  1966-09-08 Medical Record Number:  [id number]    Referring MD : Kayla Choi Reason for Referral: Stroke  HPI: Kayla Choi is a 57 year old pleasant African-American lady seen today for initial office consultation visit for stroke.  History is obtained from the patient and review of electronic medical records and I have reviewed pertinent available imaging films in PACS. She has past medical history of hypertension, hyperlipidemia, diabetes, COPD, acid reflux, iron deficiency anemia and asthma.  Patient stated that she was driving on [id number] when she developed sudden onset of numbness involving left side of the face and arm.  She went to [facility] emergency room.  Initially code stroke was not activated due to her difficult symptoms.  She had an MRI at the next day which showed a  large 1.9 x 1.4 x 2.9 cm right corona radiata infarct with MR angiogram showing multifocal right M1 stenosis and moderate left P2 stenosis.  However CT angiogram does not show significant narrowing of any large intracranial vessels.  2D echo showed normal ejection fraction of 60 to 65% without cardiac source of embolism.  Hemoglobin A1c was elevated at 11.2 and LDL cholesterol at 148.  Urine drug screen was negative.  Patient was not started on aspirin due to allergy history and strict given Plavix alone.  She states she has done well since discharge made gradual improvement.  She still has weakness in the left hand and hand muscles.  She still has some intermittent numbness in the left face and body.  She has also noticed some short-term memory and cognitive difficulties poststroke.  She is currently doing occupational and physical therapy and finds it helpful.  Having still persistent paresthesias and discomfort and pain in the left hand.  She has tried taking Tylenol which has  not helped.  He has not undergone any outpatient cardiac monitoring for his A-fib.  He clinical diabetes is under better control and she has an upcoming visit.  Tolerating Crestor well without muscle aches and pains.  Her blood pressure is usually better at home though it is slightly elevated today at 156/89.  She has not had any other recurrent stroke or TIA symptoms.  She denies any prior history of DVT, pulm embolism or stroke in the in the family. ROS:   14 system review of systems is positive for numbness, pain, tingling, weakness, difficulty walking, memory difficulties all other systems negative  PMH:  Past Medical History:  Diagnosis Date   Acid reflux    ADD (attention deficit disorder)    Anemia    iron deficinecy   Anxiety    with social phobia   Asthma    Balance problem    Chest pain    that occurs with anxiety   COPD (chronic obstructive pulmonary disease) (HCC)    Depression    Diabetes mellitus    Glaucoma    Hyperlipidemia    Hypertension    Normal cardiac stress test 12/2015   low risk study   Panic attack    Prediabetes    Recurrent falls    Social phobia     Social History:  Social History   Socioeconomic History   Marital status: Single    Spouse name: Not on file   Number of children: Not on file   Years of education: Not on file  Highest education level: Not on file  Occupational History   Not on file  Tobacco Use   Smoking status: Never   Smokeless tobacco: Never  Vaping Use   Vaping Use: Never used  Substance and Sexual Activity   Alcohol use: No    Alcohol/week: 0.0 standard drinks of alcohol   Drug use: No   Sexual activity: Yes    Birth control/protection: Surgical  Other Topics Concern   Not on file  Social History Narrative   Not on file   Social Determinants of Health   Financial Resource Strain: Not on file  Food Insecurity: No Food Insecurity (12/02/2022)   Hunger Vital Sign    Worried About Running Out of Food in the  Last Year: Never true    Ran Out of Food in the Last Year: Never true  Transportation Needs: No Transportation Needs (12/02/2022)   PRAPARE - Hydrologist (Medical): No    Lack of Transportation (Non-Medical): No  Physical Activity: Not on file  Stress: Not on file  Social Connections: Not on file  Intimate Partner Violence: Not At Risk (12/02/2022)   Humiliation, Afraid, Rape, and Kick questionnaire    Fear of Current or Ex-Partner: No    Emotionally Abused: No    Physically Abused: No    Sexually Abused: No    Medications:   Current Outpatient Medications on File Prior to Visit  Medication Sig Dispense Refill   acetaminophen (TYLENOL) 325 MG tablet Take 325 mg by mouth every 6 (six) hours as needed for moderate pain or headache.      albuterol (PROVENTIL) (2.5 MG/3ML) 0.083% nebulizer solution Take 3 mLs (2.5 mg total) by nebulization every 4 (four) hours as needed for wheezing or shortness of breath. 75 mL 2   albuterol (VENTOLIN HFA) 108 (90 Base) MCG/ACT inhaler Inhale 2 puffs into the lungs every 4 (four) hours as needed for wheezing or shortness of breath. 18 g 2   amLODipine (NORVASC) 5 MG tablet Take 1 tablet (5 mg total) by mouth daily. 30 tablet 0   clopidogrel (PLAVIX) 75 MG tablet Take 1 tablet (75 mg total) by mouth daily. 30 tablet 2   dapagliflozin propanediol (FARXIGA) 10 MG TABS tablet Take 10 mg by mouth daily before breakfast. 30 tablet 3   gabapentin (NEURONTIN) 300 MG capsule Take 300 mg by mouth 3 (three) times daily.     glipiZIDE (GLUCOTROL) 10 MG tablet Take 1 tablet (10 mg total) by mouth 2 (two) times daily with a meal. 180 tablet 0   HUMULIN R 100 UNIT/ML injection Inject into the skin. Inject 11 units before breakfast and 5 units before lunch and dinner.     HYDROcodone-acetaminophen (NORCO/VICODIN) 5-325 MG tablet Take 1 tablet by mouth every 4 (four) hours as needed. 10 tablet 0   lisinopril-hydrochlorothiazide (ZESTORETIC)  20-12.5 MG tablet TAKE ONE TABLET BY MOUTH ONCE DAILY. 90 tablet 3   loratadine (CLARITIN) 10 MG tablet Take 1 tablet (10 mg total) by mouth daily. 30 tablet 11   rosuvastatin (CRESTOR) 20 MG tablet Take 1 tablet (20 mg total) by mouth at bedtime. (Patient taking differently: Take 10 mg by mouth at bedtime.) 90 tablet 3   Tiotropium Bromide Monohydrate (SPIRIVA RESPIMAT) 1.25 MCG/ACT AERS Inhale 2 puffs into the lungs daily. 4 g 0   pioglitazone (ACTOS) 45 MG tablet Take 45 mg by mouth daily.     No current facility-administered medications on file prior to visit.  Allergies:   Allergies  Allergen Reactions   Aspirin Shortness Of Breath and Itching   Metformin And Related Anaphylaxis   Other Anaphylaxis    Green peas   Oxycodone Itching   Diphenhydramine Hcl Rash   Latex Itching and Rash   Neosporin [Neomycin-Bacitracin Zn-Polymyx] Swelling, Rash and Other (See Comments)    skin peeling    Physical Exam General: Mildly obese pleasant middle-aged African-American lady, seated, in no evident distress Head: head normocephalic and atraumatic.   Neck: supple with no carotid or supraclavicular bruits Cardiovascular: regular rate and rhythm, no murmurs Musculoskeletal: no deformity Skin:  no rash/petichiae Vascular:  Normal pulses all extremities  Neurologic Exam Mental Status: Awake and fully alert. Oriented to place and time. Recent and remote memory intact. Attention span, concentration and fund of knowledge appropriate. Mood and affect appropriate.  Cranial Nerves: Fundoscopic exam reveals sharp disc margins. Pupils equal, briskly reactive to light. Extraocular movements full without nystagmus. Visual fields full to confrontation. Hearing intact. Facial sensation intact.  Mild left nasolabial fold asymmetry, tongue, palate moves normally and symmetrically.  Motor: Normal bulk and tone. Normal strength in all tested extremity muscles except left grip weakness.  Diminished fine finger  movements on the left.  Orbits right over left upper extremity.  Trace weakness of left hip flexors and ankle dorsiflexors on the left.. Sensory.:  Diminished left face and body pinprick sensation Coordination: Rapid alternating movements normal in all extremities. Finger-to-nose and heel-to-shin performed accurately bilaterally. Gait and Station: Arises from chair without difficulty. Stance is normal. Gait demonstrates normal stride length and balance with slight dragging of the left leg..  Not able to heel, toe and tandem walk without difficulty.  Reflexes: 1+ and asymmetric and brisker on the left. Toes downgoing.   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  He is still having significant left-sided poststroke paresthesias and mild weakness and mild cognitive difficulties     PLAN:I had a long d/w patient about her recent lacunar stroke, risk for recurrent stroke/TIAs, personally independently reviewed imaging studies and stroke evaluation results and answered questions.Continue Plavix   for secondary stroke prevention and maintain strict control of hypertension with blood pressure goal below 130/90, diabetes with hemoglobin A1c goal below 6.5% and lipids with LDL cholesterol goal below 70 mg/dL. I also advised the patient to eat a healthy diet with plenty of whole grains, cereals, fruits and vegetables, exercise regularly and maintain ideal body weight .check follow-up lipid profile and hemoglobin A1c.  Trial of Topamax 25 mg twice daily for poststroke paresthesias and pain.  Continue ongoing physical occupational therapy.  I also encouraged her to increase participation in cognitively challenging activities like solving crossword puzzles, playing bridge and sudoku.  Recommend 30-day heart  monitor for paroxysmal A-fib.  Followup in the future with my nurse practitioner in 3 to 4 months or call earlier if necessary.  Antony Contras, MD Note: This document was prepared with digital dictation and possible smart phrase technology. Any transcriptional errors that result from this process are unintentional.

## 2023-02-09 NOTE — Patient Instructions (Signed)
I had a long d/w patient about her recent lacunar stroke, risk for recurrent stroke/TIAs, personally independently reviewed imaging studies and stroke evaluation results and answered questions.Continue Plavix   for secondary stroke prevention and maintain strict control of hypertension with blood pressure goal below 130/90, diabetes with hemoglobin A1c goal below 6.5% and lipids with LDL cholesterol goal below 70 mg/dL. I also advised the patient to eat a healthy diet with plenty of whole grains, cereals, fruits and vegetables, exercise regularly and maintain ideal body weight .check follow-up lipid profile and hemoglobin A1c.  Trial of Topamax 25 mg twice daily for poststroke paresthesias and pain.  Continue ongoing physical occupational therapy.  Recommend 30-day heart monitor for paroxysmal A-fib.  Followup in the future with my nurse practitioner in 3 to 4 months or call earlier if necessary.  Stroke Prevention Some medical conditions and behaviors can lead to a higher chance of having a stroke. You can help prevent a stroke by eating healthy, exercising, not smoking, and managing any medical conditions you have. Stroke is a leading cause of functional impairment. Primary prevention is particularly important because a majority of strokes are first-time events. Stroke changes the lives of not only those who experience a stroke but also their family and other caregivers. How can this condition affect me? A stroke is a medical emergency and should be treated right away. A stroke can lead to brain damage and can sometimes be life-threatening. If a person gets medical treatment right away, there is a better chance of surviving and recovering from a stroke. What can increase my risk? The following medical conditions may increase your risk of a stroke: Cardiovascular disease. High blood pressure (hypertension). Diabetes. High cholesterol. Sickle cell disease. Blood clotting disorders (hypercoagulable  state). Obesity. Sleep disorders (obstructive sleep apnea). Other risk factors include: Being older than age 10. Having a history of blood clots, stroke, or mini-stroke (transient ischemic attack, TIA). Genetic factors, such as race, ethnicity, or a family history of stroke. Smoking cigarettes or using other tobacco products. Taking birth control pills, especially if you also use tobacco. Heavy use of alcohol or drugs, especially cocaine and methamphetamine. Physical inactivity. What actions can I take to prevent this? Manage your health conditions High cholesterol levels. Eating a healthy diet is important for preventing high cholesterol. If cholesterol cannot be managed through diet alone, you may need to take medicines. Take any prescribed medicines to control your cholesterol as told by your health care provider. Hypertension. To reduce your risk of stroke, try to keep your blood pressure below 130/80. Eating a healthy diet and exercising regularly are important for controlling blood pressure. If these steps are not enough to manage your blood pressure, you may need to take medicines. Take any prescribed medicines to control hypertension as told by your health care provider. Ask your health care provider if you should monitor your blood pressure at home. Have your blood pressure checked every year, even if your blood pressure is normal. Blood pressure increases with age and some medical conditions. Diabetes. Eating a healthy diet and exercising regularly are important parts of managing your blood sugar (glucose). If your blood sugar cannot be managed through diet and exercise, you may need to take medicines. Take any prescribed medicines to control your diabetes as told by your health care provider. Get evaluated for obstructive sleep apnea. Talk to your health care provider about getting a sleep evaluation if you snore a lot or have excessive sleepiness. Make sure that any  other  medical conditions you have, such as atrial fibrillation or atherosclerosis, are managed. Nutrition Follow instructions from your health care provider about what to eat or drink to help manage your health condition. These instructions may include: Reducing your daily calorie intake. Limiting how much salt (sodium) you use to 1,500 milligrams (mg) each day. Using only healthy fats for cooking, such as olive oil, canola oil, or sunflower oil. Eating healthy foods. You can do this by: Choosing foods that are high in fiber, such as whole grains, and fresh fruits and vegetables. Eating at least 5 servings of fruits and vegetables a day. Try to fill one-half of your plate with fruits and vegetables at each meal. Choosing lean protein foods, such as lean cuts of meat, poultry without skin, fish, tofu, beans, and nuts. Eating low-fat dairy products. Avoiding foods that are high in sodium. This can help lower blood pressure. Avoiding foods that have saturated fat, trans fat, and cholesterol. This can help prevent high cholesterol. Avoiding processed and prepared foods. Counting your daily carbohydrate intake.  Lifestyle If you drink alcohol: Limit how much you have to: 0-1 drink a day for women who are not pregnant. 0-2 drinks a day for men. Know how much alcohol is in your drink. In the [place] one drink equals one 12 oz bottle of beer (335m), one 5 oz glass of wine (141m, or one 1 oz glass of hard liquor (4428m Do not use any products that contain nicotine or tobacco. These products include cigarettes, chewing tobacco, and vaping devices, such as e-cigarettes. If you need help quitting, ask your health care provider. Avoid secondhand smoke. Do not use drugs. Activity  Try to stay at a healthy weight. Get at least 30 minutes of exercise on most days, such as: Fast walking. Biking. Swimming. Medicines Take over-the-counter and prescription medicines only as told by your health care  provider. Aspirin or blood thinners (antiplatelets or anticoagulants) may be recommended to reduce your risk of forming blood clots that can lead to stroke. Avoid taking birth control pills. Talk to your health care provider about the risks of taking birth control pills if: You are over 35 69ars old. You smoke. You get very bad headaches. You have had a blood clot. Where to find more information American Stroke Association: www.strokeassociation.org Get help right away if: You or a loved one has any symptoms of a stroke. "BE FAST" is an easy way to remember the main warning signs of a stroke: B - Balance. Signs are dizziness, sudden trouble walking, or loss of balance. E - Eyes. Signs are trouble seeing or a sudden change in vision. F - Face. Signs are sudden weakness or numbness of the face, or the face or eyelid drooping on one side. A - Arms. Signs are weakness or numbness in an arm. This happens suddenly and usually on one side of the body. S - Speech. Signs are sudden trouble speaking, slurred speech, or trouble understanding what people say. T - Time. Time to call emergency services. Write down what time symptoms started. You or a loved one has other signs of a stroke, such as: A sudden, severe headache with no known cause. Nausea or vomiting. Seizure. These symptoms may represent a serious problem that is an emergency. Do not wait to see if the symptoms will go away. Get medical help right away. Call your local emergency services (911 in the [place] Do not drive yourself to the hospital. Summary You can help to prevent  a stroke by eating healthy, exercising, not smoking, limiting alcohol intake, and managing any medical conditions you may have. Do not use any products that contain nicotine or tobacco. These include cigarettes, chewing tobacco, and vaping devices, such as e-cigarettes. If you need help quitting, ask your health care provider. Remember "BE FAST" for warning signs of a  stroke. Get help right away if you or a loved one has any of these signs. This information is not intended to replace advice given to you by your health care provider. Make sure you discuss any questions you have with your health care provider. Document Revised: 06/07/2020 Document Reviewed: 06/25/2020 Elsevier Patient Education  [employer].

## 2023-02-10 ENCOUNTER — Ambulatory Visit (HOSPITAL_COMMUNITY): Payer: Medicaid Other | Admitting: Physical Therapy

## 2023-02-10 ENCOUNTER — Other Ambulatory Visit: Payer: Self-pay | Admitting: Neurology

## 2023-02-10 ENCOUNTER — Ambulatory Visit (HOSPITAL_COMMUNITY): Payer: Medicaid Other | Attending: Internal Medicine | Admitting: Occupational Therapy

## 2023-02-10 ENCOUNTER — Encounter (HOSPITAL_COMMUNITY): Payer: Self-pay | Admitting: Occupational Therapy

## 2023-02-10 DIAGNOSIS — R262 Difficulty in walking, not elsewhere classified: Secondary | ICD-10-CM | POA: Diagnosis present

## 2023-02-10 DIAGNOSIS — R278 Other lack of coordination: Secondary | ICD-10-CM | POA: Diagnosis present

## 2023-02-10 DIAGNOSIS — R29818 Other symptoms and signs involving the nervous system: Secondary | ICD-10-CM

## 2023-02-10 DIAGNOSIS — M6281 Muscle weakness (generalized): Secondary | ICD-10-CM | POA: Insufficient documentation

## 2023-02-10 LAB — LIPID PANEL
Chol/HDL Ratio: 3 ratio (ref 0.0–4.4)
Cholesterol, Total: 204 mg/dL — ABNORMAL HIGH (ref 100–199)
HDL: 68 mg/dL (ref >39)
LDL Chol Calc (NIH): 123 mg/dL — ABNORMAL HIGH (ref 0–99)
Triglycerides: 71 mg/dL (ref 0–149)
VLDL Cholesterol Cal: 13 mg/dL (ref 5–40)

## 2023-02-10 LAB — HEMOGLOBIN A1C
Est. average glucose Bld gHb Est-mCnc: 212 mg/dL
Hgb A1c MFr Bld: 9 % — ABNORMAL HIGH (ref 4.8–5.6)

## 2023-02-10 MED ORDER — ROSUVASTATIN CALCIUM 40 MG PO TABS: 40.0000 mg | ORAL_TABLET | Freq: Every day | ORAL | 3 refills | Status: DC

## 2023-02-10 NOTE — Therapy (Signed)
OUTPATIENT OCCUPATIONAL THERAPY NEURO TREATMENT NOTE  Patient Name: Danah Bern MRN: [id number] DOB:March 31, 1966, 57 y.o., female Today's Date: 02/10/2023  PCP: [facility] PROVIDER: Yves Dill, NP   END OF SESSION:  OT End of Session - 02/10/23 1344     Visit Number 6    Number of Visits 8    Date for OT Re-Evaluation 02/12/23    Authorization Type [place] Medicaid    Authorization Time Period 27 visits combined PT/OT/ST. Is also receiving PT. 5 visits approved 2/6-03/13/23    Authorization - Visit Number 5    Authorization - Number of Visits 5    OT Start Time [id number]    OT Stop Time 1425    OT Time Calculation (min) 40 min    Activity Tolerance Patient tolerated treatment well    Behavior During Therapy WFL for tasks assessed/performed             Past Medical History:  Diagnosis Date   Acid reflux    ADD (attention deficit disorder)    Anemia    iron deficinecy   Anxiety    with social phobia   Asthma    Balance problem    Chest pain    that occurs with anxiety   COPD (chronic obstructive pulmonary disease) (HCC)    Depression    Diabetes mellitus    Glaucoma    Hyperlipidemia    Hypertension    Normal cardiac stress test 12/2015   low risk study   Panic attack    Prediabetes    Recurrent falls    Social phobia    Past Surgical History:  Procedure Laterality Date   ABDOMINAL HYSTERECTOMY     fibroids, both ovaries left intact   ANKLE SURGERY     CHOLECYSTECTOMY     COLONOSCOPY N/A 10/18/2019   Procedure: COLONOSCOPY;  Surgeon: Daneil Dolin, MD;  Location: [facility];  Service: Endoscopy;  Laterality: N/A;  1:00-office rescheduled to 11/10 @ 12:45pm   COSMETIC SURGERY     elbow   FRACTURE SURGERY     Recurrent elbow surgery   LIPOMA EXCISION  04/30/2012   Procedure: EXCISION LIPOMA;  Surgeon: Donato Heinz, MD;  Location: [facility];  Service: General;  Laterality: Right;  Excision soft tissue mass right thigh   MASS  EXCISION Right 12/19/2020   Procedure: EXCISION CYST; 2 CM; AXILLA;  Surgeon: Virl Cagey, MD;  Location: [facility];  Service: General;  Laterality: Right;   ORIF ANKLE FRACTURE Right 12/15/2017   Procedure: OPEN REDUCTION INTERNAL FIXATION (ORIF) RIGHT ANKLE FRACTURE;  Surgeon: Renette Butters, MD;  Location: [facility];  Service: Orthopedics;  Laterality: Right;   POLYPECTOMY  10/18/2019   Procedure: POLYPECTOMY;  Surgeon: Daneil Dolin, MD;  Location: [facility];  Service: Endoscopy;;  colon   right elbow     reconstruction   Patient Active Problem List   Diagnosis Date Noted   Left sided numbness 12/01/2022   Class 2 obesity 09/02/2017   Recurrent falls 05/13/2016   Sebaceous cyst of right axilla 03/10/2016   Onychomycosis of toenail 02/28/2015   Knee pain 10/06/2014   Neck muscle spasm 07/02/2014   Back pain 10/23/2013   Headache 10/05/2012   Inadequate social support 06/25/2012   Microalbuminuria 06/25/2012   Diabetes mellitus, type II ([place]) 04/02/2012   Keloid scar of skin 09/03/2011   COPD (chronic obstructive pulmonary disease) ([place]) 05/21/2007   Hyperlipidemia 01/29/2007  Anxiety state 11/02/2006   MDD (major depressive disorder) 11/02/2006   Essential hypertension 11/02/2006    ONSET DATE: 12/01/22  REFERRING DIAG: s/p CVA  THERAPY DIAG:  Other symptoms and signs involving the nervous system  Other lack of coordination  Rationale for Evaluation and Treatment: Rehabilitation  SUBJECTIVE:   SUBJECTIVE STATEMENT: S: I think I'm using my hand a little better.   PERTINENT HISTORY: Pt is a 57 y/o female s/p right CVA on 12/01/22. Pt with symptoms of left sided numbness and weakness, vision deficits. Pt with continued weakness and motor planning/coordination deficits in the LUE.  PRECAUTIONS: None  WEIGHT BEARING RESTRICTIONS: No  PAIN:  Are you having pain? No  FALLS: Has patient fallen in last 6 months? Yes. Number of falls  1  PATIENT GOALS: To get the left hand to do what I want it to do.   OBJECTIVE:   HAND DOMINANCE: Right  ADLs: Overall ADLs: Pt reports difficulty with holding her phone, opening jars and bottle lids, taking a bath, cooking, tying things.    FUNCTIONAL OUTCOME MEASURES: Quick Dash: 93.18 02/10/23: 11.36  UPPER EXTREMITY ROM:      A/ROM is WNL throughout LUE  UPPER EXTREMITY MMT:     MMT Left eval Left 02/10/23  Shoulder flexion 4/5 5/5  Shoulder abduction 4/5 5/5  Shoulder internal rotation 4+/5 5/5  Shoulder external rotation 4/5 4+/5  Elbow flexion 5/5 5/5  Elbow extension 4/5 5/5  Wrist flexion 4/5 5/5  Wrist extension 4/5 5/5  Wrist ulnar deviation 3+/5 5/5  Wrist radial deviation 4-/5 5/5  Wrist pronation 4-/5 4+/5  Wrist supination 4-/5 5/5  (Blank rows = not tested)  HAND FUNCTION: Grip strength: Right: 56 lbs; Left: 30 lbs, Lateral pinch: Right: 15 lbs, Left: 4 lbs, and 3 point pinch: Right: 11 lbs, Left: 3 lbs Grip strength: (02/10/23) Left: 35 lbs, Lateral pinch: Left: 9 lbs, and 3 point pinch: Left: 8 lbs  COORDINATION: 9 Hole Peg test: Right: 29.42" sec; Left: 1'08" sec 9 Hole Peg test: (02/10/23) Left: 39.11" sec  SENSATION: Pt reports tingling and numbness throughout LUE from shoulder to hand (02/10/23) Pt reports mild numbness on the dorsal and palmar aspect of hand  EDEMA: mild edema in left hand (02/10/23): no edema noted   TODAY'S TREATMENT:                                                                                                                              DATE:  02/10/23: -Pinch strengthening: red, green, and blue resistance clips, lateral pinch up on vertical pole, tripod pinch down on horizontal pole, x5 each -Screws: holding all screws, using in hand translation to balance each screw on its head - 5 large screws, 6 medium screws, 8 small screws, 10 tiny screws -Digiflex: 3lb, full squeeze x10, each digit squeeze x6 -Gripper: 28lbs stacking  6 cubes, 32lbs stacking 6 cubes, 35lbs squeeze x10  02/05/23 -Scapular Strengthening:  red theraband-extension, retraction, rows, protraction, x10 -Shoulder Strengthening: red theraband-flexion, abduction, horizontal abduction, er, IR, x10 -Therapy ball strengthening: green ball-chest press, flexion, overhead press, circles each direction, 10 reps each -Grip strengthening: all large beads hand gripper at 29# -Small pegboard: pt holding pegs in palm, working on in-hand translation to bring to fingertips and place into pegboard, while maintaining hold on remaining pegs. Increased time for task, good ability to maintain hold, occasionally dropping pegs.  02/02/23 -Therapy ball strengthening: green ball-chest press, flexion, overhead press, circles each direction, 10 reps each -Scapular Strengthening: red theraband-extension, retraction, rows, protraction, x10 -Shoulder Strengthening: red theraband-flexion, abduction, horizontal abduction, er, IR, x10 -Overhead lacing: seated, lacing from top down then removing -Ball on wall: 1' flexion -Grip strengthening: all large beads hand gripper at 29#, 7 medium beads at 22#   PATIENT EDUCATION: Education details: using screws or coins to work on in Clinical biochemist Person educated: Patient Education method: Consulting civil engineer, Media planner, and Handouts Education comprehension: verbalized understanding and returned demonstration  HOME EXERCISE PROGRAM: Eval: shoulder strengthening, red theraputty grip/pinch strengthening 2/20: Scapular Strengthening 2/26: therapy ball strengthening 3/5: using screws or coins to work on in hand translation   GOALS: Goals reviewed with patient? Yes  SHORT TERM GOALS: Target date: 02/10/23  Pt will be provided with and educated on HEP to improve LUE use as non-dominant during ADLs.  Goal status: IN PROGRESS  2.  Pt will increase LUE strength to 5/5 to improve ability to perform self-care tasks such as bathing, dressing,  and grooming independently.   Goal status: REVISED  3.  Pt will increase her left grip strength by 12# and pinch strength by 4# to improve ability to grip and open bottles, jars, and containers.   Goal status: IN PROGRESS  4.  Pt will improve left hand coordination required for dressing tasks, housekeeping tasks, by completing 9 hole peg test in under 35"  Goal status: REVISED  5.  Pt will decrease pain the LUE to 3/10 or less to improve ability to use during functional tasks without compensatory strategies.   Goal status: IN PROGRESS   ASSESSMENT:  CLINICAL IMPRESSION: This session, pt was seen for her reassessment. She demonstrates good progress with ROM, strength, and coordination. At this time she continues to present below the norm of her age and sex for grip strength and coordination, as well as continuing to have decreased sensation on the palmar aspect of her hand limiting her from independently completing things such as cooking and even medication management. OT recommending the patient continue to receive therapy for 1 time per week for 4 more weeks, to continue working towards improved grip strength, coordination, activity tolerance, and compensatory strategies for the lack of sensation. Verbal and tactile cuing provided throughout session for positioning and technique.   PERFORMANCE DEFICITS: in functional skills including ADLs, IADLs, coordination, strength, pain, Fine motor control, vision, and UE functional use    PLAN:  OT FREQUENCY: 1x/week  OT DURATION: 4 weeks  PLANNED INTERVENTIONS: self care/ADL training, therapeutic exercise, therapeutic activity, neuromuscular re-education, patient/family education, DME and/or AE instructions, and Re-evaluation  RECOMMENDED OTHER SERVICES: Speech therapy evaluation  CONSULTED AND AGREED WITH PLAN OF CARE: Patient  PLAN FOR NEXT SESSION: continue LUE strengthening, grip and pinch strengthening, coordination tasks-trial  small pegboard placing with tweezers   Paulita Fujita, OTR/L [phone] 02/10/2023, 1:49 PM

## 2023-02-10 NOTE — Progress Notes (Signed)
Kindly inform the patient that both cholesterol profile and screening test for diabetes are not satisfactory.  I recommend she increase the dose of Crestor to 40 mg daily and see primary care physician better diabetes control

## 2023-02-11 ENCOUNTER — Encounter: Payer: Self-pay | Admitting: Neurology

## 2023-02-12 ENCOUNTER — Telehealth: Payer: Self-pay | Admitting: Neurology

## 2023-02-12 ENCOUNTER — Ambulatory Visit (HOSPITAL_COMMUNITY): Payer: Medicaid Other | Admitting: Physical Therapy

## 2023-02-12 ENCOUNTER — Encounter (HOSPITAL_COMMUNITY): Payer: Medicaid Other | Admitting: Occupational Therapy

## 2023-02-12 DIAGNOSIS — I6990 Unspecified sequelae of unspecified cerebrovascular disease: Secondary | ICD-10-CM

## 2023-02-12 NOTE — Telephone Encounter (Signed)
Please change the location to [facility]

## 2023-02-20 ENCOUNTER — Encounter (HOSPITAL_COMMUNITY): Payer: Self-pay | Admitting: Occupational Therapy

## 2023-02-20 ENCOUNTER — Ambulatory Visit (HOSPITAL_COMMUNITY): Payer: Medicaid Other | Admitting: Occupational Therapy

## 2023-02-20 DIAGNOSIS — R262 Difficulty in walking, not elsewhere classified: Secondary | ICD-10-CM

## 2023-02-20 DIAGNOSIS — R29818 Other symptoms and signs involving the nervous system: Secondary | ICD-10-CM | POA: Diagnosis not present

## 2023-02-20 DIAGNOSIS — R278 Other lack of coordination: Secondary | ICD-10-CM

## 2023-02-20 DIAGNOSIS — M6281 Muscle weakness (generalized): Secondary | ICD-10-CM

## 2023-02-20 NOTE — Therapy (Addendum)
OUTPATIENT OCCUPATIONAL THERAPY NEURO TREATMENT NOTE   Patient Name: Kayla Choi MRN: [id number] DOB:1965-12-25, 57 y.o., female Today's Date: 02/20/2023  PCP: [facility] PROVIDER: Yves Dill, NP   END OF SESSION:  OT End of Session - 02/20/23 1348     Visit Number 7    Number of Visits 5    Date for OT Re-Evaluation 04/10/23    Authorization Type HB Medicaid    Authorization Time Period 5 approved OT visists from 3/5-5/3    Authorization - Visit Number 1    Authorization - Number of Visits 5    OT Start Time 1300    OT Stop Time 1345    OT Time Calculation (min) 45 min    Activity Tolerance Patient tolerated treatment well    Behavior During Therapy WFL for tasks assessed/performed              Past Medical History:  Diagnosis Date   Acid reflux    ADD (attention deficit disorder)    Anemia    iron deficinecy   Anxiety    with social phobia   Asthma    Balance problem    Chest pain    that occurs with anxiety   COPD (chronic obstructive pulmonary disease) ([place])    Depression    Diabetes mellitus    Glaucoma    Hyperlipidemia    Hypertension    Normal cardiac stress test 12/2015   low risk study   Panic attack    Prediabetes    Recurrent falls    Social phobia    Past Surgical History:  Procedure Laterality Date   ABDOMINAL HYSTERECTOMY     fibroids, both ovaries left intact   ANKLE SURGERY     CHOLECYSTECTOMY     COLONOSCOPY N/A 10/18/2019   Procedure: COLONOSCOPY;  Surgeon: Daneil Dolin, MD;  [facility];  Service: Endoscopy;  Laterality: N/A;  1:00-office rescheduled to 11/10 @ 12:45pm   COSMETIC SURGERY     elbow   FRACTURE SURGERY     Recurrent elbow surgery   LIPOMA EXCISION  04/30/2012   Procedure: EXCISION LIPOMA;  Surgeon: Donato Heinz, MD;  Location: [facility];  Service: General;  Laterality: Right;  Excision soft tissue mass right thigh   MASS EXCISION Right 12/19/2020   Procedure:  EXCISION CYST; 2 CM; AXILLA;  Surgeon: Virl Cagey, MD;  Location: [facility];  Service: General;  Laterality: Right;   ORIF ANKLE FRACTURE Right 12/15/2017   Procedure: OPEN REDUCTION INTERNAL FIXATION (ORIF) RIGHT ANKLE FRACTURE;  Surgeon: Renette Butters, MD;  Location: [facility];  Service: Orthopedics;  Laterality: Right;   POLYPECTOMY  10/18/2019   Procedure: POLYPECTOMY;  Surgeon: Daneil Dolin, MD;  [facility];  Service: Endoscopy;;  colon   right elbow     reconstruction   Patient Active Problem List   Diagnosis Date Noted   Left sided numbness 12/01/2022   Class 2 obesity 09/02/2017   Recurrent falls 05/13/2016   Sebaceous cyst of right axilla 03/10/2016   Onychomycosis of toenail 02/28/2015   Knee pain 10/06/2014   Neck muscle spasm 07/02/2014   Back pain 10/23/2013   Headache 10/05/2012   Inadequate social support 06/25/2012   Microalbuminuria 06/25/2012   Diabetes mellitus, type II ([place]) 04/02/2012   Keloid scar of skin 09/03/2011   COPD (chronic obstructive pulmonary disease) ([place]) 05/21/2007   Hyperlipidemia 01/29/2007   Anxiety state 11/02/2006  MDD (major depressive disorder) 11/02/2006   Essential hypertension 11/02/2006    ONSET DATE: 12/01/22  REFERRING DIAG: s/p CVA  THERAPY DIAG:  Other symptoms and signs involving the nervous system  Other lack of coordination  Difficulty in walking, not elsewhere classified  Muscle weakness (generalized)  Rationale for Evaluation and Treatment: Rehabilitation  SUBJECTIVE:   SUBJECTIVE STATEMENT: S: My grand daughter has been making me pick up little rubber bands off of the carpet with my L hand.   PERTINENT HISTORY: Pt is a 57 y/o female s/p right CVA on 12/01/22. Pt with symptoms of left sided numbness and weakness, vision deficits. Pt with continued weakness and motor planning/coordination deficits in the LUE.  PRECAUTIONS: None  WEIGHT BEARING RESTRICTIONS: No  PAIN:   Are you having pain? No  FALLS: Has patient fallen in last 6 months? Yes. Number of falls 1  PATIENT GOALS: To get the left hand to do what I want it to do.   OBJECTIVE:   HAND DOMINANCE: Right  ADLs: Overall ADLs: Pt reports difficulty with holding her phone, opening jars and bottle lids, taking a bath, cooking, tying things.    FUNCTIONAL OUTCOME MEASURES: Quick Dash: 93.18 02/10/23: 11.36  UPPER EXTREMITY ROM:      A/ROM is WNL throughout LUE  UPPER EXTREMITY MMT:     MMT Left eval Left 02/10/23  Shoulder flexion 4/5 5/5  Shoulder abduction 4/5 5/5  Shoulder internal rotation 4+/5 5/5  Shoulder external rotation 4/5 4+/5  Elbow flexion 5/5 5/5  Elbow extension 4/5 5/5  Wrist flexion 4/5 5/5  Wrist extension 4/5 5/5  Wrist ulnar deviation 3+/5 5/5  Wrist radial deviation 4-/5 5/5  Wrist pronation 4-/5 4+/5  Wrist supination 4-/5 5/5  (Blank rows = not tested)  HAND FUNCTION: Grip strength: Right: 56 lbs; Left: 30 lbs, Lateral pinch: Right: 15 lbs, Left: 4 lbs, and 3 point pinch: Right: 11 lbs, Left: 3 lbs Grip strength: (02/10/23) Left: 35 lbs, Lateral pinch: Left: 9 lbs, and 3 point pinch: Left: 8 lbs  COORDINATION: 9 Hole Peg test: Right: 29.42" sec; Left: 1'08" sec 9 Hole Peg test: (02/10/23) Left: 39.11" sec  SENSATION: Pt reports tingling and numbness throughout LUE from shoulder to hand (02/10/23) Pt reports mild numbness on the dorsal and palmar aspect of hand  EDEMA: mild edema in left hand (02/10/23): no edema noted   TODAY'S TREATMENT:                                                                                                                              DATE:   02/20/23 -Shoulder strengthening: shoulder press 10x with 3 lb DB, shoulder flexion 10x with 3 lb DB, bicep curls 10x with 3 LB DB - Pinch strength/dexterity: picking up small colored pegs to place into board with silver tweezers with 3 pinch grip, tip pinch squeezing theraputty log   -Coordination/tip pinch: using tweezers to pick up perfection puzzle  pieces and place in puzzle   02/10/23: -Pinch strengthening: red, green, and blue resistance clips, lateral pinch up on vertical pole, tripod pinch down on horizontal pole, x5 each -Screws: holding all screws, using in hand translation to balance each screw on its head - 5 large screws, 6 medium screws, 8 small screws, 10 tiny screws -Digiflex: 3lb, full squeeze x10, each digit squeeze x6 -Gripper: 28lbs stacking 6 cubes, 32lbs stacking 6 cubes, 35lbs squeeze x10  02/05/23 -Scapular Strengthening: red theraband-extension, retraction, rows, protraction, x10 -Shoulder Strengthening: red theraband-flexion, abduction, horizontal abduction, er, IR, x10 -Therapy ball strengthening: green ball-chest press, flexion, overhead press, circles each direction, 10 reps each -Grip strengthening: all large beads hand gripper at 29# -Small pegboard: pt holding pegs in palm, working on in-hand translation to bring to fingertips and place into pegboard, while maintaining hold on remaining pegs. Increased time for task, good ability to maintain hold, occasionally dropping pegs.   PATIENT EDUCATION: Education details: using screws or coins to work on in Clinical biochemist Person educated: Patient Education method: Consulting civil engineer, Media planner, and Handouts Education comprehension: verbalized understanding and returned demonstration  HOME EXERCISE PROGRAM: Eval: shoulder strengthening, red theraputty grip/pinch strengthening 2/20: Scapular Strengthening 2/26: therapy ball strengthening 3/5: using screws or coins to work on in hand translation   GOALS: Goals reviewed with patient? Yes  SHORT TERM GOALS: Target date: 02/10/23  Pt will be provided with and educated on HEP to improve LUE use as non-dominant during ADLs.  Goal status: IN PROGRESS  2.  Pt will increase LUE strength to 5/5 to improve ability to perform self-care tasks such as  bathing, dressing, and grooming independently.   Goal status: REVISED  3.  Pt will increase her left grip strength by 12# and pinch strength by 4# to improve ability to grip and open bottles, jars, and containers.   Goal status: IN PROGRESS  4.  Pt will improve left hand coordination required for dressing tasks, housekeeping tasks, by completing 9 hole peg test in under 35"  Goal status: REVISED  5.  Pt will decrease pain the LUE to 3/10 or less to improve ability to use during functional tasks without compensatory strategies.   Goal status: IN PROGRESS   ASSESSMENT:  CLINICAL IMPRESSION: Pt reports that she is working on her strength at home. She stated that she does activities with her grand daughter where she uses left digits to pick up small rubber bands from the carpet. Session focused on using three finger grasp on tweezers to pick up small pegs and place into peg board. Gave pt HEP activities for tweezers at home.   PERFORMANCE DEFICITS: in functional skills including ADLs, IADLs, coordination, strength, pain, Fine motor control, vision, and UE functional use    PLAN:  OT FREQUENCY: 1x/week  OT DURATION: 4 weeks  PLANNED INTERVENTIONS: self care/ADL training, therapeutic exercise, therapeutic activity, neuromuscular re-education, patient/family education, DME and/or AE instructions, and Re-evaluation  RECOMMENDED OTHER SERVICES: Speech therapy evaluation  CONSULTED AND AGREED WITH PLAN OF CARE: Patient  PLAN FOR NEXT SESSION: continue LUE strengthening, grip and pinch strengthening, coordination tasks-trial small pegboard placing with tweezers   Arvil Persons, OTR/L [phone] 02/20/2023, 1:50 PM

## 2023-02-20 NOTE — Patient Instructions (Signed)
TWEEZER ACTIVITIES  Playing operation with grand daughter Using tweezers to pick up beads (20-25) from table and place into container  Using tweezers to remove beads from theraputty   USING THUMB, FIRST FINGER, and MIDDLE FINGER ON TWEEZERS.

## 2023-02-24 ENCOUNTER — Other Ambulatory Visit: Payer: Self-pay | Admitting: Neurology

## 2023-02-24 ENCOUNTER — Encounter (HOSPITAL_COMMUNITY): Payer: Self-pay | Admitting: Occupational Therapy

## 2023-02-24 ENCOUNTER — Ambulatory Visit (HOSPITAL_COMMUNITY): Payer: Medicaid Other | Admitting: Occupational Therapy

## 2023-02-24 DIAGNOSIS — R29818 Other symptoms and signs involving the nervous system: Secondary | ICD-10-CM

## 2023-02-24 DIAGNOSIS — I480 Paroxysmal atrial fibrillation: Secondary | ICD-10-CM

## 2023-02-24 DIAGNOSIS — I639 Cerebral infarction, unspecified: Secondary | ICD-10-CM

## 2023-02-24 DIAGNOSIS — I6990 Unspecified sequelae of unspecified cerebrovascular disease: Secondary | ICD-10-CM

## 2023-02-24 DIAGNOSIS — R200 Anesthesia of skin: Secondary | ICD-10-CM

## 2023-02-24 DIAGNOSIS — R9431 Abnormal electrocardiogram [ECG] [EKG]: Secondary | ICD-10-CM

## 2023-02-24 DIAGNOSIS — R278 Other lack of coordination: Secondary | ICD-10-CM

## 2023-02-24 NOTE — Telephone Encounter (Signed)
New ordered placed for heart monitor specifying [facility] location.

## 2023-02-24 NOTE — Addendum Note (Signed)
Addended by: Gildardo Griffes on: 02/24/2023 02:17 PM   Modules accepted: Orders

## 2023-02-24 NOTE — Therapy (Signed)
OUTPATIENT OCCUPATIONAL THERAPY NEURO TREATMENT NOTE   Patient Name: Kayla Choi MRN: [id number] DOB:[birth year], 57 y.o., female Today's Date: 02/24/2023  PCP: [facility] PROVIDER: Yves Dill, NP   END OF SESSION:  OT End of Session - 02/24/23 1300     Visit Number 8    Number of Visits 11    Date for OT Re-Evaluation 04/10/23    Authorization Type [place] Medicaid    Authorization Time Period 5 approved OT visists from 3/5-5/3    Authorization - Visit Number 2    Authorization - Number of Visits 5    OT Start Time [id number]    OT Stop Time 1345    OT Time Calculation (min) 40 min    Activity Tolerance Patient tolerated treatment well    Behavior During Therapy WFL for tasks assessed/performed              Past Medical History:  Diagnosis Date   Acid reflux    ADD (attention deficit disorder)    Anemia    iron deficinecy   Anxiety    with social phobia   Asthma    Balance problem    Chest pain    that occurs with anxiety   COPD (chronic obstructive pulmonary disease) ([place])    Depression    Diabetes mellitus    Glaucoma    Hyperlipidemia    Hypertension    Normal cardiac stress test 12/2015   low risk study   Panic attack    Prediabetes    Recurrent falls    Social phobia    Past Surgical History:  Procedure Laterality Date   ABDOMINAL HYSTERECTOMY     fibroids, both ovaries left intact   ANKLE SURGERY     CHOLECYSTECTOMY     COLONOSCOPY N/A 10/18/2019   Procedure: COLONOSCOPY;  Surgeon: Daneil Dolin, MD;  [facility];  Service: Endoscopy;  Laterality: N/A;  1:00-office rescheduled to 11/10 @ 12:45pm   COSMETIC SURGERY     elbow   FRACTURE SURGERY     Recurrent elbow surgery   LIPOMA EXCISION  04/30/2012   Procedure: EXCISION LIPOMA;  Surgeon: Donato Heinz, MD;  Location[facility];  Service: General;  Laterality: Right;  Excision soft tissue mass right thigh   MASS EXCISION Right 12/19/2020   Procedure:  EXCISION CYST; 2 CM; AXILLA;  Surgeon: Virl Cagey, MD;  Location[facility];  Service: General;  Laterality: Right;   ORIF ANKLE FRACTURE Right 12/15/2017   Procedure: OPEN REDUCTION INTERNAL FIXATION (ORIF) RIGHT ANKLE FRACTURE;  Surgeon: Renette Butters, MD;  Location: [facility];  Service: Orthopedics;  Laterality: Right;   POLYPECTOMY  10/18/2019   Procedure: POLYPECTOMY;  Surgeon: Daneil Dolin, MD;  [facility];  Service: Endoscopy;;  colon   right elbow     reconstruction   Patient Active Problem List   Diagnosis Date Noted   Left sided numbness 12/01/2022   Class 2 obesity 09/02/2017   Recurrent falls 05/13/2016   Sebaceous cyst of right axilla 03/10/2016   Onychomycosis of toenail 02/28/2015   Knee pain 10/06/2014   Neck muscle spasm 07/02/2014   Back pain 10/23/2013   Headache 10/05/2012   Inadequate social support 06/25/2012   Microalbuminuria 06/25/2012   Diabetes mellitus, type II ([place]) 04/02/2012   Keloid scar of skin 09/03/2011   COPD (chronic obstructive pulmonary disease) ([place]) 05/21/2007   Hyperlipidemia 01/29/2007   Anxiety state 11/02/2006  MDD (major depressive disorder) 11/02/2006   Essential hypertension 11/02/2006    ONSET DATE: 12/01/22  REFERRING DIAG: s/p CVA  THERAPY DIAG:  Other symptoms and signs involving the nervous system  Other lack of coordination  Rationale for Evaluation and Treatment: Rehabilitation  SUBJECTIVE:   SUBJECTIVE STATEMENT: S: My grand daughter has been making me pick up little rubber bands off of the carpet with my L hand.   PERTINENT HISTORY: Pt is a 57 y/o female s/p right CVA on 12/01/22. Pt with symptoms of left sided numbness and weakness, vision deficits. Pt with continued weakness and motor planning/coordination deficits in the LUE.  PRECAUTIONS: None  WEIGHT BEARING RESTRICTIONS: No  PAIN:  Are you having pain? No  FALLS: Has patient fallen in last 6 months? Yes. Number  of falls 1  PATIENT GOALS: To get the left hand to do what I want it to do.   OBJECTIVE:   HAND DOMINANCE: Right  ADLs: Overall ADLs: Pt reports difficulty with holding her phone, opening jars and bottle lids, taking a bath, cooking, tying things.    FUNCTIONAL OUTCOME MEASURES: Quick Dash: 93.18 02/10/23: 11.36  UPPER EXTREMITY ROM:      A/ROM is WNL throughout LUE  UPPER EXTREMITY MMT:     MMT Left eval Left 02/10/23  Shoulder flexion 4/5 5/5  Shoulder abduction 4/5 5/5  Shoulder internal rotation 4+/5 5/5  Shoulder external rotation 4/5 4+/5  Elbow flexion 5/5 5/5  Elbow extension 4/5 5/5  Wrist flexion 4/5 5/5  Wrist extension 4/5 5/5  Wrist ulnar deviation 3+/5 5/5  Wrist radial deviation 4-/5 5/5  Wrist pronation 4-/5 4+/5  Wrist supination 4-/5 5/5  (Blank rows = not tested)  HAND FUNCTION: Grip strength: Right: 56 lbs; Left: 30 lbs, Lateral pinch: Right: 15 lbs, Left: 4 lbs, and 3 point pinch: Right: 11 lbs, Left: 3 lbs Grip strength: (02/10/23) Left: 35 lbs, Lateral pinch: Left: 9 lbs, and 3 point pinch: Left: 8 lbs  COORDINATION: 9 Hole Peg test: Right: 29.42" sec; Left: 1'08" sec 9 Hole Peg test: (02/10/23) Left: 39.11" sec  SENSATION: Pt reports tingling and numbness throughout LUE from shoulder to hand (02/10/23) Pt reports mild numbness on the dorsal and palmar aspect of hand  EDEMA: mild edema in left hand (02/10/23): no edema noted   TODAY'S TREATMENT:                                                                                                                              DATE:   02/24/23   02/20/23 -Shoulder strengthening: shoulder press 10x with 3 lb DB, shoulder flexion 10x with 3 lb DB, bicep curls 10x with 3 LB DB - Pinch strength/dexterity: picking up small colored pegs to place into board with silver tweezers with 3 pinch grip, tip pinch squeezing theraputty log  -Coordination/tip pinch: using tweezers to pick up perfection puzzle pieces and  place in puzzle   02/10/23: -  Pinch strengthening: red, green, and blue resistance clips, lateral pinch up on vertical pole, tripod pinch down on horizontal pole, x5 each -Screws: holding all screws, using in hand translation to balance each screw on its head - 5 large screws, 6 medium screws, 8 small screws, 10 tiny screws -Digiflex: 3lb, full squeeze x10, each digit squeeze x6 -Gripper: 28lbs stacking 6 cubes, 32lbs stacking 6 cubes, 35lbs squeeze x10  02/05/23 -Scapular Strengthening: red theraband-extension, retraction, rows, protraction, x10 -Shoulder Strengthening: red theraband-flexion, abduction, horizontal abduction, er, IR, x10 -Therapy ball strengthening: green ball-chest press, flexion, overhead press, circles each direction, 10 reps each -Grip strengthening: all large beads hand gripper at 29# -Small pegboard: pt holding pegs in palm, working on in-hand translation to bring to fingertips and place into pegboard, while maintaining hold on remaining pegs. Increased time for task, good ability to maintain hold, occasionally dropping pegs.   PATIENT EDUCATION: Education details: using screws or coins to work on in Clinical biochemist Person educated: Patient Education method: Consulting civil engineer, Media planner, and Handouts Education comprehension: verbalized understanding and returned demonstration  HOME EXERCISE PROGRAM: Eval: shoulder strengthening, red theraputty grip/pinch strengthening 2/20: Scapular Strengthening 2/26: therapy ball strengthening 3/5: using screws or coins to work on in hand translation   GOALS: Goals reviewed with patient? Yes  SHORT TERM GOALS: Target date: 02/10/23  Pt will be provided with and educated on HEP to improve LUE use as non-dominant during ADLs.  Goal status: IN PROGRESS  2.  Pt will increase LUE strength to 5/5 to improve ability to perform self-care tasks such as bathing, dressing, and grooming independently.   Goal status: REVISED  3.  Pt  will increase her left grip strength by 12# and pinch strength by 4# to improve ability to grip and open bottles, jars, and containers.   Goal status: IN PROGRESS  4.  Pt will improve left hand coordination required for dressing tasks, housekeeping tasks, by completing 9 hole peg test in under 35"  Goal status: REVISED  5.  Pt will decrease pain the LUE to 3/10 or less to improve ability to use during functional tasks without compensatory strategies.   Goal status: IN PROGRESS   ASSESSMENT:  CLINICAL IMPRESSION: Pt reports that she is working on her strength at home. She stated that she does activities with her grand daughter where she uses left digits to pick up small rubber bands from the carpet. Session focused on using three finger grasp on tweezers to pick up small pegs and place into peg board. Gave pt HEP activities for tweezers at home.   PERFORMANCE DEFICITS: in functional skills including ADLs, IADLs, coordination, strength, pain, Fine motor control, vision, and UE functional use    PLAN:  OT FREQUENCY: 1x/week  OT DURATION: 4 weeks  PLANNED INTERVENTIONS: self care/ADL training, therapeutic exercise, therapeutic activity, neuromuscular re-education, patient/family education, DME and/or AE instructions, and Re-evaluation  RECOMMENDED OTHER SERVICES: Speech therapy evaluation  CONSULTED AND AGREED WITH PLAN OF CARE: Patient  PLAN FOR NEXT SESSION: continue LUE strengthening, grip and pinch strengthening, coordination tasks-trial small pegboard placing with tweezers   Paulita Fujita, OTR/L [phone] 02/24/2023, 1:02 PM

## 2023-02-26 DIAGNOSIS — E1165 Type 2 diabetes mellitus with hyperglycemia: Secondary | ICD-10-CM | POA: Diagnosis not present

## 2023-02-26 DIAGNOSIS — E7849 Other hyperlipidemia: Secondary | ICD-10-CM | POA: Diagnosis not present

## 2023-02-26 DIAGNOSIS — Z8673 Personal history of transient ischemic attack (TIA), and cerebral infarction without residual deficits: Secondary | ICD-10-CM | POA: Diagnosis not present

## 2023-03-03 ENCOUNTER — Ambulatory Visit: Payer: Medicaid Other | Attending: Neurology

## 2023-03-03 DIAGNOSIS — R200 Anesthesia of skin: Secondary | ICD-10-CM

## 2023-03-03 DIAGNOSIS — I480 Paroxysmal atrial fibrillation: Secondary | ICD-10-CM | POA: Diagnosis not present

## 2023-03-03 DIAGNOSIS — I639 Cerebral infarction, unspecified: Secondary | ICD-10-CM | POA: Diagnosis not present

## 2023-03-03 DIAGNOSIS — I6990 Unspecified sequelae of unspecified cerebrovascular disease: Secondary | ICD-10-CM

## 2023-03-03 DIAGNOSIS — R9431 Abnormal electrocardiogram [ECG] [EKG]: Secondary | ICD-10-CM | POA: Diagnosis not present

## 2023-03-05 ENCOUNTER — Encounter (HOSPITAL_COMMUNITY): Payer: Medicaid Other | Admitting: Occupational Therapy

## 2023-03-06 ENCOUNTER — Ambulatory Visit (HOSPITAL_COMMUNITY): Payer: Medicaid Other | Admitting: Occupational Therapy

## 2023-03-06 ENCOUNTER — Encounter (HOSPITAL_COMMUNITY): Payer: Self-pay | Admitting: Occupational Therapy

## 2023-03-06 DIAGNOSIS — R278 Other lack of coordination: Secondary | ICD-10-CM

## 2023-03-06 DIAGNOSIS — R29818 Other symptoms and signs involving the nervous system: Secondary | ICD-10-CM

## 2023-03-06 NOTE — Therapy (Signed)
OUTPATIENT OCCUPATIONAL THERAPY NEURO REASSESSMENT, TREATMENT NOTE DISCHARGE SUMMARY   Patient Name: Kayla Choi MRN: [id number] DOB:1965-12-26, 57 y.o., female Today's Date: 03/06/2023  PCP: [facility] PROVIDER: Yves Dill, NP   END OF SESSION:  OT End of Session - 03/06/23 1154     Visit Number 9    Number of Visits 11    Date for OT Re-Evaluation 04/10/23    Authorization Type [place] Medicaid    Authorization Time Period 5 approved OT visists from 3/5-5/3    Authorization - Visit Number 3    Authorization - Number of Visits 5    OT Start Time 1115    OT Stop Time 1157    OT Time Calculation (min) 42 min    Activity Tolerance Patient tolerated treatment well    Behavior During Therapy WFL for tasks assessed/performed               Past Medical History:  Diagnosis Date   Acid reflux    ADD (attention deficit disorder)    Anemia    iron deficinecy   Anxiety    with social phobia   Asthma    Balance problem    Chest pain    that occurs with anxiety   COPD (chronic obstructive pulmonary disease) ([place])    Depression    Diabetes mellitus    Glaucoma    Hyperlipidemia    Hypertension    Normal cardiac stress test 12/2015   low risk study   Panic attack    Prediabetes    Recurrent falls    Social phobia    Past Surgical History:  Procedure Laterality Date   ABDOMINAL HYSTERECTOMY     fibroids, both ovaries left intact   ANKLE SURGERY     CHOLECYSTECTOMY     COLONOSCOPY N/A 10/18/2019   Procedure: COLONOSCOPY;  Surgeon: Daneil Dolin, MD;  [facility];  Service: Endoscopy;  Laterality: N/A;  1:00-office rescheduled to 11/10 @ 12:45pm   COSMETIC SURGERY     elbow   FRACTURE SURGERY     Recurrent elbow surgery   LIPOMA EXCISION  04/30/2012   Procedure: EXCISION LIPOMA;  Surgeon: Donato Heinz, MD;  Location: [facility];  Service: General;  Laterality: Right;  Excision soft tissue mass right thigh   MASS  EXCISION Right 12/19/2020   Procedure: EXCISION CYST; 2 CM; AXILLA;  Surgeon: Virl Cagey, MD;  Location: [facility];  Service: General;  Laterality: Right;   ORIF ANKLE FRACTURE Right 12/15/2017   Procedure: OPEN REDUCTION INTERNAL FIXATION (ORIF) RIGHT ANKLE FRACTURE;  Surgeon: Renette Butters, MD;  Location: [facility];  Service: Orthopedics;  Laterality: Right;   POLYPECTOMY  10/18/2019   Procedure: POLYPECTOMY;  Surgeon: Daneil Dolin, MD;  [facility];  Service: Endoscopy;;  colon   right elbow     reconstruction   Patient Active Problem List   Diagnosis Date Noted   Left sided numbness 12/01/2022   Class 2 obesity 09/02/2017   Recurrent falls 05/13/2016   Sebaceous cyst of right axilla 03/10/2016   Onychomycosis of toenail 02/28/2015   Knee pain 10/06/2014   Neck muscle spasm 07/02/2014   Back pain 10/23/2013   Headache 10/05/2012   Inadequate social support 06/25/2012   Microalbuminuria 06/25/2012   Diabetes mellitus, type II ([place]) 04/02/2012   Keloid scar of skin 09/03/2011   COPD (chronic obstructive pulmonary disease) ([place]) 05/21/2007   Hyperlipidemia 01/29/2007  Anxiety state 11/02/2006   MDD (major depressive disorder) 11/02/2006   Essential hypertension 11/02/2006    ONSET DATE: 12/01/22  REFERRING DIAG: s/p CVA  THERAPY DIAG:  Other symptoms and signs involving the nervous system  Other lack of coordination  Rationale for Evaluation and Treatment: Rehabilitation  SUBJECTIVE:   SUBJECTIVE STATEMENT: S: "I've been cooking everything."  PERTINENT HISTORY: Pt is a 57 y/o female s/p right CVA on 12/01/22. Pt with symptoms of left sided numbness and weakness, vision deficits. Pt with continued weakness and motor planning/coordination deficits in the LUE.  PRECAUTIONS: None  WEIGHT BEARING RESTRICTIONS: No  PAIN:  Are you having pain? No  FALLS: Has patient fallen in last 6 months? Yes. Number of falls 1  PATIENT GOALS:  To get the left hand to do what I want it to do.   OBJECTIVE:   HAND DOMINANCE: Right  ADLs: Overall ADLs: Pt reports difficulty with holding her phone, opening jars and bottle lids, taking a bath, cooking, tying things.    FUNCTIONAL OUTCOME MEASURES: Quick Dash: 93.18 02/10/23: 11.36 03/06/23: 13.65  UPPER EXTREMITY ROM:      A/ROM is WNL throughout LUE  UPPER EXTREMITY MMT:     MMT Left eval Left 02/10/23 Left 03/06/23  Shoulder flexion 4/5 5/5   Shoulder abduction 4/5 5/5   Shoulder internal rotation 4+/5 5/5   Shoulder external rotation 4/5 4+/5 5/5  Elbow flexion 5/5 5/5   Elbow extension 4/5 5/5   Wrist flexion 4/5 5/5   Wrist extension 4/5 5/5   Wrist ulnar deviation 3+/5 5/5   Wrist radial deviation 4-/5 5/5   Wrist pronation 4-/5 4+/5 5/5  Wrist supination 4-/5 5/5   (Blank rows = not tested)  HAND FUNCTION: Grip strength: Right: 56 lbs; Left: 30 lbs, Lateral pinch: Right: 15 lbs, Left: 4 lbs, and 3 point pinch: Right: 11 lbs, Left: 3 lbs 02/10/23: Grip strength: Left: 35 lbs, Lateral pinch: Left: 9 lbs, and 3 point pinch: Left: 8 lbs 03/06/23:  Grip strength:Left: 54 lbs, Lateral pinch: Left: 10 lbs, and 3 point pinch: Left: 8 lbs  COORDINATION: 9 Hole Peg test: Right: 29.42" sec; Left: 1'08" sec 02/10/23: 9 Hole Peg test: Left: 39.11" sec 03/06/23: 9 Hole Peg test: Left: 37.83" sec  SENSATION: Pt reports tingling and numbness throughout LUE from shoulder to hand (02/10/23) Pt reports mild numbness on the dorsal and palmar aspect of hand 03/06/23: WFL  EDEMA: mild edema in left hand (02/10/23): no edema noted   TODAY'S TREATMENT:                                                                                                                              DATE:  03/06/23 -Grooved pegboard: pt holding several pegs in palm of hand, translating to her fingertips, then placing into pegboard. Increased time to orient in correct direction, did not drop any pegs. Pt then  removing from pegboard and holding  all pegs in her hand, min difficulty.  -Card Shuffle: pt breaking deck and shuffling, min difficulty breaking deck, min/mod shuffling. 5 successful rounds completed -Tweezer activity: pt placing grooved pegs using tweezers, mod difficulty and increased time required, but pt was able to place 15 pegs successfully   02/24/23 -Therapy Ball Exercises: green therapy ball, chest press, flexion, shoulder press, V ups, circles both directions, x10 -Pinch strengthening: red resistance clip, picking up pegs and placing in the peg board following pattern, mod compensatory strategies to pinch and place pegs -Digiflex: 3lb, full squeeze x10, each digit squeeze x6 -Gripper: 28lbs picking up 10 medium beads, 32lbs full squeeze x10 (unable to pick up beads) -Coin manipulation: picking up 15 coins from table and holding them all, placing 1 coin in the piggy bank at a time.  -Theraputty: red putty, roll into ball, flatten into pancake, roll into log, tripod pinch x10, tip to tip pinch x5 each finger, lateral pinch x10, roll into ball, squeeze x10  02/20/23 -Shoulder strengthening: shoulder press 10x with 3 lb DB, shoulder flexion 10x with 3 lb DB, bicep curls 10x with 3 LB DB - Pinch strength/dexterity: picking up small colored pegs to place into board with silver tweezers with 3 pinch grip, tip pinch squeezing theraputty log  -Coordination/tip pinch: using tweezers to pick up perfection puzzle pieces and place in puzzle    PATIENT EDUCATION: Education details: reviewed HEP, fine motor tasks Person educated: Patient Education method: Consulting civil engineer, Demonstration, and Handouts Education comprehension: verbalized understanding and returned demonstration  HOME EXERCISE PROGRAM: Eval: shoulder strengthening, red theraputty grip/pinch strengthening 2/20: Scapular Strengthening 2/26: therapy ball strengthening 3/5: using screws or coins to work on in hand translation 3/29: Fine  motor work   GOALS: Goals reviewed with patient? Yes  SHORT TERM GOALS: Target date: 02/10/23  Pt will be provided with and educated on HEP to improve LUE use as non-dominant during ADLs.  Goal status: MET  2.  Pt will increase LUE strength to 5/5 to improve ability to perform self-care tasks such as bathing, dressing, and grooming independently.   Goal status: MET  3.  Pt will increase her left grip strength by 12# and pinch strength by 4# to improve ability to grip and open bottles, jars, and containers.   Goal status: MET  4.  Pt will improve left hand coordination required for dressing tasks, housekeeping tasks, by completing 9 hole peg test in under 35"  Goal status: NOT MET  5.  Pt will decrease pain the LUE to 3/10 or less to improve ability to use during functional tasks without compensatory strategies.   Goal status: MET   ASSESSMENT:  CLINICAL IMPRESSION: Pt reports she has been cooking a lot and is doing well, reports she only has "rubberband" feeling in her fingers when she is stressed. She does require increased time for coordination tasks, but is working to use her left hand/arm as normally as possible. Pt has met 4/5 goals, only lacking 2" of time to meet coordination goal. Pt is agreeable to discharge today with HEP. Encouraged pt to continue with fine motor practice and incorporating the LUE into all tasks.   PERFORMANCE DEFICITS: in functional skills including ADLs, IADLs, coordination, strength, pain, Fine motor control, vision, and UE functional use    PLAN:  OT FREQUENCY: 1x/week  OT DURATION: 4 weeks  PLANNED INTERVENTIONS: self care/ADL training, therapeutic exercise, therapeutic activity, neuromuscular re-education, patient/family education, DME and/or AE instructions, and Re-evaluation  RECOMMENDED OTHER SERVICES: Speech  therapy evaluation  CONSULTED AND AGREED WITH PLAN OF CARE: Patient  PLAN FOR NEXT SESSION: continue LUE strengthening, grip  and pinch strengthening, coordination tasks-tsmall pegboard placing with tweezers, work on in Clinical biochemist   [place]  Visits from Start of Care: 9  Current functional level related to goals / functional outcomes: See above. Pt has met 4/5 goals, reports she is using her LUE during ADLs and is now cooking a lot.    Remaining deficits: Continued delayed motor planning and coordination in LUE intermittently dependent on the task   Education / Equipment: HEP for strengthening and coordination   Patient agrees to discharge. Patient goals were met. Patient is being discharged due to meeting the stated rehab goals.Guadelupe Sabin, OTR/L  [phone] 03/06/2023, 11:58 AM

## 2023-03-11 ENCOUNTER — Encounter (HOSPITAL_COMMUNITY): Payer: Medicaid Other | Admitting: Occupational Therapy

## 2023-04-06 ENCOUNTER — Encounter: Payer: Self-pay | Admitting: Neurology

## 2023-04-09 ENCOUNTER — Ambulatory Visit: Payer: Medicaid Other | Admitting: Family Medicine

## 2023-04-09 ENCOUNTER — Encounter: Payer: Self-pay | Admitting: Family Medicine

## 2023-04-09 VITALS — BP 134/82 | HR 107 | Ht 61.0 in | Wt 166.0 lb

## 2023-04-09 DIAGNOSIS — Z13220 Encounter for screening for lipoid disorders: Secondary | ICD-10-CM

## 2023-04-09 DIAGNOSIS — Z131 Encounter for screening for diabetes mellitus: Secondary | ICD-10-CM

## 2023-04-09 DIAGNOSIS — Z1211 Encounter for screening for malignant neoplasm of colon: Secondary | ICD-10-CM

## 2023-04-09 DIAGNOSIS — E559 Vitamin D deficiency, unspecified: Secondary | ICD-10-CM

## 2023-04-09 DIAGNOSIS — G43009 Migraine without aura, not intractable, without status migrainosus: Secondary | ICD-10-CM

## 2023-04-09 DIAGNOSIS — Z1329 Encounter for screening for other suspected endocrine disorder: Secondary | ICD-10-CM

## 2023-04-09 DIAGNOSIS — Z135 Encounter for screening for eye and ear disorders: Secondary | ICD-10-CM

## 2023-04-09 MED ORDER — NURTEC 75 MG PO TBDP
1.0000 | ORAL_TABLET | Freq: Once | ORAL | 1 refills | Status: DC | PRN
Start: 2023-04-09 — End: 2023-04-16

## 2023-04-09 NOTE — Assessment & Plan Note (Signed)
Trial on Nurtec 75 mg once a day PRN Advise methods include deep breathing, cognitive behavioral therapy focusing on changing thoughts, Limit or avoid alcohol, caffeine, chocolate, canner foods, MSG and aspartame.  Implement sleep hygiene includes not watching TV or listen music in bed, don't eat heavy meals within a couple of hours of bedtime, don't use your phone, laptop, or tablet at bedtime

## 2023-04-09 NOTE — Progress Notes (Signed)
New Patient Office Visit   Subjective   Patient ID: Kayla Choi, female    DOB: October 04, 1966  Age: 57 y.o. MRN: [id number]  CC:  Chief Complaint  Patient presents with   Establish Care    HPI Kayla Choi 57 year old female, presents to establish care. She  has a past medical history of Acid reflux, ADD (attention deficit disorder), Anemia, Anxiety, Asthma, Balance problem, Chest pain, COPD (chronic obstructive pulmonary disease) ([facility]), Depression, Diabetes mellitus, Glaucoma, Hyperlipidemia, Hypertension, Normal cardiac stress test (12/2015), Panic attack, Prediabetes, Recurrent falls, and Social phobia.  Migraine  This is a recurrent problem. Patient reports migraine headaches occurs constantly and has been gradually worsening. The pain is located in the Occipital and frontal region. The pain radiates to the right neck and left neck. The quality of the pain is described as squeezing, throbbing and pulsating. The pain is at a severity of 8/10. Associated symptoms include dizziness, a loss of balance, photophobia and scalp tenderness. The symptoms are aggravated by bright light, noise and emotional stress. She has tried acetaminophen for the symptoms. The treatment provided no relief. Her past medical history is significant for hypertension, migraine headaches and obesity.      Outpatient Encounter Medications as of 04/09/2023  Medication Sig   Accu-Chek Softclix Lancets lancets SMARTSIG:2 Topical Twice Daily   acetaminophen (TYLENOL) 325 MG tablet Take 325 mg by mouth every 6 (six) hours as needed for moderate pain or headache.    albuterol (PROVENTIL) (2.5 MG/3ML) 0.083% nebulizer solution Take 3 mLs (2.5 mg total) by nebulization every 4 (four) hours as needed for wheezing or shortness of breath.   albuterol (VENTOLIN HFA) 108 (90 Base) MCG/ACT inhaler Inhale 2 puffs into the lungs every 4 (four) hours as needed for wheezing or shortness of breath.   amLODipine (NORVASC) 5 MG tablet  Take 1 tablet (5 mg total) by mouth daily.   ezetimibe (ZETIA) 10 MG tablet Take 10 mg by mouth daily.   gabapentin (NEURONTIN) 300 MG capsule Take 300 mg by mouth 3 (three) times daily.   HUMULIN R 100 UNIT/ML injection Inject into the skin. Inject 11 units before breakfast and 5 units before lunch and dinner.   LEVEMIR FLEXPEN 100 UNIT/ML FlexPen SMARTSIG:12 Unit(s) SUB-Q Every Night   lisinopril-hydrochlorothiazide (ZESTORETIC) 20-12.5 MG tablet TAKE ONE TABLET BY MOUTH ONCE DAILY.   loratadine (CLARITIN) 10 MG tablet Take 1 tablet (10 mg total) by mouth daily.   pioglitazone (ACTOS) 45 MG tablet Take 45 mg by mouth daily.   Rimegepant Sulfate (NURTEC) 75 MG TBDP Take 1 tablet (75 mg total) by mouth once as needed for up to 1 dose.   rosuvastatin (CRESTOR) 40 MG tablet Take 1 tablet (40 mg total) by mouth at bedtime.   SURE COMFORT INSULIN SYRINGE 31G X 5/16" 0.5 ML MISC SMARTSIG:Injection As Directed   SURE COMFORT PEN NEEDLES 32G X 4 MM MISC    Tiotropium Bromide Monohydrate (SPIRIVA RESPIMAT) 1.25 MCG/ACT AERS Inhale 2 puffs into the lungs daily.   topiramate (TOPAMAX) 25 MG tablet Take 1 tablet (25 mg total) by mouth 2 (two) times daily.   atorvastatin (LIPITOR) 20 MG tablet Take by mouth. (Patient not taking: Reported on 04/09/2023)   budesonide (PULMICORT) 0.25 MG/2ML nebulizer solution Inhale into the lungs. (Patient not taking: Reported on 04/09/2023)   buPROPion (WELLBUTRIN XL) 300 MG 24 hr tablet Take by mouth. (Patient not taking: Reported on 04/09/2023)   dapagliflozin propanediol (FARXIGA) 10 MG TABS tablet  Take 10 mg by mouth daily before breakfast.   glipiZIDE (GLUCOTROL) 10 MG tablet Take 1 tablet (10 mg total) by mouth 2 (two) times daily with a meal. (Patient not taking: Reported on 04/09/2023)   HYDROcodone-acetaminophen (NORCO/VICODIN) 5-325 MG tablet Take 1 tablet by mouth every 4 (four) hours as needed. (Patient not taking: Reported on 04/09/2023)   No facility-administered  encounter medications on file as of 04/09/2023.    Past Surgical History:  Procedure Laterality Date   ABDOMINAL HYSTERECTOMY     fibroids, both ovaries left intact   ANKLE SURGERY     CHOLECYSTECTOMY     COLONOSCOPY N/A 10/18/2019   Procedure: COLONOSCOPY;  Surgeon: Corbin Ade, MD;  [facility];  Service: Endoscopy;  Laterality: N/A;  1:00-office rescheduled to 11/10 @ 12:45pm   COSMETIC SURGERY     elbow   FRACTURE SURGERY     Recurrent elbow surgery   LIPOMA EXCISION  04/30/2012   Procedure: EXCISION LIPOMA;  Surgeon: Fabio Bering, MD;  Location: [facility];  Service: General;  Laterality: Right;  Excision soft tissue mass right thigh   MASS EXCISION Right 12/19/2020   Procedure: EXCISION CYST; 2 CM; AXILLA;  Surgeon: Lucretia Roers, MD;  Location: [facility];  Service: General;  Laterality: Right;   ORIF ANKLE FRACTURE Right 12/15/2017   Procedure: OPEN REDUCTION INTERNAL FIXATION (ORIF) RIGHT ANKLE FRACTURE;  Surgeon: Sheral Apley, MD;  Location: [facility];  Service: Orthopedics;  Laterality: Right;   POLYPECTOMY  10/18/2019   Procedure: POLYPECTOMY;  Surgeon: Corbin Ade, MD;  [facility];  Service: Endoscopy;;  colon   right elbow     reconstruction    Review of Systems  Constitutional:  Negative for chills and fever.  Eyes:  Negative for blurred vision.  Respiratory:  Negative for shortness of breath.   Cardiovascular:  Negative for chest pain.  Gastrointestinal:  Negative for abdominal pain.  Genitourinary:  Negative for dysuria.  Musculoskeletal:  Negative for myalgias.  Neurological:  Positive for dizziness and headaches.      Objective    BP 134/82   Pulse (!) 107   Ht 5\' 1"  (1.549 m)   Wt 166 lb (75.3 kg)   SpO2 96%   BMI 31.37 kg/m   Physical Exam Vitals reviewed.  Constitutional:      General: She is not in acute distress.    Appearance: Normal appearance. She is not ill-appearing, toxic-appearing or  diaphoretic.  HENT:     Head: Normocephalic.  Eyes:     General:        Right eye: No discharge.        Left eye: No discharge.     Conjunctiva/sclera: Conjunctivae normal.  Cardiovascular:     Rate and Rhythm: Normal rate.     Pulses: Normal pulses.     Heart sounds: Normal heart sounds.  Pulmonary:     Effort: Pulmonary effort is normal. No respiratory distress.     Breath sounds: Normal breath sounds.  Abdominal:     General: Bowel sounds are normal.     Palpations: Abdomen is soft.     Tenderness: There is no abdominal tenderness. There is no guarding.  Musculoskeletal:        General: Normal range of motion.     Cervical back: Normal range of motion.  Skin:    General: Skin is warm and dry.     Capillary Refill: Capillary refill  takes less than 2 seconds.  Neurological:     General: No focal deficit present.     Mental Status: She is alert and oriented to person, place, and time.     Coordination: Coordination normal.     Gait: Gait normal.  Psychiatric:        Mood and Affect: Mood normal.       Assessment & Plan:  Screening for diabetes mellitus -     Microalbumin / creatinine urine ratio -     Hemoglobin A1c  Vitamin D deficiency -     VITAMIN D 25 Hydroxy (Vit-D Deficiency, Fractures)  Screening for thyroid disorder -     TSH + free T4  Screening for lipid disorders -     Lipid panel -     CMP14+EGFR -     CBC with Differential/Platelet  Screening for colon cancer -     Ambulatory referral to Gastroenterology  Screening for diabetic retinopathy  Migraine without aura and without status migrainosus, not intractable Assessment & Plan: Trial on Nurtec 75 mg once a day PRN Advise methods include deep breathing, cognitive behavioral therapy focusing on changing thoughts, Limit or avoid alcohol, caffeine, chocolate, canner foods, MSG and aspartame.  Implement sleep hygiene includes not watching TV or listen music in bed, don't eat heavy meals within a  couple of hours of bedtime, don't use your phone, laptop, or tablet at bedtime    Orders: -     Nurtec; Take 1 tablet (75 mg total) by mouth once as needed for up to 1 dose.  Dispense: 8 tablet; Refill: 1    Return in about 1 week (around 04/16/2023) for medication managment and unintentionally weight loss with abdominal pain.   Cruzita Lederer Newman Nip, FNP

## 2023-04-09 NOTE — Patient Instructions (Signed)

## 2023-04-12 LAB — CBC WITH DIFFERENTIAL/PLATELET
Basophils Absolute: 0 10*3/uL (ref 0.0–0.2)
Basos: 1 %
EOS (ABSOLUTE): 0.1 10*3/uL (ref 0.0–0.4)
Eos: 2 %
Hematocrit: 38.8 % (ref 34.0–46.6)
Hemoglobin: 12.8 g/dL (ref 11.1–15.9)
Immature Grans (Abs): 0 10*3/uL (ref 0.0–0.1)
Immature Granulocytes: 0 %
Lymphocytes Absolute: 2 10*3/uL (ref 0.7–3.1)
Lymphs: 40 %
MCH: 30.8 pg (ref 26.6–33.0)
MCHC: 33 g/dL (ref 31.5–35.7)
MCV: 94 fL (ref 79–97)
Monocytes Absolute: 0.4 10*3/uL (ref 0.1–0.9)
Monocytes: 7 %
Neutrophils Absolute: 2.6 10*3/uL (ref 1.4–7.0)
Neutrophils: 50 %
Platelets: 363 10*3/uL (ref 150–450)
RBC: 4.15 x10E6/uL (ref 3.77–5.28)
RDW: 12.2 % (ref 11.7–15.4)
WBC: 5.1 10*3/uL (ref 3.4–10.8)

## 2023-04-12 LAB — LIPID PANEL
Chol/HDL Ratio: 4.5 ratio — ABNORMAL HIGH (ref 0.0–4.4)
Cholesterol, Total: 245 mg/dL — ABNORMAL HIGH (ref 100–199)
HDL: 54 mg/dL (ref >39)
LDL Chol Calc (NIH): 168 mg/dL — ABNORMAL HIGH (ref 0–99)
Triglycerides: 127 mg/dL (ref 0–149)
VLDL Cholesterol Cal: 23 mg/dL (ref 5–40)

## 2023-04-12 LAB — HEMOGLOBIN A1C
Est. average glucose Bld gHb Est-mCnc: 180 mg/dL
Hgb A1c MFr Bld: 7.9 % — ABNORMAL HIGH (ref 4.8–5.6)

## 2023-04-12 LAB — MICROALBUMIN / CREATININE URINE RATIO
Creatinine, Urine: 199.1 mg/dL
Microalb/Creat Ratio: 9 mg/g creat (ref 0–29)
Microalbumin, Urine: 18.5 ug/mL

## 2023-04-12 LAB — CMP14+EGFR
ALT: 20 IU/L (ref 0–32)
AST: 18 IU/L (ref 0–40)
Albumin/Globulin Ratio: 1.5 (ref 1.2–2.2)
Albumin: 4.2 g/dL (ref 3.8–4.9)
Alkaline Phosphatase: 44 IU/L (ref 44–121)
BUN/Creatinine Ratio: 15 (ref 9–23)
BUN: 15 mg/dL (ref 6–24)
Bilirubin Total: 0.9 mg/dL (ref 0.0–1.2)
CO2: 21 mmol/L (ref 20–29)
Calcium: 9.7 mg/dL (ref 8.7–10.2)
Chloride: 105 mmol/L (ref 96–106)
Creatinine, Ser: 1.02 mg/dL — ABNORMAL HIGH (ref 0.57–1.00)
Globulin, Total: 2.8 g/dL (ref 1.5–4.5)
Glucose: 183 mg/dL — ABNORMAL HIGH (ref 70–99)
Potassium: 3.7 mmol/L (ref 3.5–5.2)
Sodium: 141 mmol/L (ref 134–144)
Total Protein: 7 g/dL (ref 6.0–8.5)
eGFR: 64 mL/min/{1.73_m2} (ref >59)

## 2023-04-12 LAB — VITAMIN D 25 HYDROXY (VIT D DEFICIENCY, FRACTURES): Vit D, 25-Hydroxy: 11.4 ng/mL — ABNORMAL LOW (ref 30.0–100.0)

## 2023-04-12 LAB — TSH+FREE T4
Free T4: 1.18 ng/dL (ref 0.82–1.77)
TSH: 1.03 u[IU]/mL (ref 0.450–4.500)

## 2023-04-13 ENCOUNTER — Encounter: Payer: Self-pay | Admitting: *Deleted

## 2023-04-13 ENCOUNTER — Other Ambulatory Visit: Payer: Self-pay | Admitting: Family Medicine

## 2023-04-16 ENCOUNTER — Encounter: Payer: Self-pay | Admitting: Family Medicine

## 2023-04-16 ENCOUNTER — Ambulatory Visit: Payer: Medicaid Other | Admitting: Family Medicine

## 2023-04-16 VITALS — BP 121/81 | HR 123 | Ht 61.0 in | Wt 166.0 lb

## 2023-04-16 DIAGNOSIS — L910 Hypertrophic scar: Secondary | ICD-10-CM

## 2023-04-16 DIAGNOSIS — R634 Abnormal weight loss: Secondary | ICD-10-CM

## 2023-04-16 DIAGNOSIS — M069 Rheumatoid arthritis, unspecified: Secondary | ICD-10-CM

## 2023-04-16 DIAGNOSIS — M79642 Pain in left hand: Secondary | ICD-10-CM

## 2023-04-16 DIAGNOSIS — Z135 Encounter for screening for eye and ear disorders: Secondary | ICD-10-CM

## 2023-04-16 MED ORDER — NURTEC 75 MG PO TBDP: 1.0000 | ORAL_TABLET | Freq: Once | ORAL | 1 refills | Status: DC | PRN

## 2023-04-16 MED ORDER — VITAMIN D3 50 MCG (2000 UT) PO CAPS: 2000.0000 [IU] | ORAL_CAPSULE | Freq: Every day | ORAL | 1 refills | Status: DC

## 2023-04-16 MED ORDER — CLOBETASOL PROPIONATE 0.05 % EX CREA
1.0000 | TOPICAL_CREAM | Freq: Two times a day (BID) | CUTANEOUS | 0 refills | Status: AC
Start: 2023-04-16 — End: ?

## 2023-04-16 NOTE — Patient Instructions (Signed)

## 2023-04-16 NOTE — Progress Notes (Signed)
Patient Office Visit   Subjective   Patient ID: Kayla Choi, female    DOB: 1966/05/23  Age: 57 y.o. MRN: [id number]  CC:  Chief Complaint  Patient presents with   Follow-up    Patient is here for f/u of medication management and unintentional weight loss. States she still has no appetite.     HPI Kayla Choi 57 year old female, presents to the clinical for unintentional weight loss and left hand pain. She  has a past medical history of Acid reflux, ADD (attention deficit disorder), Anemia, Anxiety, Asthma, Balance problem, Chest pain, COPD (chronic obstructive pulmonary disease) (HCC), Depression, Diabetes mellitus, Glaucoma, Hyperlipidemia, Hypertension, Normal cardiac stress test (12/2015), Panic attack, Prediabetes, Recurrent falls, and Social phobia.  Hand Pain  There was no injury mechanism. The pain is present in the left wrist and left hand. The quality of the pain is described as aching. The pain is at a severity of 8/10. The pain has been Constant since the incident. Associated symptoms include muscle weakness and numbness. Pertinent negatives include no chest pain. The symptoms are aggravated by movement. She has tried acetaminophen for the symptoms. The treatment provided no relief.      Outpatient Encounter Medications as of 04/16/2023  Medication Sig   Accu-Chek Softclix Lancets lancets SMARTSIG:2 Topical Twice Daily   albuterol (PROVENTIL) (2.5 MG/3ML) 0.083% nebulizer solution Take 3 mLs (2.5 mg total) by nebulization every 4 (four) hours as needed for wheezing or shortness of breath.   albuterol (VENTOLIN HFA) 108 (90 Base) MCG/ACT inhaler Inhale 2 puffs into the lungs every 4 (four) hours as needed for wheezing or shortness of breath.   amLODipine (NORVASC) 5 MG tablet Take 1 tablet (5 mg total) by mouth daily.   Cholecalciferol (VITAMIN D3) 50 MCG (2000 UT) capsule Take 1 capsule (2,000 Units total) by mouth daily.   clobetasol cream (TEMOVATE) 0.05 % Apply 1  Application topically 2 (two) times daily.   gabapentin (NEURONTIN) 300 MG capsule Take 300 mg by mouth 3 (three) times daily.   HUMULIN R 100 UNIT/ML injection Inject into the skin. Inject 11 units before breakfast and 5 units before lunch and dinner.   LEVEMIR FLEXPEN 100 UNIT/ML FlexPen SMARTSIG:12 Unit(s) SUB-Q Every Night   lisinopril-hydrochlorothiazide (ZESTORETIC) 20-12.5 MG tablet TAKE ONE TABLET BY MOUTH ONCE DAILY.   Rimegepant Sulfate (NURTEC) 75 MG TBDP Take 1 tablet (75 mg total) by mouth once as needed for up to 1 dose.   rosuvastatin (CRESTOR) 40 MG tablet Take 1 tablet (40 mg total) by mouth at bedtime.   SURE COMFORT INSULIN SYRINGE 31G X 5/16" 0.5 ML MISC SMARTSIG:Injection As Directed   SURE COMFORT PEN NEEDLES 32G X 4 MM MISC    Tiotropium Bromide Monohydrate (SPIRIVA RESPIMAT) 1.25 MCG/ACT AERS Inhale 2 puffs into the lungs daily.   [DISCONTINUED] acetaminophen (TYLENOL) 325 MG tablet Take 325 mg by mouth every 6 (six) hours as needed for moderate pain or headache.    [DISCONTINUED] atorvastatin (LIPITOR) 20 MG tablet Take by mouth.   [DISCONTINUED] budesonide (PULMICORT) 0.25 MG/2ML nebulizer solution Inhale into the lungs.   [DISCONTINUED] buPROPion (WELLBUTRIN XL) 300 MG 24 hr tablet Take by mouth.   [DISCONTINUED] dapagliflozin propanediol (FARXIGA) 10 MG TABS tablet Take 10 mg by mouth daily before breakfast.   [DISCONTINUED] glipiZIDE (GLUCOTROL) 10 MG tablet Take 1 tablet (10 mg total) by mouth 2 (two) times daily with a meal.   [DISCONTINUED] HYDROcodone-acetaminophen (NORCO/VICODIN) 5-325 MG tablet Take 1 tablet  by mouth every 4 (four) hours as needed.   [DISCONTINUED] loratadine (CLARITIN) 10 MG tablet Take 1 tablet (10 mg total) by mouth daily.   [DISCONTINUED] pioglitazone (ACTOS) 45 MG tablet Take 45 mg by mouth daily.   [DISCONTINUED] Rimegepant Sulfate (NURTEC) 75 MG TBDP Take 1 tablet (75 mg total) by mouth once as needed for up to 1 dose.   [DISCONTINUED]  topiramate (TOPAMAX) 25 MG tablet Take 1 tablet (25 mg total) by mouth 2 (two) times daily.   No facility-administered encounter medications on file as of 04/16/2023.    Past Surgical History:  Procedure Laterality Date   ABDOMINAL HYSTERECTOMY     fibroids, both ovaries left intact   ANKLE SURGERY     CHOLECYSTECTOMY     COLONOSCOPY N/A 10/18/2019   Procedure: COLONOSCOPY;  Surgeon: Corbin Ade, MD;  Location: [facility];  Service: Endoscopy;  Laterality: N/A;  1:00-office rescheduled to 11/10 @ 12:45pm   COSMETIC SURGERY     elbow   FRACTURE SURGERY     Recurrent elbow surgery   LIPOMA EXCISION  04/30/2012   Procedure: EXCISION LIPOMA;  Surgeon: Fabio Bering, MD;  Location: [facility];  Service: General;  Laterality: Right;  Excision soft tissue mass right thigh   MASS EXCISION Right 12/19/2020   Procedure: EXCISION CYST; 2 CM; AXILLA;  Surgeon: Lucretia Roers, MD;  Location: [facility];  Service: General;  Laterality: Right;   ORIF ANKLE FRACTURE Right 12/15/2017   Procedure: OPEN REDUCTION INTERNAL FIXATION (ORIF) RIGHT ANKLE FRACTURE;  Surgeon: Sheral Apley, MD;  Location: [facility];  Service: Orthopedics;  Laterality: Right;   POLYPECTOMY  10/18/2019   Procedure: POLYPECTOMY;  Surgeon: Corbin Ade, MD;  Location: [facility];  Service: Endoscopy;;  colon   right elbow     reconstruction    Review of Systems  Constitutional:  Negative for chills and fever.  Respiratory:  Negative for shortness of breath.   Cardiovascular:  Negative for chest pain.  Gastrointestinal:  Negative for nausea and vomiting.  Genitourinary:  Negative for dysuria.  Musculoskeletal:  Positive for joint pain and myalgias.  Neurological:  Positive for numbness. Negative for dizziness and headaches.      Objective    BP 121/81   Pulse (!) 123   Ht 5\' 1"  (1.549 m)   Wt 166 lb (75.3 kg)   SpO2 92%   BMI 31.37 kg/m   Physical Exam Vitals reviewed.  Constitutional:       General: She is not in acute distress.    Appearance: Normal appearance. She is not ill-appearing, toxic-appearing or diaphoretic.  HENT:     Head: Normocephalic.  Eyes:     General:        Right eye: No discharge.        Left eye: No discharge.     Conjunctiva/sclera: Conjunctivae normal.  Cardiovascular:     Rate and Rhythm: Normal rate.     Pulses: Normal pulses.     Heart sounds: Normal heart sounds.  Pulmonary:     Effort: Pulmonary effort is normal. No respiratory distress.     Breath sounds: Normal breath sounds.  Abdominal:     General: Bowel sounds are normal.     Palpations: Abdomen is soft.     Tenderness: There is no right CVA tenderness.  Musculoskeletal:        General: Normal range of motion.     Cervical back: Normal range  of motion.  Skin:    General: Skin is warm and dry.     Capillary Refill: Capillary refill takes less than 2 seconds.  Neurological:     General: No focal deficit present.     Mental Status: She is alert and oriented to person, place, and time.     Coordination: Coordination normal.     Gait: Gait normal.  Psychiatric:        Mood and Affect: Mood normal.        Behavior: Behavior normal.       Assessment & Plan:  Screening for diabetic retinopathy -     Ambulatory referral to Ophthalmology  Unintentional weight loss Assessment & Plan: Patient reported losing 30 lbs in 3 months unintentional with no appetite. CT of abdomen pelvis ordered- awaiting results will follow up  Colonoscopy appointment pending  Explained to patient Labs ordered for Vit b12 deficiency Patient already has low Vit D levels which may cause loss of appetite. Vit D 50 mcg capsule taken daily sent to pharmacy   Orders: -     CT ABDOMEN PELVIS WO CONTRAST; Future -     Vitamin B12  Rheumatoid arthritis involving both hands, unspecified whether rheumatoid factor present (HCC) -     Anti-CCP Ab, IgG + IgA (RDL)  Keloid scar -     Clobetasol Propionate;  Apply 1 Application topically 2 (two) times daily.  Dispense: 30 g; Refill: 0  Left hand pain Assessment & Plan: Ruling out RA - Anti-CP labs ordered. Explained to patient Non pharmacological interventions include the use of ice or heat, rest, recommend range of motion exercises. The use of NSAIDs for pain management. Follow up for worsening or persistent symptoms. Patient verbalizes understanding regarding plan of care and all questions answered.    Other orders -     Nurtec; Take 1 tablet (75 mg total) by mouth once as needed for up to 1 dose.  Dispense: 8 tablet; Refill: 1 -     Vitamin D3; Take 1 capsule (2,000 Units total) by mouth daily.  Dispense: 90 capsule; Refill: 1    Return in about 3 months (around 07/17/2023) for chronic follow-up, diabetes, routine labs, hyperlipidemia/ high cholestrol.   Cruzita Lederer Newman Nip, FNP

## 2023-04-16 NOTE — Assessment & Plan Note (Addendum)
Patient reported losing 30 lbs in 3 months unintentional with no appetite. CT of abdomen pelvis ordered- awaiting results will follow up  Colonoscopy appointment pending  Explained to patient Labs ordered for Vit b12 deficiency Patient already has low Vit D levels which may cause loss of appetite. Vit D 50 mcg capsule taken daily sent to pharmacy

## 2023-04-16 NOTE — Assessment & Plan Note (Signed)
Ruling out RA - Anti-CP labs ordered. Explained to patient Non pharmacological interventions include the use of ice or heat, rest, recommend range of motion exercises. The use of NSAIDs for pain management. Follow up for worsening or persistent symptoms. Patient verbalizes understanding regarding plan of care and all questions answered.

## 2023-04-17 ENCOUNTER — Encounter: Payer: Self-pay | Admitting: Family Medicine

## 2023-04-22 LAB — ANTI-CCP AB, IGG + IGA (RDL): Anti-CCP Ab, IgG + IgA (RDL): <20 Units (ref <20)

## 2023-04-24 ENCOUNTER — Other Ambulatory Visit: Payer: Self-pay | Admitting: Family Medicine

## 2023-04-24 DIAGNOSIS — R634 Abnormal weight loss: Secondary | ICD-10-CM

## 2023-04-24 NOTE — Addendum Note (Signed)
Addended by: DEL ORBE POLANCO, Margarett Viti on: 04/24/2023 05:33 PM   Modules accepted: Level of Service  

## 2023-04-24 NOTE — Addendum Note (Signed)
Addended by: Rica Records on: 04/24/2023 05:38 PM   Modules accepted: Orders

## 2023-04-24 NOTE — Progress Notes (Signed)
Kindly inform the patient that heart monitoring study did not show evidence of atrial fibrillation or any significant worrisome cardiac arrhythmia

## 2023-04-28 ENCOUNTER — Telehealth: Payer: Self-pay | Admitting: Family Medicine

## 2023-04-28 NOTE — Telephone Encounter (Signed)
Spoke with patient. Kayla Choi has the paperwork and will have it completed by next Tuesday, 5/28.

## 2023-04-28 NOTE — Telephone Encounter (Signed)
Pt called and is requesting paperwork, we don't have a copy at the front desk, so I'm not sure what paperwork she is implying to. She would like a call back today in regards.

## 2023-05-06 ENCOUNTER — Encounter (HOSPITAL_COMMUNITY): Payer: Self-pay | Admitting: Emergency Medicine

## 2023-05-06 ENCOUNTER — Encounter: Payer: Self-pay | Admitting: Gastroenterology

## 2023-05-06 ENCOUNTER — Emergency Department (HOSPITAL_COMMUNITY): Payer: Medicaid Other

## 2023-05-06 ENCOUNTER — Emergency Department (HOSPITAL_COMMUNITY)
Admission: EM | Admit: 2023-05-06 | Discharge: 2023-05-06 | Disposition: A | Discharge status: Home / Self Care | Payer: Medicaid Other | Attending: Emergency Medicine | Admitting: Emergency Medicine

## 2023-05-06 ENCOUNTER — Ambulatory Visit (INDEPENDENT_AMBULATORY_CARE_PROVIDER_SITE_OTHER): Payer: Medicaid Other | Admitting: Gastroenterology

## 2023-05-06 ENCOUNTER — Emergency Department (HOSPITAL_COMMUNITY)
Admission: EM | Admit: 2023-05-06 | Discharge: 2023-05-07 | Disposition: A | Discharge status: Home / Self Care | Payer: Medicaid Other | Source: Home / Self Care | Attending: Emergency Medicine | Admitting: Emergency Medicine

## 2023-05-06 ENCOUNTER — Other Ambulatory Visit: Payer: Self-pay

## 2023-05-06 VITALS — BP 92/58 | HR 108 | Temp 97.4°F | Ht 61.0 in | Wt 167.0 lb

## 2023-05-06 DIAGNOSIS — Z1211 Encounter for screening for malignant neoplasm of colon: Secondary | ICD-10-CM

## 2023-05-06 DIAGNOSIS — Z7901 Long term (current) use of anticoagulants: Secondary | ICD-10-CM | POA: Insufficient documentation

## 2023-05-06 DIAGNOSIS — E119 Type 2 diabetes mellitus without complications: Secondary | ICD-10-CM | POA: Insufficient documentation

## 2023-05-06 DIAGNOSIS — I10 Essential (primary) hypertension: Secondary | ICD-10-CM | POA: Insufficient documentation

## 2023-05-06 DIAGNOSIS — R42 Dizziness and giddiness: Secondary | ICD-10-CM | POA: Diagnosis present

## 2023-05-06 DIAGNOSIS — N3000 Acute cystitis without hematuria: Secondary | ICD-10-CM | POA: Insufficient documentation

## 2023-05-06 DIAGNOSIS — Z91040 Latex allergy status: Secondary | ICD-10-CM | POA: Insufficient documentation

## 2023-05-06 DIAGNOSIS — Z79899 Other long term (current) drug therapy: Secondary | ICD-10-CM | POA: Insufficient documentation

## 2023-05-06 DIAGNOSIS — Z794 Long term (current) use of insulin: Secondary | ICD-10-CM | POA: Insufficient documentation

## 2023-05-06 DIAGNOSIS — I959 Hypotension, unspecified: Secondary | ICD-10-CM | POA: Diagnosis not present

## 2023-05-06 DIAGNOSIS — H538 Other visual disturbances: Secondary | ICD-10-CM | POA: Insufficient documentation

## 2023-05-06 DIAGNOSIS — J45909 Unspecified asthma, uncomplicated: Secondary | ICD-10-CM | POA: Insufficient documentation

## 2023-05-06 DIAGNOSIS — E860 Dehydration: Secondary | ICD-10-CM | POA: Insufficient documentation

## 2023-05-06 DIAGNOSIS — Z7902 Long term (current) use of antithrombotics/antiplatelets: Secondary | ICD-10-CM | POA: Insufficient documentation

## 2023-05-06 LAB — PROTIME-INR
INR: 1 (ref 0.8–1.2)
Prothrombin Time: 12.9 seconds (ref 11.4–15.2)

## 2023-05-06 LAB — URINALYSIS, ROUTINE W REFLEX MICROSCOPIC
Bilirubin Urine: NEGATIVE
Glucose, UA: NEGATIVE mg/dL
Hgb urine dipstick: NEGATIVE
Ketones, ur: NEGATIVE mg/dL
Nitrite: NEGATIVE
Protein, ur: 30 mg/dL — AB
Specific Gravity, Urine: 1.021 (ref 1.005–1.030)
WBC, UA: >50 WBC/hpf (ref 0–5)
pH: 5 (ref 5.0–8.0)

## 2023-05-06 LAB — CBC
HCT: 37.8 % (ref 36.0–46.0)
HCT: 38.3 % (ref 36.0–46.0)
Hemoglobin: 12.2 g/dL (ref 12.0–15.0)
Hemoglobin: 12.3 g/dL (ref 12.0–15.0)
MCH: 30.4 pg (ref 26.0–34.0)
MCH: 30.6 pg (ref 26.0–34.0)
MCHC: 32.3 g/dL (ref 30.0–36.0)
MCHC: 32.1 g/dL (ref 30.0–36.0)
MCV: 94.3 fL (ref 80.0–100.0)
MCV: 95.3 fL (ref 80.0–100.0)
Platelets: 368 10*3/uL (ref 150–400)
Platelets: 370 10*3/uL (ref 150–400)
RBC: 4.01 MIL/uL (ref 3.87–5.11)
RBC: 4.02 MIL/uL (ref 3.87–5.11)
RDW: 12.2 % (ref 11.5–15.5)
RDW: 12.1 % (ref 11.5–15.5)
WBC: 4.6 10*3/uL (ref 4.0–10.5)
WBC: 6.8 10*3/uL (ref 4.0–10.5)
nRBC: 0 % (ref 0.0–0.2)
nRBC: 0 % (ref 0.0–0.2)

## 2023-05-06 LAB — BASIC METABOLIC PANEL
Anion gap: 16 — ABNORMAL HIGH (ref 5–15)
Anion gap: 11 (ref 5–15)
BUN: 24 mg/dL — ABNORMAL HIGH (ref 6–20)
BUN: 26 mg/dL — ABNORMAL HIGH (ref 6–20)
CO2: 23 mmol/L (ref 22–32)
CO2: 29 mmol/L (ref 22–32)
Calcium: 9.3 mg/dL (ref 8.9–10.3)
Calcium: 9.2 mg/dL (ref 8.9–10.3)
Chloride: 98 mmol/L (ref 98–111)
Chloride: 99 mmol/L (ref 98–111)
Creatinine, Ser: 1.29 mg/dL — ABNORMAL HIGH (ref 0.44–1.00)
Creatinine, Ser: 1.31 mg/dL — ABNORMAL HIGH (ref 0.44–1.00)
GFR, Estimated: 48 mL/min — ABNORMAL LOW (ref >60)
GFR, Estimated: 48 mL/min — ABNORMAL LOW (ref >60)
Glucose, Bld: 136 mg/dL — ABNORMAL HIGH (ref 70–99)
Glucose, Bld: 210 mg/dL — ABNORMAL HIGH (ref 70–99)
Potassium: 3.6 mmol/L (ref 3.5–5.1)
Potassium: 3.6 mmol/L (ref 3.5–5.1)
Sodium: 137 mmol/L (ref 135–145)
Sodium: 139 mmol/L (ref 135–145)

## 2023-05-06 LAB — RAPID URINE DRUG SCREEN, HOSP PERFORMED
Amphetamines: NOT DETECTED
Barbiturates: NOT DETECTED
Benzodiazepines: NOT DETECTED
Cocaine: NOT DETECTED
Opiates: NOT DETECTED
Tetrahydrocannabinol: NOT DETECTED

## 2023-05-06 LAB — MAGNESIUM: Magnesium: 2.3 mg/dL (ref 1.7–2.4)

## 2023-05-06 LAB — CBG MONITORING, ED: Glucose-Capillary: 171 mg/dL — ABNORMAL HIGH (ref 70–99)

## 2023-05-06 LAB — LACTIC ACID, PLASMA: Lactic Acid, Venous: 0.8 mmol/L (ref 0.5–1.9)

## 2023-05-06 LAB — TROPONIN I (HIGH SENSITIVITY): Troponin I (High Sensitivity): <2 ng/L (ref <18)

## 2023-05-06 MED ORDER — SODIUM CHLORIDE 0.9 % IV BOLUS
1000.0000 mL | Freq: Once | INTRAVENOUS | Status: AC
  Administered 2023-05-06: 1000 mL via INTRAVENOUS

## 2023-05-06 MED ORDER — SODIUM CHLORIDE 0.9 % IV SOLN
1.0000 g | Freq: Once | INTRAVENOUS | Status: AC
  Administered 2023-05-06: 1 g via INTRAVENOUS
  Filled 2023-05-06: qty 10

## 2023-05-06 NOTE — ED Triage Notes (Signed)
Pt was here this morning but LWBS. Returned with same symptoms including dizziness, headache, lightheadedness.

## 2023-05-06 NOTE — ED Triage Notes (Signed)
Pt sent from GI doctor for low BP in office this morning, 92/58. Pt states her BP is usually elevated. Pt reports headache, dizziness, lightheadedness.

## 2023-05-06 NOTE — ED Provider Triage Note (Signed)
Emergency Medicine Provider Triage Evaluation Note  Kayla Choi , a 57 y.o. female  was evaluated in triage.  Pt left after her previous triage evaluation.  Returns for continued dizziness, headache, and feeling "lightheaded."  She was seen at her [facility] office this morning and sent here for evaluation of hypotension.  States she normally has high blood pressure.  Symptoms have continued and now complains of left facial tingling and numbness.  History of stroke in December 2023.  Patient does take clopidogrel daily as any missed doses.  Has history migraine aches, takes Nurtec.  Patient has been experiencing facial numbness and tingling previously, neurology aware.  Review of Systems  Positive: Headache, lightheadedness, dizziness, numbness and tingling left face Negative: Nausea, vomiting, neck pain or stiffness  Physical Exam  BP (!) 106/93 (BP Location: Right Arm)   Pulse 99   Temp 98.1 F (36.7 C) (Oral)   Resp 18   Ht 5\' 1"  (1.549 m)   Wt 75.8 kg   SpO2 100%   BMI 31.55 kg/m  Gen:   Awake, no distress   Resp:  Normal effort  MSK:   Moves extremities without difficulty  Other:    Medical Decision Making  Medically screening exam initiated at 5:18 PM.  Appropriate orders placed.  Kayla Choi was informed that the remainder of the evaluation will be completed by another provider, this initial triage assessment does not replace that evaluation, and the importance of remaining in the ED until their evaluation is complete.     Pauline Aus, PA-C 05/06/23 1737

## 2023-05-06 NOTE — ED Provider Triage Note (Signed)
Emergency Medicine Provider Triage Evaluation Note  Kayla Choi , a 57 y.o. female  was evaluated in triage.  Pt complains of frontal headache, generalized weakness, nausea.  She was at her GI provider's office this morning was found to have a low blood pressure.  Pressure was 92/58 in their office and she was sent here for further evaluation.  She states that she does take antihypertensive medications but has not taken them today.  Headache was of gradual onset since yesterday.  1 episode of vomiting yesterday.  Nausea today.  Denies any chest pain, shortness of breath fever or chills.  Review of Systems  Positive: Headache, generalized weakness, low blood pressure Negative: Chest pain, shortness of breath, fever or chills  Physical Exam  BP 128/70 (BP Location: Right Arm)   Pulse 80   Temp 97.7 F (36.5 C)   Resp 18   Ht 5\' 1"  (1.549 m)   Wt 75.8 kg   SpO2 100%   BMI 31.55 kg/m  Gen:   Awake, no distress   Resp:  Normal effort  MSK:   Moves extremities without difficulty  Other:    Medical Decision Making  Medically screening exam initiated at 11:40 AM.  Appropriate orders placed.  Darcey Deer was informed that the remainder of the evaluation will be completed by another provider, this initial triage assessment does not replace that evaluation, and the importance of remaining in the ED until their evaluation is complete.     Pauline Aus, PA-C 05/06/23 1142

## 2023-05-06 NOTE — Patient Instructions (Signed)
Recommending you go to the ER for evaluation of significant change in blood pressure, low blood pressure, vision changes and lightheadedness. Once you have been evaluated and blood pressure improves, we will schedule you for a colonoscopy. I would encourage you to try and eat even if you don't have taste. Eat to maintain nutrition.

## 2023-05-06 NOTE — Progress Notes (Signed)
GI Office Note    Referring Provider: Wylene Men* Primary Care Physician:  Rica Records, FNP  Primary Gastroenterologist: Roetta Sessions, MD   Chief Complaint   Chief Complaint  Patient presents with   Colonoscopy     History of Present Illness   Kayla Choi is a 57 y.o. female presenting today to schedule surveillance colonoscopy. She has history of numerous adenomatous colon polyps on colonoscopy in 10/2019 and is due for 3 year follow up.   Patient reports not feeling well. She had a stroke in 11/2022. She completed rehab a couple of months ago, had been going to OT for left hand numbness and weakness since her stroke. Patient also notes blurred vision with stroke. She passed speech therapy evaluation during her admission in 11/2022. She notes since her stroke she has had no appetite. She does not have taste for anything and food has no taste. Over the last two days, she has had minimum oral intake. She had some fruit and water. Last normal meal on Sunday.   She has noted ongoing intermittent blurred vision. She has had headache, dizziness the last two days. Left arm tingling over the past couple of weeks. No n/v. Some intermittent upper abdominal pain, not often. BM every 2-3 days, used to be 2-3 times per day. Stools soft. No melena, brbpr. No heartburn, dysphagia.   Her blood pressure is usually high. Today presents with low blood pressure. Has not taken any blood pressure medication today. Has no means of checking her BP at home.   Weight down 10 pounds since 11/2022.  Colonoscopy 10/2019: -four 4-66mm polyps removed, tubular adenomas -next colonoscopy in 3 years  Medications   Current Outpatient Medications  Medication Sig Dispense Refill   Accu-Chek Softclix Lancets lancets SMARTSIG:2 Topical Twice Daily     albuterol (PROVENTIL) (2.5 MG/3ML) 0.083% nebulizer solution Take 3 mLs (2.5 mg total) by nebulization every 4 (four) hours as  needed for wheezing or shortness of breath. 75 mL 2   albuterol (VENTOLIN HFA) 108 (90 Base) MCG/ACT inhaler Inhale 2 puffs into the lungs every 4 (four) hours as needed for wheezing or shortness of breath. 18 g 2   amLODipine (NORVASC) 10 MG tablet Take 10 mg by mouth daily.     Cholecalciferol (VITAMIN D3) 50 MCG (2000 UT) capsule Take 1 capsule (2,000 Units total) by mouth daily. 90 capsule 1   clobetasol cream (TEMOVATE) 0.05 % Apply 1 Application topically 2 (two) times daily. 30 g 0   clopidogrel (PLAVIX) 75 MG tablet Take 75 mg by mouth daily.     gabapentin (NEURONTIN) 300 MG capsule Take 300 mg by mouth 3 (three) times daily.     HUMULIN R 100 UNIT/ML injection Inject into the skin. Inject 11 units before breakfast and 5 units before lunch and dinner.     LEVEMIR FLEXPEN 100 UNIT/ML FlexPen SMARTSIG:12 Unit(s) SUB-Q Every Night     lisinopril-hydrochlorothiazide (ZESTORETIC) 20-12.5 MG tablet TAKE ONE TABLET BY MOUTH ONCE DAILY. 90 tablet 3   Rimegepant Sulfate (NURTEC) 75 MG TBDP Take 1 tablet (75 mg total) by mouth once as needed for up to 1 dose. 8 tablet 1   rosuvastatin (CRESTOR) 40 MG tablet Take 1 tablet (40 mg total) by mouth at bedtime. 30 tablet 3   SURE COMFORT INSULIN SYRINGE 31G X 5/16" 0.5 ML MISC SMARTSIG:Injection As Directed     SURE COMFORT PEN NEEDLES 32G X 4 MM MISC  Tiotropium Bromide Monohydrate (SPIRIVA RESPIMAT) 1.25 MCG/ACT AERS Inhale 2 puffs into the lungs daily. 4 g 0   No current facility-administered medications for this visit.    Allergies   Allergies as of 05/06/2023 - Review Complete 05/06/2023  Allergen Reaction Noted   Aspirin Shortness Of Breath and Itching 09/03/2011   Metformin and related Anaphylaxis 04/02/2012   Other Anaphylaxis 04/15/2012   Oxycodone Itching 06/25/2014   Diphenhydramine hcl Rash 03/28/2014   Latex Itching and Rash 01/29/2007   Neosporin [neomycin-bacitracin zn-polymyx] Swelling, Rash, and Other (See Comments)  04/15/2012    Past Medical History   Past Medical History:  Diagnosis Date   Acid reflux    ADD (attention deficit disorder)    Anemia    iron deficinecy   Anxiety    with social phobia   Asthma    Balance problem    Chest pain    that occurs with anxiety   COPD (chronic obstructive pulmonary disease) (HCC)    Depression    Diabetes mellitus    Glaucoma    Hyperlipidemia    Hypertension    Normal cardiac stress test 12/2015   low risk study   Panic attack    Prediabetes    Recurrent falls    Social phobia     Past Surgical History   Past Surgical History:  Procedure Laterality Date   ABDOMINAL HYSTERECTOMY     fibroids, both ovaries left intact   ANKLE SURGERY     CHOLECYSTECTOMY     COLONOSCOPY N/A 10/18/2019   Procedure: COLONOSCOPY;  Surgeon: Corbin Ade, MD;  Location: [facility];  Service: Endoscopy;  Laterality: N/A;  1:00-office rescheduled to 11/10 @ 12:45pm   COSMETIC SURGERY     elbow   FRACTURE SURGERY     Recurrent elbow surgery   LIPOMA EXCISION  04/30/2012   Procedure: EXCISION LIPOMA;  Surgeon: Fabio Bering, MD;  Location: [facility];  Service: General;  Laterality: Right;  Excision soft tissue mass right thigh   MASS EXCISION Right 12/19/2020   Procedure: EXCISION CYST; 2 CM; AXILLA;  Surgeon: Lucretia Roers, MD;  Location: [facility];  Service: General;  Laterality: Right;   ORIF ANKLE FRACTURE Right 12/15/2017   Procedure: OPEN REDUCTION INTERNAL FIXATION (ORIF) RIGHT ANKLE FRACTURE;  Surgeon: Sheral Apley, MD;  Location: [facility];  Service: Orthopedics;  Laterality: Right;   POLYPECTOMY  10/18/2019   Procedure: POLYPECTOMY;  Surgeon: Corbin Ade, MD;  Location: [facility];  Service: Endoscopy;;  colon   right elbow     reconstruction    Past Family History   Family History  Problem Relation Age of Onset   Hypertension Mother    Diabetes Father    Heart disease Father    Anesthesia problems Neg Hx     Malignant hyperthermia Neg Hx    Hypotension Neg Hx    Pseudochol deficiency Neg Hx     Past Social History   Social History   Socioeconomic History   Marital status: Single    Spouse name: Not on file   Number of children: Not on file   Years of education: Not on file   Highest education level: Not on file  Occupational History   Not on file  Tobacco Use   Smoking status: Never   Smokeless tobacco: Never  Vaping Use   Vaping Use: Never used  Substance and Sexual Activity   Alcohol use: No  Alcohol/week: 0.0 standard drinks of alcohol   Drug use: No   Sexual activity: Yes    Birth control/protection: Surgical  Other Topics Concern   Not on file  Social History Narrative   Not on file   Social Determinants of Health   Financial Resource Strain: Not on file  Food Insecurity: No Food Insecurity (12/02/2022)   Hunger Vital Sign    Worried About Running Out of Food in the Last Year: Never true    Ran Out of Food in the Last Year: Never true  Transportation Needs: No Transportation Needs (12/02/2022)   PRAPARE - Administrator, Civil Service (Medical): No    Lack of Transportation (Non-Medical): No  Physical Activity: Not on file  Stress: Not on file  Social Connections: Not on file  Intimate Partner Violence: Not At Risk (12/02/2022)   Humiliation, Afraid, Rape, and Kick questionnaire    Fear of Current or Ex-Partner: No    Emotionally Abused: No    Physically Abused: No    Sexually Abused: No    Review of Systems   General: Negative for anorexia, weight loss, fever, chills, fatigue, +weakness. Eyes: blurred vision ENT: Negative for hoarseness, difficulty swallowing , nasal congestion. CV: Negative for chest pain, angina, palpitations, dyspnea on exertion, peripheral edema.  Respiratory: Negative for dyspnea at rest, dyspnea on exertion, cough, sputum, wheezing.  GI: See history of present illness. GU:  Negative for dysuria, hematuria, urinary  incontinence, urinary frequency, nocturnal urination.  MS: Negative for joint pain, low back pain.  Derm: Negative for rash or itching.  Neuro: Negative for seizure, frequent headaches, memory loss,  confusion. Left arm numbness, weakness, tingling Psych: Negative for anxiety, depression, suicidal ideation, hallucinations.  Endo: Negative for unusual weight change.  Heme: Negative for bruising or bleeding. Allergy: Negative for rash or hives.  Physical Exam   BP (!) 92/58 (BP Location: Right Arm, Patient Position: Sitting, Cuff Size: Large)   Pulse (!) 108   Temp (!) 97.4 F (36.3 C) (Oral)   Ht 5\' 1"  (1.549 m)   Wt 167 lb (75.8 kg)   SpO2 98%   BMI 31.55 kg/m    General: Well-nourished, well-developed in no acute distress.  Head: Normocephalic, atraumatic.   Eyes: Conjunctiva pink, no icterus. Mouth: Oropharyngeal mucosa moist and pink   Neck: Supple without thyromegaly, masses, or lymphadenopathy.  Lungs: Clear to auscultation bilaterally.  Heart: Regular rate and rhythm, no murmurs rubs or gallops.  Abdomen: Bowel sounds are normal, nondistended, no hepatosplenomegaly or masses, no abdominal bruits or hernia, no rebound or guarding.  Mild epigastric tenderness. Rectal: not performed Extremities: No lower extremity edema. No clubbing or deformities.  Neuro: Alert and oriented x 4.  Skin: Warm and dry, no rash or jaundice.   Psych: Alert and cooperative, normal mood and affect.  Labs   Lab Results  Component Value Date   CREATININE 1.02 (H) 04/10/2023   BUN 15 04/10/2023   NA 141 04/10/2023   K 3.7 04/10/2023   CL 105 04/10/2023   CO2 21 04/10/2023   Lab Results  Component Value Date   WBC 5.1 04/10/2023   HGB 12.8 04/10/2023   HCT 38.8 04/10/2023   MCV 94 04/10/2023   PLT 363 04/10/2023   Lab Results  Component Value Date   ALT 20 04/10/2023   AST 18 04/10/2023   ALKPHOS 44 04/10/2023   BILITOT 0.9 04/10/2023   Lab Results  Component Value Date  HGBA1C 7.9 (H) 04/10/2023   Lab Results  Component Value Date   TSH 1.030 04/10/2023    Imaging Studies   CARDIAC EVENT MONITOR  Result Date: 04/12/2023 The basic rhythm is normal sinus with an average HR of 89 bpm No atrial fibrillation or flutter No high-grade heart block or pathologic pauses There are rare supraventricular beats without sustained arrhythmias No significant arrhythmia    Assessment   Hypotension: associated with lightheadedness, acute worsening of blurred vision, ongoing left arm tingling. Etiology unclear. She has not taken her BP today. Last recorded BP 04/16/23 121/81. Her BP typically in the 140-150s systolic or higher. I have discussed with her that it would be wise to go to the ED for evaluation for symptomatic hypotension. Will likely require labs, fluids (her symptoms could be due to dehydration, possibly rule out stroke).   Weight loss: approximately 10-20 pounds documented. She reports poor appetite since stroke. Decreased taste. Encouraged her to "eat to live". She denied postprandial abdominal pain or nausea. No heartburn. ?related to stroke or depression. PCP has ordered abdominal u/s.  H/o colon polyps: due for surveillance study, will schedule after acute symptoms evaluated.   PLAN   Go to ED for evaluation of hypotension, headache, lightheadedness, blurred vision.  Plan for colonoscopy later. ASA 3.  I have discussed the risks, alternatives, benefits with regards to but not limited to the risk of reaction to medication, bleeding, infection, perforation and the patient is agreeable to proceed. Written consent to be obtained. Will need additional bowel regimen.  Encouraged her to eat regularly even if she does not feel hungry or have taste. She needs to eat to maintain nutrition.    Leanna Battles. Melvyn Neth, MHS, PA-C [facility]

## 2023-05-07 ENCOUNTER — Encounter: Payer: Self-pay | Admitting: Gastroenterology

## 2023-05-07 MED ORDER — CEPHALEXIN 500 MG PO CAPS: 500.0000 mg | ORAL_CAPSULE | Freq: Two times a day (BID) | ORAL | 0 refills | Status: AC

## 2023-05-07 NOTE — ED Provider Notes (Signed)
[place] EMERGENCY DEPARTMENT AT [facility] Provider Note   CSN: [id number] Arrival date & time: 05/06/23  1601     History  Chief Complaint  Patient presents with   Dizziness    Kayla Choi is a 57 y.o. female with a prior history including hypertension, type 2 diabetes, asthma, hyperlipidemia and history of CVA which left her with numbness in her left face and hand but no weakness presenting for evaluation of lightheadedness and frontal headache, although headache is currently better.  She describes also having reduced taste sensation since her stroke in December which has diminished her appetite.  She has been drinking fluids, but states she mainly drinks iced tea since her stroke.  Reduced appetite for the past 2 days.  She was seen by gastroenterology today as a preprocedure visit for screening colonoscopy.  During that visit her blood pressure was found to be 92/58.  She was advised to come here for further evaluation.  Pt denies any new weakness, numbness, also no nausea or vomiting, abdominal pain, chest pain or shortness of breath.  No diarrhea.  The history is provided by the patient.       Home Medications Prior to Admission medications   Medication Sig Start Date End Date Taking? Authorizing Provider  albuterol (PROVENTIL) (2.5 MG/3ML) 0.083% nebulizer solution Take 3 mLs (2.5 mg total) by nebulization every 4 (four) hours as needed for wheezing or shortness of breath. 04/23/20  Yes Bates, Crystal A, FNP  albuterol (VENTOLIN HFA) 108 (90 Base) MCG/ACT inhaler Inhale 2 puffs into the lungs every 4 (four) hours as needed for wheezing or shortness of breath. 03/23/20  Yes [place], Velna Hatchet, MD  amLODipine (NORVASC) 10 MG tablet Take 10 mg by mouth daily. 04/28/23  Yes [provider]  cephALEXin (KEFLEX) 500 MG capsule Take 1 capsule (500 mg total) by mouth 2 (two) times daily for 7 days. 05/07/23 05/14/23 Yes Lajuanna Pompa, Raynelle Fanning, PA-C  Cholecalciferol (VITAMIN D3)  50 MCG (2000 UT) capsule Take 1 capsule (2,000 Units total) by mouth daily. 04/16/23  Yes Del Newman Nip, Tenna Child, FNP  clobetasol cream (TEMOVATE) 0.05 % Apply 1 Application topically 2 (two) times daily. 04/16/23  Yes Del Newman Nip, Tenna Child, FNP  clopidogrel (PLAVIX) 75 MG tablet Take 75 mg by mouth daily. 04/28/23  Yes [provider]  gabapentin (NEURONTIN) 300 MG capsule Take 300 mg by mouth 3 (three) times daily. 10/09/22  Yes [provider]  HUMULIN R 100 UNIT/ML injection Inject into the skin. Inject 11 units before breakfast and 5 units before lunch and dinner. 10/14/22  Yes [provider]  LEVEMIR FLEXPEN 100 UNIT/ML FlexPen Inject 12 Units into the skin at bedtime. 02/26/23  Yes [provider]  lisinopril-hydrochlorothiazide (ZESTORETIC) 20-12.5 MG tablet TAKE ONE TABLET BY MOUTH ONCE DAILY. 01/11/21  Yes [place], Velna Hatchet, MD  Rimegepant Sulfate (NURTEC) 75 MG TBDP Take 1 tablet (75 mg total) by mouth once as needed for up to 1 dose. 04/16/23  Yes [place] Newman Nip, Tenna Child, FNP  rosuvastatin (CRESTOR) 40 MG tablet Take 1 tablet (40 mg total) by mouth at bedtime. 02/10/23  Yes Micki Riley, MD  Accu-Chek Softclix Lancets lancets SMARTSIG:2 Topical Twice Daily 01/15/23   [provider]  SURE COMFORT INSULIN SYRINGE 31G X 5/16" 0.5 ML MISC SMARTSIG:Injection As Directed 12/22/22   [provider]  SURE COMFORT PEN NEEDLES 32G X 4 MM MISC  01/15/23   [provider]  Allergies    Aspirin, Metformin and related, Other, Oxycodone, Diphenhydramine hcl, Latex, and Neosporin [neomycin-bacitracin zn-polymyx]    Review of Systems   Review of Systems  Constitutional:  Positive for appetite change. Negative for fever.  HENT:  Negative for congestion, sinus pressure and sore throat.        Negative except as mentioned in HPI.    Eyes: Negative.   Respiratory:  Negative for chest tightness and shortness of breath.   Cardiovascular:   Negative for chest pain.  Gastrointestinal:  Negative for abdominal pain and nausea.  Genitourinary: Negative.   Musculoskeletal:  Negative for arthralgias, joint swelling and neck pain.  Skin: Negative.  Negative for rash and wound.  Neurological:  Positive for light-headedness. Negative for dizziness, weakness, numbness and headaches.  Psychiatric/Behavioral: Negative.    All other systems reviewed and are negative.   Physical Exam Updated Vital Signs BP (!) 110/58   Pulse 85   Temp 98.4 F (36.9 C) (Oral)   Resp 18   Ht 5\' 1"  (1.549 m)   Wt 75.8 kg   SpO2 100%   BMI 31.55 kg/m  Physical Exam Vitals and nursing note reviewed.  Constitutional:      Appearance: She is well-developed.  HENT:     Head: Normocephalic and atraumatic.     Mouth/Throat:     Mouth: Mucous membranes are dry.  Eyes:     Conjunctiva/sclera: Conjunctivae normal.  Cardiovascular:     Rate and Rhythm: Normal rate and regular rhythm.     Heart sounds: Normal heart sounds.  Pulmonary:     Effort: Pulmonary effort is normal.     Breath sounds: Normal breath sounds. No wheezing.  Abdominal:     General: Bowel sounds are normal.     Palpations: Abdomen is soft.     Tenderness: There is no abdominal tenderness. There is no guarding.  Musculoskeletal:        General: Normal range of motion.     Cervical back: Normal range of motion.  Skin:    General: Skin is warm and dry.  Neurological:     Mental Status: She is alert and oriented to person, place, and time. Mental status is at baseline.     Sensory: Sensory deficit present.     Comments: Equal grip strength.     ED Results / Procedures / Treatments   Labs (all labs ordered are listed, but only abnormal results are displayed) Labs Reviewed  BASIC METABOLIC PANEL - Abnormal; Notable for the following components:      Result Value   Glucose, Bld 210 (*)    BUN 26 (*)    Creatinine, Ser 1.31 (*)    GFR, Estimated 48 (*)    All other  components within normal limits  URINALYSIS, ROUTINE W REFLEX MICROSCOPIC - Abnormal; Notable for the following components:   APPearance CLOUDY (*)    Protein, ur 30 (*)    Leukocytes,Ua LARGE (*)    Bacteria, UA MANY (*)    All other components within normal limits  CBG MONITORING, ED - Abnormal; Notable for the following components:   Glucose-Capillary 171 (*)    All other components within normal limits  CBC  MAGNESIUM  RAPID URINE DRUG SCREEN, HOSP PERFORMED  LACTIC ACID, PLASMA    EKG None   ED ECG REPORT   Date: 05/07/2023  Rate: 100  Rhythm: normal sinus rhythm  QRS Axis: normal  Intervals: normal  ST/T Wave abnormalities: normal  Conduction Disutrbances:none  Narrative Interpretation:   Old EKG Reviewed: unchanged  I have personally reviewed the EKG tracing and agree with the computerized printout as noted.  Radiology MR BRAIN WO CONTRAST  Result Date: 05/06/2023 CLINICAL DATA:  Left facial numbness and headache EXAM: MRI HEAD WITHOUT CONTRAST TECHNIQUE: Multiplanar, multiecho pulse sequences of the brain and surrounding structures were obtained without intravenous contrast. COMPARISON:  12/02/2022 FINDINGS: Brain: No restricted diffusion to suggest acute or subacute infarct. No acute hemorrhage, mass, mass effect, or midline shift. No hydrocephalus or extra-axial collection. Normal craniocervical junction. Small amount of hemosiderin deposition is associated with a remote posterior right lentiform nucleus infarct, which was acute on 12/02/2022. No evidence of superficial siderosis. Normal cerebral volume for age. Vascular: Normal arterial flow voids. Skull and upper cervical spine: Normal marrow signal. Sinuses/Orbits: Clear paranasal sinuses. No acute finding in the orbits. Other: The mastoid air cells are well aerated. IMPRESSION: No acute intracranial process. No etiology is seen for the patient's symptoms. Electronically Signed   By: Wiliam Ke M.D.   On:  05/06/2023 19:47    Procedures Procedures    Medications Ordered in ED Medications  sodium chloride 0.9 % bolus 1,000 mL (0 mLs Intravenous Stopped 05/07/23 0039)  cefTRIAXone (ROCEPHIN) 1 g in sodium chloride 0.9 % 100 mL IVPB (0 g Intravenous Stopped 05/06/23 2307)    ED Course/ Medical Decision Making/ A&P                             Medical Decision Making Patient presenting with a 2-day history of lightheadedness, weakness, headache, reduced p.o. intake really since her CVA in December, but worse over the past 2 days.  No complaints of abdominal pain chest pain, no new focal weakness.  Differential diagnoses including cerebella CVA, however she does not describe vertiginous symptoms, more simple lightheadedness with positional changes.  Dehydration, hyperglycemia, DKA, other metabolic abnormality.  Amount and/or Complexity of Data Reviewed Labs: ordered.    Details: Labs are significant for a glucose of 210, no anion gap, normal CO2.  She does have an elevated BUN at 26 and a creatinine of 1.31 where her creatinine 4 months ago was 0.66, over the past 3 weeks it has slowly increased to today's 1.31. Radiology: ordered.    Details: MRI head scratch that MRI brain negative for acute findings, no acute CVA.           Final Clinical Impression(s) / ED Diagnoses Final diagnoses:  Acute cystitis without hematuria  Dehydration    Rx / DC Orders ED Discharge Orders          Ordered    cephALEXin (KEFLEX) 500 MG capsule  2 times daily        05/07/23 0045              Burgess Amor, PA-C 05/07/23 1610    Vanetta Mulders, MD 05/09/23 2329

## 2023-05-07 NOTE — Discharge Instructions (Signed)
Take the entire course of the antibiotics prescribed.  It will be very important that you are drinking plenty of hydrating fluids as clinically you were dehydrated here which I suspect is the main reason why you were lightheaded and had a headache.  Tea is not hydrating, any fluids with caffeine will not hydrate your body.  I recommend water, juice, flavored waters if you are unable to tolerate drinking regular water.

## 2023-05-08 ENCOUNTER — Ambulatory Visit: Payer: Medicaid Other | Admitting: Family Medicine

## 2023-05-08 ENCOUNTER — Encounter: Payer: Self-pay | Admitting: Family Medicine

## 2023-05-08 VITALS — BP 123/82 | HR 92 | Ht 61.0 in | Wt 169.0 lb

## 2023-05-08 DIAGNOSIS — N3090 Cystitis, unspecified without hematuria: Secondary | ICD-10-CM | POA: Diagnosis not present

## 2023-05-08 NOTE — Patient Instructions (Signed)
        Great to see you today.   - Please take medications as prescribed. - Follow up with your primary health provider if any health concerns arises. - If symptoms worsen please contact your primary care provider and/or visit the emergency department.  

## 2023-05-08 NOTE — Progress Notes (Signed)
Patient Office Visit   Subjective   Patient ID: Kayla Choi, female    DOB: May 12, 1966  Age: 57 y.o. MRN: [id number]  CC:  Chief Complaint  Patient presents with   Follow-up    Patient was in the ED on 5/29 for cystitis. Patient states she is feeling a little better but not a whole lot since ED.     HPI Kayla Choi 57 year old female, presents to clinic for ER follow up cystitis. She  has a past medical history of Acid reflux, ADD (attention deficit disorder), Anemia, Anxiety, Asthma, Balance problem, Chest pain, COPD (chronic obstructive pulmonary disease) (HCC), Depression, Diabetes mellitus, Glaucoma, Hyperlipidemia, Hypertension, Normal cardiac stress test (12/2015), Panic attack, Prediabetes, Recurrent falls, and Social phobia.For the details of today's visit, please refer to assessment and plan.   HPI    Outpatient Encounter Medications as of 05/08/2023  Medication Sig   Accu-Chek Softclix Lancets lancets SMARTSIG:2 Topical Twice Daily   albuterol (PROVENTIL) (2.5 MG/3ML) 0.083% nebulizer solution Take 3 mLs (2.5 mg total) by nebulization every 4 (four) hours as needed for wheezing or shortness of breath.   albuterol (VENTOLIN HFA) 108 (90 Base) MCG/ACT inhaler Inhale 2 puffs into the lungs every 4 (four) hours as needed for wheezing or shortness of breath.   amLODipine (NORVASC) 10 MG tablet Take 10 mg by mouth daily.   cephALEXin (KEFLEX) 500 MG capsule Take 1 capsule (500 mg total) by mouth 2 (two) times daily for 7 days.   Cholecalciferol (VITAMIN D3) 50 MCG (2000 UT) capsule Take 1 capsule (2,000 Units total) by mouth daily.   clobetasol cream (TEMOVATE) 0.05 % Apply 1 Application topically 2 (two) times daily.   clopidogrel (PLAVIX) 75 MG tablet Take 75 mg by mouth daily.   gabapentin (NEURONTIN) 300 MG capsule Take 300 mg by mouth 3 (three) times daily.   HUMULIN R 100 UNIT/ML injection Inject into the skin. Inject 11 units before breakfast and 5 units before lunch  and dinner.   LEVEMIR FLEXPEN 100 UNIT/ML FlexPen Inject 12 Units into the skin at bedtime.   lisinopril-hydrochlorothiazide (ZESTORETIC) 20-12.5 MG tablet TAKE ONE TABLET BY MOUTH ONCE DAILY.   Rimegepant Sulfate (NURTEC) 75 MG TBDP Take 1 tablet (75 mg total) by mouth once as needed for up to 1 dose.   rosuvastatin (CRESTOR) 40 MG tablet Take 1 tablet (40 mg total) by mouth at bedtime.   SURE COMFORT INSULIN SYRINGE 31G X 5/16" 0.5 ML MISC SMARTSIG:Injection As Directed   SURE COMFORT PEN NEEDLES 32G X 4 MM MISC    No facility-administered encounter medications on file as of 05/08/2023.    Past Surgical History:  Procedure Laterality Date   ABDOMINAL HYSTERECTOMY     fibroids, both ovaries left intact   ANKLE SURGERY     CHOLECYSTECTOMY     COLONOSCOPY N/A 10/18/2019   Procedure: COLONOSCOPY;  Surgeon: Corbin Ade, MD;  [facility];  Service: Endoscopy;  Laterality: N/A;  1:00-office rescheduled to 11/10 @ 12:45pm   COSMETIC SURGERY     elbow   FRACTURE SURGERY     Recurrent elbow surgery   LIPOMA EXCISION  04/30/2012   Procedure: EXCISION LIPOMA;  Surgeon: Fabio Bering, MD;  Location: [facility];  Service: General;  Laterality: Right;  Excision soft tissue mass right thigh   MASS EXCISION Right 12/19/2020   Procedure: EXCISION CYST; 2 CM; AXILLA;  Surgeon: Lucretia Roers, MD;  Location: [facility];  Service: General;  Laterality: Right;   ORIF ANKLE FRACTURE Right 12/15/2017   Procedure: OPEN REDUCTION INTERNAL FIXATION (ORIF) RIGHT ANKLE FRACTURE;  Surgeon: Sheral Apley, MD;  Location: [facility];  Service: Orthopedics;  Laterality: Right;   POLYPECTOMY  10/18/2019   Procedure: POLYPECTOMY;  Surgeon: Corbin Ade, MD;  [facility]: [facility];  Service: Endoscopy;;  colon   right elbow     reconstruction    Review of Systems  Constitutional:  Negative for chills and fever.  Eyes:  Negative for blurred vision.  Respiratory:  Negative for  cough.   Cardiovascular:  Negative for chest pain.  Gastrointestinal:  Negative for abdominal pain.  Genitourinary:  Negative for dysuria.  Musculoskeletal:  Negative for myalgias.  Neurological:  Negative for dizziness and headaches.      Objective    BP 123/82   Pulse 92   Ht 5\' 1"  (1.549 m)   Wt 169 lb (76.7 kg)   SpO2 98%   BMI 31.93 kg/m   Physical Exam Vitals reviewed.  Constitutional:      General: She is not in acute distress.    Appearance: Normal appearance. She is not ill-appearing, toxic-appearing or diaphoretic.  HENT:     Head: Normocephalic.  Eyes:     General:        Right eye: No discharge.        Left eye: No discharge.     Conjunctiva/sclera: Conjunctivae normal.  Cardiovascular:     Rate and Rhythm: Normal rate.     Pulses: Normal pulses.     Heart sounds: Normal heart sounds.  Pulmonary:     Effort: Pulmonary effort is normal. No respiratory distress.     Breath sounds: Normal breath sounds.  Abdominal:     General: Bowel sounds are normal.     Palpations: Abdomen is soft.     Tenderness: There is no abdominal tenderness. There is no guarding.  Musculoskeletal:        General: Normal range of motion.     Cervical back: Normal range of motion.  Skin:    General: Skin is warm and dry.     Capillary Refill: Capillary refill takes less than 2 seconds.  Neurological:     General: No focal deficit present.     Mental Status: She is alert and oriented to person, place, and time.     Coordination: Coordination normal.     Gait: Gait normal.  Psychiatric:        Mood and Affect: Mood normal.        Behavior: Behavior normal.       Assessment & Plan:  Cystitis Assessment & Plan: Patient reported compliant with antibiotic medication prescribed in the ER. Explained to increase oral fluid intake. Drink 8 glasses of water daily.  Follow up for worsening or persistent symptoms. Patient verbalizes understanding regarding plan of care and all  questions answered.       Return if symptoms worsen or fail to improve.   Cruzita Lederer Newman Nip, FNP

## 2023-05-08 NOTE — Assessment & Plan Note (Addendum)
Patient reported compliant with antibiotic medication prescribed in the ER. Explained to increase oral fluid intake. Drink 8 glasses of water daily.  Follow up for worsening or persistent symptoms. Patient verbalizes understanding regarding plan of care and all questions answered.

## 2023-05-14 ENCOUNTER — Telehealth: Payer: Self-pay | Admitting: Gastroenterology

## 2023-05-14 NOTE — Telephone Encounter (Signed)
Please offer patient follow up appt in the next 2-3 weeks to consider scheduling her surveillance colonoscopy. She was sent to er from the office at time of her last visit.

## 2023-05-18 ENCOUNTER — Ambulatory Visit (HOSPITAL_COMMUNITY)
Admission: RE | Admit: 2023-05-18 | Discharge: 2023-05-18 | Disposition: A | Discharge status: Home / Self Care | Payer: Medicaid Other | Source: Ambulatory Visit | Attending: Family Medicine | Admitting: Family Medicine

## 2023-05-18 DIAGNOSIS — R634 Abnormal weight loss: Secondary | ICD-10-CM | POA: Diagnosis present

## 2023-05-21 ENCOUNTER — Other Ambulatory Visit: Payer: Self-pay | Admitting: Family Medicine

## 2023-05-21 DIAGNOSIS — R630 Anorexia: Secondary | ICD-10-CM

## 2023-05-22 ENCOUNTER — Other Ambulatory Visit: Payer: Self-pay

## 2023-05-22 ENCOUNTER — Encounter: Payer: Self-pay | Admitting: Family Medicine

## 2023-05-22 MED ORDER — SURE COMFORT PEN NEEDLES 32G X 4 MM MISC: 2 refills | Status: DC

## 2023-05-22 MED ORDER — "SURE COMFORT INSULIN SYRINGE 31G X 5/16"" 0.5 ML MISC": 2 refills | Status: DC

## 2023-05-22 NOTE — Telephone Encounter (Signed)
Refills sent

## 2023-06-03 NOTE — Progress Notes (Unsigned)
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 He is still having left-sided poststroke paresthesias and mild weakness and mild cognitive difficulties 30-day cardiac monitor was negative for A-fib. MOCA 23/30.  -Continue Plavix 75 mg daily for secondary stroke prevention -Needs to continue to work close with PCP for management of vascular risk factors, on Crestor, insulin; Strict management of vascular risk factors with a goal BP less than 130/90, A1c less than 7.0, LDL less than 70 for secondary stroke prevention -Referral to Occupational Therapy to work on fine motor skills specifically to the left hand, gait -Increase Topamax 50 mg twice daily for left-sided poststroke paresthesias, also on gabapentin 300 mg 3 times daily -Referral for sleep consult rule out OSA in the setting of reported snoring, morning headache/heaviness to the head, witnessed apnea spells by boyfriend, ESS 14 -Follow-up in 6 months or sooner if needed with Dr. Pearlean Brownie   Orders Placed This Encounter  Procedures   Ambulatory referral to Occupational Therapy   Ambulatory referral to Sleep Studies   Meds ordered this encounter  Medications   topiramate (TOPAMAX) 50 MG tablet    Sig: Take 1 tablet (50 mg total) by mouth 2 (two) times daily.    Dispense:  60 tablet    Refill:  6   HISTORY OF PRESENT ILLNESS: Today 06/04/23 Here for follow-up, saw Dr. Pearlean Brownie for initial visit 02/09/2023.  Recommended to continue Plavix 75 mg daily.  Trial Topamax 25 mg twice daily for poststroke paresthesias and pain.  30-day cardiac monitor study was negative for A-fib.  LDL 123, cholesterol 204, Q6V 9.0.  Crestor was increased  40 mg daily. Recent labs 04/10/23 A1C 7.9, LDL 168. Has paperwork to get a personal aid to help with showering, she feels dizzy when she gets out. Need supervision with cooking, she is forgetful. She has a roommate who isn't there all the time. Finished PT. She doesn't drive. Since stroke, some memory trouble, left 1-3 fingers have pins and needles, numbness to left side of face, trouble with fine motor skills with the left hand. Claims taking Topamax 25 mg TID. When she gets mad the paresthesia symptoms worsen. At night her hand paresthesia worsens. Getting around okay, no falls. Remains on Plavix 75 mg daily, Crestor 40 mg daily, on Insulin 3 times daily. PCP gave her Nurtec PRN for headache, frontal, occipital, feels head is heavy. Frequency varies. Also taking gabapentin 300 mg TID. Mentions headache in the morning, head feels heavy, she snores, boyfriend reports she will stop breathing. ESS 14. Her boyfriend brought her, but he is in the lobby. BP today 121/70.  HISTORY  02/09/23 Dr. Pearlean Brownie HPI: Kayla Choi is a 57 year old pleasant African-American lady seen today for initial office consultation visit for stroke.  History is obtained from the patient and review of electronic medical records and I have reviewed pertinent available imaging films in PACS. She has past medical history of hypertension, hyperlipidemia, diabetes, COPD, acid reflux, iron deficiency anemia and asthma.  Patient stated that she was driving on 78/46/9629 when she developed sudden onset of numbness involving left side of the face and arm.  She went to [facility] emergency room.  Initially code stroke was not activated due to her difficult symptoms.  She  had an MRI at the next day which showed a  large 1.9 x 1.4 x 2.9 cm right corona radiata infarct with MR angiogram showing multifocal right M1 stenosis and moderate left P2 stenosis.  However CT angiogram does not show significant narrowing of any large intracranial vessels.  2D echo showed  normal ejection fraction of 60 to 65% without cardiac source of embolism.  Hemoglobin A1c was elevated at 11.2 and LDL cholesterol at 148.  Urine drug screen was negative.  Patient was not started on aspirin due to allergy history and strict given Plavix alone.  She states she has done well since discharge made gradual improvement.  She still has weakness in the left hand and hand muscles.  She still has some intermittent numbness in the left face and body.  She has also noticed some short-term memory and cognitive difficulties poststroke.  She is currently doing occupational and physical therapy and finds it helpful.  Having still persistent paresthesias and discomfort and pain in the left hand.  She has tried taking Tylenol which has not helped.  He has not undergone any outpatient cardiac monitoring for his A-fib.  He clinical diabetes is under better control and she has an upcoming visit.  Tolerating Crestor well without muscle aches and pains.  Her blood pressure is usually better at home though it is slightly elevated today at 156/89.  She has not had any other recurrent stroke or TIA symptoms.  She denies any prior history of DVT, pulm embolism or stroke in the in the family.  REVIEW OF SYSTEMS: Out of a complete 14 system review of symptoms, the patient complains only of the following symptoms, and all other reviewed systems are negative.  See HPI  ALLERGIES: Allergies  Allergen Reactions   Aspirin Shortness Of Breath and Itching   Metformin And Related Anaphylaxis   Other Anaphylaxis    Green peas   Oxycodone Itching   Diphenhydramine Hcl Rash   Latex Itching and Rash   Neosporin [Neomycin-Bacitracin Zn-Polymyx] Swelling, Rash and Other (See Comments)    skin peeling    HOME MEDICATIONS: Outpatient Medications Prior to Visit  Medication Sig Dispense Refill   albuterol (PROVENTIL) (2.5 MG/3ML) 0.083% nebulizer solution Take 3 mLs (2.5 mg total) by nebulization every 4 (four) hours as  needed for wheezing or shortness of breath. 75 mL 2   albuterol (VENTOLIN HFA) 108 (90 Base) MCG/ACT inhaler Inhale 2 puffs into the lungs every 4 (four) hours as needed for wheezing or shortness of breath. 18 g 2   amLODipine (NORVASC) 10 MG tablet Take 10 mg by mouth daily.     Cholecalciferol (VITAMIN D3) 50 MCG (2000 UT) capsule Take 1 capsule (2,000 Units total) by mouth daily. 90 capsule 1   clobetasol cream (TEMOVATE) 0.05 % Apply 1 Application topically 2 (two) times daily. 30 g 0   clopidogrel (PLAVIX) 75 MG tablet Take 75 mg by mouth daily.     gabapentin (NEURONTIN) 300 MG capsule Take 300 mg by mouth 3 (three) times daily.     HUMULIN R 100 UNIT/ML injection Inject into the skin. Inject 11 units before breakfast and 5 units before lunch and dinner.     LEVEMIR FLEXPEN 100 UNIT/ML FlexPen Inject 12 Units into the skin at bedtime.     lisinopril-hydrochlorothiazide (ZESTORETIC) 20-12.5 MG tablet TAKE ONE TABLET BY MOUTH ONCE DAILY. 90 tablet 3   Rimegepant Sulfate (NURTEC) 75 MG TBDP Take 1 tablet (75 mg total) by mouth once  as needed for up to 1 dose. 8 tablet 1   rosuvastatin (CRESTOR) 40 MG tablet Take 1 tablet (40 mg total) by mouth at bedtime. 30 tablet 3   SURE COMFORT INSULIN SYRINGE 31G X 5/16" 0.5 ML MISC SMARTSIG:Injection As Directed 100 each 2   SURE COMFORT PEN NEEDLES 32G X 4 MM MISC Use with insulin daily 100 each 2   Accu-Chek Softclix Lancets lancets SMARTSIG:2 Topical Twice Daily     No facility-administered medications prior to visit.    PAST MEDICAL HISTORY: Past Medical History:  Diagnosis Date   Acid reflux    ADD (attention deficit disorder)    Anemia    iron deficinecy   Anxiety    with social phobia   Asthma    Balance problem    Chest pain    that occurs with anxiety   COPD (chronic obstructive pulmonary disease) (HCC)    Depression    Diabetes mellitus    Glaucoma    Hyperlipidemia    Hypertension    Normal cardiac stress test 12/2015   low  risk study   Panic attack    Prediabetes    Recurrent falls    Social phobia     PAST SURGICAL HISTORY: Past Surgical History:  Procedure Laterality Date   ABDOMINAL HYSTERECTOMY     fibroids, both ovaries left intact   ANKLE SURGERY     CHOLECYSTECTOMY     COLONOSCOPY N/A 10/18/2019   Procedure: COLONOSCOPY;  Surgeon: Corbin Ade, MD;  [facility];  Service: Endoscopy;  Laterality: N/A;  1:00-office rescheduled to 11/10 @ 12:45pm   COSMETIC SURGERY     elbow   FRACTURE SURGERY     Recurrent elbow surgery   LIPOMA EXCISION  04/30/2012   Procedure: EXCISION LIPOMA;  Surgeon: Fabio Bering, MD;  Location: [facility];  Service: General;  Laterality: Right;  Excision soft tissue mass right thigh   MASS EXCISION Right 12/19/2020   Procedure: EXCISION CYST; 2 CM; AXILLA;  Surgeon: Lucretia Roers, MD;  Location: [facility];  Service: General;  Laterality: Right;   ORIF ANKLE FRACTURE Right 12/15/2017   Procedure: OPEN REDUCTION INTERNAL FIXATION (ORIF) RIGHT ANKLE FRACTURE;  Surgeon: Sheral Apley, MD;  Location: [facility];  Service: Orthopedics;  Laterality: Right;   POLYPECTOMY  10/18/2019   Procedure: POLYPECTOMY;  Surgeon: Corbin Ade, MD;  [facility];  Service: Endoscopy;;  colon   right elbow     reconstruction    FAMILY HISTORY: Family History  Problem Relation Age of Onset   Hypertension Mother    Diabetes Father    Heart disease Father    Anesthesia problems Neg Hx    Malignant hyperthermia Neg Hx    Hypotension Neg Hx    Pseudochol deficiency Neg Hx     SOCIAL HISTORY: Social History   Socioeconomic History   Marital status: Single    Spouse name: Not on file   Number of children: 2   Years of education: Not on file   Highest education level: Not on file  Occupational History   Not on file  Tobacco Use   Smoking status: Never   Smokeless tobacco: Never  Vaping Use   Vaping Use: Never used  Substance and  Sexual Activity   Alcohol use: No    Alcohol/week: 0.0 standard drinks of alcohol   Drug use: No   Sexual activity: Yes    Birth control/protection:  Surgical  Other Topics Concern   Not on file  Social History Narrative   Not on file   Social Determinants of Health   Financial Resource Strain: Not on file  Food Insecurity: No Food Insecurity (12/02/2022)   Hunger Vital Sign    Worried About Running Out of Food in the Last Year: Never true    Ran Out of Food in the Last Year: Never true  Transportation Needs: No Transportation Needs (12/02/2022)   PRAPARE - Administrator, Civil Service (Medical): No    Lack of Transportation (Non-Medical): No  Physical Activity: Not on file  Stress: Not on file  Social Connections: Not on file  Intimate Partner Violence: Not At Risk (12/02/2022)   Humiliation, Afraid, Rape, and Kick questionnaire    Fear of Current or Ex-Partner: No    Emotionally Abused: No    Physically Abused: No    Sexually Abused: No   PHYSICAL EXAM  Vitals:   06/04/23 0743  BP: 121/70  Pulse: 88  Weight: 166 lb (75.3 kg)  Height: 5\' 1"  (1.549 m)   Body mass index is 31.37 kg/m.    06/04/2023    8:10 AM  Montreal Cognitive Assessment   Visuospatial/ Executive (0/5) 4  Naming (0/3) 3  Attention: Read list of digits (0/2) 0  Attention: Read list of letters (0/1) 1  Attention: Serial 7 subtraction starting at 100 (0/3) 2  Language: Repeat phrase (0/2) 0  Language : Fluency (0/1) 1  Abstraction (0/2) 2  Delayed Recall (0/5) 4  Orientation (0/6) 6  Total 23   Generalized: Well developed, in no acute distress  Neurological examination  Mentation: Alert oriented to time, place,Follows all commands , speech is slow, at times slurred, somewhat poor historian  Cranial nerve II-XII: Pupils were equal round reactive to light. Extraocular movements were full, visual field were full on confrontational test.  Mild left-sided facial asymmetry around the  mouth.  Head turning and shoulder shrug  were normal and symmetric. Motor: The motor testing reveals 5 over 5 strength of all 4 extremities, but slightly weak left hand grip.  Mild weakness left hand adduction and abduction. Sensory: Reports decreased soft touch sensation to the left face, arm Coordination: Cerebellar testing reveals good finger-nose-finger and heel-to-shin bilaterally.  Slight trouble with fine motor movements to the left fingers. Gait and station: Gait is normal, but tendency to have slight limp/drag on the left Reflexes: Deep tendon reflexes are symmetric and normal bilaterally.   DIAGNOSTIC DATA (LABS, IMAGING, TESTING) - I reviewed patient records, labs, notes, testing and imaging myself where available.  Lab Results  Component Value Date   WBC 6.8 05/06/2023   HGB 12.3 05/06/2023   HCT 38.3 05/06/2023   MCV 95.3 05/06/2023   PLT 370 05/06/2023      Component Value Date/Time   NA 139 05/06/2023 1745   NA 141 04/10/2023 0945   K 3.6 05/06/2023 1745   CL 99 05/06/2023 1745   CO2 29 05/06/2023 1745   GLUCOSE 210 (H) 05/06/2023 1745   BUN 26 (H) 05/06/2023 1745   BUN 15 04/10/2023 0945   CREATININE 1.31 (H) 05/06/2023 1745   CREATININE 0.89 12/26/2020 1602   CALCIUM 9.2 05/06/2023 1745   PROT 7.0 04/10/2023 0945   ALBUMIN 4.2 04/10/2023 0945   AST 18 04/10/2023 0945   ALT 20 04/10/2023 0945   ALKPHOS 44 04/10/2023 0945   BILITOT 0.9 04/10/2023 0945   GFRNONAA 48 (L) 05/06/2023  1745   GFRAA >60 02/18/2019 0817   Lab Results  Component Value Date   CHOL 245 (H) 04/10/2023   HDL 54 04/10/2023   LDLCALC 168 (H) 04/10/2023   LDLDIRECT 136 (H) 06/25/2012   TRIG 127 04/10/2023   CHOLHDL 4.5 (H) 04/10/2023   Lab Results  Component Value Date   HGBA1C 7.9 (H) 04/10/2023   No results found for: "VITAMINB12" Lab Results  Component Value Date   TSH 1.030 04/10/2023    Margie Ege, AGNP-C, DNP 06/04/2023, 8:04 AM [facility] [address] [place], [place] [address] [phone]

## 2023-06-04 ENCOUNTER — Ambulatory Visit: Payer: Medicaid Other | Admitting: Neurology

## 2023-06-04 ENCOUNTER — Encounter: Payer: Self-pay | Admitting: Neurology

## 2023-06-04 VITALS — BP 121/70 | HR 88 | Ht 61.0 in | Wt 166.0 lb

## 2023-06-04 DIAGNOSIS — I10 Essential (primary) hypertension: Secondary | ICD-10-CM

## 2023-06-04 DIAGNOSIS — I6381 Other cerebral infarction due to occlusion or stenosis of small artery: Secondary | ICD-10-CM | POA: Diagnosis not present

## 2023-06-04 DIAGNOSIS — I6990 Unspecified sequelae of unspecified cerebrovascular disease: Secondary | ICD-10-CM | POA: Diagnosis not present

## 2023-06-04 DIAGNOSIS — E782 Mixed hyperlipidemia: Secondary | ICD-10-CM | POA: Diagnosis not present

## 2023-06-04 DIAGNOSIS — I639 Cerebral infarction, unspecified: Secondary | ICD-10-CM | POA: Insufficient documentation

## 2023-06-04 DIAGNOSIS — R0683 Snoring: Secondary | ICD-10-CM

## 2023-06-04 DIAGNOSIS — R202 Paresthesia of skin: Secondary | ICD-10-CM | POA: Insufficient documentation

## 2023-06-04 MED ORDER — TOPIRAMATE 50 MG PO TABS: 50.0000 mg | ORAL_TABLET | Freq: Two times a day (BID) | ORAL | 6 refills | Status: DC

## 2023-06-04 NOTE — Patient Instructions (Addendum)
Increase Topamax 50 mg twice daily for pins and needles/numb sensation  I will order Occupational Therapy to work on your left hand and leg Continue the Plavix for secondary stroke prevention  Need to work with your primary care doctor for better management of cholesterol and diabetes  Strict management of vascular risk factors with a goal BP less than 130/90, A1c less than 7.0, LDL less than 70 for secondary stroke prevention Referral for Sleep Consultation rule out OSA Follow up in 6 months with Dr. Pearlean Brownie   Orders Placed This Encounter  Procedures   Ambulatory referral to Occupational Therapy   Ambulatory referral to Sleep Studies   Meds ordered this encounter  Medications   topiramate (TOPAMAX) 50 MG tablet    Sig: Take 1 tablet (50 mg total) by mouth 2 (two) times daily.    Dispense:  60 tablet    Refill:  6

## 2023-06-04 NOTE — Progress Notes (Signed)
I agree with the above plan 

## 2023-06-22 NOTE — Telephone Encounter (Signed)
Received VM from pt wanting to schedule colonoscopy. Per Verlon Au below, needs OV.

## 2023-06-23 ENCOUNTER — Ambulatory Visit: Payer: Medicaid Other | Attending: Neurology | Admitting: Occupational Therapy

## 2023-06-23 DIAGNOSIS — M6281 Muscle weakness (generalized): Secondary | ICD-10-CM | POA: Insufficient documentation

## 2023-06-23 DIAGNOSIS — R2681 Unsteadiness on feet: Secondary | ICD-10-CM | POA: Insufficient documentation

## 2023-06-23 DIAGNOSIS — R278 Other lack of coordination: Secondary | ICD-10-CM | POA: Insufficient documentation

## 2023-06-23 DIAGNOSIS — R208 Other disturbances of skin sensation: Secondary | ICD-10-CM | POA: Insufficient documentation

## 2023-07-01 ENCOUNTER — Other Ambulatory Visit: Payer: Self-pay

## 2023-07-01 DIAGNOSIS — E119 Type 2 diabetes mellitus without complications: Secondary | ICD-10-CM

## 2023-07-01 MED ORDER — BLOOD GLUCOSE MONITORING SUPPL DEVI
1.0000 | Freq: Three times a day (TID) | 0 refills | Status: DC
Start: 2023-07-01 — End: 2024-01-30

## 2023-07-01 NOTE — Therapy (Signed)
OUTPATIENT OCCUPATIONAL THERAPY NEURO EVALUATION  Patient Name: Kayla Choi MRN: [id number] DOB:1966/10/07, 57 y.o., female Today's Date: 07/02/2023  PCP: Ivor Costa, FNP REFERRING PROVIDER: Glean Salvo, NP  END OF SESSION:  OT End of Session - 07/02/23 1236     Visit Number 1    Number of Visits 9    Date for OT Re-Evaluation 08/08/23    Authorization Type HB MCD - awaiting approval    OT Start Time 1100    OT Stop Time 1145    OT Time Calculation (min) 45 min    Activity Tolerance Patient tolerated treatment well    Behavior During Therapy Anxious             Past Medical History:  Diagnosis Date   Acid reflux    ADD (attention deficit disorder)    Anemia    iron deficinecy   Anxiety    with social phobia   Asthma    Balance problem    Chest pain    that occurs with anxiety   COPD (chronic obstructive pulmonary disease) (HCC)    Depression    Diabetes mellitus    Glaucoma    Hyperlipidemia    Hypertension    Normal cardiac stress test 12/2015   low risk study   Panic attack    Prediabetes    Recurrent falls    Social phobia    Past Surgical History:  Procedure Laterality Date   ABDOMINAL HYSTERECTOMY     fibroids, both ovaries left intact   ANKLE SURGERY     CHOLECYSTECTOMY     COLONOSCOPY N/A 10/18/2019   Procedure: COLONOSCOPY;  Surgeon: Corbin Ade, MD;  [facility];  Service: Endoscopy;  Laterality: N/A;  1:00-office rescheduled to 11/10 @ 12:45pm   COSMETIC SURGERY     elbow   FRACTURE SURGERY     Recurrent elbow surgery   LIPOMA EXCISION  04/30/2012   Procedure: EXCISION LIPOMA;  Surgeon: Fabio Bering, MD;  Location: [facility];  Service: General;  Laterality: Right;  Excision soft tissue mass right thigh   MASS EXCISION Right 12/19/2020   Procedure: EXCISION CYST; 2 CM; AXILLA;  Surgeon: Lucretia Roers, MD;  Location: [facility];  Service: General;  Laterality: Right;   ORIF ANKLE FRACTURE Right 12/15/2017    Procedure: OPEN REDUCTION INTERNAL FIXATION (ORIF) RIGHT ANKLE FRACTURE;  Surgeon: Sheral Apley, MD;  Location: [facility];  Service: Orthopedics;  Laterality: Right;   POLYPECTOMY  10/18/2019   Procedure: POLYPECTOMY;  Surgeon: Corbin Ade, MD;  [facility];  Service: Endoscopy;;  colon   right elbow     reconstruction   Patient Active Problem List   Diagnosis Date Noted   Stroke [facility]) 06/04/2023   Paresthesia 06/04/2023   Snoring 06/04/2023   Cystitis 05/08/2023   Hypotension 05/06/2023   Lightheadedness 05/06/2023   Blurred vision 05/06/2023   Left hand pain 04/16/2023   Unintentional weight loss 04/16/2023   Left sided numbness 12/01/2022   Class 2 obesity 09/02/2017   Recurrent falls 05/13/2016   Sebaceous cyst of right axilla 03/10/2016   Onychomycosis of toenail 02/28/2015   Knee pain 10/06/2014   Neck muscle spasm 07/02/2014   Migraines 05/12/2014   History of keloid of skin 03/28/2014   Back pain 10/23/2013   Headache 10/05/2012   Inadequate social support 06/25/2012   Microalbuminuria 06/25/2012   Diabetes mellitus, type II (HCC) 04/02/2012  Keloid scar of skin 09/03/2011   COPD (chronic obstructive pulmonary disease) (HCC) 05/21/2007   Hyperlipidemia 01/29/2007   Anxiety state 11/02/2006   MDD (major depressive disorder) 11/02/2006   Essential hypertension 11/02/2006    ONSET DATE: 06/04/2023 (referral date)   REFERRING DIAG: I69.90 (ICD-10-CM) - Late, effect, cerebrovascular disease I63.81 (ICD-10-CM) - Lacunar stroke (HCC) Note:  To work on left hand, left leg with walking, 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 She is still having left-sided poststroke paresthesias and mild weakness and mild cognitive difficulties 3  PRECAUTIONS: Other: NO lifting over 15 lbs (d/t old Rt elbow fx)   WEIGHT BEARING RESTRICTIONS: No  PAIN:  Are you having pain? Yes: NPRS scale: 7/10 Pain location: fingertips Lt hand (mostly first 3)  Pain description: burning, pins/needles Aggravating factors: using it Relieving factors: rest , pressure, massage  FALLS: Has patient fallen in last 6 months?  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, and Leisure: cooking  PATIENT GOALS: get my hand better, take care of 37 year old granddaughter  OBJECTIVE:   HAND DOMINANCE: Right but did use Lt hand a lot prior to stroke since Rt elbow fx several years ago  ADLs: Overall ADLs: rest breaks, slower Transfers/ambulation related to ADLs: Eating: mod I w/ Rt hand, but unable to cut food Grooming: mod I w/ Rt hand UB Dressing: mod I  LB Dressing: mod I - slip on shoes Toileting: mod I  Bathing: pt does but reports difficult using Lt hand to get Rt arm (seated) Tub Shower transfers: mod I - has folding chair in tub/shower   IADLs: Shopping: daughter takes pt, pt uses scooter and daughter assist  Light housekeeping: occasionally washes dishes, laundry, boyfriend does most cleaning Meal Prep: mostly microwaveable items, some light stovetop (drinks Ensure for breakfast)  Community mobility:  relies on boyfriend or daughter  Medication management: has trouble remembering to take meds Financial management: sometimes to forget Handwriting:  denies change (uses Rt hand)   MOBILITY STATUS: Independent   FUNCTIONAL OUTCOME MEASURES: Upper Extremity Functional Scale (UEFS): 14/80 = 17.5% which translates to 82.5% deficit  UPPER EXTREMITY ROM:  BUE AROM WNL's  UPPER EXTREMITY MMT:   RUE MMT grossly 5/5, LUE MMT grossly 4/5  HAND FUNCTION: Grip strength: Right: 63.2 lbs; Left: 42.5 lbs  COORDINATION: 9 Hole Peg test: Right: 21.42 sec; Left: 38.64 sec  SENSATION: Light touch: Impaired - fingertips and hand Lt side (especially first 3 fingers)   EDEMA: none at eval, but pt reports swelling in Lt hand at times   COGNITION: Overall cognitive status:  pt suffers from anxiety and depression which can affect cognition - unsure if memory worse since stroke  VISION: Subjective report: pt reports fluctuates - Lt eye bothers  her more in the morning Baseline vision:  has glasses but doesn't always wear Visual history:  borderline glaucoma  VISION ASSESSMENT: Not tested   PERCEPTION: Not tested  PRAXIS: Not tested  OBSERVATIONS: Pt has had previous outpatient O.T. for stroke (3 months) at [facility] from Feb - March 2024.  Pt limited by decreased motivation and depression/anxiety   TODAY'S TREATMENT:                                                                                                                               N/A - EVAL ONLY   PATIENT EDUCATION: Education details: OT POC Person educated: Patient Education method: Explanation Education comprehension: verbalized understanding  HOME EXERCISE PROGRAM: N/A Today   GOALS: Goals reviewed with patient? Yes   LONG TERM GOALS: Target date: 08/08/23  Independent with Lt hand coordination and putty HEP  Baseline: Not yet addressed Goal status: INITIAL  2.  Pt to improve coordination as evidenced by  reducing speed on 9 hole peg test to 33 sec. Or less Baseline: 38 sec Goal status: INITIAL  3.  Improve Lt grip strength to 47 lbs or greater to assist with opening jars Baseline: 42.5 lbs Goal status: INITIAL  4.  UEFI scale to show less than 75% deficits  Baseline: 82.5%  Goal status: INITIAL  5.  Pt to verbalize understanding with safety considerations and pain management strategies for sensory loss and pain Lt hand Baseline: not yet addressed Goal status: INITIAL  6.  Pt to verbalize understanding with task modifications and A/E to increase safety and independence (tub bench, rocker knife, kitchen items)  Baseline: not yet addressed Goal status: INITIAL  ASSESSMENT:  CLINICAL IMPRESSION: Patient is a 57 y.o. female who was seen today for occupational therapy evaluation for remaining Lt hand deficits in strength and coordination, as well as decreased sensation Lt hand and decreased ability to perform ADLs safely from stroke in December 2023. Pt did have some outpatient therapy at [facility], but could benefit from more O.T. to address these deficits and improve function and safety.   PERFORMANCE DEFICITS: in functional skills including ADLs, IADLs, coordination, dexterity, sensation, strength, pain, Fine motor control, mobility, body mechanics, endurance, decreased knowledge of use of DME, vision, and UE functional use, cognitive skills including memory and safety awareness, and psychosocial skills including coping strategies.   IMPAIRMENTS: are limiting patient from ADLs, IADLs, rest and sleep, leisure, and social participation.   CO-MORBIDITIES: has co-morbidities such as anxiety/depression  that affects occupational performance. Patient will benefit from skilled OT to address above impairments and improve overall function.  MODIFICATION OR ASSISTANCE TO COMPLETE EVALUATION: No modification of tasks or assist necessary to complete an evaluation.  OT OCCUPATIONAL PROFILE AND  HISTORY: Problem focused assessment: Including review of records relating to presenting problem.  CLINICAL DECISION MAKING: LOW - limited treatment options, no task modification necessary  REHAB POTENTIAL: Fair time since stroke, depression/anxiety  EVALUATION  COMPLEXITY: Low    PLAN:  OT FREQUENCY: 2x/week  OT DURATION: 6 weeks, however pt has already used 19 MCD visits earlier this year and will probably only be approved for 8 visits  PLANNED INTERVENTIONS: self care/ADL training, therapeutic exercise, therapeutic activity, neuromuscular re-education, manual therapy, functional mobility training, fluidotherapy, patient/family education, cognitive remediation/compensation, energy conservation, coping strategies training, and DME and/or AE instructions  RECOMMENDED OTHER SERVICES: none at this time  CONSULTED AND AGREED WITH PLAN OF CARE: Patient  PLAN FOR NEXT SESSION: consider fluidotherapy for pain and desensitization, HEP for Lt hand coordination (high level) and green putty HEP  Check all possible CPT codes: [address]- Therapeutic Exercise, [phone]- Neuro Re-education, 97140 - Manual Therapy, 97530 - Therapeutic Activities, 97535 - Self Care, [phone] - Fluidotherapy, [phone] - Cognitive training (First 15 min), and 97130 - Cognitive training (each additional 15 min)    Check all conditions that are expected to impact treatment: {Conditions expected to impact treatment:Diabetes mellitus, Structural or anatomic abnormalities, Neurological condition and/or seizures, and Psychological or psychiatric disorders   If treatment provided at initial evaluation, no treatment charged due to lack of authorization.        Sheran Lawless, OT 07/02/2023, 12:49 PM

## 2023-07-02 ENCOUNTER — Ambulatory Visit: Payer: Medicaid Other | Admitting: Occupational Therapy

## 2023-07-02 ENCOUNTER — Encounter: Payer: Self-pay | Admitting: Occupational Therapy

## 2023-07-02 DIAGNOSIS — R208 Other disturbances of skin sensation: Secondary | ICD-10-CM | POA: Diagnosis present

## 2023-07-02 DIAGNOSIS — R2681 Unsteadiness on feet: Secondary | ICD-10-CM

## 2023-07-02 DIAGNOSIS — R278 Other lack of coordination: Secondary | ICD-10-CM

## 2023-07-02 DIAGNOSIS — M6281 Muscle weakness (generalized): Secondary | ICD-10-CM | POA: Diagnosis present

## 2023-07-07 ENCOUNTER — Ambulatory Visit: Payer: Medicaid Other | Admitting: Occupational Therapy

## 2023-07-07 DIAGNOSIS — M6281 Muscle weakness (generalized): Secondary | ICD-10-CM

## 2023-07-07 NOTE — Therapy (Signed)
[facility] [address] [place], [place], [address] Phone: [phone]   Fax:  [phone]  Patient Details  Name: Kayla Choi MRN: [id number] Date of Birth: 05/04/1966 Referring Provider:  Wylene Men*  Encounter Date: 07/07/2023  Pt arrived but no charge today - pt c/o headache. BP = 130/82, HR = 86. Pt reports she did not eat breakfast this morning and is diabetic. Pt given small coke and granola bar. Pt refused PB crackers. Walked pt to daughter's car and informed daughter of suspected low blood sugar and recommended 5 smaller meals vs. Skipping meals or 3 large meals. Pt informed she needs to make sure she eats breakfast before coming to therapy. No charge for today   Sheran Lawless, OT 07/07/2023, 11:24 AM  [facility] [address] [place], [place], [address] Phone: [phone]   Fax:  [phone]

## 2023-07-13 ENCOUNTER — Ambulatory Visit: Payer: Medicaid Other | Admitting: Occupational Therapy

## 2023-07-14 ENCOUNTER — Other Ambulatory Visit: Payer: Self-pay | Admitting: Family Medicine

## 2023-07-15 ENCOUNTER — Encounter: Payer: Self-pay | Admitting: Gastroenterology

## 2023-07-15 ENCOUNTER — Ambulatory Visit: Payer: Medicaid Other | Admitting: Gastroenterology

## 2023-07-15 ENCOUNTER — Other Ambulatory Visit: Payer: Self-pay | Admitting: Family Medicine

## 2023-07-15 VITALS — BP 170/83 | HR 84 | Temp 98.4°F | Ht 61.0 in | Wt 162.0 lb

## 2023-07-15 DIAGNOSIS — R112 Nausea with vomiting, unspecified: Secondary | ICD-10-CM | POA: Insufficient documentation

## 2023-07-15 DIAGNOSIS — R634 Abnormal weight loss: Secondary | ICD-10-CM

## 2023-07-15 DIAGNOSIS — Z86010 Personal history of colonic polyps: Secondary | ICD-10-CM

## 2023-07-15 DIAGNOSIS — K5900 Constipation, unspecified: Secondary | ICD-10-CM | POA: Diagnosis not present

## 2023-07-15 DIAGNOSIS — R1319 Other dysphagia: Secondary | ICD-10-CM | POA: Diagnosis not present

## 2023-07-15 DIAGNOSIS — R1013 Epigastric pain: Secondary | ICD-10-CM

## 2023-07-15 DIAGNOSIS — R630 Anorexia: Secondary | ICD-10-CM

## 2023-07-15 MED ORDER — CLOPIDOGREL BISULFATE 75 MG PO TABS: 75.0000 mg | ORAL_TABLET | Freq: Every day | ORAL | 3 refills | Status: DC

## 2023-07-15 MED ORDER — LUBIPROSTONE 24 MCG PO CAPS: 24.0000 ug | ORAL_CAPSULE | Freq: Two times a day (BID) | ORAL | 11 refills | Status: DC

## 2023-07-15 MED ORDER — AMLODIPINE BESYLATE 10 MG PO TABS: 10.0000 mg | ORAL_TABLET | Freq: Every day | ORAL | 1 refills | Status: DC

## 2023-07-15 MED ORDER — HUMULIN R 100 UNIT/ML IJ SOLN: INTRAMUSCULAR | 1 refills | Status: DC

## 2023-07-15 MED ORDER — PANTOPRAZOLE SODIUM 40 MG PO TBEC: 40.0000 mg | DELAYED_RELEASE_TABLET | Freq: Every day | ORAL | 15 refills | Status: DC

## 2023-07-15 NOTE — Progress Notes (Signed)
GI Office Note    Referring Provider: Wylene Men* Primary Care Physician:  Rica Records, FNP  Primary Gastroenterologist: Roetta Sessions, MD   Chief Complaint   Chief Complaint  Patient presents with   Colonoscopy    History of Present Illness   Kayla Choi is a 57 y.o. female presenting today for follow up. Last seen in 04/2023. She has history of numerous adenomatous colon polyps on colonoscopy in 10/2019 and is due for 3 year follow up. H/o CVA 11/2022. At time of last ov, she had symptomatic hypotension, worsening blurred vision, headache and was advised ED evaluation. Noted poor appetite/10-20 pound weight loss since stroke.   While in ED, MRI brain without acute findings. She was noted to have UTI.   Presents today to schedule her colonoscopy. She complains of ongoing poor appetite since her stroke. Food has no taste. She has to force herself to eat. Texture of food does not feel right. Has to add hot sauce or vinegar to get any taste at all. Typically has vomiting with Ensure. Notes vomiting after hot sauce/vinegar at times. Typically eats a peppermint after a meal to try and keep food down. Occasional abdominal pain. Has pain shoots through her chest lasting only seconds, happens on occasion. Stools are pasty, no melena, brbpr. Stools are infrequent, no stool last week. Had two soft pasty stools after taking bisacodyl this week. Feels better after BM. Some heartburn. Complains of pill dysphagia. Has to drink couple of glasses of water to wash down her foods. She is worried about ongoing weight loss.      Wt Readings from Last 15 Encounters:  07/15/23 162 lb (73.5 kg)  06/04/23 166 lb (75.3 kg)  05/08/23 169 lb (76.7 kg)  05/06/23 167 lb (75.8 kg)  05/06/23 167 lb (75.8 kg)  05/06/23 167 lb (75.8 kg)  04/16/23 166 lb (75.3 kg)  04/09/23 166 lb (75.3 kg)  12/17/22 189 lb (85.7 kg)  12/02/22 177 lb 0.5 oz (80.3 kg)  01/10/21 188 lb (85.3 kg)   12/26/20 187 lb (84.8 kg)  12/19/20 189 lb (85.7 kg)  11/07/20 191 lb (86.6 kg)  11/06/20 190 lb (86.2 kg)   Abd U/S 05/2023: IMPRESSION: 1. Diffuse increased echogenicity throughout the liver is nonspecific and may be seen with hepatic steatosis or other underlying intrinsic liver disease. 2. The gallbladder is surgically absent. 3. No other abnormalities.    Medications   Current Outpatient Medications  Medication Sig Dispense Refill   albuterol (PROVENTIL) (2.5 MG/3ML) 0.083% nebulizer solution Take 3 mLs (2.5 mg total) by nebulization every 4 (four) hours as needed for wheezing or shortness of breath. 75 mL 2   albuterol (VENTOLIN HFA) 108 (90 Base) MCG/ACT inhaler Inhale 2 puffs into the lungs every 4 (four) hours as needed for wheezing or shortness of breath. 18 g 2   amLODipine (NORVASC) 10 MG tablet Take 1 tablet (10 mg total) by mouth daily. 90 tablet 1   Blood Glucose Monitoring Suppl DEVI 1 each by Does not apply route in the morning, at noon, and at bedtime. May substitute to any manufacturer covered by patient's insurance. 1 each 0   Cholecalciferol (VITAMIN D3) 50 MCG (2000 UT) capsule Take 1 capsule (2,000 Units total) by mouth daily. 90 capsule 1   clobetasol cream (TEMOVATE) 0.05 % Apply 1 Application topically 2 (two) times daily. 30 g 0   clopidogrel (PLAVIX) 75 MG tablet Take 1 tablet (75 mg  total) by mouth daily. 30 tablet 3   gabapentin (NEURONTIN) 300 MG capsule Take 300 mg by mouth 3 (three) times daily.     HUMULIN R 100 UNIT/ML injection Inject 5 units before breakfast and 5 units before lunch and dinner. 10 mL 1   LEVEMIR FLEXPEN 100 UNIT/ML FlexPen Inject 12 Units into the skin at bedtime.     lisinopril-hydrochlorothiazide (ZESTORETIC) 20-12.5 MG tablet TAKE ONE TABLET BY MOUTH ONCE DAILY. 90 tablet 3   Rimegepant Sulfate (NURTEC) 75 MG TBDP Take 1 tablet (75 mg total) by mouth once as needed for up to 1 dose. 8 tablet 1   rosuvastatin (CRESTOR) 40 MG  tablet Take 1 tablet (40 mg total) by mouth at bedtime. 30 tablet 3   SURE COMFORT INSULIN SYRINGE 31G X 5/16" 0.5 ML MISC SMARTSIG:Injection As Directed 100 each 2   SURE COMFORT PEN NEEDLES 32G X 4 MM MISC Use with insulin daily 100 each 2   topiramate (TOPAMAX) 50 MG tablet Take 1 tablet (50 mg total) by mouth 2 (two) times daily. 60 tablet 6   No current facility-administered medications for this visit.    Allergies   Allergies as of 07/15/2023 - Review Complete 07/15/2023  Allergen Reaction Noted   Aspirin Shortness Of Breath and Itching 09/03/2011   Metformin and related Anaphylaxis 04/02/2012   Other Anaphylaxis 04/15/2012   Oxycodone Itching 06/25/2014   Diphenhydramine hcl Rash 03/28/2014   Latex Itching and Rash 01/29/2007   Neosporin [neomycin-bacitracin zn-polymyx] Swelling, Rash, and Other (See Comments) 04/15/2012     Past Medical History   Past Medical History:  Diagnosis Date   Acid reflux    ADD (attention deficit disorder)    Anemia    iron deficinecy   Anxiety    with social phobia   Asthma    Balance problem    Chest pain    that occurs with anxiety   COPD (chronic obstructive pulmonary disease) (HCC)    Depression    Diabetes mellitus    Glaucoma    Hyperlipidemia    Hypertension    Normal cardiac stress test 12/2015   low risk study   Panic attack    Prediabetes    Recurrent falls    Social phobia     Past Surgical History   Past Surgical History:  Procedure Laterality Date   ABDOMINAL HYSTERECTOMY     fibroids, both ovaries left intact   ANKLE SURGERY     CHOLECYSTECTOMY     COLONOSCOPY N/A 10/18/2019   Procedure: COLONOSCOPY;  Surgeon: Corbin Ade, MD;  [facility];  Service: Endoscopy;  Laterality: N/A;  1:00-office rescheduled to 11/10 @ 12:45pm   COSMETIC SURGERY     elbow   FRACTURE SURGERY     Recurrent elbow surgery   LIPOMA EXCISION  04/30/2012   Procedure: EXCISION LIPOMA;  Surgeon: Fabio Bering, MD;   Location: [facility];  Service: General;  Laterality: Right;  Excision soft tissue mass right thigh   MASS EXCISION Right 12/19/2020   Procedure: EXCISION CYST; 2 CM; AXILLA;  Surgeon: Lucretia Roers, MD;  Location: [facility];  Service: General;  Laterality: Right;   ORIF ANKLE FRACTURE Right 12/15/2017   Procedure: OPEN REDUCTION INTERNAL FIXATION (ORIF) RIGHT ANKLE FRACTURE;  Surgeon: Sheral Apley, MD;  [facility];  Service: Orthopedics;  Laterality: Right;   POLYPECTOMY  10/18/2019   Procedure: POLYPECTOMY;  Surgeon: Corbin Ade, MD;  [facility];  Service: Endoscopy;;  colon   right elbow     reconstruction    Past Family History   Family History  Problem Relation Age of Onset   Hypertension Mother    Diabetes Father    Heart disease Father    Anesthesia problems Neg Hx    Malignant hyperthermia Neg Hx    Hypotension Neg Hx    Pseudochol deficiency Neg Hx     Past Social History   Social History   Socioeconomic History   Marital status: Single    Spouse name: Not on file   Number of children: 2   Years of education: Not on file   Highest education level: Not on file  Occupational History   Not on file  Tobacco Use   Smoking status: Never   Smokeless tobacco: Never  Vaping Use   Vaping status: Never Used  Substance and Sexual Activity   Alcohol use: No    Alcohol/week: 0.0 standard drinks of alcohol   Drug use: No   Sexual activity: Yes    Birth control/protection: Surgical  Other Topics Concern   Not on file  Social History Narrative   Not on file   Social Determinants of Health   Financial Resource Strain: Not on file  Food Insecurity: No Food Insecurity (12/02/2022)   Hunger Vital Sign    Worried About Running Out of Food in the Last Year: Never true    Ran Out of Food in the Last Year: Never true  Transportation Needs: No Transportation Needs (12/02/2022)   PRAPARE - Administrator, Civil Service  (Medical): No    Lack of Transportation (Non-Medical): No  Physical Activity: Not on file  Stress: Not on file  Social Connections: Not on file  Intimate Partner Violence: Not At Risk (12/02/2022)   Humiliation, Afraid, Rape, and Kick questionnaire    Fear of Current or Ex-Partner: No    Emotionally Abused: No    Physically Abused: No    Sexually Abused: No    Review of Systems   General: Negative for anorexia, weight loss, fever, chills, fatigue, weakness. ENT: Negative for hoarseness, nasal congestion. See hpi CV: Negative for chest pain, angina, palpitations, dyspnea on exertion, peripheral edema.  Respiratory: Negative for dyspnea at rest, dyspnea on exertion, cough, sputum, wheezing.  GI: See history of present illness. GU:  Negative for dysuria, hematuria, urinary incontinence, urinary frequency, nocturnal urination.  Endo: Negative for unusual weight change.     Physical Exam   BP (!) 170/83 (BP Location: Right Arm, Patient Position: Sitting, Cuff Size: Normal)   Pulse 84   Temp 98.4 F (36.9 C) (Oral)   Ht 5\' 1"  (1.549 m)   Wt 162 lb (73.5 kg)   SpO2 100%   BMI 30.61 kg/m    General: Well-nourished, well-developed in no acute distress.  Eyes: No icterus. Mouth: Oropharyngeal mucosa moist and pink   Lungs: Clear to auscultation bilaterally.  Heart: Regular rate and rhythm, no murmurs rubs or gallops.  Abdomen: Bowel sounds are normal,  non distended, no hepatosplenomegaly or masses,  no abdominal bruits or hernia , no rebound or guarding. Mild epigastric pain Rectal: not performed Extremities: No lower extremity edema. No clubbing or deformities. Neuro: Alert and oriented x 4   Skin: Warm and dry, no jaundice.   Psych: Alert and cooperative, normal mood and affect.  Labs   Lab Results  Component Value Date   NA 139 05/06/2023  CL 99 05/06/2023   K 3.6 05/06/2023   CO2 29 05/06/2023   BUN 26 (H) 05/06/2023   CREATININE 1.31 (H) 05/06/2023   GFRNONAA 48  (L) 05/06/2023   CALCIUM 9.2 05/06/2023   ALBUMIN 4.2 04/10/2023   GLUCOSE 210 (H) 05/06/2023   Lab Results  Component Value Date   ALT 20 04/10/2023   AST 18 04/10/2023   ALKPHOS 44 04/10/2023   BILITOT 0.9 04/10/2023   Lab Results  Component Value Date   WBC 6.8 05/06/2023   HGB 12.3 05/06/2023   HCT 38.3 05/06/2023   MCV 95.3 05/06/2023   PLT 370 05/06/2023   No results found for: "VITAMINB12" Lab Results  Component Value Date   HGBA1C 7.9 (H) 04/10/2023    Imaging Studies   No results found.  Assessment   *H/O adenomatous colon polyps *Poor appetite *N/V *Constipation *Pill dysphagia *Unintentional weight loss *Epigastric pain  Overdue for surveillance colonoscopy. Presents to schedule. She has been having a lot of issues with poor appetite, lack of taste since her stroke last year. Denies typical heartburn. She has been having some N/V especially after flavoring her food with hot sauce or vinegar due to her lack of taste. On exam some epigastric pain which is new. I suspect taste sense altered due to stroke. Need to consider atypical reflux, gastritis, PUD, malignancy as possible causes of her UGI symptoms. She may also have esophageal stricture. Constipation likely exacerbated by diminished oral intake.    PLAN   Start pantoprazole 40mg  daily before breakfast. Start lubiprostone BID with food.  Call with worsening abdominal pain.  Colonoscopy/EGD/ED with Dr. Jena Gauss. ASA 3.  I have discussed the risks, alternatives, benefits with regards to but not limited to the risk of reaction to medication, bleeding, infection, perforation and the patient is agreeable to proceed. Written consent to be obtained.   Leanna Battles. Melvyn Neth, MHS, PA-C [facility]

## 2023-07-15 NOTE — Patient Instructions (Signed)
Start pantoprazole 40mg  daily before breakfast for abdominal pain, nausea/vomiting that could be due to gastritis, reflux, or ulcers. Start lubiprostone twice daily with food for constipation. If you have too frequent of stools, reduce dose to once daily. It is important that you get your bowels moving better before upcoming colonoscopy. If medication is not helping, please let me know. Call if worsening abdominal pain. Colonoscopy and upper endoscopy to be scheduled.

## 2023-07-16 ENCOUNTER — Ambulatory Visit: Payer: Medicaid Other | Attending: Neurology | Admitting: Occupational Therapy

## 2023-07-16 DIAGNOSIS — M6281 Muscle weakness (generalized): Secondary | ICD-10-CM | POA: Insufficient documentation

## 2023-07-16 DIAGNOSIS — R29818 Other symptoms and signs involving the nervous system: Secondary | ICD-10-CM | POA: Insufficient documentation

## 2023-07-16 DIAGNOSIS — R278 Other lack of coordination: Secondary | ICD-10-CM | POA: Diagnosis present

## 2023-07-16 DIAGNOSIS — R208 Other disturbances of skin sensation: Secondary | ICD-10-CM | POA: Insufficient documentation

## 2023-07-16 NOTE — Therapy (Addendum)
OUTPATIENT OCCUPATIONAL THERAPY NEURO TREATMENT  Patient Name: Kayla Choi MRN: [id number] DOB:[birth year], 57 y.o., female Today's Date: 07/16/2023  PCP: Ivor Costa, FNP REFERRING PROVIDER: Glean Salvo, NP  END OF SESSION:  OT End of Session - 07/16/23 0807     Visit Number 2    Number of Visits 9    Date for OT Re-Evaluation 08/08/23    Authorization Type [facility] MCD - 5 visits    Authorization Time Period 7/30 - 09/04/23    Authorization - Visit Number 1    Authorization - Number of Visits 5    OT Start Time 0810    OT Stop Time 0855    OT Time Calculation (min) 45 min    Equipment Utilized During Treatment Fluidotherapy, FM objects    Activity Tolerance Patient tolerated treatment well;Other (comment)   Limited by discomfort, tingling, numbness   Behavior During Therapy WFL for tasks assessed/performed             Past Medical History:  Diagnosis Date   Acid reflux    ADD (attention deficit disorder)    Anemia    iron deficinecy   Anxiety    with social phobia   Asthma    Balance problem    Chest pain    that occurs with anxiety   COPD (chronic obstructive pulmonary disease) (HCC)    Depression    Diabetes mellitus    Glaucoma    Hyperlipidemia    Hypertension    Normal cardiac stress test 12/2015   low risk study   Panic attack    Prediabetes    Recurrent falls    Social phobia    Past Surgical History:  Procedure Laterality Date   ABDOMINAL HYSTERECTOMY     fibroids, both ovaries left intact   ANKLE SURGERY     CHOLECYSTECTOMY     COLONOSCOPY N/A 10/18/2019   Procedure: COLONOSCOPY;  Surgeon: Corbin Ade, MD;  Location: [facility];  Service: Endoscopy;  Laterality: N/A;  1:00-office rescheduled to 11/10 @ 12:45pm   COSMETIC SURGERY     elbow   FRACTURE SURGERY     Recurrent elbow surgery   LIPOMA EXCISION  04/30/2012   Procedure: EXCISION LIPOMA;  Surgeon: Fabio Bering, MD;  Location: [facility];  Service: General;   Laterality: Right;  Excision soft tissue mass right thigh   MASS EXCISION Right 12/19/2020   Procedure: EXCISION CYST; 2 CM; AXILLA;  Surgeon: Lucretia Roers, MD;  Location: [facility];  Service: General;  Laterality: Right;   ORIF ANKLE FRACTURE Right 12/15/2017   Procedure: OPEN REDUCTION INTERNAL FIXATION (ORIF) RIGHT ANKLE FRACTURE;  Surgeon: Sheral Apley, MD;  Location: [facility];  Service: Orthopedics;  Laterality: Right;   POLYPECTOMY  10/18/2019   Procedure: POLYPECTOMY;  Surgeon: Corbin Ade, MD;  Location: [facility];  Service: Endoscopy;;  colon   right elbow     reconstruction   Patient Active Problem List   Diagnosis Date Noted   H/O adenomatous polyp of colon 07/15/2023   Constipation 07/15/2023   Esophageal dysphagia 07/15/2023   Epigastric abdominal pain 07/15/2023   N&V (nausea and vomiting) 07/15/2023   Poor appetite 07/15/2023   Stroke (HCC) 06/04/2023   Paresthesia 06/04/2023   Snoring 06/04/2023   Cystitis 05/08/2023   Hypotension 05/06/2023   Lightheadedness 05/06/2023   Blurred vision 05/06/2023   Left hand pain 04/16/2023   Unintentional weight loss 04/16/2023  Left sided numbness 12/01/2022   Class 2 obesity 09/02/2017   Recurrent falls 05/13/2016   Sebaceous cyst of right axilla 03/10/2016   Onychomycosis of toenail 02/28/2015   Knee pain 10/06/2014   Neck muscle spasm 07/02/2014   Migraines 05/12/2014   History of keloid of skin 03/28/2014   Back pain 10/23/2013   Headache 10/05/2012   Inadequate social support 06/25/2012   Microalbuminuria 06/25/2012   Diabetes mellitus, type II (HCC) 04/02/2012   Keloid scar of skin 09/03/2011   COPD (chronic obstructive pulmonary disease) (HCC) 05/21/2007   Hyperlipidemia 01/29/2007   Anxiety state 11/02/2006   MDD (major depressive disorder) 11/02/2006   Essential hypertension 11/02/2006    ONSET DATE: 06/04/2023 (referral date)   REFERRING DIAG: I69.90 (ICD-10-CM) - Late,  effect, cerebrovascular disease I63.81 (ICD-10-CM) - Lacunar stroke (HCC) Note:  To work on left hand, left leg with walking, specifically work on fine [employer] with the left hand  THERAPY DIAG:  Other disturbances of skin sensation  Other lack of coordination  Muscle weakness (generalized)  Other symptoms and signs involving the nervous system  Rationale for Evaluation and Treatment: Rehabilitation  SUBJECTIVE:   SUBJECTIVE STATEMENT: Patient reports ongoing issues with L hand ie) tingling and numbness at rest with L hand hurting when she uses it too.  She reports some tingling moving to the R hand.   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 She is still having left-sided poststroke paresthesias and mild weakness and mild cognitive difficulties 3  PRECAUTIONS: Other: NO lifting over 15 lbs (d/t old Rt elbow fx)   WEIGHT BEARING RESTRICTIONS: No  PAIN:  Are you having pain? Yes: NPRS scale: 7/10 Pain location: fingertips Lt hand (mostly first 3)  Pain description: burning, pins/needles Aggravating factors: using it Relieving factors: rest , pressure, massage  FALLS: Has patient fallen in last 6 months?  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, and Leisure: cooking  PATIENT GOALS: get my hand better, take care of 87 year old granddaughter  OBJECTIVE:   HAND DOMINANCE: Right but did use Lt hand a lot prior to stroke since Rt elbow fx several years  ago  ADLs: Overall ADLs: rest breaks, slower Transfers/ambulation related to ADLs: Eating: mod I w/ Rt hand, but unable to cut food Grooming: mod I w/ Rt hand UB Dressing: mod I  LB Dressing: mod I - slip on shoes Toileting: mod I  Bathing: pt does but reports difficult using Lt hand to get Rt arm (seated) Tub Shower transfers: mod I - has folding chair in tub/shower   IADLs: Shopping: daughter takes pt, pt uses scooter and daughter assist  Light housekeeping: occasionally washes dishes, laundry, boyfriend does most cleaning Meal Prep: mostly microwaveable items, some light stovetop (drinks Ensure for breakfast)  Community mobility: relies on boyfriend or daughter  Medication management: has trouble remembering to take meds Financial management: sometimes to forget Handwriting:  denies change (uses Rt hand)   MOBILITY STATUS: Independent   FUNCTIONAL OUTCOME MEASURES: Upper Extremity Functional Scale (UEFS): 14/80 = 17.5% which  translates to 82.5% deficit  UPPER EXTREMITY ROM:  BUE AROM WNL's  UPPER EXTREMITY MMT:   RUE MMT grossly 5/5, LUE MMT grossly 4/5  HAND FUNCTION: Grip strength: Right: 63.2 lbs; Left: 42.5 lbs  COORDINATION: 9 Hole Peg test: Right: 21.42 sec; Left: 38.64 sec  SENSATION: Light touch: Impaired - fingertips and hand Lt side (especially first 3 fingers)   EDEMA: none at eval, but pt reports swelling in Lt hand at times   COGNITION: Overall cognitive status:  pt suffers from anxiety and depression which can affect cognition - unsure if memory worse since stroke  VISION: Subjective report: pt reports fluctuates - Lt eye bothers her more in the morning Baseline vision:  has glasses but doesn't always wear Visual history:  borderline glaucoma  VISION ASSESSMENT: Not tested   PERCEPTION: Not tested  PRAXIS: Not tested  OBSERVATIONS at Eval: Pt has had previous outpatient O.T. for stroke (3 months) at [facility] from Feb - March 2024. Pt  limited by decreased motivation and depression/anxiety   TODAY'S TREATMENT:                                                                                                                               Therapeutic Activities:  Pt placed BUE in Fluidotherapy machine with supervised ROM and sensory stimulation x 10 min. Pt was educated to complete gentle AROM during modality time to decrease pain/stiffness of affected extremity by use of the machine's massaging action and thermal properties and prepare her to work on [employer] tasks.  During fluidotherapy, OTR introduced sensory stimulation for desensitization ie) using various textures to help with stimulation of her L hand/fingers that are numb and tingling ie) with different cloth materials ie) washcloth, granddaughter's clothes etc.  She is encouraged to her hand with different textures and between her hands to work on comparing the 'feel' to her R side. She is encouraged to use visualization, verbal feedback to help with awareness of different features of objects ie) soft, hard, fuzzy etc. Further education necessary with handouts in future sessions.    Introduced and practiced various higher level coordination activities as listed below:  Rotate ball in fingertips (clockwise and counter-clockwise and flipping). Flip cards 1 at a time as fast as you can. Deal cards with your thumb (Hold deck in hand and push card off top with thumb). Rotate card in hand (clockwise and counter-clockwise). Shuffle cards - (with difficulty and then encouraged to spread them over the table top and mix them up). Pick up coins, buttons, marbles, dried beans/pasta of different sizes and place in container. Pick up coins and place in container or coin bank. Pick up coins and stack. Pick up coins one at a time and storing items in hand, until you get 5-10 in your hand, and then translating stored items to tips of thumb and index finger before placing them individually into a  container. Offered option of placing them on  a washcloth to make it easier to pick them up. Twirl pen between fingers.    Patient able to return demonstrate each of the activities. Handout/list provided.  Recommended performing the following activities for 5-10 minutes 2-3 times per day with left hand(s) (use the right hand too as an example).  Patient reports having pink putty from previous therapy intervention and is verbally encouraged to continue to work on putty squeezing and rolling putty on table top. Further education necessary with handouts in future sessions.   Also discussed and showed (via online search) a couple of AE considerations due to difficulties she reports ie) cutting (right angle knife or rocker knife) and bathing (loofah or long handled loofah versus asking her daughter to wash under arm and back).  PATIENT EDUCATION: Education details: Psychiatrist (handout) & info re: desensitization activities (verbal) Person educated: Patient Education method: Explanation, Demonstration, Tactile cues, Verbal cues, and Handouts Education comprehension: verbalized understanding, returned demonstration, verbal cues required, tactile cues required, and needs further education  HOME EXERCISE PROGRAM: 07/16/23: Coordination Activities (written list)   GOALS: Goals reviewed with patient? Yes   LONG TERM GOALS: Target date: 08/08/23  Independent with Lt hand coordination and putty HEP  Baseline: Not yet addressed Goal status: IN Progress  2.  Pt to improve coordination as evidenced by reducing speed on 9 hole peg test to 33 sec. Or less Baseline: 38 sec Goal status: IN Progress  3.  Improve Lt grip strength to 47 lbs or greater to assist with opening jars Baseline: 42.5 lbs Goal status: IN Progress  4.  UEFI scale to show less than 75% deficits  Baseline: 82.5%  Goal status: INITIAL  5.  Pt to verbalize understanding with safety considerations and pain management  strategies for sensory loss and pain Lt hand Baseline: not yet addressed Goal status: IN Progress  6.  Pt to verbalize understanding with task modifications and A/E to increase safety and independence (tub bench, rocker knife, kitchen items)  Baseline: not yet addressed Goal status: IN Progress  ASSESSMENT:  CLINICAL IMPRESSION: Patient is a 57 y.o. female who was seen today for occupational therapy treatment for remaining Lt hand deficits in strength and coordination, as well as sensory changes in Lt hand and decreased ability to perform ADLs safely from stroke in December 2023. Pt trialed with Fluidotherapy today without much immediate benefit or changes noted - will follow up in future sessions. Introduced coordination activities with patient encouraged to work on various activities throughout the week.   PERFORMANCE DEFICITS: in functional skills including ADLs, IADLs, coordination, dexterity, sensation, strength, pain, Fine motor control, mobility, body mechanics, endurance, decreased knowledge of use of DME, vision, and UE functional use, cognitive skills including memory and safety awareness, and psychosocial skills including coping strategies.   IMPAIRMENTS: are limiting patient from ADLs, IADLs, rest and sleep, leisure, and social participation.   CO-MORBIDITIES: has co-morbidities such as anxiety/depression  that affects occupational performance. Patient will benefit from skilled OT to address above impairments and improve overall function.  REHAB POTENTIAL: Fair time since stroke, depression/anxiety  PLAN:  OT FREQUENCY: 2x/week  OT DURATION: 6 weeks, however pt has already used 19 MCD visits earlier this year and will probably only be approved for 8 visits  PLANNED INTERVENTIONS: self care/ADL training, therapeutic exercise, therapeutic activity, neuromuscular re-education, manual therapy, functional mobility training, fluidotherapy, patient/family education, cognitive  remediation/compensation, energy conservation, coping strategies training, and DME and/or AE instructions  RECOMMENDED OTHER SERVICES: none at this time  CONSULTED AND AGREED WITH PLAN OF CARE: Patient  PLAN FOR NEXT SESSION:   Check re: effectiveness of another trial of fluidotherapy for pain and desensitization,  Review/progress as needed HEP for Lt hand coordination (high level)  Provide handouts/education for putty HEP with potential upgrade to green putty Provided desensitization handouts and sensory safety education   Victorino Sparrow, OT 07/16/2023, 9:20 AM

## 2023-07-16 NOTE — Patient Instructions (Signed)
Coordination Activities  Perform the following activities for 5-10 minutes 2-3 times per day with left hand(s) (use the right hand too as an example)  Rotate ball in fingertips (clockwise and counter-clockwise). Flip cards 1 at a time as fast as you can. Deal cards with your thumb (Hold deck in hand and push card off top with thumb). Rotate card in hand (clockwise and counter-clockwise). Shuffle cards. Pick up coins, buttons, marbles, dried beans/pasta of different sizes and place in container. Pick up coins and place in container or coin bank. Pick up coins and stack. Pick up coins one at a time until you get 5-10 in your hand, then move coins from palm to fingertips to stack one at a time. Twirl pen between fingers.

## 2023-07-20 ENCOUNTER — Ambulatory Visit: Payer: Medicaid Other | Admitting: Occupational Therapy

## 2023-07-20 DIAGNOSIS — R208 Other disturbances of skin sensation: Secondary | ICD-10-CM

## 2023-07-20 DIAGNOSIS — R278 Other lack of coordination: Secondary | ICD-10-CM

## 2023-07-20 DIAGNOSIS — M6281 Muscle weakness (generalized): Secondary | ICD-10-CM

## 2023-07-20 NOTE — Therapy (Unsigned)
OUTPATIENT OCCUPATIONAL THERAPY NEURO TREATMENT  Patient Name: Kayla Choi MRN: [id number] DOB:04/15/1966, 57 y.o., female Today's Date: 07/20/2023  PCP: Ivor Costa, FNP REFERRING PROVIDER: Glean Salvo, NP  END OF SESSION:  OT End of Session - 07/20/23 1453     Visit Number 3    Number of Visits 9    Date for OT Re-Evaluation 08/08/23    Authorization Type [facility] MCD - 5 visits    Authorization Time Period 7/30 - 09/04/23    Authorization - Number of Visits 5    OT Start Time 1450    OT Stop Time 1533    OT Time Calculation (min) 43 min    Equipment Utilized During Treatment compression glove    Activity Tolerance Patient tolerated treatment well    Behavior During Therapy WFL for tasks assessed/performed             Past Medical History:  Diagnosis Date   Acid reflux    ADD (attention deficit disorder)    Anemia    iron deficinecy   Anxiety    with social phobia   Asthma    Balance problem    Chest pain    that occurs with anxiety   COPD (chronic obstructive pulmonary disease) (HCC)    Depression    Diabetes mellitus    Glaucoma    Hyperlipidemia    Hypertension    Normal cardiac stress test 12/2015   low risk study   Panic attack    Prediabetes    Recurrent falls    Social phobia    Past Surgical History:  Procedure Laterality Date   ABDOMINAL HYSTERECTOMY     fibroids, both ovaries left intact   ANKLE SURGERY     CHOLECYSTECTOMY     COLONOSCOPY N/A 10/18/2019   Procedure: COLONOSCOPY;  Surgeon: Corbin Ade, MD;  Location: [facility];  Service: Endoscopy;  Laterality: N/A;  1:00-office rescheduled to 11/10 @ 12:45pm   COSMETIC SURGERY     elbow   FRACTURE SURGERY     Recurrent elbow surgery   LIPOMA EXCISION  04/30/2012   Procedure: EXCISION LIPOMA;  Surgeon: Fabio Bering, MD;  Location: [facility];  Service: General;  Laterality: Right;  Excision soft tissue mass right thigh   MASS EXCISION Right 12/19/2020   Procedure:  EXCISION CYST; 2 CM; AXILLA;  Surgeon: Lucretia Roers, MD;  Location: [facility];  Service: General;  Laterality: Right;   ORIF ANKLE FRACTURE Right 12/15/2017   Procedure: OPEN REDUCTION INTERNAL FIXATION (ORIF) RIGHT ANKLE FRACTURE;  Surgeon: Sheral Apley, MD;  Location: [facility];  Service: Orthopedics;  Laterality: Right;   POLYPECTOMY  10/18/2019   Procedure: POLYPECTOMY;  Surgeon: Corbin Ade, MD;  Location: [facility];  Service: Endoscopy;;  colon   right elbow     reconstruction   Patient Active Problem List   Diagnosis Date Noted   H/O adenomatous polyp of colon 07/15/2023   Constipation 07/15/2023   Esophageal dysphagia 07/15/2023   Epigastric abdominal pain 07/15/2023   N&V (nausea and vomiting) 07/15/2023   Poor appetite 07/15/2023   Stroke (HCC) 06/04/2023   Paresthesia 06/04/2023   Snoring 06/04/2023   Cystitis 05/08/2023   Hypotension 05/06/2023   Lightheadedness 05/06/2023   Blurred vision 05/06/2023   Left hand pain 04/16/2023   Unintentional weight loss 04/16/2023   Left sided numbness 12/01/2022   Class 2 obesity 09/02/2017   Recurrent falls 05/13/2016  Sebaceous cyst of right axilla 03/10/2016   Onychomycosis of toenail 02/28/2015   Knee pain 10/06/2014   Neck muscle spasm 07/02/2014   Migraines 05/12/2014   History of keloid of skin 03/28/2014   Back pain 10/23/2013   Headache 10/05/2012   Inadequate social support 06/25/2012   Microalbuminuria 06/25/2012   Diabetes mellitus, type II (HCC) 04/02/2012   Keloid scar of skin 09/03/2011   COPD (chronic obstructive pulmonary disease) (HCC) 05/21/2007   Hyperlipidemia 01/29/2007   Anxiety state 11/02/2006   MDD (major depressive disorder) 11/02/2006   Essential hypertension 11/02/2006    ONSET DATE: 06/04/2023 (referral date)   REFERRING DIAG: I69.90 (ICD-10-CM) - Late, effect, cerebrovascular disease I63.81 (ICD-10-CM) - Lacunar stroke (HCC) Note:  To work on left hand,  left leg with walking, specifically work on fine [employer] with the left hand  THERAPY DIAG:  Other disturbances of skin sensation  Other lack of coordination  Muscle weakness (generalized)  Rationale for Evaluation and Treatment: Rehabilitation  SUBJECTIVE:   SUBJECTIVE STATEMENT: Patient reports improvements with L hand tingling and numbness when she applies some pressure to the thumb/thenar eminence.     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 She is still having left-sided poststroke paresthesias and mild weakness and mild cognitive difficulties 3  PRECAUTIONS: Other: NO lifting over 15 lbs (d/t old Rt elbow fx)   WEIGHT BEARING RESTRICTIONS: No  PAIN:  Are you having pain? No, pressure through thenar eminence   FALLS: Has patient fallen in last 6 months?  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, and Leisure: cooking  PATIENT GOALS: get my hand better, take care of 35 year old granddaughter  OBJECTIVE:   HAND DOMINANCE: Right but did use Lt hand a lot prior to stroke since Rt elbow fx several years ago  ADLs: Overall ADLs: rest breaks, slower Transfers/ambulation related to ADLs: Eating: mod I w/ Rt hand, but unable to cut food Grooming: mod I w/ Rt hand UB Dressing: mod I  LB Dressing: mod I - slip on shoes Toileting: mod I  Bathing: pt does but reports difficult using Lt hand to get Rt arm (seated) Tub Shower transfers: mod I - has folding chair in  tub/shower   IADLs: Shopping: daughter takes pt, pt uses scooter and daughter assist  Light housekeeping: occasionally washes dishes, laundry, boyfriend does most cleaning Meal Prep: mostly microwaveable items, some light stovetop (drinks Ensure for breakfast)  Community mobility: relies on boyfriend or daughter  Medication management: has trouble remembering to take meds Financial management: sometimes to forget Handwriting:  denies change (uses Rt hand)   MOBILITY STATUS: Independent   FUNCTIONAL OUTCOME MEASURES: Upper Extremity Functional Scale (UEFS): 14/80 = 17.5% which translates to 82.5% deficit  UPPER EXTREMITY ROM:  BUE AROM WNL's  UPPER EXTREMITY MMT:   RUE MMT grossly 5/5, LUE MMT grossly 4/5  HAND FUNCTION: Grip strength: Right: 63.2 lbs; Left: 42.5 lbs  COORDINATION: 9 Hole Peg test: Right: 21.42 sec; Left: 38.64 sec  SENSATION: Light touch: Impaired - fingertips and hand Lt  side (especially first 3 fingers)   EDEMA: none at eval, but pt reports swelling in Lt hand at times   COGNITION: Overall cognitive status:  pt suffers from anxiety and depression which can affect cognition - unsure if memory worse since stroke  VISION: Subjective report: pt reports fluctuates - Lt eye bothers her more in the morning Baseline vision:  has glasses but doesn't always wear Visual history:  borderline glaucoma  VISION ASSESSMENT: Not tested   PERCEPTION: Not tested  PRAXIS: Not tested  OBSERVATIONS at Eval: Pt has had previous outpatient O.T. for stroke (3 months) at [facility] from Feb - March 2024. Pt limited by decreased motivation and depression/anxiety   TODAY'S TREATMENT:                                                                                                                               Therapeutic Exercises: reviewed putty for deep pressure and strengthening  Self Care - Joint protection  Manual techniques: Minimize hand tingling nad numbness  with  Pt placed BUE in Fluidotherapy machine with supervised ROM and sensory stimulation x 10 min. Pt was educated to complete gentle AROM during modality time to decrease pain/stiffness of affected extremity by use of the machine's massaging action and thermal properties and prepare her to work on [employer] tasks.  During fluidotherapy, OTR introduced sensory stimulation for desensitization ie) using various textures to help with stimulation of her L hand/fingers that are numb and tingling ie) with different cloth materials ie) washcloth, granddaughter's clothes etc.  She is encouraged to her hand with different textures and between her hands to work on comparing the 'feel' to her R side. She is encouraged to use visualization, verbal feedback to help with awareness of different features of objects ie) soft, hard, fuzzy etc. Further education necessary with handouts in future sessions.    Introduced and practiced various higher level coordination activities as listed below:  Rotate ball in fingertips (clockwise and counter-clockwise and flipping). Flip cards 1 at a time as fast as you can. Deal cards with your thumb (Hold deck in hand and push card off top with thumb). Rotate card in hand (clockwise and counter-clockwise). Shuffle cards - (with difficulty and then encouraged to spread them over the table top and mix them up). Pick up coins, buttons, marbles, dried beans/pasta of different sizes and place in container. Pick up coins and place in container or coin bank. Pick up coins and stack. Pick up coins one at a time and storing items in hand, until you get 5-10 in your hand, and then translating stored items to tips of thumb and index finger before placing them individually into a container. Offered option of placing them on a washcloth to make it easier to pick them up. Twirl pen between fingers.    Patient able to return demonstrate each of the activities. Handout/list provided.  Recommended  performing the following activities for 5-10 minutes  2-3 times per day with left hand(s) (use the right hand too as an example).  Patient reports having pink putty from previous therapy intervention and is verbally encouraged to continue to work on putty squeezing and rolling putty on table top. Further education necessary with handouts in future sessions.   Also discussed and showed (via online search) a couple of AE considerations due to difficulties she reports ie) cutting (right angle knife or rocker knife) and bathing (loofah or long handled loofah versus asking her daughter to wash under arm and back).  PATIENT EDUCATION: Education details: Psychiatrist (handout) & info re: desensitization activities (verbal) Person educated: Patient Education method: Explanation, Demonstration, Tactile cues, Verbal cues, and Handouts Education comprehension: verbalized understanding, returned demonstration, verbal cues required, tactile cues required, and needs further education  HOME EXERCISE PROGRAM: 07/16/23: Coordination Activities (written list)   GOALS: Goals reviewed with patient? Yes   LONG TERM GOALS: Target date: 08/08/23  Independent with Lt hand coordination and putty HEP  Baseline: Not yet addressed Goal status: IN Progress  2.  Pt to improve coordination as evidenced by reducing speed on 9 hole peg test to 33 sec. Or less Baseline: 38 sec Goal status: IN Progress  3.  Improve Lt grip strength to 47 lbs or greater to assist with opening jars Baseline: 42.5 lbs Goal status: IN Progress  4.  UEFI scale to show less than 75% deficits  Baseline: 82.5%  Goal status: INITIAL  5.  Pt to verbalize understanding with safety considerations and pain management strategies for sensory loss and pain Lt hand Baseline: not yet addressed Goal status: IN Progress  6.  Pt to verbalize understanding with task modifications and A/E to increase safety and independence (tub bench,  rocker knife, kitchen items)  Baseline: not yet addressed Goal status: IN Progress  ASSESSMENT:  CLINICAL IMPRESSION: Patient is a 57 y.o. female who was seen today for occupational therapy treatment for remaining Lt hand deficits in strength and coordination, as well as sensory changes in Lt hand and decreased ability to perform ADLs safely from stroke in December 2023. Pt trialed with Fluidotherapy today without much immediate benefit or changes noted - will follow up in future sessions. Introduced coordination activities with patient encouraged to work on various activities throughout the week.   PERFORMANCE DEFICITS: in functional skills including ADLs, IADLs, coordination, dexterity, sensation, strength, pain, Fine motor control, mobility, body mechanics, endurance, decreased knowledge of use of DME, vision, and UE functional use, cognitive skills including memory and safety awareness, and psychosocial skills including coping strategies.   IMPAIRMENTS: are limiting patient from ADLs, IADLs, rest and sleep, leisure, and social participation.   CO-MORBIDITIES: has co-morbidities such as anxiety/depression  that affects occupational performance. Patient will benefit from skilled OT to address above impairments and improve overall function.  REHAB POTENTIAL: Fair time since stroke, depression/anxiety  PLAN:  OT FREQUENCY: 2x/week  OT DURATION: 6 weeks, however pt has already used 19 MCD visits earlier this year and will probably only be approved for 8 visits  PLANNED INTERVENTIONS: self care/ADL training, therapeutic exercise, therapeutic activity, neuromuscular re-education, manual therapy, functional mobility training, fluidotherapy, patient/family education, cognitive remediation/compensation, energy conservation, coping strategies training, and DME and/or AE instructions  RECOMMENDED OTHER SERVICES: none at this time  CONSULTED AND AGREED WITH PLAN OF CARE: Patient  PLAN FOR NEXT  SESSION:   Review/progress as needed HEP for Lt hand coordination (high level)  Provide handouts/education for putty HEP with potential upgrade to green  putty Provided desensitization handouts and sensory safety education   Victorino Sparrow, OT 07/20/2023, 5:19 PM

## 2023-07-20 NOTE — Patient Instructions (Addendum)
   Home Exercises Program Theraputty Exercises    Do the following exercises 2 times a day using your affected hand.   1. Roll putty into a ball.   2. Make into a pancake.   3. Roll putty into a roll.   4. Pinch along log with first finger and thumb.    5. Make into a ball.   6. Roll it back into a log.    7. Pinch using thumb and side of first finger.   8. Roll into a ball, then flatten into a pancake.   9. Using your fingers, make putty into a mountain.  10. Hide things in the putty to find (marble, rice, pennies etc).   11. Roll putty back into a ball and squeeze gently for 2-3 minutes.

## 2023-07-21 ENCOUNTER — Telehealth: Payer: Self-pay | Admitting: *Deleted

## 2023-07-21 NOTE — Telephone Encounter (Signed)
LMOVM TO CALL BACK TO SCHEDULE TCS/EGD +/-ED WITH PROPOFOL ASA 3. See encounter for instructions

## 2023-07-22 ENCOUNTER — Ambulatory Visit: Payer: Medicaid Other | Admitting: Occupational Therapy

## 2023-07-22 DIAGNOSIS — R208 Other disturbances of skin sensation: Secondary | ICD-10-CM | POA: Diagnosis not present

## 2023-07-22 DIAGNOSIS — M6281 Muscle weakness (generalized): Secondary | ICD-10-CM

## 2023-07-22 DIAGNOSIS — R278 Other lack of coordination: Secondary | ICD-10-CM

## 2023-07-22 DIAGNOSIS — R29818 Other symptoms and signs involving the nervous system: Secondary | ICD-10-CM

## 2023-07-22 NOTE — Therapy (Addendum)
OUTPATIENT OCCUPATIONAL THERAPY NEURO TREATMENT  Patient Name: Kayla Choi MRN: [id number] DOB:04-23-1966, 57 y.o., female Today's Date: 07/22/2023  PCP: Ivor Costa, FNP REFERRING PROVIDER: Glean Salvo, NP  END OF SESSION:  OT End of Session - 07/22/23 1451     Visit Number 4    Number of Visits 9    Date for OT Re-Evaluation 08/08/23    Authorization Type [facility] MCD - 5 visits    Authorization Time Period 7/30 - 09/04/23    Authorization - Number of Visits 5    OT Start Time 1451    OT Stop Time 1529    OT Time Calculation (min) 38 min    Equipment Utilized During Treatment --    Activity Tolerance Patient tolerated treatment well    Behavior During Therapy WFL for tasks assessed/performed              Past Medical History:  Diagnosis Date   Acid reflux    ADD (attention deficit disorder)    Anemia    iron deficinecy   Anxiety    with social phobia   Asthma    Balance problem    Chest pain    that occurs with anxiety   COPD (chronic obstructive pulmonary disease) (HCC)    Depression    Diabetes mellitus    Glaucoma    Hyperlipidemia    Hypertension    Normal cardiac stress test 12/2015   low risk study   Panic attack    Prediabetes    Recurrent falls    Social phobia    Past Surgical History:  Procedure Laterality Date   ABDOMINAL HYSTERECTOMY     fibroids, both ovaries left intact   ANKLE SURGERY     CHOLECYSTECTOMY     COLONOSCOPY N/A 10/18/2019   Procedure: COLONOSCOPY;  Surgeon: Corbin Ade, MD;  [facility];  Service: Endoscopy;  Laterality: N/A;  1:00-office rescheduled to 11/10 @ 12:45pm   COSMETIC SURGERY     elbow   FRACTURE SURGERY     Recurrent elbow surgery   LIPOMA EXCISION  04/30/2012   Procedure: EXCISION LIPOMA;  Surgeon: Fabio Bering, MD;  Location: [facility];  Service: General;  Laterality: Right;  Excision soft tissue mass right thigh   MASS EXCISION Right 12/19/2020   Procedure: EXCISION CYST; 2  CM; AXILLA;  Surgeon: Lucretia Roers, MD;  Location: [facility];  Service: General;  Laterality: Right;   ORIF ANKLE FRACTURE Right 12/15/2017   Procedure: OPEN REDUCTION INTERNAL FIXATION (ORIF) RIGHT ANKLE FRACTURE;  Surgeon: Sheral Apley, MD;  Location: [facility];  Service: Orthopedics;  Laterality: Right;   POLYPECTOMY  10/18/2019   Procedure: POLYPECTOMY;  Surgeon: Corbin Ade, MD;  [facility];  Service: Endoscopy;;  colon   right elbow     reconstruction   Patient Active Problem List   Diagnosis Date Noted   H/O adenomatous polyp of colon 07/15/2023   Constipation 07/15/2023   Esophageal dysphagia 07/15/2023   Epigastric abdominal pain 07/15/2023   N&V (nausea and vomiting) 07/15/2023   Poor appetite 07/15/2023   Stroke (HCC) 06/04/2023   Paresthesia 06/04/2023   Snoring 06/04/2023   Cystitis 05/08/2023   Hypotension 05/06/2023   Lightheadedness 05/06/2023   Blurred vision 05/06/2023   Left hand pain 04/16/2023   Unintentional weight loss 04/16/2023   Left sided numbness 12/01/2022   Class 2 obesity 09/02/2017   Recurrent falls 05/13/2016  Sebaceous cyst of right axilla 03/10/2016   Onychomycosis of toenail 02/28/2015   Knee pain 10/06/2014   Neck muscle spasm 07/02/2014   Migraines 05/12/2014   History of keloid of skin 03/28/2014   Back pain 10/23/2013   Headache 10/05/2012   Inadequate social support 06/25/2012   Microalbuminuria 06/25/2012   Diabetes mellitus, type II (HCC) 04/02/2012   Keloid scar of skin 09/03/2011   COPD (chronic obstructive pulmonary disease) (HCC) 05/21/2007   Hyperlipidemia 01/29/2007   Anxiety state 11/02/2006   MDD (major depressive disorder) 11/02/2006   Essential hypertension 11/02/2006    ONSET DATE: 06/04/2023 (referral date)   REFERRING DIAG: I69.90 (ICD-10-CM) - Late, effect, cerebrovascular disease I63.81 (ICD-10-CM) - Lacunar stroke (HCC) Note:  To work on left hand, left leg with  walking, 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 She is still having left-sided poststroke paresthesias and mild weakness and mild cognitive difficulties 3  PRECAUTIONS: Other: NO lifting over 15 lbs (d/t old Rt elbow fx)   WEIGHT BEARING RESTRICTIONS: No  PAIN:  Are you having pain? No  FALLS: Has patient fallen in last 6 months?  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, and Leisure: cooking  PATIENT GOALS: get my hand better, take care of 26 year old granddaughter  OBJECTIVE:   HAND DOMINANCE: Right but did use Lt hand a lot prior to stroke since Rt elbow fx several years ago  ADLs: Overall ADLs: rest breaks, slower Transfers/ambulation related to ADLs: Eating: mod I w/ Rt hand, but unable to cut food Grooming: mod I w/ Rt hand UB Dressing: mod I  LB Dressing: mod I - slip on shoes Toileting: mod I  Bathing: pt does but reports difficult using Lt hand to get Rt arm (seated) Tub Shower transfers: mod I - has folding chair in tub/shower   IADLs: Shopping: daughter takes pt, pt uses scooter and daughter  assist  Light housekeeping: occasionally washes dishes, laundry, boyfriend does most cleaning Meal Prep: mostly microwaveable items, some light stovetop (drinks Ensure for breakfast)  Community mobility: relies on boyfriend or daughter  Medication management: has trouble remembering to take meds Financial management: sometimes to forget Handwriting:  denies change (uses Rt hand)   MOBILITY STATUS: Independent   FUNCTIONAL OUTCOME MEASURES: Upper Extremity Functional Scale (UEFS): 14/80 = 17.5% which translates to 82.5% deficit  UPPER EXTREMITY ROM:  BUE AROM WNL's  UPPER EXTREMITY MMT:   RUE MMT grossly 5/5, LUE MMT grossly 4/5  HAND FUNCTION: Grip strength: Right: 63.2 lbs; Left: 42.5 lbs  COORDINATION: 9 Hole Peg test: Right: 21.42 sec; Left: 38.64 sec  SENSATION: Light touch: Impaired - fingertips and hand Lt side (especially first 3 fingers)   EDEMA: none  at eval, but pt reports swelling in Lt hand at times   COGNITION: Overall cognitive status:  pt suffers from anxiety and depression which can affect cognition - unsure if memory worse since stroke  VISION: Subjective report: pt reports fluctuates - Lt eye bothers her more in the morning Baseline vision:  has glasses but doesn't always wear Visual history:  borderline glaucoma  VISION ASSESSMENT: Not tested   PERCEPTION: Not tested  PRAXIS: Not tested  OBSERVATIONS at Eval: Pt has had previous outpatient O.T. for stroke (3 months) at [facility] from Feb - March 2024. Pt limited by decreased motivation and depression/anxiety   TODAY'S TREATMENT:                 - Therapeutic activities completed for duration as noted below including:                                                                                                                Reviewed sensory precautions and pain management. Pt requiring mod cueing for recall.   OT progressed putty HEP (as noted in pt instructions) to green for increased  challenge to L hand strength.   With use of L, pt placed and then removed colored, small pegs into corresponding hole with use of pattern sheets for ROM, coordination, and strength of affected extremity. Pt picked up 3 pegs, stored them in hand, and then placed pegs one at a time using 2 point pinch for additional fine motor challenge.  Pt completed pick up, store, place, shift, translate, and stack of 4 checkers to improve L fine motor coordination and in hand manipulation.   Pt completed simulated cutting of food with use of green and red putty. Fork adapted with foam tubing to increase accuracy.   Pt completed peg press into red putty for proprioceptive input through L hand and fine motor coordination as needed to retrieve pegs from cap.   PATIENT EDUCATION: Education details: Putty (progressed to green); cutting; foam tubing  Person educated: Patient Education method: Explanation, Demonstration, Tactile cues, and Verbal cues Education comprehension: verbalized understanding, returned demonstration, verbal cues required, tactile cues required, and needs further education  HOME EXERCISE PROGRAM: 07/16/23: Coordination Activities (written list) 07/21/23: Putty Exercises (reprinted from previous OT handout)   GOALS: Goals reviewed with patient? Yes   LONG TERM GOALS: Target date: 08/08/23  Independent with Lt hand coordination and putty HEP  Baseline: Not yet addressed Goal status: IN Progress  2.  Pt to improve coordination as evidenced by reducing speed on 9 hole peg test to 33 sec. Or less Baseline: 38 sec Goal status: IN Progress  3.  Improve Lt grip strength to 47 lbs or greater to assist with opening jars Baseline: 42.5 lbs Goal status: IN Progress  4.  UEFI scale to show less than 75% deficits  Baseline: 82.5%  Goal status: INITIAL  5.  Pt to verbalize understanding with safety considerations and pain management strategies for sensory loss and pain Lt hand Baseline: not  yet  addressed Goal status: IN Progress  6.  Pt to verbalize understanding with task modifications and A/E to increase safety and independence (tub bench, rocker knife, kitchen items)  Baseline: not yet addressed Goal status: IN Progress  ASSESSMENT:  CLINICAL IMPRESSION: Pt appears to be progressing well towards goals but remains limited by sensation and coordination in LUE. She will need to be d/c due to insurance visit limits  PERFORMANCE DEFICITS: in functional skills including ADLs, IADLs, coordination, dexterity, sensation, strength, pain, Fine motor control, mobility, body mechanics, endurance, decreased knowledge of use of DME, vision, and UE functional use, cognitive skills including memory and safety awareness, and psychosocial skills including coping strategies.   IMPAIRMENTS: are limiting patient from ADLs, IADLs, rest and sleep, leisure, and social participation.   CO-MORBIDITIES: has co-morbidities such as anxiety/depression  that affects occupational performance. Patient will benefit from skilled OT to address above impairments and improve overall function.  REHAB POTENTIAL: Fair time since stroke, depression/anxiety  PLAN:  OT FREQUENCY: 2x/week  OT DURATION: 6 weeks, however pt has already used 19 MCD visits earlier this year and will probably only be approved for 8 visits  PLANNED INTERVENTIONS: self care/ADL training, therapeutic exercise, therapeutic activity, neuromuscular re-education, manual therapy, functional mobility training, fluidotherapy, patient/family education, cognitive remediation/compensation, energy conservation, coping strategies training, and DME and/or AE instructions  RECOMMENDED OTHER SERVICES: none at this time  CONSULTED AND AGREED WITH PLAN OF CARE: Patient  PLAN FOR NEXT SESSION: TWO REMAINING OT VISITS AFTER TODAY  Review/progress as needed HEP for Lt hand coordination (high level) & putty HEP with potential upgrade to green  putty  Provided desensitization handouts and sensory safety education   Delana Meyer, OT 07/22/2023, 4:15 PM

## 2023-07-24 ENCOUNTER — Ambulatory Visit: Payer: Medicaid Other | Admitting: Family Medicine

## 2023-07-27 ENCOUNTER — Ambulatory Visit: Payer: Medicaid Other | Admitting: Family Medicine

## 2023-07-27 ENCOUNTER — Ambulatory Visit: Payer: Medicaid Other | Admitting: Occupational Therapy

## 2023-07-27 VITALS — BP 145/79 | HR 105 | Resp 16 | Ht 61.0 in | Wt 159.0 lb

## 2023-07-27 DIAGNOSIS — E782 Mixed hyperlipidemia: Secondary | ICD-10-CM

## 2023-07-27 DIAGNOSIS — I10 Essential (primary) hypertension: Secondary | ICD-10-CM

## 2023-07-27 DIAGNOSIS — E538 Deficiency of other specified B group vitamins: Secondary | ICD-10-CM

## 2023-07-27 DIAGNOSIS — F339 Major depressive disorder, recurrent, unspecified: Secondary | ICD-10-CM

## 2023-07-27 DIAGNOSIS — E119 Type 2 diabetes mellitus without complications: Secondary | ICD-10-CM

## 2023-07-27 DIAGNOSIS — Z794 Long term (current) use of insulin: Secondary | ICD-10-CM

## 2023-07-27 MED ORDER — SITAGLIPTIN PHOSPHATE 25 MG PO TABS: 25.0000 mg | ORAL_TABLET | Freq: Every day | ORAL | 1 refills | Status: DC

## 2023-07-27 MED ORDER — MIRTAZAPINE 7.5 MG PO TABS
7.5000 mg | ORAL_TABLET | Freq: Every day | ORAL | 3 refills | Status: DC
Start: 2023-07-27 — End: 2023-10-30

## 2023-07-27 MED ORDER — LISINOPRIL-HYDROCHLOROTHIAZIDE 20-12.5 MG PO TABS: 1.0000 | ORAL_TABLET | Freq: Every day | ORAL | 3 refills | Status: DC

## 2023-07-27 MED ORDER — GLIMEPIRIDE 2 MG PO TABS: 2.0000 mg | ORAL_TABLET | Freq: Every day | ORAL | 1 refills | Status: DC

## 2023-07-27 NOTE — Assessment & Plan Note (Signed)
Last Hemoglobin A1c  7.9, Labs ordered today awaiting results will follow up. Patient reports stop taking insulin a few weeks ago and does not want to continue. She is interested  in oral diabetic regimen, has had metformin before with adverse effects.  Discussed medication desired effects, potential side effects, and how to administer the medication. Discussed nonpharmacologic interventions such as low carb diet,high in protein, vegetables and fruit discussed. Educated on importance of physical activity 150 minutes per week. Discussed signs and symptoms of hypoglycemia, & hyperglycemia and need to present to the ED if symptoms occurs.Follow up in 3 months or sooner if needed. Patient verbalizes understanding regarding plan of care and all questions answered. Ophthalmology referral placed , Foot exam within desired limits  Started Glimepiride 2 mg and Januvia 25 mg - advise the importance medication compliance

## 2023-07-27 NOTE — Patient Instructions (Signed)

## 2023-07-27 NOTE — Assessment & Plan Note (Addendum)
Vitals:   07/27/23 1453 07/27/23 1500  BP: (!) 170/98 (!) 145/79  Patient reported did not take blood pressure medication today , because she was taking care of her grandchildren. Continue Lisinopril-hydrochlorothiazide 20-12.5 and amlodipine 10 mg Follow up in 1 week with at home blood pressure reading Advise the importance of medication compliance  Continued discussion on DASH diet, low sodium diet and maintain a exercise routine for 150 minutes per week.

## 2023-07-27 NOTE — Assessment & Plan Note (Signed)
Flowsheet Row Office Visit from 07/27/2023 in [facility]  PHQ-9 Total Score 10      Depression with Insomnia, loss of appetite Trial on Remeron 7.5 mg at bedtime Discussed about cognitive behavioral therapy focusing on thoughts, belief, and attitudes that affects feelings and behavior, learning about coping skills to deal with certain problems. Maintaining a consistent routine and schedule, Practice stress management and self calming techniques, excersise regularly and spend time outdoors, Do not eat food that are high in fat, added sugar, or salt. Referral to behavior health    Patient verbally consented to [facility] services about presenting concerns and psychiatric consultation as appropriate. The services will be billed as appropriate for the patient.

## 2023-07-27 NOTE — Progress Notes (Signed)
Patient Office Visit   Subjective   Patient ID: Kayla Choi, female    DOB: [birth year]  Age: 57 y.o. MRN: [id number]  CC:  Chief Complaint  Patient presents with   Follow-up    3 month follow up HTN     HPI Kayla Choi 57 year old female, presents to the clinic for chronic follow up. She  has a past medical history of Acid reflux, ADD (attention deficit disorder), Anemia, Anxiety, Asthma, Balance problem, Chest pain, COPD (chronic obstructive pulmonary disease) (HCC), Depression, Diabetes mellitus, Glaucoma, Hyperlipidemia, Hypertension, Normal cardiac stress test (12/2015), Panic attack, Prediabetes, Recurrent falls, and Social phobia.For the details of today's visit, please refer to assessment and plan.   HPI    Outpatient Encounter Medications as of 07/27/2023  Medication Sig   albuterol (PROVENTIL) (2.5 MG/3ML) 0.083% nebulizer solution Take 3 mLs (2.5 mg total) by nebulization every 4 (four) hours as needed for wheezing or shortness of breath.   albuterol (VENTOLIN HFA) 108 (90 Base) MCG/ACT inhaler Inhale 2 puffs into the lungs every 4 (four) hours as needed for wheezing or shortness of breath.   amLODipine (NORVASC) 10 MG tablet Take 1 tablet (10 mg total) by mouth daily.   Blood Glucose Monitoring Suppl DEVI 1 each by Does not apply route in the morning, at noon, and at bedtime. May substitute to any manufacturer covered by patient's insurance.   Cholecalciferol (VITAMIN D3) 50 MCG (2000 UT) capsule Take 1 capsule (2,000 Units total) by mouth daily.   clobetasol cream (TEMOVATE) 0.05 % Apply 1 Application topically 2 (two) times daily.   clopidogrel (PLAVIX) 75 MG tablet Take 1 tablet (75 mg total) by mouth daily.   gabapentin (NEURONTIN) 300 MG capsule Take 300 mg by mouth 3 (three) times daily.   glimepiride (AMARYL) 2 MG tablet Take 1 tablet (2 mg total) by mouth daily before breakfast.   lubiprostone (AMITIZA) 24 MCG capsule Take 1 capsule (24 mcg total) by mouth 2  (two) times daily with a meal.   mirtazapine (REMERON) 7.5 MG tablet Take 1 tablet (7.5 mg total) by mouth at bedtime.   pantoprazole (PROTONIX) 40 MG tablet Take 1 tablet (40 mg total) by mouth daily before breakfast.   rosuvastatin (CRESTOR) 40 MG tablet Take 1 tablet (40 mg total) by mouth at bedtime.   sitaGLIPtin (JANUVIA) 25 MG tablet Take 1 tablet (25 mg total) by mouth daily.   SURE COMFORT INSULIN SYRINGE 31G X 5/16" 0.5 ML MISC SMARTSIG:Injection As Directed   SURE COMFORT PEN NEEDLES 32G X 4 MM MISC Use with insulin daily   topiramate (TOPAMAX) 50 MG tablet Take 1 tablet (50 mg total) by mouth 2 (two) times daily.   [DISCONTINUED] HUMULIN R 100 UNIT/ML injection Inject 5 units before breakfast and 5 units before lunch and dinner.   [DISCONTINUED] LEVEMIR FLEXPEN 100 UNIT/ML FlexPen Inject 12 Units into the skin at bedtime.   [DISCONTINUED] lisinopril-hydrochlorothiazide (ZESTORETIC) 20-12.5 MG tablet TAKE ONE TABLET BY MOUTH ONCE DAILY.   [DISCONTINUED] Rimegepant Sulfate (NURTEC) 75 MG TBDP Take 1 tablet (75 mg total) by mouth once as needed for up to 1 dose.   lisinopril-hydrochlorothiazide (ZESTORETIC) 20-12.5 MG tablet Take 1 tablet by mouth daily.   No facility-administered encounter medications on file as of 07/27/2023.    Past Surgical History:  Procedure Laterality Date   ABDOMINAL HYSTERECTOMY     fibroids, both ovaries left intact   ANKLE SURGERY     CHOLECYSTECTOMY  COLONOSCOPY N/A 10/18/2019   Procedure: COLONOSCOPY;  Surgeon: Corbin Ade, MD;  [facility];  Service: Endoscopy;  Laterality: N/A;  1:00-office rescheduled to 11/10 @ 12:45pm   COSMETIC SURGERY     elbow   FRACTURE SURGERY     Recurrent elbow surgery   LIPOMA EXCISION  04/30/2012   Procedure: EXCISION LIPOMA;  Surgeon: Fabio Bering, MD;  Location: [facility];  Service: General;  Laterality: Right;  Excision soft tissue mass right thigh   MASS EXCISION Right 12/19/2020   Procedure:  EXCISION CYST; 2 CM; AXILLA;  Surgeon: Lucretia Roers, MD;  Location: [facility];  Service: General;  Laterality: Right;   ORIF ANKLE FRACTURE Right 12/15/2017   Procedure: OPEN REDUCTION INTERNAL FIXATION (ORIF) RIGHT ANKLE FRACTURE;  Surgeon: Sheral Apley, MD;  Location: [facility];  Service: Orthopedics;  Laterality: Right;   POLYPECTOMY  10/18/2019   Procedure: POLYPECTOMY;  Surgeon: Corbin Ade, MD;  [facility];  Service: Endoscopy;;  colon   right elbow     reconstruction    Review of Systems  Constitutional:  Negative for chills and fever.  Eyes:  Negative for photophobia.  Respiratory:  Negative for shortness of breath.   Cardiovascular:  Negative for leg swelling.  Gastrointestinal:  Negative for abdominal pain.  Genitourinary:  Negative for dysuria.  Neurological:  Negative for dizziness.      Objective    BP (!) 145/79   Pulse (!) 105   Resp 16   Ht 5\' 1"  (1.549 m)   Wt 159 lb (72.1 kg)   SpO2 98%   BMI 30.04 kg/m   Physical Exam Vitals reviewed.  Constitutional:      General: She is not in acute distress.    Appearance: Normal appearance. She is not ill-appearing, toxic-appearing or diaphoretic.  HENT:     Head: Normocephalic.  Eyes:     General:        Right eye: No discharge.        Left eye: No discharge.     Conjunctiva/sclera: Conjunctivae normal.  Cardiovascular:     Rate and Rhythm: Normal rate.     Pulses: Normal pulses.     Heart sounds: Normal heart sounds.  Pulmonary:     Effort: Pulmonary effort is normal. No respiratory distress.     Breath sounds: Normal breath sounds.  Musculoskeletal:        General: Normal range of motion.     Cervical back: Normal range of motion.  Skin:    General: Skin is warm and dry.     Capillary Refill: Capillary refill takes less than 2 seconds.  Neurological:     General: No focal deficit present.     Mental Status: She is alert and oriented to person, place, and time.      Coordination: Coordination normal.     Gait: Gait normal.  Psychiatric:        Mood and Affect: Mood normal.        Behavior: Behavior normal.       Assessment & Plan:  Primary hypertension -     CMP14+EGFR  Type 2 diabetes mellitus without complication, with long-term current use of insulin (HCC) Assessment & Plan: Last Hemoglobin A1c  7.9, Labs ordered today awaiting results will follow up. Patient reports stop taking insulin a few weeks ago and does not want to continue. She is interested  in oral diabetic regimen, has had metformin  before with adverse effects.  Discussed medication desired effects, potential side effects, and how to administer the medication. Discussed nonpharmacologic interventions such as low carb diet,high in protein, vegetables and fruit discussed. Educated on importance of physical activity 150 minutes per week. Discussed signs and symptoms of hypoglycemia, & hyperglycemia and need to present to the ED if symptoms occurs.Follow up in 3 months or sooner if needed. Patient verbalizes understanding regarding plan of care and all questions answered. Ophthalmology referral placed , Foot exam within desired limits  Started Glimepiride 2 mg and Januvia 25 mg - advise the importance medication compliance   Orders: -     Hemoglobin A1c  Mixed hyperlipidemia -     Lipid panel  Vitamin B12 deficiency -     Vitamin B12  Depression, recurrent (HCC) Assessment & Plan: Flowsheet Row Office Visit from 07/27/2023 in [facility]  PHQ-9 Total Score 10      Depression with Insomnia, loss of appetite Trial on Remeron 7.5 mg at bedtime Discussed about cognitive behavioral therapy focusing on thoughts, belief, and attitudes that affects feelings and behavior, learning about coping skills to deal with certain problems. Maintaining a consistent routine and schedule, Practice stress management and self calming techniques, excersise regularly and spend time  outdoors, Do not eat food that are high in fat, added sugar, or salt. Referral to behavior health    Patient verbally consented to [facility] services about presenting concerns and psychiatric consultation as appropriate. The services will be billed as appropriate for the patient.     Orders: -     Mirtazapine; Take 1 tablet (7.5 mg total) by mouth at bedtime.  Dispense: 30 tablet; Refill: 3 -     Ambulatory referral to Behavioral Health  Essential hypertension Assessment & Plan: Vitals:   07/27/23 1453 07/27/23 1500  BP: (!) 170/98 (!) 145/79  Patient reported did not take blood pressure medication today , because she was taking care of her grandchildren. Continue Lisinopril-hydrochlorothiazide 20-12.5 and amlodipine 10 mg Follow up in 1 week with at home blood pressure reading Advise the importance of medication compliance  Continued discussion on DASH diet, low sodium diet and maintain a exercise routine for 150 minutes per week.    Other orders -     SITagliptin Phosphate; Take 1 tablet (25 mg total) by mouth daily.  Dispense: 90 tablet; Refill: 1 -     Glimepiride; Take 1 tablet (2 mg total) by mouth daily before breakfast.  Dispense: 90 tablet; Refill: 1 -     Lisinopril-hydroCHLOROthiazide; Take 1 tablet by mouth daily.  Dispense: 90 tablet; Refill: 3    Return in about 3 months (around 10/27/2023), or if symptoms worsen or fail to improve, for Depression, type 2 diabetes, hypertension.   Cruzita Lederer Newman Nip, FNP

## 2023-07-29 ENCOUNTER — Ambulatory Visit: Payer: Medicaid Other | Admitting: Occupational Therapy

## 2023-07-29 ENCOUNTER — Encounter: Payer: Self-pay | Admitting: Occupational Therapy

## 2023-07-29 DIAGNOSIS — R278 Other lack of coordination: Secondary | ICD-10-CM

## 2023-07-29 DIAGNOSIS — M6281 Muscle weakness (generalized): Secondary | ICD-10-CM

## 2023-07-29 DIAGNOSIS — R208 Other disturbances of skin sensation: Secondary | ICD-10-CM

## 2023-07-29 NOTE — Patient Instructions (Signed)
  Safety considerations for loss of sensation:   Look at affected hand when using it!   Do NOT use affected arm for anything: sharp, hot, breakable, or too heavy  Always check temperature of water (for showering, washing dishes, etc) with UNaffected arm/extremity  Consider travel mugs w/ lids to transport hot liquids/coffee  Consider alternative options and/or adaptive equipment to make things safer (ex: hand chopper or cut resistant glove for chopping vegetables)   Avoid cold temperatures as well (wear glove in cold temperatures, get ice w/ unaffected extremity)  AVOID handling chemicals and machinery   

## 2023-07-29 NOTE — Therapy (Signed)
OUTPATIENT OCCUPATIONAL THERAPY NEURO TREATMENT  Patient Name: Kayla Choi MRN: [id number] DOB:1966/09/03, 57 y.o., female Today's Date: 07/29/2023  PCP: Lonna Cobb, Ellsworth Lennox, FNP REFERRING PROVIDER: Glean Salvo, NP  END OF SESSION:  OT End of Session - 07/29/23 1110     Visit Number 5    Number of Visits 9    Date for OT Re-Evaluation 08/08/23    Authorization Type [facility] MCD - 5 visits    Authorization Time Period 7/30 - 09/04/23    Authorization - Visit Number 4    Authorization - Number of Visits 5    OT Start Time 1109    OT Stop Time 1150    OT Time Calculation (min) 41 min    Activity Tolerance Patient tolerated treatment well    Behavior During Therapy WFL for tasks assessed/performed              Past Medical History:  Diagnosis Date   Acid reflux    ADD (attention deficit disorder)    Anemia    iron deficinecy   Anxiety    with social phobia   Asthma    Balance problem    Chest pain    that occurs with anxiety   COPD (chronic obstructive pulmonary disease) (HCC)    Depression    Diabetes mellitus    Glaucoma    Hyperlipidemia    Hypertension    Normal cardiac stress test 12/2015   low risk study   Panic attack    Prediabetes    Recurrent falls    Social phobia    Past Surgical History:  Procedure Laterality Date   ABDOMINAL HYSTERECTOMY     fibroids, both ovaries left intact   ANKLE SURGERY     CHOLECYSTECTOMY     COLONOSCOPY N/A 10/18/2019   Procedure: COLONOSCOPY;  Surgeon: Corbin Ade, MD;  Location: [facility];  Service: Endoscopy;  Laterality: N/A;  1:00-office rescheduled to 11/10 @ 12:45pm   COSMETIC SURGERY     elbow   FRACTURE SURGERY     Recurrent elbow surgery   LIPOMA EXCISION  04/30/2012   Procedure: EXCISION LIPOMA;  Surgeon: Fabio Bering, MD;  Location: [facility];  Service: General;  Laterality: Right;  Excision soft tissue mass right thigh   MASS EXCISION Right 12/19/2020   Procedure: EXCISION CYST; 2 CM;  AXILLA;  Surgeon: Lucretia Roers, MD;  Location: [facility];  Service: General;  Laterality: Right;   ORIF ANKLE FRACTURE Right 12/15/2017   Procedure: OPEN REDUCTION INTERNAL FIXATION (ORIF) RIGHT ANKLE FRACTURE;  Surgeon: Sheral Apley, MD;  Location: [facility];  Service: Orthopedics;  Laterality: Right;   POLYPECTOMY  10/18/2019   Procedure: POLYPECTOMY;  Surgeon: Corbin Ade, MD;  Location: [facility];  Service: Endoscopy;;  colon   right elbow     reconstruction   Patient Active Problem List   Diagnosis Date Noted   Depression, recurrent (HCC) 07/27/2023   H/O adenomatous polyp of colon 07/15/2023   Constipation 07/15/2023   Esophageal dysphagia 07/15/2023   Epigastric abdominal pain 07/15/2023   N&V (nausea and vomiting) 07/15/2023   Poor appetite 07/15/2023   Stroke (HCC) 06/04/2023   Paresthesia 06/04/2023   Snoring 06/04/2023   Cystitis 05/08/2023   Hypotension 05/06/2023   Lightheadedness 05/06/2023   Blurred vision 05/06/2023   Left hand pain 04/16/2023   Unintentional weight loss 04/16/2023   Left sided numbness 12/01/2022   Class 2 obesity 09/02/2017  Recurrent falls 05/13/2016   Sebaceous cyst of right axilla 03/10/2016   Onychomycosis of toenail 02/28/2015   Knee pain 10/06/2014   Neck muscle spasm 07/02/2014   Migraines 05/12/2014   History of keloid of skin 03/28/2014   Back pain 10/23/2013   Headache 10/05/2012   Inadequate social support 06/25/2012   Microalbuminuria 06/25/2012   Diabetes mellitus, type II (HCC) 04/02/2012   Keloid scar of skin 09/03/2011   COPD (chronic obstructive pulmonary disease) (HCC) 05/21/2007   Hyperlipidemia 01/29/2007   Anxiety state 11/02/2006   MDD (major depressive disorder) 11/02/2006   Essential hypertension 11/02/2006    ONSET DATE: 06/04/2023 (referral date)   REFERRING DIAG: I69.90 (ICD-10-CM) - Late, effect, cerebrovascular disease I63.81 (ICD-10-CM) - Lacunar stroke (HCC) Note:  To  work on left hand, left leg with walking, 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 She is still having left-sided poststroke paresthesias and mild weakness and mild cognitive difficulties 3  PRECAUTIONS: Other: NO lifting over 15 lbs (d/t old Rt elbow fx)   WEIGHT BEARING RESTRICTIONS: No  PAIN:  Are you having pain? No  FALLS: Has patient fallen in last 6 months?  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, and Leisure: cooking  PATIENT GOALS: get my hand better, take care of 52 year old granddaughter  OBJECTIVE:   HAND DOMINANCE: Right but did use Lt hand a lot prior to stroke since Rt elbow fx several years ago  ADLs: Overall ADLs: rest breaks, slower Transfers/ambulation related to ADLs: Eating: mod I w/ Rt hand, but unable to cut food Grooming: mod I w/ Rt hand UB Dressing: mod I  LB Dressing: mod I - slip on shoes Toileting: mod I  Bathing: pt does but reports difficult using Lt hand to get Rt arm (seated) Tub Shower transfers: mod I - has folding chair in tub/shower   IADLs: Shopping: daughter takes pt, pt uses  scooter and daughter assist  Light housekeeping: occasionally washes dishes, laundry, boyfriend does most cleaning Meal Prep: mostly microwaveable items, some light stovetop (drinks Ensure for breakfast)  Community mobility: relies on boyfriend or daughter  Medication management: has trouble remembering to take meds Financial management: sometimes to forget Handwriting:  denies change (uses Rt hand)   MOBILITY STATUS: Independent   FUNCTIONAL OUTCOME MEASURES: Upper Extremity Functional Scale (UEFS): 14/80 = 17.5% which translates to 82.5% deficit  UPPER EXTREMITY ROM:  BUE AROM WNL's  UPPER EXTREMITY MMT:   RUE MMT grossly 5/5, LUE MMT grossly 4/5  HAND FUNCTION: Grip strength: Right: 63.2 lbs; Left: 42.5 lbs  COORDINATION: 9 Hole Peg test: Right: 21.42 sec; Left: 38.64 sec  SENSATION: Light touch: Impaired - fingertips and hand Lt side (especially first 3 fingers)  EDEMA: none at eval, but pt reports swelling in Lt hand at times   COGNITION: Overall cognitive status:  pt suffers from anxiety and depression which can affect cognition - unsure if memory worse since stroke  VISION: Subjective report: pt reports fluctuates - Lt eye bothers her more in the morning Baseline vision:  has glasses but doesn't always wear Visual history:  borderline glaucoma  VISION ASSESSMENT: Not tested   PERCEPTION: Not tested  PRAXIS: Not tested  OBSERVATIONS at Eval: Pt has had previous outpatient O.T. for stroke (3 months) at [facility] from Feb - March 2024. Pt limited by decreased motivation and depression/anxiety   TODAY'S TREATMENT:                 Pt issued bed positioning handout for increased comfort, pain prevention Lt hand.  Pt issued sensory considerations for loss of sensation (see pt instructions) and reviewed.  Pt also issued A/E recommendations for increased ease, safety, and independence with ADLS including: tub bench, cut resistance glove, hand vegetable chopper,  jar opener, pan holder. Pt already has adapted fork/knife to cut cooked food, electric can opener, and loofah to bathe easier.   Pt copying peg design with small pegs using Lt hand with only min error in design. Pt braiding string for bilateral integration and coordination  PATIENT EDUCATION: Education details: Putty (progressed to green); cutting; foam tubing  Person educated: Patient Education method: Explanation, Demonstration, Tactile cues, and Verbal cues Education comprehension: verbalized understanding, returned demonstration, verbal cues required, tactile cues required, and needs further education  HOME EXERCISE PROGRAM: 07/16/23: Coordination Activities (written list) 07/21/23: Putty Exercises (reprinted from previous OT handout) 07/29/23: bed positioning handout, pain prevention, A/E recommendations, safety considerations d/t sensory loss Lt hand   GOALS: Goals reviewed with patient? Yes   LONG TERM GOALS: Target date: 08/08/23  Independent with Lt hand coordination and putty HEP  Baseline: Not yet addressed Goal status: MET  2.  Pt to improve coordination as evidenced by reducing speed on 9 hole peg test to 33 sec. Or less Baseline: 38 sec Goal status: IN Progress  3.  Improve Lt grip strength to 47 lbs or greater to assist with opening jars Baseline: 42.5 lbs Goal status: IN Progress (07/29/23 = 41 LBS)   4.  UEFI scale to show less than 75% deficits  Baseline: 82.5%  Goal status: INITIAL  5.  Pt to verbalize understanding with safety considerations and pain management strategies for sensory loss and pain Lt hand Baseline: not yet addressed Goal status: MET  6.  Pt to verbalize understanding with task modifications and A/E to increase safety and independence (tub bench, rocker knife, kitchen items)  Baseline: not yet addressed Goal status: MET on 07/29/23  ASSESSMENT:  CLINICAL IMPRESSION: Pt has met 3/6 LTG's at this time and making good progress with Lt hand  function. Sensation also improved Lt hand.  PERFORMANCE DEFICITS: in functional skills including ADLs, IADLs, coordination, dexterity, sensation, strength, pain, Fine motor control, mobility, body mechanics, endurance, decreased knowledge of use of DME, vision, and UE functional use, cognitive skills including memory and safety awareness, and psychosocial skills including coping strategies.   IMPAIRMENTS: are limiting patient from ADLs, IADLs, rest and sleep, leisure, and social participation.   CO-MORBIDITIES: has co-morbidities such as anxiety/depression  that affects occupational performance. Patient will benefit from skilled OT to address above impairments and improve overall function.  REHAB POTENTIAL: Fair time since stroke, depression/anxiety  PLAN:  OT FREQUENCY: 2x/week  OT DURATION: 6 weeks, however pt has already used 19 MCD visits earlier this year and will probably only be approved for 8 visits  PLANNED INTERVENTIONS: self care/ADL training, therapeutic exercise, therapeutic activity, neuromuscular re-education, manual therapy, functional mobility training, fluidotherapy, patient/family education, cognitive remediation/compensation, energy conservation, coping strategies training, and DME and/or AE instructions  RECOMMENDED OTHER SERVICES: none at this time  CONSULTED AND AGREED WITH PLAN OF CARE: Patient  PLAN FOR NEXT SESSION: Check remaining goals - anticipate d/c next session d/t progress towards goals and insurance visit limit   Sheran Lawless, OT 07/29/2023, 11:15 AM

## 2023-07-30 ENCOUNTER — Ambulatory Visit: Payer: Medicaid Other | Admitting: Neurology

## 2023-07-30 ENCOUNTER — Encounter: Payer: Self-pay | Admitting: Neurology

## 2023-07-30 VITALS — BP 172/90 | HR 72 | Ht 61.0 in | Wt 159.0 lb

## 2023-07-30 DIAGNOSIS — R208 Other disturbances of skin sensation: Secondary | ICD-10-CM | POA: Diagnosis not present

## 2023-07-30 DIAGNOSIS — R200 Anesthesia of skin: Secondary | ICD-10-CM

## 2023-07-30 DIAGNOSIS — I6381 Other cerebral infarction due to occlusion or stenosis of small artery: Secondary | ICD-10-CM | POA: Insufficient documentation

## 2023-07-30 DIAGNOSIS — R202 Paresthesia of skin: Secondary | ICD-10-CM

## 2023-07-30 NOTE — Patient Instructions (Signed)
57 y.o. year old female  here : She has past medical history of hypertension, hyperlipidemia, diabetes, COPD, acid reflux, iron deficiency anemia and asthma, suffered a non -lacunar stroke. 12-01-2022       1) history of Stroke, referred for ruling out OSA as a stroke risk factor. This patients stroke was first described as  a lacune , but Dr Pearlean Brownie described clearly an MRI finding that could be embolic or thrombotic - She had an MRI at the next day which showed a  large 1.9 x 1.4 x 2.9 cm right corona radiata infarct with MR angiogram showing multifocal right M1 stenosis and moderate left P2 stenosis.    2) borderline depression GDS at 5/ 15 points, but no sleepiness or high level of fatigue was endorsed.    3) follows stroke MD / NP for paresthesias and vascular cognitive changes.       Since this patient's stroke is no longer acute and by now 8 months past I will order a home sleep test.  If it is difficult for the patient to reach [facility] from her residence I would alternatively offer her an in-lab sleep test that may be easier to arrange for some.  The patient did not have a preference and therefore I ordered a home sleep test.   I plan to follow up either personally or through  NP within 4 months.    I would like to thank  Micki Riley, Md [address] [place],  [place] [address] for allowing me to meet with and to take care of this pleasant patient.

## 2023-07-30 NOTE — Progress Notes (Signed)
SLEEP MEDICINE CLINIC    Provider:  Melvyn Novas, MD  Primary Care Physician:  Rica Records, FNP [id number] [address]. [address] [place] [address]     Referring Provider: Micki Riley, Md [address] [place],  [place] [address]          Chief Complaint according to patient   Patient presents with:     New Sleep Patient (Initial Visit) referred by Stroke MD and NP.            HISTORY OF PRESENT ILLNESS:  Kayla Choi is a 57 y.o. female AA patient who is seen upon referral by Dr Pearlean Brownie NP Christia Reading on 07/30/2023  for an evaluation of possible sleep apnea. She suffered a stroke on Christmas day 2023 .  She re ose on Christmas morning and  wrapped gifts and was decorating a table, went to her brother s house by car for the family gathering. Her daughter saw her driving erratically, I" I didn't feel right '.  Daughter drove her to the ED in [place].   She suffered a CVA affecting the left side of the body.    Seen by Dr Pearlean Brownie 02-12-2023:  Kayla Choi is a 57 year old pleasant African-American lady seen today for initial office consultation visit for stroke.  History is obtained from the patient and review of electronic medical records and I have reviewed pertinent available imaging films in PACS. She has past medical history of hypertension, hyperlipidemia, diabetes, COPD, acid reflux, iron deficiency anemia and asthma.  Patient stated that she was driving on 25/36/6440 when she developed sudden onset of numbness involving left side of the face and arm.  She went to [facility] emergency room.  Initially code stroke was not activated due to her difficult symptoms.  She had an MRI at the next day which showed a  large 1.9 x 1.4 x 2.9 cm right corona radiata infarct with MR angiogram showing multifocal right M1 stenosis and moderate left P2 stenosis.  However CT angiogram does not show significant narrowing of any large intracranial vessels.  2D echo showed normal  ejection fraction of 60 to 65% without cardiac source of embolism.  Hemoglobin A1c was elevated at 11.2 and LDL cholesterol at 148.  Urine drug screen was negative.  Patient was not started on aspirin due to allergy history and strict given Plavix alone.  She states she has done well since discharge made gradual improvement.  She still has weakness in the left hand and hand muscles.  She still has some intermittent numbness in the left face and body.  She has also noticed some short-term memory and cognitive difficulties poststroke.  She is currently doing occupational and physical therapy and finds it helpful.  Having still persistent paresthesias and discomfort and pain in the left hand.  She has tried taking Tylenol which has not helped.  He has not undergone any outpatient cardiac monitoring for his A-fib.  He clinical diabetes is under better control and she has an upcoming visit.  Tolerating Crestor well without muscle aches and pains.  Her blood pressure is usually better at home though it is slightly elevated today at 156/89.  She has not had any other recurrent stroke or TIA symptoms.  She denies any prior history of DVT, pulm embolism or stroke in the in the family.  I have the pleasure of seeing Kayla Choi 07/30/23 a right-handed AA female with a possible sleep disorder. She  is here in the framework for stroke risk factor work up- she had a previous sleep study in 2012 with Josephina Shih, [place], [place] and that study did not show OSA, AHI was 3      Sleep relevant medical history: Snoring, Nocturia 2-4    Family medical /sleep history: no other family member on CPAP with OSA, insomnia, sleep walkers.    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 No pets,  2 children /adults and 4 grandchildren.  Tobacco use none.  ETOH use none,  Caffeine intake in form of Coffee( rare ) Soda( mountain dew 1-2 / week) Tea ( 2/ week)  or energy drinks     Sleep habits are as follows: The patient's dinner time is between 6-11 PM. The patient goes to bed at 11 PM- 2 AM-  she cannot sleep if the house isn't clean. She often falls asleep in the den,  then goes to shower,  and continues to sleep for intervals of 1 hours, wakes for unknown reasons, mostly when she can't change positions. The preferred sleep position is right sided, with the support of 1 pillows. Dreams are reportedly rare  The patient wakes up spontaneously/ - always before 7 AM . She reports not feeling refreshed or restored in AM, with symptoms such as dry mouth, achy, , morning headaches, and residual fatigue. Naps are taken infrequently   Review of Systems: Out of a complete 14 system review, the patient complains of only the following symptoms, and all other reviewed systems are negative.:  Fatigue, sleepiness , snoring, fragmented sleep, Insomnia, RLS, Nocturia    How likely are you to doze in the following situations: 0 = not likely, 1 = slight chance, 2 = moderate chance, 3 = high chance   Sitting and Reading? Watching Television? Sitting inactive in a public place (theater or meeting)? As a passenger in a car for an hour without a break? Lying down in the afternoon when circumstances permit? Sitting and talking to someone? Sitting quietly after lunch without alcohol? In a car, while stopped for a few minutes in traffic?   Total = 7/ 24 points   FSS endorsed at 29/ 63 points.   Social History   Socioeconomic History   Marital status: Single    Spouse name: Not on file   Number of children: 2   Years of education: Not on file   Highest education level: Some college, no degree  Occupational History   Not on file  used to work at [employer], Leisure centre manager. Disabled   Tobacco Use   Smoking status: Never   Smokeless tobacco: Never  Vaping Use   Vaping status: Never Used  Substance and Sexual Activity   Alcohol use: No    Alcohol/week: 0.0  standard drinks of alcohol   Drug use: No   Sexual activity: Yes    Birth control/protection: Surgical  Other Topics Concern   Not on file  Social History Narrative   Not on file   Social Determinants of Health   Financial Resource Strain: High Risk (07/27/2023)   Overall Financial Resource Strain (CARDIA)    Difficulty of Paying Living Expenses: Hard  Food Insecurity: Food Insecurity Present (07/27/2023)   Hunger Vital Sign    Worried About Running Out of Food in the Last Year: Sometimes true    Ran Out of Food in the Last Year:  Sometimes true  Transportation Needs: Unmet Transportation Needs (07/27/2023)   PRAPARE - Transportation    Lack of Transportation (Medical): Yes    Lack of Transportation (Non-Medical): Yes  Physical Activity: Insufficiently Active (07/27/2023)   Exercise Vital Sign    Days of Exercise per Week: 4 days    Minutes of Exercise per Session: 10 min  Stress: Stress Concern Present (07/27/2023)   [employer] of Occupational Health - Occupational Stress Questionnaire    Feeling of Stress : To some extent  Social Connections: Moderately Isolated (07/27/2023)   Social Connection and Isolation Panel [NHANES]    Frequency of Communication with Friends and Family: More than three times a week    Frequency of Social Gatherings with Friends and Family: Once a week    Attends Religious Services: Never    Database administrator or Organizations: No    Attends Engineer, structural: Not on file    Marital Status: Living with partner    Family History  Problem Relation Age of Onset   Hypertension Mother    Diabetes Father    Heart disease Father    Anesthesia problems Neg Hx    Malignant hyperthermia Neg Hx    Hypotension Neg Hx    Pseudochol deficiency Neg Hx     Past Medical History:  Diagnosis Date   Acid reflux    ADD (attention deficit disorder)    Anemia    iron deficinecy   Anxiety    with social phobia   Asthma    Balance problem     Chest pain    that occurs with anxiety   COPD (chronic obstructive pulmonary disease) (HCC)    Depression    Diabetes mellitus    Glaucoma    Hyperlipidemia    Hypertension    Normal cardiac stress test 12/2015   low risk study   Panic attack    Prediabetes    Recurrent falls    Social phobia     Past Surgical History:  Procedure Laterality Date   ABDOMINAL HYSTERECTOMY     fibroids, both ovaries left intact   ANKLE SURGERY     CHOLECYSTECTOMY     COLONOSCOPY N/A 10/18/2019   Procedure: COLONOSCOPY;  Surgeon: Corbin Ade, MD;  Location: [facility];  Service: Endoscopy;  Laterality: N/A;  1:00-office rescheduled to 11/10 @ 12:45pm   COSMETIC SURGERY     elbow   FRACTURE SURGERY     Recurrent elbow surgery   LIPOMA EXCISION  04/30/2012   Procedure: EXCISION LIPOMA;  Surgeon: Fabio Bering, MD;  Location: [facility];  Service: General;  Laterality: Right;  Excision soft tissue mass right thigh   MASS EXCISION Right 12/19/2020   Procedure: EXCISION CYST; 2 CM; AXILLA;  Surgeon: Lucretia Roers, MD;  Location: [facility];  Service: General;  Laterality: Right;   ORIF ANKLE FRACTURE Right 12/15/2017   Procedure: OPEN REDUCTION INTERNAL FIXATION (ORIF) RIGHT ANKLE FRACTURE;  Surgeon: Sheral Apley, MD;  Location: [facility];  Service: Orthopedics;  Laterality: Right;   POLYPECTOMY  10/18/2019   Procedure: POLYPECTOMY;  Surgeon: Corbin Ade, MD;  Location: [facility];  Service: Endoscopy;;  colon   right elbow     reconstruction     Current Outpatient Medications on File Prior to Visit  Medication Sig Dispense Refill   albuterol (PROVENTIL) (2.5 MG/3ML) 0.083% nebulizer solution Take 3 mLs (2.5 mg total) by nebulization every 4 (four)  hours as needed for wheezing or shortness of breath. 75 mL 2   albuterol (VENTOLIN HFA) 108 (90 Base) MCG/ACT inhaler Inhale 2 puffs into the lungs every 4 (four) hours as needed for wheezing or shortness of breath. 18 g 2    amLODipine (NORVASC) 10 MG tablet Take 1 tablet (10 mg total) by mouth daily. 90 tablet 1   Blood Glucose Monitoring Suppl DEVI 1 each by Does not apply route in the morning, at noon, and at bedtime. May substitute to any manufacturer covered by patient's insurance. 1 each 0   Cholecalciferol (VITAMIN D3) 50 MCG (2000 UT) capsule Take 1 capsule (2,000 Units total) by mouth daily. 90 capsule 1   clobetasol cream (TEMOVATE) 0.05 % Apply 1 Application topically 2 (two) times daily. 30 g 0   clopidogrel (PLAVIX) 75 MG tablet Take 1 tablet (75 mg total) by mouth daily. 30 tablet 3   gabapentin (NEURONTIN) 300 MG capsule Take 300 mg by mouth 3 (three) times daily.     glimepiride (AMARYL) 2 MG tablet Take 1 tablet (2 mg total) by mouth daily before breakfast. 90 tablet 1   lisinopril-hydrochlorothiazide (ZESTORETIC) 20-12.5 MG tablet Take 1 tablet by mouth daily. 90 tablet 3   lubiprostone (AMITIZA) 24 MCG capsule Take 1 capsule (24 mcg total) by mouth 2 (two) times daily with a meal. 60 capsule 11   mirtazapine (REMERON) 7.5 MG tablet Take 1 tablet (7.5 mg total) by mouth at bedtime. 30 tablet 3   pantoprazole (PROTONIX) 40 MG tablet Take 1 tablet (40 mg total) by mouth daily before breakfast. 30 tablet 15   rosuvastatin (CRESTOR) 40 MG tablet Take 1 tablet (40 mg total) by mouth at bedtime. 30 tablet 3   sitaGLIPtin (JANUVIA) 25 MG tablet Take 1 tablet (25 mg total) by mouth daily. 90 tablet 1   SURE COMFORT INSULIN SYRINGE 31G X 5/16" 0.5 ML MISC SMARTSIG:Injection As Directed 100 each 2   SURE COMFORT PEN NEEDLES 32G X 4 MM MISC Use with insulin daily 100 each 2   topiramate (TOPAMAX) 50 MG tablet Take 1 tablet (50 mg total) by mouth 2 (two) times daily. 60 tablet 6   No current facility-administered medications on file prior to visit.    Allergies  Allergen Reactions   Aspirin Shortness Of Breath and Itching   Metformin And Related Anaphylaxis   Other Anaphylaxis    Green peas   Oxycodone  Itching   Diphenhydramine Hcl Rash   Latex Itching and Rash   Neosporin [Neomycin-Bacitracin Zn-Polymyx] Swelling, Rash and Other (See Comments)    skin peeling     DIAGNOSTIC DATA (LABS, IMAGING, TESTING) - I reviewed patient records, labs, notes, testing and imaging myself where available.  Lab Results  Component Value Date   WBC 6.8 05/06/2023   HGB 12.3 05/06/2023   HCT 38.3 05/06/2023   MCV 95.3 05/06/2023   PLT 370 05/06/2023      Component Value Date/Time   NA 139 05/06/2023 1745   NA 141 04/10/2023 0945   K 3.6 05/06/2023 1745   CL 99 05/06/2023 1745   CO2 29 05/06/2023 1745   GLUCOSE 210 (H) 05/06/2023 1745   BUN 26 (H) 05/06/2023 1745   BUN 15 04/10/2023 0945   CREATININE 1.31 (H) 05/06/2023 1745   CREATININE 0.89 12/26/2020 1602   CALCIUM 9.2 05/06/2023 1745   PROT 7.0 04/10/2023 0945   ALBUMIN 4.2 04/10/2023 0945   AST 18 04/10/2023 0945   ALT 20  04/10/2023 0945   ALKPHOS 44 04/10/2023 0945   BILITOT 0.9 04/10/2023 0945   GFRNONAA 48 (L) 05/06/2023 1745   GFRAA >60 02/18/2019 0817   Lab Results  Component Value Date   CHOL 245 (H) 04/10/2023   HDL 54 04/10/2023   LDLCALC 168 (H) 04/10/2023   LDLDIRECT 136 (H) 06/25/2012   TRIG 127 04/10/2023   CHOLHDL 4.5 (H) 04/10/2023   Lab Results  Component Value Date   HGBA1C 7.9 (H) 04/10/2023   No results found for: "VITAMINB12" Lab Results  Component Value Date   TSH 1.030 04/10/2023    PHYSICAL EXAM:  Today's Vitals   07/30/23 0930  BP: (!) 172/90  Pulse: 72  Weight: 159 lb (72.1 kg)  Height: 5\' 1"  (1.549 m)   Body mass index is 30.04 kg/m.   Wt Readings from Last 3 Encounters:  07/30/23 159 lb (72.1 kg)  07/27/23 159 lb (72.1 kg)  07/15/23 162 lb (73.5 kg)     Ht Readings from Last 3 Encounters:  07/30/23 5\' 1"  (1.549 m)  07/27/23 5\' 1"  (1.549 m)  07/15/23 5\' 1"  (1.549 m)      General: The patient is awake, alert and appears not in acute distress. Head: Normocephalic, atraumatic.   Neck is supple. Mallampati 3,  neck circumference:14 inches . Nasal airflow  patent.   Retrognathia is  seen.  Dental status: irregular  Cardiovascular:  Regular rate and cardiac rhythm by pulse,  without distended neck veins. Respiratory: Lungs are clear to auscultation.  Skin:  With evidence of left ankle edema, left hand swelling  Trunk: The patient's posture is erect.   NEUROLOGIC EXAM: TMental Status: Awake and fully alert. Oriented to place and time. Recent and remote memory intact. Attention span, concentration and fund of knowledge appropriate. Cranial Nerves: Fundoscopic exam reveals sharp disc margins. Pupils equal, briskly reactive to light. Extraocular movements full without nystagmus. Visual fields full to confrontation. Hearing intact. Facial sensation intact.  Mild left nasolabial fold asymmetry, tongue, palate moves normally and symmetrically.  Motor: Normal bulk and tone. Normal strength in all tested extremity muscles except left grip weakness.  Diminished fine finger movements on the left.  Orbits right over left upper extremity.  Trace weakness of left hip flexors and ankle dorsiflexors on the left.. Sensory.:  Diminished left face and body pinprick sensation Coordination: Rapid alternating movements normal in all extremities. Finger-to-nose and heel-to-shin performed accurately bilaterally. Gait and Station: Arises from chair without difficulty. Stance is normal. Gait demonstrates normal stride length and balance with slight dragging of the left leg..  Not able to heel, toe and tandem walk without difficulty.  Reflexes: 1+/ 2  - asymmetric and brisker on the left.  Toes downgoing.      ASSESSMENT: 57 year old African-American lady with 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  sheis still having significant left-sided poststroke paresthesias  affecting left mid -face and left upper extremity , the  middle - and ring finger ,and mild weakness and mild cognitive difficulties  ASSESSMENT AND PLAN 57 y.o. year old female  here : She has past medical history of hypertension, hyperlipidemia, diabetes, COPD, acid reflux, iron deficiency anemia and asthma, suffered a non -lacunar stroke. 12-01-2022      1) history of Stroke, referred for ruling out OSA as a stroke risk factor. This patients stroke was a lacune , but Dr Pearlean Brownie described clearly an MRI  finding that could be embolic or thrombotic - She had an MRI at the next day which showed a  large 1.9 x 1.4 x 2.9 cm right corona radiata infarct with MR angiogram showing multifocal right M1 stenosis and moderate left P2 stenosis.   2) borderline depression GDS at 5/ 15 points, but no sleepiness or high level of fatigue was endorsed.   3) follows stroke MD / NP for paresthesias and vascular cognitive changes.    Since this patient's stroke is no longer acute and by now 8 months past I will order a home sleep test.  If it is difficult for the patient to reach [facility] from her residence I would alternatively offer her an in-lab sleep test that may be easier to arrange for some.  The patient did not have a preference and therefore I ordered a home sleep test.  I plan to follow up either personally or through  NP within 4 months.   I would like to thank  Micki Riley, Md [address] [place],  [place] [address] for allowing me to meet with and to take care of this pleasant patient.    After spending a total time of  45  minutes face to face and additional time for physical and neurologic examination, review of laboratory studies,  personal review of imaging studies, reports and results of other testing and review of referral information / records as far as provided in visit,   Electronically signed by: Melvyn Novas, MD 07/30/2023 9:49 AM  [facility] and  [facility] Sleep Board certified by The [employer] of Sleep Medicine and Diplomate of the [employer] of Sleep Medicine. Board certified In Neurology through the ABPN, Fellow of the [employer] of Neurology.

## 2023-08-01 LAB — CMP14+EGFR
ALT: 23 IU/L (ref 0–32)
AST: 19 IU/L (ref 0–40)
Albumin: 4.3 g/dL (ref 3.8–4.9)
Alkaline Phosphatase: 40 IU/L — ABNORMAL LOW (ref 44–121)
BUN/Creatinine Ratio: 10 (ref 9–23)
BUN: 11 mg/dL (ref 6–24)
Bilirubin Total: 0.7 mg/dL (ref 0.0–1.2)
CO2: 24 mmol/L (ref 20–29)
Calcium: 9.8 mg/dL (ref 8.7–10.2)
Chloride: 106 mmol/L (ref 96–106)
Creatinine, Ser: 1.05 mg/dL — ABNORMAL HIGH (ref 0.57–1.00)
Globulin, Total: 2.7 g/dL (ref 1.5–4.5)
Glucose: 130 mg/dL — ABNORMAL HIGH (ref 70–99)
Potassium: 4.1 mmol/L (ref 3.5–5.2)
Sodium: 141 mmol/L (ref 134–144)
Total Protein: 7 g/dL (ref 6.0–8.5)
eGFR: 62 mL/min/{1.73_m2} (ref >59)

## 2023-08-01 LAB — HEMOGLOBIN A1C
Est. average glucose Bld gHb Est-mCnc: 131 mg/dL
Hgb A1c MFr Bld: 6.2 % — ABNORMAL HIGH (ref 4.8–5.6)

## 2023-08-01 LAB — LIPID PANEL
Chol/HDL Ratio: 3.3 ratio (ref 0.0–4.4)
Cholesterol, Total: 187 mg/dL (ref 100–199)
HDL: 57 mg/dL (ref >39)
LDL Chol Calc (NIH): 115 mg/dL — ABNORMAL HIGH (ref 0–99)
Triglycerides: 82 mg/dL (ref 0–149)
VLDL Cholesterol Cal: 15 mg/dL (ref 5–40)

## 2023-08-01 LAB — VITAMIN B12: Vitamin B-12: 506 pg/mL (ref 232–1245)

## 2023-08-02 ENCOUNTER — Other Ambulatory Visit: Payer: Self-pay | Admitting: Family Medicine

## 2023-08-02 MED ORDER — ROSUVASTATIN CALCIUM 40 MG PO TABS: 40.0000 mg | ORAL_TABLET | Freq: Every day | ORAL | 3 refills | Status: DC

## 2023-08-03 ENCOUNTER — Telehealth: Payer: Self-pay | Admitting: Neurology

## 2023-08-03 NOTE — Telephone Encounter (Signed)
HST- [facility] pending.

## 2023-08-04 ENCOUNTER — Ambulatory Visit: Payer: Medicaid Other | Admitting: Occupational Therapy

## 2023-08-05 NOTE — Telephone Encounter (Signed)
HST- [facility] no auth req.

## 2023-08-06 ENCOUNTER — Ambulatory Visit: Payer: Medicaid Other | Admitting: Occupational Therapy

## 2023-08-06 ENCOUNTER — Encounter: Payer: Self-pay | Admitting: Occupational Therapy

## 2023-08-06 DIAGNOSIS — R278 Other lack of coordination: Secondary | ICD-10-CM

## 2023-08-06 DIAGNOSIS — R208 Other disturbances of skin sensation: Secondary | ICD-10-CM

## 2023-08-06 DIAGNOSIS — M6281 Muscle weakness (generalized): Secondary | ICD-10-CM

## 2023-08-06 NOTE — Therapy (Signed)
OUTPATIENT OCCUPATIONAL THERAPY NEURO TREATMENT/DISCHARGE SUMMARY  Patient Name: Kayla Choi MRN: [id number] DOB:06-17-1966, 57 y.o., female Today's Date: 08/06/2023  PCP: Lonna Cobb, Ellsworth Lennox, FNP REFERRING PROVIDER: Glean Salvo, NP  END OF SESSION:  OT End of Session - 08/06/23 0931     Visit Number 6    Number of Visits 9    Date for OT Re-Evaluation 08/08/23    Authorization Type HB MCD - 5 visits    Authorization Time Period 7/30 - 09/04/23    Authorization - Visit Number 5    Authorization - Number of Visits 5    OT Start Time 0930    OT Stop Time 1000    OT Time Calculation (min) 30 min    Activity Tolerance Patient tolerated treatment well    Behavior During Therapy WFL for tasks assessed/performed              Past Medical History:  Diagnosis Date   Acid reflux    ADD (attention deficit disorder)    Anemia    iron deficinecy   Anxiety    with social phobia   Asthma    Balance problem    Chest pain    that occurs with anxiety   COPD (chronic obstructive pulmonary disease) (HCC)    Depression    Diabetes mellitus    Glaucoma    Hyperlipidemia    Hypertension    Normal cardiac stress test 12/2015   low risk study   Panic attack    Prediabetes    Recurrent falls    Social phobia    Past Surgical History:  Procedure Laterality Date   ABDOMINAL HYSTERECTOMY     fibroids, both ovaries left intact   ANKLE SURGERY     CHOLECYSTECTOMY     COLONOSCOPY N/A 10/18/2019   Procedure: COLONOSCOPY;  Surgeon: Corbin Ade, MD;  [facility]: [facility];  Service: Endoscopy;  Laterality: N/A;  1:00-office rescheduled to 11/10 @ 12:45pm   COSMETIC SURGERY     elbow   FRACTURE SURGERY     Recurrent elbow surgery   LIPOMA EXCISION  04/30/2012   Procedure: EXCISION LIPOMA;  Surgeon: Fabio Bering, MD;  Location: [facility];  Service: General;  Laterality: Right;  Excision soft tissue mass right thigh   MASS EXCISION Right 12/19/2020   Procedure: EXCISION  CYST; 2 CM; AXILLA;  Surgeon: Lucretia Roers, MD;  Location: [facility];  Service: General;  Laterality: Right;   ORIF ANKLE FRACTURE Right 12/15/2017   Procedure: OPEN REDUCTION INTERNAL FIXATION (ORIF) RIGHT ANKLE FRACTURE;  Surgeon: Sheral Apley, MD;  Location: [facility];  Service: Orthopedics;  Laterality: Right;   POLYPECTOMY  10/18/2019   Procedure: POLYPECTOMY;  Surgeon: Corbin Ade, MD;  Location: [facility];  Service: Endoscopy;;  colon   right elbow     reconstruction   Patient Active Problem List   Diagnosis Date Noted   Basal ganglia infarction (HCC) 07/30/2023   Dysesthesia of face 07/30/2023   Numbness and tingling in left hand 07/30/2023   Depression, recurrent (HCC) 07/27/2023   H/O adenomatous polyp of colon 07/15/2023   Constipation 07/15/2023   Esophageal dysphagia 07/15/2023   Epigastric abdominal pain 07/15/2023   N&V (nausea and vomiting) 07/15/2023   Poor appetite 07/15/2023   Stroke (HCC) 06/04/2023   Paresthesia 06/04/2023   Snoring 06/04/2023   Cystitis 05/08/2023   Hypotension 05/06/2023   Lightheadedness 05/06/2023   Blurred vision 05/06/2023  Left hand pain 04/16/2023   Unintentional weight loss 04/16/2023   Left sided numbness 12/01/2022   Class 2 obesity 09/02/2017   Recurrent falls 05/13/2016   Sebaceous cyst of right axilla 03/10/2016   Onychomycosis of toenail 02/28/2015   Knee pain 10/06/2014   Neck muscle spasm 07/02/2014   Migraines 05/12/2014   History of keloid of skin 03/28/2014   Back pain 10/23/2013   Headache 10/05/2012   Inadequate social support 06/25/2012   Microalbuminuria 06/25/2012   Diabetes mellitus, type II (HCC) 04/02/2012   Keloid scar of skin 09/03/2011   COPD (chronic obstructive pulmonary disease) (HCC) 05/21/2007   Hyperlipidemia 01/29/2007   Anxiety state 11/02/2006   MDD (major depressive disorder) 11/02/2006   Essential hypertension 11/02/2006    ONSET DATE: 06/04/2023 (referral  date)   REFERRING DIAG: I69.90 (ICD-10-CM) - Late, effect, cerebrovascular disease I63.81 (ICD-10-CM) - Lacunar stroke (HCC) Note:  To work on left hand, left leg with walking, 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 She is still having left-sided poststroke paresthesias and mild weakness and mild cognitive difficulties 3  PRECAUTIONS: Other: NO lifting over 15 lbs (d/t old Rt elbow fx)   WEIGHT BEARING RESTRICTIONS: No  PAIN:  Are you having pain? No  FALLS: Has patient fallen in last 6 months?  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, and Leisure: cooking  PATIENT GOALS: get my hand better, take care of 37 year old granddaughter  OBJECTIVE:   HAND DOMINANCE: Right but did use Lt hand a lot prior to stroke since Rt elbow fx several years ago  ADLs: Overall ADLs: rest breaks, slower Transfers/ambulation related to ADLs: Eating: mod I w/ Rt hand, but unable to cut food Grooming: mod I w/ Rt hand UB Dressing: mod I  LB Dressing: mod I - slip on shoes Toileting: mod I  Bathing: pt does but reports difficult using Lt hand to get Rt arm (seated) Tub Shower transfers: mod I - has  folding chair in tub/shower   IADLs: Shopping: daughter takes pt, pt uses scooter and daughter assist  Light housekeeping: occasionally washes dishes, laundry, boyfriend does most cleaning Meal Prep: mostly microwaveable items, some light stovetop (drinks Ensure for breakfast)  Community mobility: relies on boyfriend or daughter  Medication management: has trouble remembering to take meds Financial management: sometimes to forget Handwriting:  denies change (uses Rt hand)   MOBILITY STATUS: Independent   FUNCTIONAL OUTCOME MEASURES: Upper Extremity Functional Scale (UEFS): 14/80 = 17.5% which translates to 82.5% deficit  UPPER EXTREMITY ROM:  BUE AROM WNL's  UPPER EXTREMITY MMT:   RUE MMT grossly 5/5, LUE MMT grossly 4/5  HAND FUNCTION: Grip strength: Right: 63.2 lbs; Left: 42.5 lbs  COORDINATION: 9 Hole Peg test: Right: 21.42 sec; Left: 38.64 sec  SENSATION:  Light touch: Impaired - fingertips and hand Lt side (especially first 3 fingers)   EDEMA: none at eval, but pt reports swelling in Lt hand at times   COGNITION: Overall cognitive status:  pt suffers from anxiety and depression which can affect cognition - unsure if memory worse since stroke  VISION: Subjective report: pt reports fluctuates - Lt eye bothers her more in the morning Baseline vision:  has glasses but doesn't always wear Visual history:  borderline glaucoma  VISION ASSESSMENT: Not tested   PERCEPTION: Not tested  PRAXIS: Not tested  OBSERVATIONS at Eval: Pt has had previous outpatient O.T. for stroke (3 months) at [facility] from Feb - March 2024. Pt limited by decreased motivation and depression/anxiety   TODAY'S TREATMENT:                 Assessed remaining goals - see goal section below. UEFI scale = 56% (44% deficit) Verbally reviewed coordination and putty HEP's and discussed education re: sensory loss  Gripper set at level 2 resistance to pick up blocks Lt hand for sustained grip  strength with 2 rest breaks, min drops and mod difficulty (more drops and difficulty near end of task d/t fatigue)  PATIENT EDUCATION: See above  HOME EXERCISE PROGRAM: 07/16/23: Coordination Activities (written list) 07/21/23: Putty Exercises (reprinted from previous OT handout) 07/29/23: bed positioning handout, pain prevention, A/E recommendations, safety considerations d/t sensory loss Lt hand   GOALS: Goals reviewed with patient? Yes   LONG TERM GOALS: Target date: 08/08/23  Independent with Lt hand coordination and putty HEP  Baseline: Not yet addressed Goal status: MET  2.  Pt to improve coordination as evidenced by reducing speed on 9 hole peg test to 33 sec. Or less Baseline: 38 sec Goal status: MET (08/06/23 = 32 sec)   3.  Improve Lt grip strength to 47 lbs or greater to assist with opening jars Baseline: 42.5 lbs Goal status: NOT MET (07/29/23 = 41 LBS, 08/06/23 = 40 LBS)    4.  UEFI scale to show less than 75% deficits  Baseline: 82.5%  Goal status: MET (44% Deficit)   5.  Pt to verbalize understanding with safety considerations and pain management strategies for sensory loss and pain Lt hand Baseline: not yet addressed Goal status: MET  6.  Pt to verbalize understanding with task modifications and A/E to increase safety and independence (tub bench, rocker knife, kitchen items)  Baseline: not yet addressed Goal status: MET on 07/29/23  ASSESSMENT:  CLINICAL IMPRESSION: Pt has met 5/6 LTG's at this time and making good progress with Lt hand function. Sensation also improved Lt hand. Agrees to d/c  PERFORMANCE DEFICITS: in functional skills including ADLs, IADLs, coordination, dexterity, sensation, strength, pain, Fine motor control, mobility, body mechanics, endurance, decreased knowledge of use of DME, vision, and UE functional use, cognitive skills including memory and safety awareness, and psychosocial skills including coping strategies.   IMPAIRMENTS: are  limiting patient from ADLs, IADLs, rest and sleep, leisure, and social participation.   CO-MORBIDITIES: has co-morbidities such as anxiety/depression  that affects occupational performance. Patient will benefit from skilled OT to address above impairments and improve overall function.  REHAB POTENTIAL: Fair time since stroke, depression/anxiety  PLAN:  OT FREQUENCY: 2x/week  OT DURATION: 6 weeks, however pt has already used 19 MCD visits earlier this year and will probably only be approved for 8 visits  PLANNED INTERVENTIONS: self care/ADL training, therapeutic exercise, therapeutic activity, neuromuscular re-education, manual therapy, functional mobility  training, fluidotherapy, patient/family education, cognitive remediation/compensation, energy conservation, coping strategies training, and DME and/or AE instructions  RECOMMENDED OTHER SERVICES: none at this time  CONSULTED AND AGREED WITH PLAN OF CARE: Patient  PLAN  D/C O.T.   OCCUPATIONAL THERAPY DISCHARGE SUMMARY  Visits from Start of Care: 6  Current functional level related to goals / functional outcomes: SEE ABOVE   Remaining deficits: Grip strength Lt hand Mild coordination Lt hand Decreased sensation Lt hand   Education / Equipment: See above   Patient agrees to discharge. Patient goals were met. Patient is being discharged due to being pleased with the current functional level. And insurance visit limit     Sheran Lawless, OT 08/06/2023, 10:02 AM

## 2023-08-11 ENCOUNTER — Ambulatory Visit: Payer: Medicaid Other | Admitting: Occupational Therapy

## 2023-08-13 ENCOUNTER — Encounter: Payer: Medicaid Other | Admitting: Occupational Therapy

## 2023-08-13 NOTE — Telephone Encounter (Signed)
HST- MCD [employer] no auth req via fax form   Patient is scheduled at [facility] for 08/25/23 at 3:30 pm  Mailed packet to the patient.

## 2023-08-24 ENCOUNTER — Encounter: Payer: Self-pay | Admitting: *Deleted

## 2023-08-27 ENCOUNTER — Encounter: Payer: Self-pay | Admitting: *Deleted

## 2023-08-27 MED ORDER — PEG 3350-KCL-NA BICARB-NACL 420 G PO SOLR: 4000.0000 mL | Freq: Once | ORAL | 0 refills | Status: AC

## 2023-08-27 NOTE — Addendum Note (Signed)
Addended by: Armstead Peaks on: 08/27/2023 10:04 AM   Modules accepted: Orders

## 2023-08-27 NOTE — Telephone Encounter (Signed)
Pt called back. Scheduled for 11/6. Aware will send instructions/pre-op via mychart. Rx for prep sent to pharmacy.

## 2023-08-27 NOTE — Telephone Encounter (Addendum)
LMOVM to call back. [facility] MESSAGE SENT

## 2023-09-14 ENCOUNTER — Ambulatory Visit: Payer: Medicaid Other | Admitting: Neurology

## 2023-09-14 DIAGNOSIS — G4733 Obstructive sleep apnea (adult) (pediatric): Secondary | ICD-10-CM

## 2023-09-14 DIAGNOSIS — R208 Other disturbances of skin sensation: Secondary | ICD-10-CM

## 2023-09-14 DIAGNOSIS — I6381 Other cerebral infarction due to occlusion or stenosis of small artery: Secondary | ICD-10-CM

## 2023-09-14 DIAGNOSIS — R200 Anesthesia of skin: Secondary | ICD-10-CM
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
## 2023-09-21 DIAGNOSIS — G4733 Obstructive sleep apnea (adult) (pediatric): Secondary | ICD-10-CM | POA: Insufficient documentation
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
## 2023-09-22 NOTE — Telephone Encounter (Signed)
I called pt. I advised pt that Dr. Vickey Huger reviewed their sleep study results and found that pt has moderate to severe degree of sleep apnea. Dr. Vickey Huger recommends that pt starts auto CPAP. I reviewed PAP compliance expectations with the pt. Pt is agreeable to starting a CPAP. I advised pt that an order will be sent to a DME, [facility], and [facility] will call the pt within about one week after they file with the pt's insurance. [facility] will show the pt how to use the machine, fit for masks, and troubleshoot the CPAP if needed. A follow up appt was made for insurance purposes with Dr. Vickey Huger on 12/17/2023 at 10:30 am. Pt verbalized understanding to arrive 15 minutes early and bring their CPAP. Pt verbalized understanding of results. Pt had no questions at this time but was encouraged to call back if questions arise. I have sent the order to [facility] and have received confirmation that they have received the order.

## 2023-09-22 NOTE — Telephone Encounter (Signed)
-----   Message from Mansura Dohmeier sent at 09/21/2023  5:00 PM EDT ----- Stroke referral- remote stroke in Dec 2023,  tested positive for moderate OSA and will be best served by an auto CPAP . CPAP order already placed.  If the patient cannot use or tolerate CPAP, may use a dental device.

## 2023-09-27 ENCOUNTER — Encounter: Payer: Self-pay | Admitting: Professional Counselor

## 2023-09-30 NOTE — Telephone Encounter (Signed)
Appt changed

## 2023-10-09 ENCOUNTER — Encounter: Payer: Medicaid Other | Admitting: Professional Counselor

## 2023-10-12 ENCOUNTER — Encounter (HOSPITAL_COMMUNITY): Payer: Self-pay

## 2023-10-12 ENCOUNTER — Encounter (HOSPITAL_COMMUNITY)
Admission: RE | Admit: 2023-10-12 | Discharge: 2023-10-12 | Disposition: A | Discharge status: Home / Self Care | Payer: Medicaid Other | Source: Ambulatory Visit | Attending: Internal Medicine | Admitting: Internal Medicine

## 2023-10-12 HISTORY — DX: Sleep apnea, unspecified: G47.30

## 2023-10-12 NOTE — Patient Instructions (Signed)
Kayla Choi  10/12/2023     @PREFPERIOPPHARMACY @   Your procedure is scheduled on 10/14/2023.  Report to [facility] at 6:00 A.M.  Call this number if you have problems the morning of surgery:  [phone]  If you experience any cold or flu symptoms such as cough, fever, chills, shortness of breath, etc. between now and your scheduled surgery, please notify us at the above number.   Remember:   Please Follow the diet and prep instructions given to you by Dr Luvenia Starch office.     Take these medicines the morning of surgery with A SIP OF WATER : Amlodipine Gabapentin Pantoprazole and Topamax    Do not wear jewelry, make-up or nail polish, including gel polish,  artificial nails, or any other type of covering on natural nails (fingers and  toes).  Do not wear lotions, powders, or perfumes, or deodorant.  Do not shave 48 hours prior to surgery.  Men may shave face and neck.  Do not bring valuables to the hospital.  [facility] is not responsible for any belongings or valuables.  Contacts, dentures or bridgework may not be worn into surgery.  Leave your suitcase in the car.  After surgery it may be brought to your room.  For patients admitted to the hospital, discharge time will be determined by your treatment team.  Patients discharged the day of surgery will not be allowed to drive home.   Name and phone number of your driver:   family Special instructions:  n/a  Please read over the following fact sheets that you were given. Care and Recovery After Surgery  Upper Endoscopy, Adult Upper endoscopy is a procedure to look inside the upper GI (gastrointestinal) tract. The upper GI tract is made up of: The esophagus. This is the part of the body that moves food from your mouth to your stomach. The stomach. The duodenum. This is the first part of your small intestine. This procedure is also called esophagogastroduodenoscopy (EGD) or gastroscopy. In this procedure, your health  care provider passes a thin, flexible tube (endoscope) through your mouth and down your esophagus into your stomach and into your duodenum. A small camera is attached to the end of the tube. Images from the camera appear on a monitor in the exam room. During this procedure, your health care provider may also remove a small piece of tissue to be sent to a lab and examined under a microscope (biopsy). Your health care provider may do an upper endoscopy to diagnose cancers of the upper GI tract. You may also have this procedure to find the cause of other conditions, such as: Stomach pain. Heartburn. Pain or problems when swallowing. Nausea and vomiting. Stomach bleeding. Stomach ulcers. Tell a health care provider about: Any allergies you have. All medicines you are taking, including vitamins, herbs, eye drops, creams, and over-the-counter medicines. Any problems you or family members have had with anesthetic medicines. Any bleeding problems you have. Any surgeries you have had. Any medical conditions you have. Whether you are pregnant or may be pregnant. What are the risks? Your healthcare provider will talk with you about risks. These may include: Infection. Bleeding. Allergic reactions to medicines. A tear or hole (perforation) in the esophagus, stomach, or duodenum. What happens before the procedure? When to stop eating and drinking Follow instructions from your health care provider about what you may eat and drink. These may include: 8 hours before your procedure Stop eating most foods. Do not eat  meat, fried foods, or fatty foods. Eat only light foods, such as toast or crackers. All liquids are okay except energy drinks and alcohol. 6 hours before your procedure Stop eating. Drink only clear liquids, such as water, clear fruit juice, black coffee, plain tea, and sports drinks. Do not drink energy drinks or alcohol. 2 hours before your procedure Stop drinking all liquids. You may  be allowed to take medicines with small sips of water. If you do not follow your health care provider's instructions, your procedure may be delayed or canceled. Medicines Ask your health care provider about: Changing or stopping your regular medicines. This is especially important if you are taking diabetes medicines or blood thinners. Taking medicines such as aspirin and ibuprofen. These medicines can thin your blood. Do not take these medicines unless your health care provider tells you to take them. Taking over-the-counter medicines, vitamins, herbs, and supplements. General instructions If you will be going home right after the procedure, plan to have a responsible adult: Take you home from the hospital or clinic. You will not be allowed to drive. Care for you for the time you are told. What happens during the procedure?  An IV will be inserted into one of your veins. You may be given one or more of the following: A medicine to help you relax (sedative). A medicine to numb the throat (local anesthetic). You will lie on your left side on an exam table. Your health care provider will pass the endoscope through your mouth and down your esophagus. Your health care provider will use the scope to check the inside of your esophagus, stomach, and duodenum. Biopsies may be taken. The endoscope will be removed. The procedure may vary among health care providers and hospitals. What happens after the procedure? Your blood pressure, heart rate, breathing rate, and blood oxygen level will be monitored until you leave the hospital or clinic. When your throat is no longer numb, you may be given some fluids to drink. If you were given a sedative during the procedure, it can affect you for several hours. Do not drive or operate machinery until your health care provider says that it is safe. It is up to you to get the results of your procedure. Ask your health care provider, or the department that is  doing the procedure, when your results will be ready. Contact a health care provider if you: Have a sore throat that lasts longer than 1 day. Have a fever. Get help right away if you: Vomit blood or your vomit looks like coffee grounds. Have bloody, black, or tarry stools. Have a very bad sore throat or you cannot swallow. Have difficulty breathing or very bad pain in your chest or abdomen. These symptoms may be an emergency. Get help right away. Call 911. Do not wait to see if the symptoms will go away. Do not drive yourself to the hospital. Summary Upper endoscopy is a procedure to look inside the upper GI tract. During the procedure, an IV will be inserted into one of your veins. You may be given a medicine to help you relax. The endoscope will be passed through your mouth and down your esophagus. Follow instructions from your health care provider about what you can eat and drink. This information is not intended to replace advice given to you by your health care provider. Make sure you discuss any questions you have with your health care provider. Document Revised: 03/05/2022 Document Reviewed: 03/05/2022 Elsevier Patient Education  2024 [employer]  Monitored Anesthesia Care Anesthesia refers to the techniques, procedures, and medicines that help a person stay safe and comfortable during surgery. Monitored anesthesia care, or sedation, is one type of anesthesia. You may have sedation if you do not need to be asleep for your procedure. Procedures that use sedation may include: Surgery to remove cataracts from your eyes. A dental procedure. A biopsy. This is when a tissue sample is removed and looked at under a microscope. You will be watched closely during your procedure. Your level of sedation or type of anesthesia may be changed to fit your needs. Tell a health care provider about: Any allergies you have. All medicines you are taking, including vitamins, herbs, eye drops,  creams, and over-the-counter medicines. Any problems you or family members have had with anesthesia. Any bleeding problems you have. Any surgeries you have had. Any medical conditions or illnesses you have. This includes sleep apnea, cough, fever, or the flu. Whether you are pregnant or may be pregnant. Whether you use cigarettes, alcohol, or drugs. Any use of steroids, whether by mouth or as a cream. What are the risks? Your health care provider will talk with you about risks. These may include: Getting too much medicine (oversedation). Nausea. Allergic reactions to medicines. Trouble breathing. If this happens, a breathing tube may be used to help you breathe. It will be removed when you are awake and breathing on your own. Heart trouble. Lung trouble. Confusion that gets better with time (emergence delirium). What happens before the procedure? When to stop eating and drinking Follow instructions from your health care provider about what you may eat and drink. These may include: 8 hours before your procedure Stop eating most foods. Do not eat meat, fried foods, or fatty foods. Eat only light foods, such as toast or crackers. All liquids are okay except energy drinks and alcohol. 6 hours before your procedure Stop eating. Drink only clear liquids, such as water, clear fruit juice, black coffee, plain tea, and sports drinks. Do not drink energy drinks or alcohol. 2 hours before your procedure Stop drinking all liquids. You may be allowed to take medicines with small sips of water. If you do not follow your health care provider's instructions, your procedure may be delayed or canceled. Medicines Ask your health care provider about: Changing or stopping your regular medicines. These include any diabetes medicines or blood thinners you take. Taking medicines such as aspirin and ibuprofen. These medicines can thin your blood. Do not take them unless your health care provider tells you  to. Taking over-the-counter medicines, vitamins, herbs, and supplements. Testing You may have an exam or testing. You may have a blood or urine sample taken. General instructions Do not use any products that contain nicotine or tobacco for at least 4 weeks before the procedure. These products include cigarettes, chewing tobacco, and vaping devices, such as e-cigarettes. If you need help quitting, ask your health care provider. If you will be going home right after the procedure, plan to have a responsible adult: Take you home from the hospital or clinic. You will not be allowed to drive. Care for you for the time you are told. What happens during the procedure?  Your blood pressure, heart rate, breathing, level of pain, and blood oxygen level will be monitored. An IV will be inserted into one of your veins. You may be given: A sedative. This helps you relax. Anesthesia. This will: Numb certain areas of your body. Make you  fall asleep for surgery. You will be given medicines as needed to keep you comfortable. The more medicine you are given, the deeper your level of sedation will be. Your level of sedation may be changed to fit your needs. There are three levels of sedation: Mild sedation. At this level, you may feel awake and relaxed. You will be able to follow directions. Moderate sedation. At this level, you will be sleepy. You may not remember the procedure. Deep sedation. At this level, you will be asleep. You will not remember the procedure. How you get the medicines will depend on your age and the procedure. They may be given as: A pill. This may be taken by mouth (orally) or inserted into the rectum. An injection. This may be into a vein or muscle. A spray through the nose. After your procedure is over, the medicine will be stopped. The procedure may vary among health care providers and hospitals. What happens after the procedure? Your blood pressure, heart rate, breathing rate,  and blood oxygen level will be monitored until you leave the hospital or clinic. You may feel sleepy, clumsy, or nauseous. You may not remember what happened during or after the procedure. Sedation can affect you for several hours. Do not drive or use machinery until your health care provider says that it is safe. This information is not intended to replace advice given to you by your health care provider. Make sure you discuss any questions you have with your health care provider. Document Revised: 04/21/2022 Document Reviewed: 04/21/2022 Elsevier Patient Education  2024 [employer].

## 2023-10-14 ENCOUNTER — Ambulatory Visit (HOSPITAL_COMMUNITY)
Admission: RE | Admit: 2023-10-14 | Discharge: 2023-10-14 | Disposition: A | Discharge status: Home / Self Care | Payer: Medicaid Other | Attending: Internal Medicine | Admitting: Internal Medicine

## 2023-10-14 ENCOUNTER — Encounter (HOSPITAL_COMMUNITY): Payer: Self-pay | Admitting: Internal Medicine

## 2023-10-14 ENCOUNTER — Ambulatory Visit (HOSPITAL_COMMUNITY): Payer: Self-pay | Admitting: Certified Registered Nurse Anesthetist

## 2023-10-14 ENCOUNTER — Encounter (HOSPITAL_COMMUNITY)
Admission: RE | Disposition: A | Discharge status: Home / Self Care | Payer: Self-pay | Source: Home / Self Care | Attending: Internal Medicine

## 2023-10-14 DIAGNOSIS — K2289 Other specified disease of esophagus: Secondary | ICD-10-CM

## 2023-10-14 DIAGNOSIS — J45909 Unspecified asthma, uncomplicated: Secondary | ICD-10-CM | POA: Insufficient documentation

## 2023-10-14 DIAGNOSIS — D122 Benign neoplasm of ascending colon: Secondary | ICD-10-CM | POA: Insufficient documentation

## 2023-10-14 DIAGNOSIS — K449 Diaphragmatic hernia without obstruction or gangrene: Secondary | ICD-10-CM

## 2023-10-14 DIAGNOSIS — R519 Headache, unspecified: Secondary | ICD-10-CM | POA: Insufficient documentation

## 2023-10-14 DIAGNOSIS — F32A Depression, unspecified: Secondary | ICD-10-CM | POA: Insufficient documentation

## 2023-10-14 DIAGNOSIS — Z5941 Food insecurity: Secondary | ICD-10-CM | POA: Insufficient documentation

## 2023-10-14 DIAGNOSIS — R1310 Dysphagia, unspecified: Secondary | ICD-10-CM

## 2023-10-14 DIAGNOSIS — F419 Anxiety disorder, unspecified: Secondary | ICD-10-CM | POA: Diagnosis not present

## 2023-10-14 DIAGNOSIS — Z860101 Personal history of adenomatous and serrated colon polyps: Secondary | ICD-10-CM

## 2023-10-14 DIAGNOSIS — D126 Benign neoplasm of colon, unspecified: Secondary | ICD-10-CM | POA: Diagnosis not present

## 2023-10-14 DIAGNOSIS — G4730 Sleep apnea, unspecified: Secondary | ICD-10-CM | POA: Diagnosis not present

## 2023-10-14 DIAGNOSIS — Z860100 Personal history of colon polyps, unspecified: Secondary | ICD-10-CM | POA: Diagnosis not present

## 2023-10-14 DIAGNOSIS — Z8673 Personal history of transient ischemic attack (TIA), and cerebral infarction without residual deficits: Secondary | ICD-10-CM | POA: Insufficient documentation

## 2023-10-14 DIAGNOSIS — Z5982 Transportation insecurity: Secondary | ICD-10-CM | POA: Insufficient documentation

## 2023-10-14 DIAGNOSIS — Z7984 Long term (current) use of oral hypoglycemic drugs: Secondary | ICD-10-CM | POA: Insufficient documentation

## 2023-10-14 DIAGNOSIS — R630 Anorexia: Secondary | ICD-10-CM

## 2023-10-14 DIAGNOSIS — I10 Essential (primary) hypertension: Secondary | ICD-10-CM

## 2023-10-14 DIAGNOSIS — A048 Other specified bacterial intestinal infections: Secondary | ICD-10-CM | POA: Diagnosis not present

## 2023-10-14 DIAGNOSIS — B9681 Helicobacter pylori [H. pylori] as the cause of diseases classified elsewhere: Secondary | ICD-10-CM | POA: Diagnosis not present

## 2023-10-14 DIAGNOSIS — Z5986 Financial insecurity: Secondary | ICD-10-CM | POA: Insufficient documentation

## 2023-10-14 DIAGNOSIS — K219 Gastro-esophageal reflux disease without esophagitis: Secondary | ICD-10-CM | POA: Diagnosis not present

## 2023-10-14 DIAGNOSIS — K2950 Unspecified chronic gastritis without bleeding: Secondary | ICD-10-CM

## 2023-10-14 DIAGNOSIS — E119 Type 2 diabetes mellitus without complications: Secondary | ICD-10-CM | POA: Diagnosis not present

## 2023-10-14 DIAGNOSIS — Z1211 Encounter for screening for malignant neoplasm of colon: Secondary | ICD-10-CM

## 2023-10-14 DIAGNOSIS — H409 Unspecified glaucoma: Secondary | ICD-10-CM | POA: Insufficient documentation

## 2023-10-14 HISTORY — PX: POLYPECTOMY: SHX149

## 2023-10-14 HISTORY — PX: COLONOSCOPY WITH PROPOFOL: SHX5780

## 2023-10-14 HISTORY — PX: MALONEY DILATION: SHX5535

## 2023-10-14 HISTORY — PX: ESOPHAGOGASTRODUODENOSCOPY (EGD) WITH PROPOFOL: SHX5813

## 2023-10-14 HISTORY — PX: BIOPSY: SHX5522

## 2023-10-14 LAB — GLUCOSE, CAPILLARY: Glucose-Capillary: 130 mg/dL — ABNORMAL HIGH (ref 70–99)

## 2023-10-14 SURGERY — COLONOSCOPY WITH PROPOFOL
Anesthesia: General

## 2023-10-14 MED ORDER — LIDOCAINE HCL (PF) 2 % IJ SOLN
INTRAMUSCULAR | Status: AC
  Filled 2023-10-14: qty 5

## 2023-10-14 MED ORDER — PROPOFOL 10 MG/ML IV BOLUS
INTRAVENOUS | Status: DC | PRN
  Administered 2023-10-14: 80 mg via INTRAVENOUS

## 2023-10-14 MED ORDER — SODIUM CHLORIDE 0.9% FLUSH: 10.0000 mL | Freq: Two times a day (BID) | INTRAVENOUS | Status: DC

## 2023-10-14 MED ORDER — PROPOFOL 500 MG/50ML IV EMUL
INTRAVENOUS | Status: AC
  Filled 2023-10-14: qty 50

## 2023-10-14 MED ORDER — PROPOFOL 500 MG/50ML IV EMUL
INTRAVENOUS | Status: DC | PRN
  Administered 2023-10-14: 180 ug/kg/min via INTRAVENOUS

## 2023-10-14 MED ORDER — LIDOCAINE HCL (CARDIAC) PF 100 MG/5ML IV SOSY
PREFILLED_SYRINGE | INTRAVENOUS | Status: DC | PRN
Start: 2023-10-14 — End: 2023-10-14
  Administered 2023-10-14: 50 mg via INTRAVENOUS

## 2023-10-14 MED ORDER — LACTATED RINGERS IV SOLN: INTRAVENOUS | Status: DC | PRN

## 2023-10-14 NOTE — Anesthesia Preprocedure Evaluation (Signed)
Anesthesia Evaluation  Patient identified by MRN, date of birth, ID band Patient awake    Reviewed: Allergy & Precautions, H&P , NPO status , Patient's Chart, lab work & pertinent test results, reviewed documented beta blocker date and time   Airway Mallampati: II  TM Distance: >3 FB Neck ROM: full    Dental no notable dental hx.    Pulmonary neg pulmonary ROS, asthma , sleep apnea , COPD   Pulmonary exam normal breath sounds clear to auscultation       Cardiovascular Exercise Tolerance: Good hypertension, negative cardio ROS  Rhythm:regular Rate:Normal     Neuro/Psych  Headaches PSYCHIATRIC DISORDERS Anxiety Depression     Neuromuscular disease CVA negative neurological ROS  negative psych ROS   GI/Hepatic negative GI ROS, Neg liver ROS,GERD  ,,  Endo/Other  negative endocrine ROSdiabetes    Renal/GU negative Renal ROS  negative genitourinary   Musculoskeletal   Abdominal   Peds  Hematology negative hematology ROS (+) Blood dyscrasia, anemia   Anesthesia Other Findings   Reproductive/Obstetrics negative OB ROS                             Anesthesia Physical Anesthesia Plan  ASA: 3  Anesthesia Plan: General   Post-op Pain Management:    Induction:   PONV Risk Score and Plan: Propofol infusion  Airway Management Planned:   Additional Equipment:   Intra-op Plan:   Post-operative Plan:   Informed Consent: I have reviewed the patients History and Physical, chart, labs and discussed the procedure including the risks, benefits and alternatives for the proposed anesthesia with the patient or authorized representative who has indicated his/her understanding and acceptance.     Dental Advisory Given  Plan Discussed with: CRNA  Anesthesia Plan Comments:        Anesthesia Quick Evaluation

## 2023-10-14 NOTE — Transfer of Care (Signed)
Immediate Anesthesia Transfer of Care Note  Patient: Kayla Choi  Procedure(s) Performed: COLONOSCOPY WITH PROPOFOL ESOPHAGOGASTRODUODENOSCOPY (EGD) WITH PROPOFOL MALONEY DILATION BIOPSY POLYPECTOMY INTESTINAL  Patient Location: Short Stay  Anesthesia Type:General  Level of Consciousness: awake, alert , and oriented  Airway & Oxygen Therapy: Patient Spontanous Breathing  Post-op Assessment: Report given to RN, Post -op Vital signs reviewed and stable, Patient moving all extremities X 4, and Patient able to stick tongue midline  Post vital signs: Reviewed and stable  Last Vitals:  Vitals Value Taken Time  BP 123/88   Temp 97.5   Pulse 105   Resp 20   SpO2 99     Last Pain:  Vitals:   10/14/23 0730  TempSrc:   PainSc: 0-No pain         Complications: No notable events documented.

## 2023-10-14 NOTE — Op Note (Signed)
[facility] Patient Name: Kayla Choi Procedure Date: 10/14/2023 7:10 AM MRN: [id number] Date of Birth: 06/27/1966 Attending MD: Gennette Pac , MD, [id number] CSN: [id number] Age: 57 Admit Type: Outpatient Procedure:                Colonoscopy Indications:              High risk colon cancer surveillance: Personal                            history of colonic polyps Providers:                Gennette Pac, MD, Crystal Page, Elinor Parkinson Referring MD:              Medicines:                Propofol per Anesthesia Complications:            No immediate complications. Estimated Blood Loss:     Estimated blood loss was minimal. Procedure:                Pre-Anesthesia Assessment:                           - Prior to the procedure, a History and Physical                            was performed, and patient medications and                            allergies were reviewed. The patient's tolerance of                            previous anesthesia was also reviewed. The risks                            and benefits of the procedure and the sedation                            options and risks were discussed with the patient.                            All questions were answered, and informed consent                            was obtained. Prior Anticoagulants: The patient                            last took Plavix (clopidogrel) 1 day prior to the                            procedure. ASA Grade Assessment: III - A patient  with severe systemic disease. After reviewing the                            risks and benefits, the patient was deemed in                            satisfactory condition to undergo the procedure.                           After obtaining informed consent, the colonoscope                            was passed under direct vision. Throughout the                            procedure, the  patient's blood pressure, pulse, and                            oxygen saturations were monitored continuously. The                            [id number]) scope was introduced through the                            anus and advanced to the the cecum, identified by                            appendiceal orifice and ileocecal valve. The                            colonoscopy was performed without difficulty. The                            patient tolerated the procedure well. The quality                            of the bowel preparation was adequate. The                            ileocecal valve, appendiceal orifice, and rectum                            were photographed. Scope In: 7:49:05 AM Scope Out: 8:05:41 AM Scope Withdrawal Time: 0 hours 12 minutes 41 seconds  Total Procedure Duration: 0 hours 16 minutes 36 seconds  Findings:      The perianal and digital rectal examinations were normal.      Two sessile polyps were found in the descending colon and ascending       colon. The polyps were 3 to 5 mm in size. These polyps were removed with       a cold snare. Resection and retrieval were complete. Estimated blood       loss was minimal.      The exam was otherwise without abnormality on direct and retroflexion       views.  Impression:               - Two 3 to 5 mm polyps in the descending colon and                            in the ascending colon, removed with a cold snare.                            Resected and retrieved.                           - The examination was otherwise normal on direct                            and retroflexion views. Moderate Sedation:      Moderate (conscious) sedation was personally administered by an       anesthesia professional. The following parameters were monitored: oxygen       saturation, heart rate, blood pressure, respiratory rate, EKG, adequacy       of pulmonary ventilation, and response to care. Recommendation:           -  Patient has a contact number available for                            emergencies. The signs and symptoms of potential                            delayed complications were discussed with the                            patient. Return to normal activities tomorrow.                            Written discharge instructions were provided to the                            patient.                           - Advance diet as tolerated.                           - Continue present medications.                           - Repeat colonoscopy date to be determined after                            pending pathology results are reviewed for                            surveillance.                           - Return to GI office in 6 weeks. See EGD report Procedure Code(s):        --- Professional ---  24401, Colonoscopy, flexible; with removal of                            tumor(s), polyp(s), or other lesion(s) by snare                            technique Diagnosis Code(s):        --- Professional ---                           Z86.010, Personal history of colonic polyps                           D12.4, Benign neoplasm of descending colon                           D12.2, Benign neoplasm of ascending colon CPT copyright 2022 [employer]. All rights reserved. The codes documented in this report are preliminary and upon coder review may  be revised to meet current compliance requirements. Gerrit Friends. Takuya Lariccia, MD Gennette Pac, MD 10/14/2023 8:18:44 AM This report has been signed electronically. Number of Addenda: 0

## 2023-10-14 NOTE — Op Note (Signed)
[facility] Patient Name: Kayla Choi Procedure Date: 10/14/2023 7:14 AM MRN: [id number] Date of Birth: Jan 28, 1966 Attending MD: Gennette Pac , MD, [id number] CSN: [id number] Age: 57 Admit Type: Outpatient Procedure:                Upper GI endoscopy Indications:              Dysphagia Providers:                Gennette Pac, MD, Crystal Page, Elinor Parkinson Referring MD:              Medicines:                Propofol per Anesthesia Complications:            No immediate complications. Estimated Blood Loss:     Estimated blood loss was minimal. Procedure:                Pre-Anesthesia Assessment:                           - Prior to the procedure, a History and Physical                            was performed, and patient medications and                            allergies were reviewed. The patient's tolerance of                            previous anesthesia was also reviewed. The risks                            and benefits of the procedure and the sedation                            options and risks were discussed with the patient.                            All questions were answered, and informed consent                            was obtained. Prior Anticoagulants: The patient has                            taken no anticoagulant or antiplatelet agents and                            last took Plavix (clopidogrel) 1 day prior to the                            procedure. ASA Grade Assessment: III - A patient  with severe systemic disease. After reviewing the                            risks and benefits, the patient was deemed in                            satisfactory condition to undergo the procedure.                           After obtaining informed consent, the endoscope was                            passed under direct vision. Throughout the                            procedure, the  patient's blood pressure, pulse, and                            oxygen saturations were monitored continuously. The                            GIF-H190 ([id number]) scope was introduced through the                            mouth, and advanced to the second part of duodenum.                            The upper GI endoscopy was accomplished without                            difficulty. The patient tolerated the procedure                            well. Scope In: 7:34:56 AM Scope Out: 7:43:14 AM Total Procedure Duration: 0 hours 8 minutes 18 seconds  Findings:      Slight nodularity erosions at the EG junction. Tubular esophagus patent       throughout its course. No mass no Barrett's esophagus.      A small hiatal hernia was present. Erythema and slight edema of the       antrum and gastric body without ulcer or infiltrating process observed.       Pylorus patent.      The duodenal bulb and second portion of the duodenum were normal. Scope       was withdrawn and a 54 [place] Maloney dilator was passed with mild       resistance. A look back revealed no change related to this maneuver.       There was no apparent complication or bleeding. Finally, biopsies of the       abnormal gastric mucosa and distal esophagus were taken for histologic       study. Impression:               - Slight nodularity and erosion of the distal  esophagus. Status post Elease Hashimoto dilation followed by                            biopsy                           - Small hiatal hernia. Abnormal gastric mucosa of                            uncertain significance?"status post biopsy                           - Normal duodenal bulb and second portion of the                            duodenum. Moderate Sedation:      Moderate (conscious) sedation was personally administered by an       anesthesia professional. The following parameters were monitored: oxygen       saturation, heart rate, blood  pressure, respiratory rate, EKG, adequacy       of pulmonary ventilation, and response to care. Recommendation:           - Patient has a contact number available for                            emergencies. The signs and symptoms of potential                            delayed complications were discussed with the                            patient. Return to normal activities tomorrow.                            Written discharge instructions were provided to the                            patient.                           - Advance diet as tolerated.                           - Continue present medications.                           - Return to my office in 6 weeks. See colonoscopy                            report. Procedure Code(s):        --- Professional ---                           [phone], Esophagogastroduodenoscopy, flexible,                            transoral; diagnostic, including collection of  specimen(s) by brushing or washing, when performed                            (separate procedure) Diagnosis Code(s):        --- Professional ---                           K44.9, Diaphragmatic hernia without obstruction or                            gangrene                           R13.10, Dysphagia, unspecified CPT copyright 2022 American Medical Association. All rights reserved. The codes documented in this report are preliminary and upon coder review may  be revised to meet current compliance requirements. Kayla Choi. Kayla Vessey, MD Gennette Pac, MD 10/14/2023 8:15:34 AM This report has been signed electronically. Number of Addenda: 0

## 2023-10-14 NOTE — Discharge Instructions (Addendum)
EGD Discharge instructions Please read the instructions outlined below and refer to this sheet in the next few weeks. These discharge instructions provide you with general information on caring for yourself after you leave the hospital. Your doctor may also give you specific instructions. While your treatment has been planned according to the most current medical practices available, unavoidable complications occasionally occur. If you have any problems or questions after discharge, please call your doctor. ACTIVITY You may resume your regular activity but move at a slower pace for the next 24 hours.  Take frequent rest periods for the next 24 hours.  Walking will help expel (get rid of) the air and reduce the bloated feeling in your abdomen.  No driving for 24 hours (because of the anesthesia (medicine) used during the test).  You may shower.  Do not sign any important legal documents or operate any machinery for 24 hours (because of the anesthesia used during the test).  NUTRITION Drink plenty of fluids.  You may resume your normal diet.  Begin with a light meal and progress to your normal diet.  Avoid alcoholic beverages for 24 hours or as instructed by your caregiver.  MEDICATIONS You may resume your normal medications unless your caregiver tells you otherwise.  WHAT YOU CAN EXPECT TODAY You may experience abdominal discomfort such as a feeling of fullness or "gas" pains.  FOLLOW-UP Your doctor will discuss the results of your test with you.  SEEK IMMEDIATE MEDICAL ATTENTION IF ANY OF THE FOLLOWING OCCUR: Excessive nausea (feeling sick to your stomach) and/or vomiting.  Severe abdominal pain and distention (swelling).  Trouble swallowing.  Temperature over 101 F (37.8 C).  Rectal bleeding or vomiting of blood.     Colonoscopy Discharge Instructions  Read the instructions outlined below and refer to this sheet in the next few weeks. These discharge instructions provide you with  general information on caring for yourself after you leave the hospital. Your doctor may also give you specific instructions. While your treatment has been planned according to the most current medical practices available, unavoidable complications occasionally occur. If you have any problems or questions after discharge, call Dr. Jena Gauss at [phone]. ACTIVITY You may resume your regular activity, but move at a slower pace for the next 24 hours.  Take frequent rest periods for the next 24 hours.  Walking will help get rid of the air and reduce the bloated feeling in your belly (abdomen).  No driving for 24 hours (because of the medicine (anesthesia) used during the test).   Do not sign any important legal documents or operate any machinery for 24 hours (because of the anesthesia used during the test).  NUTRITION Drink plenty of fluids.  You may resume your normal diet as instructed by your doctor.  Begin with a light meal and progress to your normal diet. Heavy or fried foods are harder to digest and may make you feel sick to your stomach (nauseated).  Avoid alcoholic beverages for 24 hours or as instructed.  MEDICATIONS You may resume your normal medications unless your doctor tells you otherwise.  WHAT YOU CAN EXPECT TODAY Some feelings of bloating in the abdomen.  Passage of more gas than usual.  Spotting of blood in your stool or on the toilet paper.  IF YOU HAD POLYPS REMOVED DURING THE COLONOSCOPY: No aspirin products for 7 days or as instructed.  No alcohol for 7 days or as instructed.  Eat a soft diet for the next 24 hours.  FINDING OUT THE RESULTS OF YOUR TEST Not all test results are available during your visit. If your test results are not back during the visit, make an appointment with your caregiver to find out the results. Do not assume everything is normal if you have not heard from your caregiver or the medical facility. It is important for you to follow up on all of your test  results.  SEEK IMMEDIATE MEDICAL ATTENTION IF: You have more than a spotting of blood in your stool.  Your belly is swollen (abdominal distention).  You are nauseated or vomiting.  You have a temperature over 101.  You have abdominal pain or discomfort that is severe or gets worse throughout the day.     You have a hiatal hernia and an inflamed stomach.  Your esophagus was stretched.  Biopsies were taken.  2 polyps removed from your colon  Further recommendations to follow after pathology report has returned  Office visit with Tana Coast in 6 weeks  ~At patient request, I called Courtney Paris at [phone] -

## 2023-10-14 NOTE — H&P (Signed)
@LOGO @   Primary Care Physician:  Rica Records, FNP Primary Gastroenterologist:  Dr. Jena Gauss  Pre-Procedure History & Physical: HPI:  Kayla Choi is a 57 y.o. female here for further evaluation of dysphagia diminished appetite via EGD and possible esophageal dilation is feasible/appropriate as well as surveillance colonoscopy given history of multiple Colonic adenomas removed previously.  History of CVA last year remains on clopidogrel.  Past Medical History:  Diagnosis Date   Acid reflux    ADD (attention deficit disorder)    Anemia    iron deficinecy   Anxiety    with social phobia   Asthma    Balance problem    Chest pain    that occurs with anxiety   COPD (chronic obstructive pulmonary disease) (HCC)    Depression    Diabetes mellitus    Glaucoma    Hyperlipidemia    Hypertension    Normal cardiac stress test 12/09/2015   low risk study   Panic attack    Prediabetes    Recurrent falls    Sleep apnea    Social phobia    Stroke [facility]) 11/2022   ministroke    Past Surgical History:  Procedure Laterality Date   ABDOMINAL HYSTERECTOMY     fibroids, both ovaries left intact   ANKLE SURGERY     CHOLECYSTECTOMY     COLONOSCOPY N/A 10/18/2019   Procedure: COLONOSCOPY;  Surgeon: Corbin Ade, MD;  Location: [facility];  Service: Endoscopy;  Laterality: N/A;  1:00-office rescheduled to 11/10 @ 12:45pm   COSMETIC SURGERY     elbow   FRACTURE SURGERY     Recurrent elbow surgery   LIPOMA EXCISION  04/30/2012   Procedure: EXCISION LIPOMA;  Surgeon: Fabio Bering, MD;  Location: [facility];  Service: General;  Laterality: Right;  Excision soft tissue mass right thigh   MASS EXCISION Right 12/19/2020   Procedure: EXCISION CYST; 2 CM; AXILLA;  Surgeon: Lucretia Roers, MD;  Location: [facility];  Service: General;  Laterality: Right;   ORIF ANKLE FRACTURE Right 12/15/2017   Procedure: OPEN REDUCTION INTERNAL FIXATION (ORIF) RIGHT ANKLE FRACTURE;  Surgeon: Sheral Apley, MD;  Location: [facility];  Service: Orthopedics;  Laterality: Right;   POLYPECTOMY  10/18/2019   Procedure: POLYPECTOMY;  Surgeon: Corbin Ade, MD;  Location: [facility];  Service: Endoscopy;;  colon   right elbow     reconstruction    Prior to Admission medications   Medication Sig Start Date End Date Taking? Authorizing Provider  albuterol (PROVENTIL) (2.5 MG/3ML) 0.083% nebulizer solution Take 3 mLs (2.5 mg total) by nebulization every 4 (four) hours as needed for wheezing or shortness of breath. 04/23/20  Yes Bates, Crystal A, FNP  albuterol (VENTOLIN HFA) 108 (90 Base) MCG/ACT inhaler Inhale 2 puffs into the lungs every 4 (four) hours as needed for wheezing or shortness of breath. 03/23/20  Yes [place], Velna Hatchet, MD  amLODipine (NORVASC) 10 MG tablet Take 1 tablet (10 mg total) by mouth daily. 07/15/23  Yes Del Newman Nip, Tenna Child, FNP  clopidogrel (PLAVIX) 75 MG tablet Take 1 tablet (75 mg total) by mouth daily. 07/15/23  Yes Del Newman Nip, Tenna Child, FNP  gabapentin (NEURONTIN) 300 MG capsule Take 300 mg by mouth 3 (three) times daily. 10/09/22  Yes [provider]  glimepiride (AMARYL) 2 MG tablet Take 1 tablet (2 mg total) by mouth daily before breakfast. 07/27/23  Yes Del Nigel Berthold, FNP  lisinopril-hydrochlorothiazide (ZESTORETIC) 20-12.5 MG tablet Take 1 tablet by mouth daily. 07/27/23  Yes Del Newman Nip, Tenna Child, FNP  lubiprostone (AMITIZA) 24 MCG capsule Take 1 capsule (24 mcg total) by mouth 2 (two) times daily with a meal. 07/15/23  Yes Tiffany Kocher, PA-C  mirtazapine (REMERON) 7.5 MG tablet Take 1 tablet (7.5 mg total) by mouth at bedtime. 07/27/23  Yes Del Newman Nip, Tenna Child, FNP  pantoprazole (PROTONIX) 40 MG tablet Take 1 tablet (40 mg total) by mouth daily before breakfast. 07/15/23  Yes Tiffany Kocher, PA-C  rosuvastatin (CRESTOR) 40 MG tablet Take 1 tablet (40 mg total) by mouth at bedtime. 08/02/23  Yes Del Newman Nip,  Tenna Child, FNP  sitaGLIPtin (JANUVIA) 25 MG tablet Take 1 tablet (25 mg total) by mouth daily. 07/27/23  Yes Del Newman Nip, Tenna Child, FNP  topiramate (TOPAMAX) 50 MG tablet Take 1 tablet (50 mg total) by mouth 2 (two) times daily. 06/04/23  Yes Glean Salvo, NP  Blood Glucose Monitoring Suppl DEVI 1 each by Does not apply route in the morning, at noon, and at bedtime. May substitute to any manufacturer covered by patient's insurance. 07/01/23   Del Nigel Berthold, FNP  Cholecalciferol (VITAMIN D3) 50 MCG (2000 UT) capsule Take 1 capsule (2,000 Units total) by mouth daily. 04/16/23   Del Nigel Berthold, FNP  clobetasol cream (TEMOVATE) 0.05 % Apply 1 Application topically 2 (two) times daily. 04/16/23   Del Nigel Berthold, FNP    Allergies as of 08/27/2023 - Review Complete 08/06/2023  Allergen Reaction Noted   Aspirin Shortness Of Breath and Itching 09/03/2011   Metformin and related Anaphylaxis 04/02/2012   Other Anaphylaxis 04/15/2012   Oxycodone Itching 06/25/2014   Diphenhydramine hcl Rash 03/28/2014   Latex Itching and Rash 01/29/2007   Neosporin [neomycin-bacitracin zn-polymyx] Swelling, Rash, and Other (See Comments) 04/15/2012    Family History  Problem Relation Age of Onset   Hypertension Mother    Diabetes Father    Heart disease Father    Anesthesia problems Neg Hx    Malignant hyperthermia Neg Hx    Hypotension Neg Hx    Pseudochol deficiency Neg Hx     Social History   Socioeconomic History   Marital status: Single    Spouse name: Not on file   Number of children: 2   Years of education: Not on file   Highest education level: Some college, no degree  Occupational History   Not on file  Tobacco Use   Smoking status: Never   Smokeless tobacco: Never  Vaping Use   Vaping status: Never Used  Substance and Sexual Activity   Alcohol use: No    Alcohol/week: 0.0 standard drinks of alcohol   Drug use: No   Sexual activity: Yes    Birth  control/protection: Surgical  Other Topics Concern   Not on file  Social History Narrative   Not on file   Social Determinants of Health   Financial Resource Strain: High Risk (07/27/2023)   Overall Financial Resource Strain (CARDIA)    Difficulty of Paying Living Expenses: Hard  Food Insecurity: Food Insecurity Present (07/27/2023)   Hunger Vital Sign    Worried About Running Out of Food in the Last Year: Sometimes true    Ran Out of Food in the Last Year: Sometimes true  Transportation Needs: Unmet Transportation Needs (07/27/2023)   PRAPARE - Administrator, Civil Service (Medical): Yes    Lack of Transportation (Non-Medical):  Yes  Physical Activity: Insufficiently Active (07/27/2023)   Exercise Vital Sign    Days of Exercise per Week: 4 days    Minutes of Exercise per Session: 10 min  Stress: Stress Concern Present (07/27/2023)   [employer] of Occupational Health - Occupational Stress Questionnaire    Feeling of Stress : To some extent  Social Connections: Moderately Isolated (07/27/2023)   Social Connection and Isolation Panel [NHANES]    Frequency of Communication with Friends and Family: More than three times a week    Frequency of Social Gatherings with Friends and Family: Once a week    Attends Religious Services: Never    Database administrator or Organizations: No    Attends Engineer, structural: Not on file    Marital Status: Living with partner  Intimate Partner Violence: Not At Risk (12/02/2022)   Humiliation, Afraid, Rape, and Kick questionnaire    Fear of Current or Ex-Partner: No    Emotionally Abused: No    Physically Abused: No    Sexually Abused: No    Review of Systems: See HPI, otherwise negative ROS  Physical Exam: BP (!) 156/87   Pulse 77   Temp 98.2 F (36.8 C) (Oral)   Resp 13   Ht 5\' 1"  (1.549 m)   Wt 73.9 kg   SpO2 100%   BMI 30.78 kg/m  General:   Alert,  Well-developed, well-nourished, pleasant and cooperative  in NAD Neck:  Supple; no masses or thyromegaly. No significant cervical adenopathy. Lungs:  Clear throughout to auscultation.   No wheezes, crackles, or rhonchi. No acute distress. Heart:  Regular rate and rhythm; no murmurs, clicks, rubs,  or gallops. Abdomen: Non-distended, normal bowel sounds.  Soft and nontender without appreciable mass or hepatosplenomegaly.   Impression/Plan: 57 year old lady with history of CVA with vague dysphagia diminished oral intake history of multiple colonic adenomas removed previously here for EGD with esophageal dilation is feasible/appropriate as well as a surveillance colonoscopy per plan. The risks, benefits, limitations, imponderables and alternatives regarding both EGD and colonoscopy have been reviewed with the patient. Questions have been answered. All parties agreeable.       Notice: This dictation was prepared with Dragon dictation along with smaller phrase technology. Any transcriptional errors that result from this process are unintentional and may not be corrected upon review.

## 2023-10-15 NOTE — Anesthesia Postprocedure Evaluation (Signed)
Anesthesia Post Note  Patient: Eufemia Prindle  Procedure(s) Performed: COLONOSCOPY WITH PROPOFOL ESOPHAGOGASTRODUODENOSCOPY (EGD) WITH PROPOFOL MALONEY DILATION BIOPSY POLYPECTOMY INTESTINAL  Patient location during evaluation: Phase II Anesthesia Type: General Level of consciousness: awake Pain management: pain level controlled Vital Signs Assessment: post-procedure vital signs reviewed and stable Respiratory status: spontaneous breathing and respiratory function stable Cardiovascular status: blood pressure returned to baseline and stable Postop Assessment: no headache and no apparent nausea or vomiting Anesthetic complications: no Comments: Late entry   No notable events documented.   Last Vitals:  Vitals:   10/14/23 0635 10/14/23 0811  BP: (!) 156/87 123/88  Pulse: 77 (!) 105  Resp: 13 (!) 21  Temp: 36.8 C (!) 36.4 C  SpO2: 100% 100%    Last Pain:  Vitals:   10/14/23 0811  TempSrc: Oral  PainSc: 0-No pain                 Windell Norfolk

## 2023-10-16 LAB — SURGICAL PATHOLOGY

## 2023-10-20 ENCOUNTER — Encounter (HOSPITAL_COMMUNITY): Payer: Self-pay | Admitting: Internal Medicine

## 2023-10-20 ENCOUNTER — Encounter: Payer: Self-pay | Admitting: Internal Medicine

## 2023-10-21 ENCOUNTER — Other Ambulatory Visit: Payer: Self-pay

## 2023-10-21 MED ORDER — TETRACYCLINE HCL 500 MG PO CAPS: 500.0000 mg | ORAL_CAPSULE | Freq: Three times a day (TID) | ORAL | 0 refills | Status: DC

## 2023-10-21 MED ORDER — BISMUTH/METRONIDAZ/TETRACYCLIN 140-125-125 MG PO CAPS: 3.0000 | ORAL_CAPSULE | Freq: Four times a day (QID) | ORAL | 0 refills | Status: DC

## 2023-10-21 MED ORDER — METRONIDAZOLE 500 MG PO TABS: 500.0000 mg | ORAL_TABLET | Freq: Three times a day (TID) | ORAL | 0 refills | Status: DC

## 2023-10-21 MED ORDER — BISMUTH 262 MG PO CHEW: CHEWABLE_TABLET | ORAL | 0 refills | Status: DC

## 2023-10-29 NOTE — Progress Notes (Signed)
Established Patient Office Visit   Subjective  Patient ID: Kayla Choi, female    DOB: 03-04-1966  Age: 57 y.o. MRN: [id number]  Chief Complaint  Patient presents with   Medical Management of Chronic Issues    Depression, type 2 diabetes, hypertension.  Trouble with vision    Kayla Choi  has a past medical history of Acid reflux, ADD (attention deficit disorder), Anemia, Anxiety, Asthma, Balance problem, Chest pain, COPD (chronic obstructive pulmonary disease) (HCC), Depression, Diabetes mellitus, Glaucoma, Hyperlipidemia, Hypertension, Normal cardiac stress test (12/09/2015), Panic attack, Prediabetes, Recurrent falls, Sleep apnea, Social phobia, and Stroke (HCC) (11/2022).  HPI Patient presents to the clinic for chronic follow up. For the details of today's visit, please refer to assessment and plan.   Review of Systems  Constitutional:  Negative for chills and fever.  Eyes:  Negative for blurred vision.  Respiratory:  Negative for shortness of breath.   Cardiovascular:  Negative for chest pain.  Gastrointestinal:  Negative for abdominal pain.  Genitourinary:  Negative for dysuria.  Musculoskeletal:  Negative for joint pain.  Neurological:  Positive for headaches.      Objective:     BP (!) 160/96   Pulse (!) 108   Ht 5\' 1"  (1.549 m)   Wt 157 lb 1.3 oz (71.3 kg)   SpO2 98%   BMI 29.68 kg/m  BP Readings from Last 3 Encounters:  10/30/23 (!) 160/96  10/14/23 123/88  07/30/23 (!) 172/90      Physical Exam Vitals reviewed.  Constitutional:      General: Kayla Choi is not in acute distress.    Appearance: Normal appearance. Kayla Choi is not ill-appearing, toxic-appearing or diaphoretic.  HENT:     Head: Normocephalic.  Eyes:     General:        Right eye: No discharge.        Left eye: No discharge.     Conjunctiva/sclera: Conjunctivae normal.  Cardiovascular:     Rate and Rhythm: Normal rate.     Pulses: Normal pulses.     Heart sounds: Normal heart sounds.  Pulmonary:      Effort: Pulmonary effort is normal. No respiratory distress.     Breath sounds: Normal breath sounds.  Musculoskeletal:        General: Normal range of motion.     Cervical back: Normal range of motion.  Skin:    General: Skin is warm and dry.     Capillary Refill: Capillary refill takes less than 2 seconds.  Neurological:     Mental Status: Kayla Choi is alert.     Coordination: Coordination normal.     Gait: Gait normal.  Psychiatric:        Mood and Affect: Mood normal.        Behavior: Behavior normal.      No results found for any visits on 10/30/23.  The ASCVD Risk score (Arnett DK, et al., 2019) failed to calculate for the following reasons:   The patient has a prior MI or stroke diagnosis    Assessment & Plan:  Primary hypertension -     Lipid panel -     BMP8+eGFR -     CBC with Differential/Platelet  Diffuse pain -     ANA w/Reflex -     Rheumatoid factor  Type 2 diabetes mellitus without complication, without long-term current use of insulin (HCC) Assessment & Plan: Last Hemoglobin A1c: 6.2 Labs: Ordered today, results pending; will follow up accordingly. The patient reports  adhering to prescribed medications: Januvia 25 mg once daily, Glipizide 5 mg before breakfast   Reviewed non-pharmacological interventions, including a balanced diet rich in lean proteins, healthy fats, whole grains, and high-fiber vegetables. Emphasized reducing refined sugars and processed carbohydrates, and incorporating more fruits, leafy greens, and legumes. Education: Patient was educated on recognizing signs and symptoms of both hypoglycemia and hyperglycemia, and advised to seek emergency care if these symptoms occur. Follow-Up: Scheduled for follow-up in 3-4 months, or sooner if needed. Patient Understanding: The patient verbalized understanding of the care plan, and all questions were answered. Additional Care: Ophthalmology exam current. Foot exam results were within normal limits.    Orders: -     Hemoglobin A1c  COPD with acute exacerbation (HCC) -     Albuterol Sulfate; Take 3 mLs (2.5 mg total) by nebulization every 4 (four) hours as needed for wheezing or shortness of breath.  Dispense: 75 mL; Refill: 2  Essential hypertension Assessment & Plan: Vitals:   10/30/23 1506 10/30/23 1514 10/30/23 1545  BP: (!) 195/113 (!) 181/107 (!) 160/96  Patient reported not taking her blood pressure medication today due to forgetfulness. Advised the patient on the importance of medication adherence to reduce cardiovascular risks. The patient acknowledged and agreed to follow the recommendation. Plan: Follow up in one week with at-home blood pressure monitoring 2-3 hours after medication intake via my chart to monitor trends.  Continue Lisinopril-hydrochlorothiazide 20-12.5 and amlodipine 10 mg Labs ordered. Discussed with  patient to monitor their blood pressure regularly and maintain a heart-healthy diet rich in fruits, vegetables, whole grains, and low-fat dairy, while reducing sodium intake to less than 2,300 mg per day. Regular physical activity, such as 30 minutes of moderate exercise most days of the week, will help lower blood pressure and improve overall cardiovascular health. Avoiding smoking, limiting alcohol consumption, and managing stress. Take  prescribed medication, & take it as directed and avoid skipping doses. Seek emergency care if your blood pressure is (over 180/100) or you experience chest pain, shortness of breath, or sudden vision changes.Patient verbalizes understanding regarding plan of care and all questions answered.    Moderate episode of recurrent major depressive disorder [facility]) Assessment & Plan: Flowsheet Row Office Visit from 10/30/2023 in [facility]  PHQ-9 Total Score 11     Trial on Trazodone 50 mg at bedtime.  We discussed several non-pharmacological approaches to managing depression, including:  Establishing a  consistent daily routine: This helps create structure and stability. Practicing mindfulness and relaxation techniques: Incorporating meditation, deep breathing exercises, or yoga to manage stress and improve emotional well-being. Engaging in regular physical activity: Aim for at least 30 minutes of exercise most days to boost mood and energy levels. Spending time outdoors: Exposure to natural light and fresh air can improve mental health. Building a support network: Encouraging social connections with friends, family, or support groups to reduce feelings of isolation. Prioritizing a balanced diet: Eating nutrient-rich foods while avoiding excessive amounts of processed foods, sugar, and unhealthy fats.    Other orders -     traZODone HCl; Take 1 tablet (50 mg total) by mouth at bedtime as needed.  Dispense: 30 tablet; Refill: 3 -     amLODIPine Besylate; Take 1 tablet (10 mg total) by mouth daily.  Dispense: 90 tablet; Refill: 1 -     Albuterol Sulfate HFA; Inhale 2 puffs into the lungs every 4 (four) hours as needed for wheezing or shortness of breath.  Dispense: 18  g; Refill: 2 -     Vitamin D3; Take 1 capsule (2,000 Units total) by mouth daily.  Dispense: 90 capsule; Refill: 1 -     Clopidogrel Bisulfate; Take 1 tablet (75 mg total) by mouth daily.  Dispense: 30 tablet; Refill: 3 -     Gabapentin; Take 1 capsule (300 mg total) by mouth 3 (three) times daily as needed.  Dispense: 90 capsule; Refill: 3 -     glipiZIDE; Take 1 tablet (5 mg total) by mouth daily before breakfast.  Dispense: 60 tablet; Refill: 3 -     Lisinopril-hydroCHLOROthiazide; Take 1 tablet by mouth daily.  Dispense: 90 tablet; Refill: 3 -     SITagliptin Phosphate; Take 1 tablet (25 mg total) by mouth daily.  Dispense: 90 tablet; Refill: 1 -     Topiramate; Take 1 tablet (50 mg total) by mouth 2 (two) times daily.  Dispense: 60 tablet; Refill: 6 -     Rosuvastatin Calcium; Take 1 tablet (5 mg total) by mouth daily.   Dispense: 90 tablet; Refill: 3    Return in about 6 months (around 04/28/2024), or if symptoms worsen or fail to improve, for chronic follow-up.   Cruzita Lederer Newman Nip, FNP

## 2023-10-29 NOTE — Patient Instructions (Signed)

## 2023-10-30 ENCOUNTER — Encounter: Payer: Self-pay | Admitting: Family Medicine

## 2023-10-30 ENCOUNTER — Ambulatory Visit: Payer: Medicaid Other | Admitting: Family Medicine

## 2023-10-30 VITALS — BP 160/96 | HR 108 | Ht 61.0 in | Wt 157.1 lb

## 2023-10-30 DIAGNOSIS — E119 Type 2 diabetes mellitus without complications: Secondary | ICD-10-CM | POA: Diagnosis not present

## 2023-10-30 DIAGNOSIS — R52 Pain, unspecified: Secondary | ICD-10-CM

## 2023-10-30 DIAGNOSIS — J441 Chronic obstructive pulmonary disease with (acute) exacerbation: Secondary | ICD-10-CM

## 2023-10-30 DIAGNOSIS — F331 Major depressive disorder, recurrent, moderate: Secondary | ICD-10-CM

## 2023-10-30 DIAGNOSIS — I10 Essential (primary) hypertension: Secondary | ICD-10-CM | POA: Diagnosis not present

## 2023-10-30 MED ORDER — VITAMIN D3 50 MCG (2000 UT) PO CAPS: 2000.0000 [IU] | ORAL_CAPSULE | Freq: Every day | ORAL | 1 refills | Status: DC

## 2023-10-30 MED ORDER — TOPIRAMATE 50 MG PO TABS: 50.0000 mg | ORAL_TABLET | Freq: Two times a day (BID) | ORAL | 6 refills | Status: DC

## 2023-10-30 MED ORDER — ALBUTEROL SULFATE (2.5 MG/3ML) 0.083% IN NEBU: 2.5000 mg | INHALATION_SOLUTION | RESPIRATORY_TRACT | 2 refills | Status: DC | PRN

## 2023-10-30 MED ORDER — LISINOPRIL-HYDROCHLOROTHIAZIDE 20-12.5 MG PO TABS: 1.0000 | ORAL_TABLET | Freq: Every day | ORAL | 3 refills | Status: DC

## 2023-10-30 MED ORDER — TRAZODONE HCL 50 MG PO TABS: 50.0000 mg | ORAL_TABLET | Freq: Every evening | ORAL | 3 refills | Status: DC | PRN

## 2023-10-30 MED ORDER — GABAPENTIN 300 MG PO CAPS: 300.0000 mg | ORAL_CAPSULE | Freq: Three times a day (TID) | ORAL | 3 refills | Status: DC | PRN

## 2023-10-30 MED ORDER — ALBUTEROL SULFATE HFA 108 (90 BASE) MCG/ACT IN AERS: 2.0000 | INHALATION_SPRAY | RESPIRATORY_TRACT | 2 refills | Status: DC | PRN

## 2023-10-30 MED ORDER — AMLODIPINE BESYLATE 10 MG PO TABS: 10.0000 mg | ORAL_TABLET | Freq: Every day | ORAL | 1 refills | Status: DC

## 2023-10-30 MED ORDER — CLOPIDOGREL BISULFATE 75 MG PO TABS: 75.0000 mg | ORAL_TABLET | Freq: Every day | ORAL | 3 refills | Status: DC

## 2023-10-30 MED ORDER — ROSUVASTATIN CALCIUM 5 MG PO TABS: 5.0000 mg | ORAL_TABLET | Freq: Every day | ORAL | 3 refills | Status: DC

## 2023-10-30 MED ORDER — GLIPIZIDE 5 MG PO TABS: 5.0000 mg | ORAL_TABLET | Freq: Every day | ORAL | 3 refills | Status: DC

## 2023-10-30 MED ORDER — SITAGLIPTIN PHOSPHATE 25 MG PO TABS: 25.0000 mg | ORAL_TABLET | Freq: Every day | ORAL | 1 refills | Status: DC

## 2023-10-30 NOTE — Assessment & Plan Note (Signed)
Last Hemoglobin A1c: 6.2 Labs: Ordered today, results pending; will follow up accordingly. The patient reports adhering to prescribed medications: Januvia 25 mg once daily, Glipizide 5 mg before breakfast   Reviewed non-pharmacological interventions, including a balanced diet rich in lean proteins, healthy fats, whole grains, and high-fiber vegetables. Emphasized reducing refined sugars and processed carbohydrates, and incorporating more fruits, leafy greens, and legumes. Education: Patient was educated on recognizing signs and symptoms of both hypoglycemia and hyperglycemia, and advised to seek emergency care if these symptoms occur. Follow-Up: Scheduled for follow-up in 3-4 months, or sooner if needed. Patient Understanding: The patient verbalized understanding of the care plan, and all questions were answered. Additional Care: Ophthalmology exam current. Foot exam results were within normal limits.

## 2023-10-30 NOTE — Assessment & Plan Note (Signed)
Vitals:   10/30/23 1506 10/30/23 1514 10/30/23 1545  BP: (!) 195/113 (!) 181/107 (!) 160/96  Patient reported not taking her blood pressure medication today due to forgetfulness. Advised the patient on the importance of medication adherence to reduce cardiovascular risks. The patient acknowledged and agreed to follow the recommendation. Plan: Follow up in one week with at-home blood pressure monitoring 2-3 hours after medication intake via my chart to monitor trends.  Continue Lisinopril-hydrochlorothiazide 20-12.5 and amlodipine 10 mg Labs ordered. Discussed with  patient to monitor their blood pressure regularly and maintain a heart-healthy diet rich in fruits, vegetables, whole grains, and low-fat dairy, while reducing sodium intake to less than 2,300 mg per day. Regular physical activity, such as 30 minutes of moderate exercise most days of the week, will help lower blood pressure and improve overall cardiovascular health. Avoiding smoking, limiting alcohol consumption, and managing stress. Take  prescribed medication, & take it as directed and avoid skipping doses. Seek emergency care if your blood pressure is (over 180/100) or you experience chest pain, shortness of breath, or sudden vision changes.Patient verbalizes understanding regarding plan of care and all questions answered.

## 2023-10-30 NOTE — Assessment & Plan Note (Addendum)
Flowsheet Row Office Visit from 10/30/2023 in [facility]  PHQ-9 Total Score 11     Trial on Trazodone 50 mg at bedtime.  We discussed several non-pharmacological approaches to managing depression, including:  Establishing a consistent daily routine: This helps create structure and stability. Practicing mindfulness and relaxation techniques: Incorporating meditation, deep breathing exercises, or yoga to manage stress and improve emotional well-being. Engaging in regular physical activity: Aim for at least 30 minutes of exercise most days to boost mood and energy levels. Spending time outdoors: Exposure to natural light and fresh air can improve mental health. Building a support network: Encouraging social connections with friends, family, or support groups to reduce feelings of isolation. Prioritizing a balanced diet: Eating nutrient-rich foods while avoiding excessive amounts of processed foods, sugar, and unhealthy fats.

## 2023-11-13 ENCOUNTER — Encounter: Payer: Self-pay | Admitting: Family Medicine

## 2023-11-13 ENCOUNTER — Ambulatory Visit: Payer: Medicaid Other | Admitting: Internal Medicine

## 2023-11-13 ENCOUNTER — Encounter: Payer: Self-pay | Admitting: Internal Medicine

## 2023-11-13 VITALS — BP 136/84 | HR 77 | Ht 61.0 in | Wt 153.0 lb

## 2023-11-13 DIAGNOSIS — E785 Hyperlipidemia, unspecified: Secondary | ICD-10-CM | POA: Diagnosis not present

## 2023-11-13 DIAGNOSIS — E1159 Type 2 diabetes mellitus with other circulatory complications: Secondary | ICD-10-CM

## 2023-11-13 DIAGNOSIS — E1142 Type 2 diabetes mellitus with diabetic polyneuropathy: Secondary | ICD-10-CM

## 2023-11-13 DIAGNOSIS — Z7984 Long term (current) use of oral hypoglycemic drugs: Secondary | ICD-10-CM | POA: Diagnosis not present

## 2023-11-13 DIAGNOSIS — E559 Vitamin D deficiency, unspecified: Secondary | ICD-10-CM

## 2023-11-13 LAB — POCT GLUCOSE (DEVICE FOR HOME USE): POC Glucose: 150 mg/dL — AB (ref 70–99)

## 2023-11-13 LAB — POCT GLYCOSYLATED HEMOGLOBIN (HGB A1C): Hemoglobin A1C: 6.7 % — AB (ref 4.0–5.6)

## 2023-11-13 MED ORDER — GLIMEPIRIDE 2 MG PO TABS: 2.0000 mg | ORAL_TABLET | Freq: Every day | ORAL | 3 refills | Status: DC

## 2023-11-13 NOTE — Patient Instructions (Addendum)
STOP Glipizide  Start glimepiride 2 mg, 1 tablet before breakfast Increase Januvia 100 mg daily    HOW TO TREAT LOW BLOOD SUGARS (Blood sugar LESS THAN 70 MG/DL) Please follow the RULE OF 15 for the treatment of hypoglycemia treatment (when your (blood sugars are less than 70 mg/dL)   STEP 1: Take 15 grams of carbohydrates when your blood sugar is low, which includes:  3-4 GLUCOSE TABS  OR 3-4 OZ OF JUICE OR REGULAR SODA OR ONE TUBE OF GLUCOSE GEL    STEP 2: RECHECK blood sugar in 15 MINUTES STEP 3: If your blood sugar is still low at the 15 minute recheck --> then, go back to STEP 1 and treat AGAIN with another 15 grams of carbohydrates.

## 2023-11-13 NOTE — Progress Notes (Unsigned)
Name: Kayla Choi  MRN/ DOB: [id number], 02-09-1966   Age/ Sex: 57 y.o., female    PCP: Rica Records, FNP   Reason for Endocrinology Evaluation: Type 2 Diabetes Mellitus     Date of Initial Endocrinology Visit: 11/13/2023     PATIENT IDENTIFIER: Kayla Choi is a 57 y.o. female with a past medical history of DM, HTN, migraine headaches, COPD, Hx CVA. The patient presented for initial endocrinology clinic visit on 11/13/2023 for consultative assistance with her diabetes management.    HPI: Kayla Choi was    Diagnosed with DM 2013 Prior Medications tried/Intolerance: Metformin-anaphylaxis. Trulicity - abdominal cramps  Currently checking blood sugars occasionally   Hypoglycemia episodes : yes               Symptoms: yes                 Frequency: rare Hemoglobin A1c has ranged from 6.1% in 2017, peaking at 11.9% in 2021.  In terms of diet, the patient 3 meals a day, 2 snacks, drinks sugar free lemonade   Denies nausea or vomiting except last night, she had stomach cramps and vomiting due to an infection  Has occasional constipation but no diarrhea   She has left arm arm weakness and tingling after CVA  Has noted throbbing pain at the feet as well as tingling and numbness     HOME DIABETES REGIMEN: Glipizide 5 mg daily Januvia 25 mg daily    Statin: yes ACE-I/ARB: yes   METER DOWNLOAD SUMMARY: n/a   DIABETIC COMPLICATIONS: Microvascular complications:  Neuropathy  Denies: CKD,  Last eye exam: Completed 2023  Macrovascular complications:  CVA Denies: CAD, PVD   PAST HISTORY: Past Medical History:  Past Medical History:  Diagnosis Date   Acid reflux    ADD (attention deficit disorder)    Anemia    iron deficinecy   Anxiety    with social phobia   Asthma    Balance problem    Chest pain    that occurs with anxiety   COPD (chronic obstructive pulmonary disease) (HCC)    Depression    Diabetes mellitus    Glaucoma     Hyperlipidemia    Hypertension    Normal cardiac stress test 12/09/2015   low risk study   Panic attack    Prediabetes    Recurrent falls    Sleep apnea    Social phobia    Stroke [facility]) 11/2022   ministroke   Past Surgical History:  Past Surgical History:  Procedure Laterality Date   ABDOMINAL HYSTERECTOMY     fibroids, both ovaries left intact   ANKLE SURGERY     BIOPSY  10/14/2023   Procedure: BIOPSY;  Surgeon: Corbin Ade, MD;  [facility];  Service: Endoscopy;;   CHOLECYSTECTOMY     COLONOSCOPY N/A 10/18/2019   Procedure: COLONOSCOPY;  Surgeon: Corbin Ade, MD;  [facility];  Service: Endoscopy;  Laterality: N/A;  1:00-office rescheduled to 11/10 @ 12:45pm   COLONOSCOPY WITH PROPOFOL N/A 10/14/2023   Procedure: COLONOSCOPY WITH PROPOFOL;  Surgeon: Corbin Ade, MD;  Location: [facility];  Service: Endoscopy;  Laterality: N/A;  730am, asa 3   COSMETIC SURGERY     elbow   ESOPHAGOGASTRODUODENOSCOPY (EGD) WITH PROPOFOL N/A 10/14/2023   Procedure: ESOPHAGOGASTRODUODENOSCOPY (EGD) WITH PROPOFOL;  Surgeon: Corbin Ade, MD;  [facility];  Service: Endoscopy;  Laterality: N/A;   FRACTURE  SURGERY     Recurrent elbow surgery   LIPOMA EXCISION  04/30/2012   Procedure: EXCISION LIPOMA;  Surgeon: Fabio Bering, MD;  Location: [facility];  Service: General;  Laterality: Right;  Excision soft tissue mass right thigh   MALONEY DILATION N/A 10/14/2023   Procedure: Elease Hashimoto DILATION;  Surgeon: Corbin Ade, MD;  [facility];  Service: Endoscopy;  Laterality: N/A;   MASS EXCISION Right 12/19/2020   Procedure: EXCISION CYST; 2 CM; AXILLA;  Surgeon: Lucretia Roers, MD;  Location: [facility];  Service: General;  Laterality: Right;   ORIF ANKLE FRACTURE Right 12/15/2017   Procedure: OPEN REDUCTION INTERNAL FIXATION (ORIF) RIGHT ANKLE FRACTURE;  Surgeon: Sheral Apley, MD;  Location: [facility];  Service: Orthopedics;   Laterality: Right;   POLYPECTOMY  10/18/2019   Procedure: POLYPECTOMY;  Surgeon: Corbin Ade, MD;  [facility];  Service: Endoscopy;;  colon   POLYPECTOMY  10/14/2023   Procedure: POLYPECTOMY INTESTINAL;  Surgeon: Corbin Ade, MD;  [facility];  Service: Endoscopy;;   right elbow     reconstruction    Social History:  reports that she has never smoked. She has never used smokeless tobacco. She reports that she does not drink alcohol and does not use drugs. Family History:  Family History  Problem Relation Age of Onset   Hypertension Mother    Diabetes Father    Heart disease Father    Anesthesia problems Neg Hx    Malignant hyperthermia Neg Hx    Hypotension Neg Hx    Pseudochol deficiency Neg Hx      HOME MEDICATIONS: Allergies as of 11/13/2023       Reactions   Aspirin Shortness Of Breath, Itching   Metformin And Related Anaphylaxis   Other Anaphylaxis   Green peas   Oxycodone Itching   Diphenhydramine Hcl Rash   Latex Itching, Rash   Neosporin [neomycin-bacitracin Zn-polymyx] Swelling, Rash, Other (See Comments)   skin peeling        Medication List        Accurate as of November 13, 2023 11:32 AM. If you have any questions, ask your nurse or doctor.          albuterol 108 (90 Base) MCG/ACT inhaler Commonly known as: VENTOLIN HFA Inhale 2 puffs into the lungs every 4 (four) hours as needed for wheezing or shortness of breath.   albuterol (2.5 MG/3ML) 0.083% nebulizer solution Commonly known as: PROVENTIL Take 3 mLs (2.5 mg total) by nebulization every 4 (four) hours as needed for wheezing or shortness of breath.   amLODipine 10 MG tablet Commonly known as: NORVASC Take 1 tablet (10 mg total) by mouth daily.   Bismuth/Metronidaz/Tetracyclin 140-125-125 MG Caps Commonly known as: Pylera Take 3 capsules by mouth in the morning, at noon, in the evening, and at bedtime for 10 days.   Blood Glucose Monitoring Suppl Devi 1  each by Does not apply route in the morning, at noon, and at bedtime. May substitute to any manufacturer covered by patient's insurance.   clobetasol cream 0.05 % Commonly known as: TEMOVATE Apply 1 Application topically 2 (two) times daily.   clopidogrel 75 MG tablet Commonly known as: PLAVIX Take 1 tablet (75 mg total) by mouth daily.   gabapentin 300 MG capsule Commonly known as: NEURONTIN Take 1 capsule (300 mg total) by mouth 3 (three) times daily as needed.   GaviLyte-G 236 g solution Generic drug: polyethylene  glycol Take 4,000 mLs by mouth once.   glipiZIDE 5 MG tablet Commonly known as: GLUCOTROL Take 1 tablet (5 mg total) by mouth daily before breakfast.   lisinopril-hydrochlorothiazide 20-12.5 MG tablet Commonly known as: ZESTORETIC Take 1 tablet by mouth daily.   lubiprostone 24 MCG capsule Commonly known as: Amitiza Take 1 capsule (24 mcg total) by mouth 2 (two) times daily with a meal.   pantoprazole 40 MG tablet Commonly known as: PROTONIX Take 1 tablet (40 mg total) by mouth daily before breakfast.   rosuvastatin 5 MG tablet Commonly known as: Crestor Take 1 tablet (5 mg total) by mouth daily.   sitaGLIPtin 25 MG tablet Commonly known as: Januvia Take 1 tablet (25 mg total) by mouth daily.   topiramate 50 MG tablet Commonly known as: Topamax Take 1 tablet (50 mg total) by mouth 2 (two) times daily.   traZODone 50 MG tablet Commonly known as: DESYREL Take 1 tablet (50 mg total) by mouth at bedtime as needed.   Vitamin D3 50 MCG (2000 UT) capsule Take 1 capsule (2,000 Units total) by mouth daily.         ALLERGIES: Allergies  Allergen Reactions   Aspirin Shortness Of Breath and Itching   Metformin And Related Anaphylaxis   Other Anaphylaxis    Green peas   Oxycodone Itching   Diphenhydramine Hcl Rash   Latex Itching and Rash   Neosporin [Neomycin-Bacitracin Zn-Polymyx] Swelling, Rash and Other (See Comments)    skin peeling      REVIEW OF SYSTEMS: A comprehensive ROS was conducted with the patient and is negative except as per HPI     OBJECTIVE:   VITAL SIGNS: BP 136/84 (BP Location: Left Arm, Patient Position: Sitting, Cuff Size: Large)   Pulse 77   Ht 5\' 1"  (1.549 m)   Wt 153 lb (69.4 kg)   SpO2 99%   BMI 28.91 kg/m    PHYSICAL EXAM:  General: Pt appears well and is in NAD  Neck: General: Supple without adenopathy or carotid bruits. Thyroid: Thyroid size normal.  No goiter or nodules appreciated.   Lungs: Clear with good BS bilat   Heart: RRR   Extremities:  Lower extremities - No pretibial edema.   Neuro: MS is good with appropriate affect, pt is alert and Ox3    DM foot exam: 11/13/2023  The skin of the feet is intact without sores or ulcerations. The pedal pulses are 2+ on right and 2+ on left. The sensation is decreased to a screening 5.07, 10 gram monofilament bilaterally   DATA REVIEWED:  Lab Results  Component Value Date   HGBA1C 6.2 (H) 07/31/2023   HGBA1C 7.9 (H) 04/10/2023   HGBA1C 9.0 (H) 02/09/2023    Latest Reference Range & Units 07/31/23 09:38  Sodium 134 - 144 mmol/L 141  Potassium 3.5 - 5.2 mmol/L 4.1  Chloride 96 - 106 mmol/L 106  CO2 20 - 29 mmol/L 24  Glucose 70 - 99 mg/dL 161 (H)  BUN 6 - 24 mg/dL 11  Creatinine 0.96 - 0.45 mg/dL 4.09 (H)  Calcium 8.7 - 10.2 mg/dL 9.8  BUN/Creatinine Ratio 9 - 23  10  eGFR >59 mL/min/1.73 62  Alkaline Phosphatase 44 - 121 IU/L 40 (L)  Albumin 3.8 - 4.9 g/dL 4.3  AST 0 - 40 IU/L 19  ALT 0 - 32 IU/L 23  Total Protein 6.0 - 8.5 g/dL 7.0  Total Bilirubin 0.0 - 1.2 mg/dL 0.7    Latest Reference  Range & Units 07/31/23 09:38  Total CHOL/HDL Ratio 0.0 - 4.4 ratio 3.3  Cholesterol, Total 100 - 199 mg/dL 034  HDL Cholesterol >74 mg/dL 57  Triglycerides 0 - 259 mg/dL 82  VLDL Cholesterol Cal 5 - 40 mg/dL 15  LDL Chol Calc (NIH) 0 - 99 mg/dL 563 (H)  (H): Data is abnormally high    ASSESSMENT / PLAN / RECOMMENDATIONS:    1) Type 2 Diabetes Mellitus, Optimally controlled, With macrovascular complications - Most recent A1c of 6.7 %. Goal A1c < 7.0 %.    Plan: GENERAL: I have discussed with the patient the pathophysiology of diabetes. We went over the natural progression of the disease. We stressed the importance of lifestyle changes. I explained the complications associated with diabetes including retinopathy, nephropathy, neuropathy as well as increased risk of cardiovascular disease. We went over the benefit seen with glycemic control.   I explained to the patient that diabetic patients are at higher than normal risk for amputations.  Patient endorses hypoglycemia with glipizide.  I have recommended changing it to glimepiride as below I will also increase Januvia Not a candidate for GLP-1 agonist at this time due to GI issues A referral to podiatry has been placed, patient interested in diabetic shoes  MEDICATIONS: Stop glipizide Start glimepiride 2 mg daily Increase Januvia 100 mg daily  EDUCATION / INSTRUCTIONS: BG monitoring instructions: Patient is instructed to check her blood sugars 1 times a day. Call  Endocrinology clinic if: BG persistently < 70  I reviewed the Rule of 15 for the treatment of hypoglycemia in detail with the patient. Literature supplied.   2) Diabetic complications:  Eye: Does not have known diabetic retinopathy.  Neuro/ Feet: Does  have known diabetic peripheral neuropathy. Renal: Patient does not have known baseline CKD. She is  on an ACEI/ARB at present.  3) Dyslipidemia :   -Discussed cardiovascular benefits of statin therapy   Medication Rosuvastatin 5 mg daily    Signed electronically by: Lyndle Herrlich, MD  [facility] Endocrinology  [facility] [address] [place] [place], [place] [address] Phone: [phone] FAX: [phone]   CC: Rica Records, FNP [phone] [address] [place] [address] Phone:  [phone]  Fax: [phone]    Return to Endocrinology clinic as below: Future Appointments  Date Time Provider Department Center  11/20/2023  2:00 PM Reuel Boom [facility]  12/17/2023 10:30 AM Dohmeier, Porfirio Mylar, MD [facility] None  01/11/2024  2:30 PM Micki Riley, MD [facility] None  01/25/2024 10:00 AM Tiffany Kocher, PA-C [facility]  04/29/2024 10:00 AM Del Nigel Berthold, FNP [facility]

## 2023-11-13 NOTE — Telephone Encounter (Signed)
 Care team updated and letter sent for eye exam notes.

## 2023-11-14 LAB — COMPREHENSIVE METABOLIC PANEL
AG Ratio: 1.4 (calc) (ref 1.0–2.5)
ALT: 19 U/L (ref 6–29)
AST: 19 U/L (ref 10–35)
Albumin: 4.3 g/dL (ref 3.6–5.1)
Alkaline phosphatase (APISO): 44 U/L (ref 37–153)
BUN: 11 mg/dL (ref 7–25)
CO2: 30 mmol/L (ref 20–32)
Calcium: 9.8 mg/dL (ref 8.6–10.4)
Chloride: 101 mmol/L (ref 98–110)
Creat: 0.87 mg/dL (ref 0.50–1.03)
Globulin: 3.1 g/dL (ref 1.9–3.7)
Glucose, Bld: 155 mg/dL — ABNORMAL HIGH (ref 65–99)
Potassium: 4.1 mmol/L (ref 3.5–5.3)
Sodium: 139 mmol/L (ref 135–146)
Total Bilirubin: 1.2 mg/dL (ref 0.2–1.2)
Total Protein: 7.4 g/dL (ref 6.1–8.1)

## 2023-11-14 LAB — LIPID PANEL
Cholesterol: 253 mg/dL — ABNORMAL HIGH (ref <200)
HDL: 67 mg/dL (ref >=50)
LDL Cholesterol (Calc): 169 mg/dL — ABNORMAL HIGH
Non-HDL Cholesterol (Calc): 186 mg/dL — ABNORMAL HIGH (ref <130)
Total CHOL/HDL Ratio: 3.8 (calc) (ref <5.0)
Triglycerides: 73 mg/dL (ref <150)

## 2023-11-14 LAB — VITAMIN D 25 HYDROXY (VIT D DEFICIENCY, FRACTURES): Vit D, 25-Hydroxy: 16 ng/mL — ABNORMAL LOW (ref 30–100)

## 2023-11-14 LAB — TSH: TSH: 1.05 m[IU]/L (ref 0.40–4.50)

## 2023-11-14 LAB — MICROALBUMIN / CREATININE URINE RATIO
Creatinine, Urine: 144 mg/dL (ref 20–275)
Microalb Creat Ratio: 5 mg/g{creat} (ref <30)
Microalb, Ur: 0.7 mg/dL

## 2023-11-14 LAB — T4, FREE: Free T4: 1.2 ng/dL (ref 0.8–1.8)

## 2023-11-16 MED ORDER — VITAMIN D (ERGOCALCIFEROL) 1.25 MG (50000 UNIT) PO CAPS: 50000.0000 [IU] | ORAL_CAPSULE | ORAL | 2 refills | Status: DC

## 2023-11-16 MED ORDER — ROSUVASTATIN CALCIUM 10 MG PO TABS: 10.0000 mg | ORAL_TABLET | Freq: Every day | ORAL | 3 refills | Status: DC

## 2023-11-20 ENCOUNTER — Ambulatory Visit: Payer: Medicaid Other | Admitting: Professional Counselor

## 2023-11-20 DIAGNOSIS — F331 Major depressive disorder, recurrent, moderate: Secondary | ICD-10-CM
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
## 2023-11-20 NOTE — Patient Instructions (Signed)
If your symptoms worsen or you have thoughts of suicide/homicide, PLEASE SEEK IMMEDIATE MEDICAL ATTENTION.  You may always call:   National Suicide Hotline: 988 or 800-273-8255 [place] Crisis Line: 336-832-9700 Crisis Recovery in [place]: 800-939-5911      These are available 24 hours a day, 7 days a week.  

## 2023-12-10 ENCOUNTER — Ambulatory Visit: Payer: Medicaid Other | Admitting: Podiatry

## 2023-12-10 ENCOUNTER — Telehealth: Payer: Self-pay | Admitting: Neurology

## 2023-12-10 ENCOUNTER — Encounter: Payer: Self-pay | Admitting: Podiatry

## 2023-12-10 DIAGNOSIS — G8929 Other chronic pain: Secondary | ICD-10-CM

## 2023-12-10 DIAGNOSIS — E1142 Type 2 diabetes mellitus with diabetic polyneuropathy: Secondary | ICD-10-CM | POA: Diagnosis not present

## 2023-12-10 DIAGNOSIS — M25571 Pain in right ankle and joints of right foot: Secondary | ICD-10-CM | POA: Diagnosis not present

## 2023-12-10 MED ORDER — PREGABALIN 25 MG PO CAPS: 25.0000 mg | ORAL_CAPSULE | Freq: Two times a day (BID) | ORAL | 0 refills | Status: AC

## 2023-12-10 NOTE — Progress Notes (Signed)
 Chief Complaint  Patient presents with   Diabetes    The Diabetic Specialist sent me over here. N - Diabetic foot check L - bilateral D - 1 year O - suddenly, after I had my Stroke C - tingly, numb A - none T - Gabapentin     HPI: 58 y.o. female presents today for diabetic foot evaluation.  Primary complaint of diabetic neuropathy.  She is currently taking gabapentin .  States that she still gets numbness, tingling, burning sensations.  Her last A1c was in the sixes.  She also endorses history of stroke with left-sided weakness.  She also complains of ankle pain with history of fracture several years ago.  Past Medical History:  Diagnosis Date   Acid reflux    ADD (attention deficit disorder)    Anemia    iron deficinecy   Anxiety    with social phobia   Asthma    Balance problem    Chest pain    that occurs with anxiety   COPD (chronic obstructive pulmonary disease) (HCC)    Depression    Diabetes mellitus    Glaucoma    Hyperlipidemia    Hypertension    Normal cardiac stress test 12/09/2015   low risk study   Panic attack    Prediabetes    Recurrent falls    Sleep apnea    Social phobia    Stroke [facility]) 11/2022   ministroke    Past Surgical History:  Procedure Laterality Date   ABDOMINAL HYSTERECTOMY     fibroids, both ovaries left intact   ANKLE SURGERY     BIOPSY  10/14/2023   Procedure: BIOPSY;  Surgeon: Shaaron Lamar HERO, MD;  [facility];  Service: Endoscopy;;   CHOLECYSTECTOMY     COLONOSCOPY N/A 10/18/2019   Procedure: COLONOSCOPY;  Surgeon: Shaaron Lamar HERO, MD;  [facility];  Service: Endoscopy;  Laterality: N/A;  1:00-office rescheduled to 11/10 @ 12:45pm   COLONOSCOPY WITH PROPOFOL  N/A 10/14/2023   Procedure: COLONOSCOPY WITH PROPOFOL ;  Surgeon: Shaaron Lamar HERO, MD;  [facility];  Service: Endoscopy;  Laterality: N/A;  730am, asa 3   COSMETIC SURGERY     elbow   ESOPHAGOGASTRODUODENOSCOPY (EGD) WITH  PROPOFOL  N/A 10/14/2023   Procedure: ESOPHAGOGASTRODUODENOSCOPY (EGD) WITH PROPOFOL ;  Surgeon: Shaaron Lamar HERO, MD;  [facility];  Service: Endoscopy;  Laterality: N/A;   FRACTURE SURGERY     Recurrent elbow surgery   LIPOMA EXCISION  04/30/2012   Procedure: EXCISION LIPOMA;  Surgeon: Thresa JAYSON Pulling, MD;  Location: [facility];  Service: General;  Laterality: Right;  Excision soft tissue mass right thigh   MALONEY DILATION N/A 10/14/2023   Procedure: AGAPITO DILATION;  Surgeon: Shaaron Lamar HERO, MD;  [facility];  Service: Endoscopy;  Laterality: N/A;   MASS EXCISION Right 12/19/2020   Procedure: EXCISION CYST; 2 CM; AXILLA;  Surgeon: Kallie Manuelita JAYSON, MD;  Location: [facility];  Service: General;  Laterality: Right;   ORIF ANKLE FRACTURE Right 12/15/2017   Procedure: OPEN REDUCTION INTERNAL FIXATION (ORIF) RIGHT ANKLE FRACTURE;  Surgeon: Beverley Evalene BIRCH, MD;  Location: [facility];  Service: Orthopedics;  Laterality: Right;   POLYPECTOMY  10/18/2019   Procedure: POLYPECTOMY;  Surgeon: Shaaron Lamar HERO, MD;  [facility];  Service: Endoscopy;;  colon   POLYPECTOMY  10/14/2023   Procedure: POLYPECTOMY INTESTINAL;  Surgeon: Shaaron Lamar HERO, MD;  [facility];  Service: Endoscopy;;   right elbow     reconstruction    Allergies  Allergen Reactions   Aspirin Shortness Of Breath and Itching   Metformin  And Related Anaphylaxis   Other Anaphylaxis    Green peas   Oxycodone  Itching   Diphenhydramine  Hcl Rash   Latex Itching and Rash   Neosporin [Neomycin-Bacitracin  Zn-Polymyx] Swelling, Rash and Other (See Comments)    skin peeling    ROS denies any nausea, vomiting, fever, chills, chest pain, shortness of breath   Physical Exam: There were no vitals filed for this visit.  General: The patient is alert and oriented x3 in no acute distress.  Dermatology: Skin is warm, dry and supple bilateral lower extremities. Interspaces are clear of  maceration and debris.  Nails within normal limits for thickness and length today.  Vascular: Palpable pedal pulses bilaterally. Capillary refill within normal limits.  No appreciable edema.  No erythema or calor.  Pedal hair growth absent  Neurological: Vibratory sensation nearly absent.  Protective sensation decreased bilaterally present at 3/10 sites left side, 2/10 sites right side.  Musculoskeletal Exam: No symptomatic pedal deformities.  Right lower extremity muscle strength 5/5 for all major muscle groups.  Left lower extremity muscle strength 4/5 in dorsiflexion plantarflexion, 5/5 inversion and eversion.  Some tenderness on palpation of right anterior ankle.  Radiographic Exam:  Deferred  Assessment/Plan of Care: 1. Diabetic polyneuropathy associated with type 2 diabetes mellitus (HCC)   2. Chronic pain of right ankle      Meds ordered this encounter  Medications   pregabalin  (LYRICA ) 25 MG capsule    Sig: Take 1 capsule (25 mg total) by mouth 2 (two) times daily.    Dispense:  60 capsule    Refill:  0   None  Discussed clinical findings with patient today.  # Diabetic neuropathy -Patient educated on diabetes. Discussed proper diabetic foot care and discussed risks and complications of disease. Educated patient in depth on reasons to return to the office immediately should he/she discover anything concerning or new on the feet. All questions answered. Discussed proper shoes as well.   -Patient feels like she has not had satisfactory relief with gabapentin , we will start trial of Lyrica . - Did discuss other treatments such as topical therapy, she may be interested in this going forward -Follow-up in 1 month to assess any change in her symptoms  # Right ankle pain - Primary complaint of neuropathy today, will try compressive ankle sleeve today. - Did discuss over-the-counter ASO ankle brace - If no improvement, will workup further at follow-up appointment   Ferrell Flam L.  Lamount MAUL, AACFAS [facility]     [address] [place], [place] [address]                Office [phone]  Fax [phone]

## 2023-12-10 NOTE — Telephone Encounter (Signed)
 LVM and sent mychart msg informing pt of need to reschedule 01/11/24 appt- MD out

## 2023-12-17 ENCOUNTER — Ambulatory Visit: Payer: Medicaid Other | Admitting: Neurology

## 2023-12-21 ENCOUNTER — Telehealth (INDEPENDENT_AMBULATORY_CARE_PROVIDER_SITE_OTHER): Payer: Medicaid Other | Admitting: Professional Counselor

## 2023-12-21 DIAGNOSIS — F331 Major depressive disorder, recurrent, moderate: Secondary | ICD-10-CM
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
## 2024-01-11 ENCOUNTER — Ambulatory Visit: Payer: Medicaid Other | Admitting: Neurology

## 2024-01-12 NOTE — Progress Notes (Deleted)
 Marland Kitchen

## 2024-01-13 ENCOUNTER — Telehealth: Payer: Self-pay | Admitting: Neurology

## 2024-01-13 ENCOUNTER — Ambulatory Visit: Payer: Medicaid Other | Admitting: Neurology

## 2024-01-13 NOTE — Telephone Encounter (Signed)
Per [facility]

## 2024-01-13 NOTE — Telephone Encounter (Signed)
 Pt has not been compliant with using the machine anyways. Will reach out to her DME company so they can discuss her either turning the machine or restarting the process.

## 2024-01-13 NOTE — Telephone Encounter (Signed)
 Pt called to cx appt due to not having transportation. Pt was informed that her insurance compliance deadline is the 8th but pt stated that she had no way of getting to the office. Please advise.

## 2024-01-15 ENCOUNTER — Other Ambulatory Visit: Payer: Self-pay | Admitting: Family Medicine

## 2024-01-15 ENCOUNTER — Ambulatory Visit: Payer: Medicaid Other | Admitting: Neurology

## 2024-01-15 ENCOUNTER — Encounter: Payer: Self-pay | Admitting: Family Medicine

## 2024-01-15 DIAGNOSIS — Z1231 Encounter for screening mammogram for malignant neoplasm of breast: Secondary | ICD-10-CM

## 2024-01-25 ENCOUNTER — Ambulatory Visit (INDEPENDENT_AMBULATORY_CARE_PROVIDER_SITE_OTHER): Payer: Medicaid Other | Admitting: Gastroenterology

## 2024-01-25 ENCOUNTER — Encounter: Payer: Self-pay | Admitting: Gastroenterology

## 2024-01-25 ENCOUNTER — Encounter: Payer: Self-pay | Admitting: *Deleted

## 2024-01-25 VITALS — BP 159/96 | HR 71 | Temp 98.7°F | Ht 61.0 in | Wt 159.2 lb

## 2024-01-25 DIAGNOSIS — R490 Dysphonia: Secondary | ICD-10-CM | POA: Diagnosis not present

## 2024-01-25 DIAGNOSIS — K219 Gastro-esophageal reflux disease without esophagitis: Secondary | ICD-10-CM | POA: Diagnosis not present

## 2024-01-25 DIAGNOSIS — J029 Acute pharyngitis, unspecified: Secondary | ICD-10-CM | POA: Insufficient documentation

## 2024-01-25 DIAGNOSIS — B9681 Helicobacter pylori [H. pylori] as the cause of diseases classified elsewhere: Secondary | ICD-10-CM

## 2024-01-25 DIAGNOSIS — R1310 Dysphagia, unspecified: Secondary | ICD-10-CM

## 2024-01-25 DIAGNOSIS — R1319 Other dysphagia: Secondary | ICD-10-CM

## 2024-01-25 DIAGNOSIS — A048 Other specified bacterial intestinal infections: Secondary | ICD-10-CM | POA: Insufficient documentation

## 2024-01-25 DIAGNOSIS — K2970 Gastritis, unspecified, without bleeding: Secondary | ICD-10-CM

## 2024-01-25 DIAGNOSIS — K5900 Constipation, unspecified: Secondary | ICD-10-CM

## 2024-01-25 MED ORDER — LINACLOTIDE 145 MCG PO CAPS: 145.0000 ug | ORAL_CAPSULE | Freq: Every day | ORAL | 5 refills | Status: DC

## 2024-01-25 MED ORDER — PANTOPRAZOLE SODIUM 40 MG PO TBEC: 40.0000 mg | DELAYED_RELEASE_TABLET | Freq: Two times a day (BID) | ORAL | 5 refills | Status: DC

## 2024-01-25 NOTE — Patient Instructions (Addendum)
 You will need to collect stool specimen to make sure that we adequately treated the bacteria in your stomach.  In order to accurately do this, we need you to STOP pantoprazole for 2 weeks.  You also cannot be on antibiotics for 2 weeks before stool collection.  Once you collect your stool sample, you may restart pantoprazole and increase to 40 mg once before breakfast and once before your evening meal in order to try to treat hoarseness and sore throat which may be due to acid reflux. I sent in new prescription to your pharmacy. While off pantoprazole for 2 weeks, if you need something for acid reflux you can take famotidine 20 mg twice daily.  This can be purchased over-the-counter. Stop lubiprostone (Amitiza).  Start Linzess 145 mcg daily for constipation, prescription sent to your pharmacy.  Let me know if this does not work well for you or is not covered.    We are going to arrange xray of your esophagus to further evaluate your swallowing concerns.  Return office visit in four weeks. If your sore throat and swallowing is not improved, we will refer you to ENT.

## 2024-01-25 NOTE — Progress Notes (Unsigned)
 GI Office Note    Referring Provider: Wylene Men* Primary Care Physician:  Rica Records, FNP  Primary Gastroenterologist: Roetta Sessions, MD   Chief Complaint   Chief Complaint  Patient presents with   Follow-up    History of Present Illness   Kayla Choi is a 58 y.o. female presenting today for follow up. Last seen 07/2023. History of poor appetite and weight loss since her stroke 11/2022. H/o adenomatous colon polyps, constipation, pill dysphagia, epigastric pain.    Today: BM every other day. Stools hard sometimes. Amitiza not really helping. No melena, brbpr. Appetite little better. Some heartburn, occasional. Baking soda water needed at time. Hard to swallow. Hoarse first thing in the morning and evening. Feels like has been hoarse since having esophagus stretched 10/2023. Woke up morning after EGD/ED with sore throat. Still has soreness but getting better. When takes medication feels like it sticks in throat and has to drink two bottles of water to make it go down. Complains of some intermittent abdominal cramping until goes to restroom. No melena, brbpr. States she completed H.pylori treatment as prescribed.    Abd U/S 05/2023: IMPRESSION: 1. Diffuse increased echogenicity throughout the liver is nonspecific and may be seen with hepatic steatosis or other underlying intrinsic liver disease. 2. The gallbladder is surgically absent. 3. No other abnormalities.  EGD 10/2023: -slight nodularity and erosion of the distal esophagus s/p maloney dilation and bx, reactive squamous mucos, scant cardia type gastric mucosa showing minimal chronic gastritis -small hiatal hernia. Abnormal gastric mucosa of uncertain significance s/p bx with marked chronic active gastritis, h.pylori present -normal duodenal bulb and second portion of the duodenum  Colonoscopy 10/2023: -two 3-47mm polyps in the descending colon and ascending colon removed, tubular  adenoma -next colonoscopy 7 years  Wt Readings from Last 20 Encounters:  01/25/24 159 lb 3.2 oz (72.2 kg)  11/13/23 153 lb (69.4 kg)  10/30/23 157 lb 1.3 oz (71.3 kg)  10/14/23 162 lb 14.7 oz (73.9 kg)  10/12/23 163 lb (73.9 kg)  07/30/23 159 lb (72.1 kg)  07/27/23 159 lb (72.1 kg)  07/15/23 162 lb (73.5 kg)  06/04/23 166 lb (75.3 kg)  05/08/23 169 lb (76.7 kg)  05/06/23 167 lb (75.8 kg)  05/06/23 167 lb (75.8 kg)  05/06/23 167 lb (75.8 kg)  04/16/23 166 lb (75.3 kg)  04/09/23 166 lb (75.3 kg)  12/17/22 189 lb (85.7 kg)  12/02/22 177 lb 0.5 oz (80.3 kg)  01/10/21 188 lb (85.3 kg)  12/26/20 187 lb (84.8 kg)  12/19/20 189 lb (85.7 kg)     Medications   Current Outpatient Medications  Medication Sig Dispense Refill   albuterol (PROVENTIL) (2.5 MG/3ML) 0.083% nebulizer solution Take 3 mLs (2.5 mg total) by nebulization every 4 (four) hours as needed for wheezing or shortness of breath. 75 mL 2   albuterol (VENTOLIN HFA) 108 (90 Base) MCG/ACT inhaler Inhale 2 puffs into the lungs every 4 (four) hours as needed for wheezing or shortness of breath. 18 g 2   amLODipine (NORVASC) 10 MG tablet Take 1 tablet (10 mg total) by mouth daily. 90 tablet 1   Blood Glucose Monitoring Suppl DEVI 1 each by Does not apply route in the morning, at noon, and at bedtime. May substitute to any manufacturer covered by patient's insurance. 1 each 0   Cholecalciferol (VITAMIN D3) 50 MCG (2000 UT) capsule Take 1 capsule (2,000 Units total) by mouth daily. 90 capsule 1  clobetasol cream (TEMOVATE) 0.05 % Apply 1 Application topically 2 (two) times daily. 30 g 0   clopidogrel (PLAVIX) 75 MG tablet Take 1 tablet (75 mg total) by mouth daily. 30 tablet 3   gabapentin (NEURONTIN) 300 MG capsule Take 1 capsule (300 mg total) by mouth 3 (three) times daily as needed. 90 capsule 3   glimepiride (AMARYL) 2 MG tablet Take 1 tablet (2 mg total) by mouth daily before breakfast. 90 tablet 3    lisinopril-hydrochlorothiazide (ZESTORETIC) 20-12.5 MG tablet Take 1 tablet by mouth daily. 90 tablet 3   lubiprostone (AMITIZA) 24 MCG capsule Take 1 capsule (24 mcg total) by mouth 2 (two) times daily with a meal. 60 capsule 11   pantoprazole (PROTONIX) 40 MG tablet Take 1 tablet (40 mg total) by mouth daily before breakfast. 30 tablet 15   pregabalin (LYRICA) 25 MG capsule Take 1 capsule (25 mg total) by mouth 2 (two) times daily. 60 capsule 0   rosuvastatin (CRESTOR) 10 MG tablet Take 1 tablet (10 mg total) by mouth daily. 90 tablet 3   sitaGLIPtin (JANUVIA) 25 MG tablet Take 1 tablet (25 mg total) by mouth daily. 90 tablet 1   topiramate (TOPAMAX) 50 MG tablet Take 1 tablet (50 mg total) by mouth 2 (two) times daily. 60 tablet 6   traZODone (DESYREL) 50 MG tablet Take 1 tablet (50 mg total) by mouth at bedtime as needed. 30 tablet 3   No current facility-administered medications for this visit.    Allergies   Allergies as of 01/25/2024 - Review Complete 01/25/2024  Allergen Reaction Noted   Aspirin Shortness Of Breath and Itching 09/03/2011   Metformin and related Anaphylaxis 04/02/2012   Other Anaphylaxis 04/15/2012   Oxycodone Itching 06/25/2014   Diphenhydramine hcl Rash 03/28/2014   Latex Itching and Rash 01/29/2007   Neosporin [neomycin-bacitracin zn-polymyx] Swelling, Rash, and Other (See Comments) 04/15/2012     Review of Systems   General: Negative for anorexia, weight loss, fever, chills, fatigue, weakness. ENT: Negative for  nasal congestion. See hpi CV: Negative for chest pain, angina, palpitations, dyspnea on exertion, peripheral edema.  Respiratory: Negative for dyspnea at rest, dyspnea on exertion, cough, sputum, wheezing.  GI: See history of present illness. GU:  Negative for dysuria, hematuria, urinary incontinence, urinary frequency, nocturnal urination.  Endo: Negative for unusual weight change.     Physical Exam   BP (!) 159/96 (BP Location: Right Arm,  Patient Position: Sitting, Cuff Size: Large)   Pulse 71   Temp 98.7 F (37.1 C) (Oral)   Ht 5\' 1"  (1.549 m)   Wt 159 lb 3.2 oz (72.2 kg)   SpO2 96%   BMI 30.08 kg/m    General: Well-nourished, well-developed in no acute distress.  Eyes: No icterus. Mouth: Oropharyngeal mucosa moist and pink   Abdomen: Bowel sounds are normal, nontender, nondistended, no hepatosplenomegaly or masses,  no abdominal bruits or hernia , no rebound or guarding.  Rectal: not performed  Extremities: No lower extremity edema. No clubbing or deformities. Neuro: Alert and oriented x 4   Skin: Warm and dry, no jaundice.   Psych: Alert and cooperative, normal mood and affect.  Labs   Lab Results  Component Value Date   TSH 1.05 11/13/2023   Lab Results  Component Value Date   HGBA1C 6.7 (A) 11/13/2023   Lab Results  Component Value Date   NA 139 11/13/2023   CL 101 11/13/2023   K 4.1 11/13/2023   CO2 30  11/13/2023   BUN 11 11/13/2023   CREATININE 0.87 11/13/2023   EGFR 62 07/31/2023   CALCIUM 9.8 11/13/2023   ALBUMIN 4.3 07/31/2023   GLUCOSE 155 (H) 11/13/2023   Lab Results  Component Value Date   WBC 6.8 05/06/2023   HGB 12.3 05/06/2023   HCT 38.3 05/06/2023   MCV 95.3 05/06/2023   PLT 370 05/06/2023   Lab Results  Component Value Date   ALT 19 11/13/2023   AST 19 11/13/2023   ALKPHOS 40 (L) 07/31/2023   BILITOT 1.2 11/13/2023    Imaging Studies   No results found.  Assessment/Plan:   GERD: -typical symptoms well controlled, occasional breakthrough heartburn -reinforced anti-reflux measures  Dysphagia -continues with pill dysphagia -BPE  Sore throat/hoarseness: -notes since her EGD/ED -?LPR -increase pantoprazole to BID, if no improvement she may require ENT evaluation -OV in 4 weeks  H.yplori gastritis: -H.pylori stool antigen to confirm eradication. She is aware of need to be off PPI, antibiotics, Pepto for 2 weeks before stool collection  Constipation:  -stop  amitiza -start Linzess daily  Weight loss: -overall has stabilized, continue to monitor  Fatty liver: -noted on u/s 05/2023 -will discuss further at next [facility] S. Melvyn Neth, MHS, PA-C [facility]

## 2024-01-29 ENCOUNTER — Ambulatory Visit (HOSPITAL_COMMUNITY)
Admission: RE | Admit: 2024-01-29 | Discharge: 2024-01-29 | Disposition: A | Discharge status: Home / Self Care | Payer: Medicaid Other | Source: Ambulatory Visit | Attending: Gastroenterology | Admitting: Gastroenterology

## 2024-01-29 DIAGNOSIS — R490 Dysphonia: Secondary | ICD-10-CM | POA: Diagnosis present

## 2024-01-29 DIAGNOSIS — A048 Other specified bacterial intestinal infections: Secondary | ICD-10-CM | POA: Diagnosis present

## 2024-01-29 DIAGNOSIS — K5900 Constipation, unspecified: Secondary | ICD-10-CM | POA: Diagnosis present

## 2024-01-29 DIAGNOSIS — R1319 Other dysphagia: Secondary | ICD-10-CM | POA: Insufficient documentation

## 2024-01-29 DIAGNOSIS — J029 Acute pharyngitis, unspecified: Secondary | ICD-10-CM | POA: Insufficient documentation

## 2024-01-30 ENCOUNTER — Other Ambulatory Visit: Payer: Self-pay | Admitting: Family Medicine

## 2024-01-30 DIAGNOSIS — E119 Type 2 diabetes mellitus without complications: Secondary | ICD-10-CM

## 2024-02-01 MED ORDER — BLOOD GLUCOSE MONITORING SUPPL DEVI: 1.0000 | Freq: Three times a day (TID) | 0 refills | Status: AC

## 2024-02-11 ENCOUNTER — Encounter (HOSPITAL_COMMUNITY): Payer: Self-pay

## 2024-02-11 ENCOUNTER — Ambulatory Visit (HOSPITAL_COMMUNITY)
Admission: RE | Admit: 2024-02-11 | Discharge: 2024-02-11 | Disposition: A | Discharge status: Home / Self Care | Payer: Medicaid Other | Source: Ambulatory Visit | Attending: Family Medicine | Admitting: Family Medicine

## 2024-02-11 DIAGNOSIS — Z1231 Encounter for screening mammogram for malignant neoplasm of breast: Secondary | ICD-10-CM | POA: Insufficient documentation

## 2024-02-11 NOTE — Progress Notes (Signed)
 Name: Kayla Choi  MRN/ DOB: [id number], [birth year]   Age/ Sex: 58 y.o., female    PCP: Rica Records, FNP   Reason for Endocrinology Evaluation: Type 2 Diabetes Mellitus     Date of Initial Endocrinology Visit: 11/13/2023    PATIENT IDENTIFIER: Kayla Choi is a 58 y.o. female with a past medical history of DM, HTN, migraine headaches, COPD, Hx CVA. The patient presented for initial endocrinology clinic visit on 11/13/2023 for consultative assistance with her diabetes management.    HPI: Kayla Choi was    Diagnosed with DM 2013 Prior Medications tried/Intolerance: Metformin-anaphylaxis. Trulicity - abdominal cramps                 Hemoglobin A1c has ranged from 6.1% in 2017, peaking at 11.9% in 2021.   She has left arm arm weakness and tingling after CVA   On her initial visit to our clinic she had an A1c of 6.7%, she was on glipizide and Januvia.  I increase Januvia and switch glipizide to glimepiride  Due to elevated LDL, I increased rosuvastatin  SUBJECTIVE:   During the last visit (11/13/2023): A1c 6.7%  Today (02/12/24): Kayla Choi is here for follow-up on diabetes management.  She checks blood sugars occasionally times daily. The patient has not had hypoglycemic episodes since the last clinic visit.  Has occasional abdominal cramps but no nausea or vomiting  Has occasional constipation   HOME DIABETES REGIMEN: Glimepiride 2 mg daily Januvia 100 mg daily Rosuvastatin 10 mg daily Ergocalciferol 50,000 weekly   Statin: yes ACE-I/ARB: yes   METER DOWNLOAD SUMMARY: 2/21-3//2025 Average 113 mg/dL  Low 75 mg/dL Highest 272 mg/dL    DIABETIC COMPLICATIONS: Microvascular complications:  Neuropathy  Denies: CKD,  Last eye exam: Completed 2023  Macrovascular complications:  CVA Denies: CAD, PVD   PAST HISTORY: Past Medical History:  Past Medical History:  Diagnosis Date   Acid reflux    ADD (attention deficit disorder)     Anemia    iron deficinecy   Anxiety    with social phobia   Asthma    Balance problem    Chest pain    that occurs with anxiety   COPD (chronic obstructive pulmonary disease) (HCC)    Depression    Diabetes mellitus    Glaucoma    Hyperlipidemia    Hypertension    Normal cardiac stress test 12/09/2015   low risk study   Panic attack    Prediabetes    Recurrent falls    Sleep apnea    Social phobia    Stroke [facility]) 11/2022   ministroke   Past Surgical History:  Past Surgical History:  Procedure Laterality Date   ABDOMINAL HYSTERECTOMY     fibroids, both ovaries left intact   ANKLE SURGERY     BIOPSY  10/14/2023   Procedure: BIOPSY;  Surgeon: Corbin Ade, MD;  [facility];  Service: Endoscopy;;   CHOLECYSTECTOMY     COLONOSCOPY N/A 10/18/2019   Procedure: COLONOSCOPY;  Surgeon: Corbin Ade, MD;  [facility];  Service: Endoscopy;  Laterality: N/A;  1:00-office rescheduled to 11/10 @ 12:45pm   COLONOSCOPY WITH PROPOFOL N/A 10/14/2023   Procedure: COLONOSCOPY WITH PROPOFOL;  Surgeon: Corbin Ade, MD;  [facility];  Service: Endoscopy;  Laterality: N/A;  730am, asa 3   COSMETIC SURGERY     elbow   ESOPHAGOGASTRODUODENOSCOPY (EGD) WITH PROPOFOL N/A 10/14/2023   Procedure: ESOPHAGOGASTRODUODENOSCOPY (EGD)  WITH PROPOFOL;  Surgeon: Corbin Ade, MD;  [facility];  Service: Endoscopy;  Laterality: N/A;   FRACTURE SURGERY     Recurrent elbow surgery   LIPOMA EXCISION  04/30/2012   Procedure: EXCISION LIPOMA;  Surgeon: Fabio Bering, MD;  Location: [facility];  Service: General;  Laterality: Right;  Excision soft tissue mass right thigh   MALONEY DILATION N/A 10/14/2023   Procedure: Elease Hashimoto DILATION;  Surgeon: Corbin Ade, MD;  [facility];  Service: Endoscopy;  Laterality: N/A;   MASS EXCISION Right 12/19/2020   Procedure: EXCISION CYST; 2 CM; AXILLA;  Surgeon: Lucretia Roers, MD;  Location: [facility];   Service: General;  Laterality: Right;   ORIF ANKLE FRACTURE Right 12/15/2017   Procedure: OPEN REDUCTION INTERNAL FIXATION (ORIF) RIGHT ANKLE FRACTURE;  Surgeon: Sheral Apley, MD;  Location: [facility];  Service: Orthopedics;  Laterality: Right;   POLYPECTOMY  10/18/2019   Procedure: POLYPECTOMY;  Surgeon: Corbin Ade, MD;  [facility];  Service: Endoscopy;;  colon   POLYPECTOMY  10/14/2023   Procedure: POLYPECTOMY INTESTINAL;  Surgeon: Corbin Ade, MD;  [facility];  Service: Endoscopy;;   right elbow     reconstruction    Social History:  reports that she has never smoked. She has never used smokeless tobacco. She reports that she does not drink alcohol and does not use drugs. Family History:  Family History  Problem Relation Age of Onset   Hypertension Mother    Diabetes Father    Heart disease Father    Anesthesia problems Neg Hx    Malignant hyperthermia Neg Hx    Hypotension Neg Hx    Pseudochol deficiency Neg Hx      HOME MEDICATIONS: Allergies as of 02/12/2024       Reactions   Aspirin Shortness Of Breath, Itching   Metformin And Related Anaphylaxis   Other Anaphylaxis   Green peas   Oxycodone Itching   Diphenhydramine Hcl Rash   Latex Itching, Rash   Neosporin [neomycin-bacitracin Zn-polymyx] Swelling, Rash, Other (See Comments)   skin peeling        Medication List        Accurate as of February 12, 2024  6:49 AM. If you have any questions, ask your nurse or doctor.          albuterol 108 (90 Base) MCG/ACT inhaler Commonly known as: VENTOLIN HFA Inhale 2 puffs into the lungs every 4 (four) hours as needed for wheezing or shortness of breath.   albuterol (2.5 MG/3ML) 0.083% nebulizer solution Commonly known as: PROVENTIL Take 3 mLs (2.5 mg total) by nebulization every 4 (four) hours as needed for wheezing or shortness of breath.   amLODipine 10 MG tablet Commonly known as: NORVASC Take 1 tablet (10 mg  total) by mouth daily.   Blood Glucose Monitoring Suppl Devi 1 each by Does not apply route in the morning, at noon, and at bedtime. May substitute to any manufacturer covered by patient's insurance.   clobetasol cream 0.05 % Commonly known as: TEMOVATE Apply 1 Application topically 2 (two) times daily.   clopidogrel 75 MG tablet Commonly known as: PLAVIX Take 1 tablet (75 mg total) by mouth daily.   gabapentin 300 MG capsule Commonly known as: NEURONTIN Take 1 capsule (300 mg total) by mouth 3 (three) times daily as needed.   glimepiride 2 MG tablet Commonly known as: AMARYL Take 1 tablet (2 mg  total) by mouth daily before breakfast.   linaclotide 145 MCG Caps capsule Commonly known as: LINZESS Take 1 capsule (145 mcg total) by mouth daily before breakfast.   lisinopril-hydrochlorothiazide 20-12.5 MG tablet Commonly known as: ZESTORETIC Take 1 tablet by mouth daily.   pantoprazole 40 MG tablet Commonly known as: PROTONIX Take 1 tablet (40 mg total) by mouth 2 (two) times daily before a meal.   pregabalin 25 MG capsule Commonly known as: Lyrica Take 1 capsule (25 mg total) by mouth 2 (two) times daily.   rosuvastatin 10 MG tablet Commonly known as: Crestor Take 1 tablet (10 mg total) by mouth daily.   sitaGLIPtin 25 MG tablet Commonly known as: Januvia Take 1 tablet (25 mg total) by mouth daily.   topiramate 50 MG tablet Commonly known as: Topamax Take 1 tablet (50 mg total) by mouth 2 (two) times daily.   traZODone 50 MG tablet Commonly known as: DESYREL Take 1 tablet (50 mg total) by mouth at bedtime as needed.   Vitamin D3 50 MCG (2000 UT) capsule Take 1 capsule (2,000 Units total) by mouth daily.         ALLERGIES: Allergies  Allergen Reactions   Aspirin Shortness Of Breath and Itching   Metformin And Related Anaphylaxis   Other Anaphylaxis    Green peas   Oxycodone Itching   Diphenhydramine Hcl Rash   Latex Itching and Rash   Neosporin  [Neomycin-Bacitracin Zn-Polymyx] Swelling, Rash and Other (See Comments)    skin peeling     REVIEW OF SYSTEMS: A comprehensive ROS was conducted with the patient and is negative except as per HPI     OBJECTIVE:   VITAL SIGNS: There were no vitals taken for this visit.   PHYSICAL EXAM:  General: Pt appears well and is in NAD  Lungs: Clear with good BS bilat   Heart: RRR   Extremities:  Lower extremities - No pretibial edema.   Neuro: MS is good with appropriate affect, pt is alert and Ox3    DM foot exam: 11/13/2023  The skin of the feet is intact without sores or ulcerations. The pedal pulses are 2+ on right and 2+ on left. The sensation is decreased to a screening 5.07, 10 gram monofilament bilaterally   DATA REVIEWED:  Lab Results  Component Value Date   HGBA1C 6.7 (A) 11/13/2023   HGBA1C 6.2 (H) 07/31/2023   HGBA1C 7.9 (H) 04/10/2023    Latest Reference Range & Units 11/13/23 11:46  COMPREHENSIVE METABOLIC PANEL  Rpt !  Sodium 135 - 146 mmol/L 139  Potassium 3.5 - 5.3 mmol/L 4.1  Chloride 98 - 110 mmol/L 101  CO2 20 - 32 mmol/L 30  Glucose 65 - 99 mg/dL 034 (H)  BUN 7 - 25 mg/dL 11  Creatinine 7.42 - 5.95 mg/dL 6.38  Calcium 8.6 - 75.6 mg/dL 9.8  BUN/Creatinine Ratio 6 - 22 (calc) SEE NOTE:  AG Ratio 1.0 - 2.5 (calc) 1.4  AST 10 - 35 U/L 19  ALT 6 - 29 U/L 19  Total Protein 6.1 - 8.1 g/dL 7.4  Total Bilirubin 0.2 - 1.2 mg/dL 1.2    Latest Reference Range & Units 11/13/23 11:46  Total CHOL/HDL Ratio <5.0 (calc) 3.8  Cholesterol <200 mg/dL 433 (H)  HDL Cholesterol > OR = 50 mg/dL 67  LDL Cholesterol (Calc) mg/dL (calc) 295 (H)  MICROALB/CREAT RATIO <30 mg/g creat 5  Non-HDL Cholesterol (Calc) <130 mg/dL (calc) 188 (H)  Triglycerides <150 mg/dL 73  Latest Reference Range & Units 11/13/23 11:46  Alkaline phosphatase (APISO) 37 - 153 U/L 44  Vitamin D, 25-Hydroxy 30 - 100 ng/mL 16 (L)  Globulin 1.9 - 3.7 g/dL (calc) 3.1    Latest Reference Range &  Units 11/13/23 11:46  TSH 0.40 - 4.50 mIU/L 1.05  T4,Free(Direct) 0.8 - 1.8 ng/dL 1.2    Latest Reference Range & Units 11/13/23 11:46  Microalb, Ur mg/dL 0.7  MICROALB/CREAT RATIO <30 mg/g creat 5  Creatinine, Urine 20 - 275 mg/dL 161      ASSESSMENT / PLAN / RECOMMENDATIONS:   1) Type 2 Diabetes Mellitus, Optimally controlled, With macrovascular complications - Most recent A1c of 6.3 %. Goal A1c < 7.0 %.    -A1c continues to be optimal but with a tight BG of 75 Mg/DL I have recommended decreasing glimepiride as below to prevent hypoglycemia -Not a candidate for GLP-1 agonist at this time due to GI issues   MEDICATIONS:  Decrease glimepiride 1 mg daily Continue Januvia 100 mg daily  EDUCATION / INSTRUCTIONS: BG monitoring instructions: Patient is instructed to check her blood sugars 1 times a day. Call [facility] if: BG persistently < 70  I reviewed the Rule of 15 for the treatment of hypoglycemia in detail with the patient. Literature supplied.   2) Diabetic complications:  Eye: Does not have known diabetic retinopathy.  Neuro/ Feet: Does  have known diabetic peripheral neuropathy. Renal: Patient does not have known baseline CKD. She is  on an ACEI/ARB at present.  3) Dyslipidemia :   -I have increased her atorvastatin 11/2023 due to elevated lipid panel  Medication Continue rosuvastatin 10 mg daily  4)Vitamin D Deficiency:  -Patient states she has completed ergocalciferol, patient was advised to start OTC vitamin D3 at 2000 international units daily  Follow-up in 6 months  Signed electronically by: Lyndle Herrlich, MD  [facility]  [facility] [address] [place] [place], [place] [address] Phone: [phone] FAX: [phone]   CC: Rica Records, FNP [phone] [address] [place] [address] Phone: [phone]  Fax: [phone]    Return to Endocrinology clinic as below: Future  Appointments  Date Time Provider Department Center  02/12/2024 10:30 AM Wallis Spizzirri, Konrad Dolores, MD [facility] None  02/25/2024 10:00 AM Rankin, Denice Bors B, NP [facility] None  02/29/2024 10:00 AM Tiffany Kocher, PA-C [facility]  04/29/2024 10:00 AM Del Nigel Berthold, FNP [facility]

## 2024-02-12 ENCOUNTER — Encounter: Payer: Self-pay | Admitting: Internal Medicine

## 2024-02-12 ENCOUNTER — Ambulatory Visit: Payer: Medicaid Other | Admitting: Internal Medicine

## 2024-02-12 VITALS — BP 124/82 | HR 76 | Ht 61.0 in | Wt 159.0 lb

## 2024-02-12 DIAGNOSIS — E785 Hyperlipidemia, unspecified: Secondary | ICD-10-CM

## 2024-02-12 DIAGNOSIS — Z7984 Long term (current) use of oral hypoglycemic drugs: Secondary | ICD-10-CM

## 2024-02-12 DIAGNOSIS — E559 Vitamin D deficiency, unspecified: Secondary | ICD-10-CM | POA: Diagnosis not present

## 2024-02-12 DIAGNOSIS — E1142 Type 2 diabetes mellitus with diabetic polyneuropathy: Secondary | ICD-10-CM

## 2024-02-12 LAB — POCT GLYCOSYLATED HEMOGLOBIN (HGB A1C): Hemoglobin A1C: 6.3 % — AB (ref 4.0–5.6)

## 2024-02-12 MED ORDER — GLIMEPIRIDE 1 MG PO TABS: 1.0000 mg | ORAL_TABLET | Freq: Every day | ORAL | 3 refills | Status: DC

## 2024-02-12 MED ORDER — SITAGLIPTIN PHOSPHATE 100 MG PO TABS: 100.0000 mg | ORAL_TABLET | Freq: Every day | ORAL | 3 refills | Status: DC

## 2024-02-12 NOTE — Patient Instructions (Addendum)
  Change glimepiride 1 mg, 1 tablet before breakfast Continue Januvia 100 mg daily  Start over the counter Vitamin D3 at 2000 international unit daily     HOW TO TREAT LOW BLOOD SUGARS (Blood sugar LESS THAN 70 MG/DL) Please follow the RULE OF 15 for the treatment of hypoglycemia treatment (when your (blood sugars are less than 70 mg/dL)   STEP 1: Take 15 grams of carbohydrates when your blood sugar is low, which includes:  3-4 GLUCOSE TABS  OR 3-4 OZ OF JUICE OR REGULAR SODA OR ONE TUBE OF GLUCOSE GEL    STEP 2: RECHECK blood sugar in 15 MINUTES STEP 3: If your blood sugar is still low at the 15 minute recheck --> then, go back to STEP 1 and treat AGAIN with another 15 grams of carbohydrates.

## 2024-02-22 ENCOUNTER — Ambulatory Visit: Payer: Medicaid Other | Admitting: Gastroenterology

## 2024-02-23 ENCOUNTER — Telehealth (HOSPITAL_COMMUNITY): Payer: Self-pay

## 2024-02-23 NOTE — Telephone Encounter (Signed)
 Lvm to confirm 02/25/24 appt by 12 02/24/24

## 2024-02-24 NOTE — Telephone Encounter (Signed)
 Called pt no answer left vm cancelled appt

## 2024-02-25 ENCOUNTER — Ambulatory Visit (HOSPITAL_COMMUNITY): Admitting: Registered Nurse

## 2024-02-27 NOTE — Progress Notes (Deleted)
 GI Office Note    Referring Provider: Wylene Men* Primary Care Physician:  Rica Records, FNP  Primary Gastroenterologist:  Chief Complaint   No chief complaint on file.   History of Present Illness   Kayla Choi is a 58 y.o. female presenting today for follow up. Last seen 01/2024.  History of poor appetite and weight loss since her stroke 11/2022. H/o adenomatous colon polyps, constipation, pill dysphagia, epigastric pain.    Complianed of sore throat/hoarseness since EGD/ED. We increaesd pantoprazole to BID. Plans to check for H.pylori eradication. Switched from [place] to [employer] for constiaption. Plans to address fatty liver at next ov.   Today:  BPE 01/2024: normal.     Abd U/S 05/2023: IMPRESSION: 1. Diffuse increased echogenicity throughout the liver is nonspecific and may be seen with hepatic steatosis or other underlying intrinsic liver disease. 2. The gallbladder is surgically absent. 3. No other abnormalities.   EGD 10/2023: -slight nodularity and erosion of the distal esophagus s/p maloney dilation and bx, reactive squamous mucos, scant cardia type gastric mucosa showing minimal chronic gastritis -small hiatal hernia. Abnormal gastric mucosa of uncertain significance s/p bx with marked chronic active gastritis, h.pylori present -normal duodenal bulb and second portion of the duodenum   Colonoscopy 10/2023: -two 3-73mm polyps in the descending colon and ascending colon removed, tubular adenoma -next colonoscopy 7 years         Medications   Current Outpatient Medications  Medication Sig Dispense Refill   albuterol (PROVENTIL) (2.5 MG/3ML) 0.083% nebulizer solution Take 3 mLs (2.5 mg total) by nebulization every 4 (four) hours as needed for wheezing or shortness of breath. 75 mL 2   albuterol (VENTOLIN HFA) 108 (90 Base) MCG/ACT inhaler Inhale 2 puffs into the lungs every 4 (four) hours as needed for wheezing or  shortness of breath. 18 g 2   amLODipine (NORVASC) 10 MG tablet Take 1 tablet (10 mg total) by mouth daily. 90 tablet 1   Blood Glucose Monitoring Suppl (ACCU-CHEK GUIDE) w/Device KIT 3 (three) times daily. as directed     Blood Glucose Monitoring Suppl DEVI 1 each by Does not apply route in the morning, at noon, and at bedtime. May substitute to any manufacturer covered by patient's insurance. 1 each 0   Cholecalciferol (VITAMIN D3) 50 MCG (2000 UT) capsule Take 1 capsule (2,000 Units total) by mouth daily. 90 capsule 1   clobetasol cream (TEMOVATE) 0.05 % Apply 1 Application topically 2 (two) times daily. 30 g 0   clopidogrel (PLAVIX) 75 MG tablet Take 1 tablet (75 mg total) by mouth daily. 30 tablet 3   gabapentin (NEURONTIN) 300 MG capsule Take 1 capsule (300 mg total) by mouth 3 (three) times daily as needed. 90 capsule 3   glimepiride (AMARYL) 1 MG tablet Take 1 tablet (1 mg total) by mouth daily with breakfast. 90 tablet 3   linaclotide (LINZESS) 145 MCG CAPS capsule Take 1 capsule (145 mcg total) by mouth daily before breakfast. 30 capsule 5   lisinopril-hydrochlorothiazide (ZESTORETIC) 20-12.5 MG tablet Take 1 tablet by mouth daily. 90 tablet 3   pantoprazole (PROTONIX) 40 MG tablet Take 1 tablet (40 mg total) by mouth 2 (two) times daily before a meal. 60 tablet 5   pregabalin (LYRICA) 25 MG capsule Take 1 capsule (25 mg total) by mouth 2 (two) times daily. 60 capsule 0   rosuvastatin (CRESTOR) 10 MG tablet Take 1 tablet (10 mg total) by mouth daily. 90  tablet 3   sitaGLIPtin (JANUVIA) 100 MG tablet Take 1 tablet (100 mg total) by mouth daily. 90 tablet 3   topiramate (TOPAMAX) 50 MG tablet Take 1 tablet (50 mg total) by mouth 2 (two) times daily. 60 tablet 6   traZODone (DESYREL) 50 MG tablet Take 1 tablet (50 mg total) by mouth at bedtime as needed. 30 tablet 3   No current facility-administered medications for this visit.    Allergies   Allergies as of 02/29/2024 - Review Complete  02/12/2024  Allergen Reaction Noted   Aspirin Shortness Of Breath and Itching 09/03/2011   Metformin and related Anaphylaxis 04/02/2012   Other Anaphylaxis 04/15/2012   Oxycodone Itching 06/25/2014   Diphenhydramine hcl Rash 03/28/2014   Latex Itching and Rash 01/29/2007   Neosporin [neomycin-bacitracin zn-polymyx] Swelling, Rash, and Other (See Comments) 04/15/2012     Past Medical History   Past Medical History:  Diagnosis Date   Acid reflux    ADD (attention deficit disorder)    Anemia    iron deficinecy   Anxiety    with social phobia   Asthma    Balance problem    Chest pain    that occurs with anxiety   COPD (chronic obstructive pulmonary disease) (HCC)    Depression    Diabetes mellitus    Glaucoma    Hyperlipidemia    Hypertension    Normal cardiac stress test 12/09/2015   low risk study   Panic attack    Prediabetes    Recurrent falls    Sleep apnea    Social phobia    Stroke [facility]) 11/2022   ministroke    Past Surgical History   Past Surgical History:  Procedure Laterality Date   ABDOMINAL HYSTERECTOMY     fibroids, both ovaries left intact   ANKLE SURGERY     BIOPSY  10/14/2023   Procedure: BIOPSY;  Surgeon: Corbin Ade, MD;  [facility]: [facility];  Service: Endoscopy;;   CHOLECYSTECTOMY     COLONOSCOPY N/A 10/18/2019   Procedure: COLONOSCOPY;  Surgeon: Corbin Ade, MD;  [facility];  Service: Endoscopy;  Laterality: N/A;  1:00-office rescheduled to 11/10 @ 12:45pm   COLONOSCOPY WITH PROPOFOL N/A 10/14/2023   Procedure: COLONOSCOPY WITH PROPOFOL;  Surgeon: Corbin Ade, MD;  [facility]: [facility];  Service: Endoscopy;  Laterality: N/A;  730am, asa 3   COSMETIC SURGERY     elbow   ESOPHAGOGASTRODUODENOSCOPY (EGD) WITH PROPOFOL N/A 10/14/2023   Procedure: ESOPHAGOGASTRODUODENOSCOPY (EGD) WITH PROPOFOL;  Surgeon: Corbin Ade, MD;  [facility];  Service: Endoscopy;  Laterality: N/A;   FRACTURE SURGERY      Recurrent elbow surgery   LIPOMA EXCISION  04/30/2012   Procedure: EXCISION LIPOMA;  Surgeon: Fabio Bering, MD;  Location: [facility];  Service: General;  Laterality: Right;  Excision soft tissue mass right thigh   MALONEY DILATION N/A 10/14/2023   Procedure: Elease Hashimoto DILATION;  Surgeon: Corbin Ade, MD;  [facility];  Service: Endoscopy;  Laterality: N/A;   MASS EXCISION Right 12/19/2020   Procedure: EXCISION CYST; 2 CM; AXILLA;  Surgeon: Lucretia Roers, MD;  Location: [facility];  Service: General;  Laterality: Right;   ORIF ANKLE FRACTURE Right 12/15/2017   Procedure: OPEN REDUCTION INTERNAL FIXATION (ORIF) RIGHT ANKLE FRACTURE;  Surgeon: Sheral Apley, MD;  Location: [facility];  Service: Orthopedics;  Laterality: Right;   POLYPECTOMY  10/18/2019   Procedure:  POLYPECTOMY;  Surgeon: Corbin Ade, MD;  [facility];  Service: Endoscopy;;  colon   POLYPECTOMY  10/14/2023   Procedure: POLYPECTOMY INTESTINAL;  Surgeon: Corbin Ade, MD;  [facility];  Service: Endoscopy;;   right elbow     reconstruction    Past Family History   Family History  Problem Relation Age of Onset   Hypertension Mother    Diabetes Father    Heart disease Father    Anesthesia problems Neg Hx    Malignant hyperthermia Neg Hx    Hypotension Neg Hx    Pseudochol deficiency Neg Hx     Past Social History   Social History   Socioeconomic History   Marital status: Single    Spouse name: Not on file   Number of children: 2   Years of education: Not on file   Highest education level: Some college, no degree  Occupational History   Not on file  Tobacco Use   Smoking status: Never   Smokeless tobacco: Never  Vaping Use   Vaping status: Never Used  Substance and Sexual Activity   Alcohol use: No    Alcohol/week: 0.0 standard drinks of alcohol   Drug use: No   Sexual activity: Yes    Birth control/protection: Surgical  Other Topics Concern   Not  on file  Social History Narrative   Not on file   Social Drivers of Health   Financial Resource Strain: High Risk (10/29/2023)   Overall Financial Resource Strain (CARDIA)    Difficulty of Paying Living Expenses: Hard  Food Insecurity: Food Insecurity Present (10/29/2023)   Hunger Vital Sign    Worried About Running Out of Food in the Last Year: Often true    Ran Out of Food in the Last Year: Sometimes true  Transportation Needs: Unmet Transportation Needs (10/29/2023)   PRAPARE - Transportation    Lack of Transportation (Medical): Yes    Lack of Transportation (Non-Medical): Yes  Physical Activity: Sufficiently Active (10/29/2023)   Exercise Vital Sign    Days of Exercise per Week: 5 days    Minutes of Exercise per Session: 40 min  Stress: Stress Concern Present (10/29/2023)   [employer] of Occupational Health - Occupational Stress Questionnaire    Feeling of Stress : To some extent  Social Connections: Moderately Integrated (10/29/2023)   Social Connection and Isolation Panel [NHANES]    Frequency of Communication with Friends and Family: More than three times a week    Frequency of Social Gatherings with Friends and Family: Three times a week    Attends Religious Services: Never    Active Member of Clubs or Organizations: Yes    Attends Banker Meetings: 1 to 4 times per year    Marital Status: Living with partner  Intimate Partner Violence: Not At Risk (12/02/2022)   Humiliation, Afraid, Rape, and Kick questionnaire    Fear of Current or Ex-Partner: No    Emotionally Abused: No    Physically Abused: No    Sexually Abused: No    Review of Systems   General: Negative for anorexia, weight loss, fever, chills, fatigue, weakness. ENT: Negative for hoarseness, difficulty swallowing , nasal congestion. CV: Negative for chest pain, angina, palpitations, dyspnea on exertion, peripheral edema.  Respiratory: Negative for dyspnea at rest, dyspnea on exertion,  cough, sputum, wheezing.  GI: See history of present illness. GU:  Negative for dysuria, hematuria, urinary incontinence, urinary frequency, nocturnal urination.  Endo:  Negative for unusual weight change.     Physical Exam   There were no vitals taken for this visit.   General: Well-nourished, well-developed in no acute distress.  Eyes: No icterus. Mouth: Oropharyngeal mucosa moist and pink , no lesions erythema or exudate. Lungs: Clear to auscultation bilaterally.  Heart: Regular rate and rhythm, no murmurs rubs or gallops.  Abdomen: Bowel sounds are normal, nontender, nondistended, no hepatosplenomegaly or masses,  no abdominal bruits or hernia , no rebound or guarding.  Rectal: ***  Extremities: No lower extremity edema. No clubbing or deformities. Neuro: Alert and oriented x 4   Skin: Warm and dry, no jaundice.   Psych: Alert and cooperative, normal mood and affect.  Labs   *** Imaging Studies   MM 3D SCREENING MAMMOGRAM BILATERAL BREAST Result Date: 02/16/2024 CLINICAL DATA:  Screening. EXAM: DIGITAL SCREENING BILATERAL MAMMOGRAM WITH TOMOSYNTHESIS AND CAD TECHNIQUE: Bilateral screening digital craniocaudal and mediolateral oblique mammograms were obtained. Bilateral screening digital breast tomosynthesis was performed. The images were evaluated with computer-aided detection. COMPARISON:  Previous exam(s). ACR Breast Density Category b: There are scattered areas of fibroglandular density. FINDINGS: There are no findings suspicious for malignancy. IMPRESSION: No mammographic evidence of malignancy. A result letter of this screening mammogram will be mailed directly to the patient. RECOMMENDATION: Screening mammogram in one year. (Code:SM-B-01Y) BI-RADS CATEGORY  1: Negative. Electronically Signed   By: Emmaline Kluver M.D.   On: 02/16/2024 12:02   DG ESOPHAGUS W DOUBLE CM (HD) Result Date: 01/29/2024 CLINICAL DATA:  Patient with history of CVA, dysphagia s/p esophageal dilation  10/2023 with persistent dysphagia who presents today for esophagram. Per patient dysphagia is daily, occurs with liquids/solids/pills, intermittent sensation of choking, occasional vomiting and regurgitation. She denies symptoms of acid reflux. EXAM: ESOPHAGUS/BARIUM SWALLOW/TABLET STUDY TECHNIQUE: Single contrast examination was performed using thin liquid barium. This exam was performed by Lynnette Caffey, PA-C, and was supervised and interpreted by Marliss Coots, MD. FLUOROSCOPY: Radiation Exposure Index (as provided by the fluoroscopic device): 16.70 mGy Kerma COMPARISON:  None Available. FINDINGS: Swallowing: Appears normal. No vestibular penetration or aspiration seen. Pharynx: Unremarkable. Esophagus: Normal appearance. Esophageal motility: Within normal limits. Hiatal Hernia: None. Gastroesophageal reflux: No spontaneous gastroesophageal reflux. Ingested 13 mm barium tablet: Passed normally Other: Patient unable to tolerate gas crystals - states she vomits every time when drinking carbonated beverages. IMPRESSION: Normal single contrast esophagram. Electronically Signed   By: Marliss Coots M.D.   On: 01/29/2024 12:37    Assessment    GERD: -typical symptoms well controlled, occasional breakthrough heartburn -reinforced anti-reflux measures   Dysphagia -continues with pill dysphagia -BPE   Sore throat/hoarseness: -notes since her EGD/ED -?LPR -increase pantoprazole to BID, if no improvement she may require ENT evaluation -OV in 4 weeks   H.yplori gastritis: -H.pylori stool antigen to confirm eradication. She is aware of need to be off PPI, antibiotics, Pepto for 2 weeks before stool collection   Constipation:  -stop amitiza -start Linzess daily   Weight loss: -overall has stabilized, continue to monitor   Fatty liver: -noted on u/s 05/2023 -will discuss further at next ov       PLAN   ***   Leanna Battles. Melvyn Neth, MHS, PA-C [facility]

## 2024-02-29 ENCOUNTER — Encounter: Payer: Self-pay | Admitting: Gastroenterology

## 2024-02-29 ENCOUNTER — Ambulatory Visit: Admitting: Gastroenterology

## 2024-03-07 ENCOUNTER — Encounter (HOSPITAL_COMMUNITY): Payer: Self-pay | Admitting: Registered Nurse

## 2024-03-07 ENCOUNTER — Ambulatory Visit (INDEPENDENT_AMBULATORY_CARE_PROVIDER_SITE_OTHER): Admitting: Registered Nurse

## 2024-03-07 DIAGNOSIS — G4700 Insomnia, unspecified: Secondary | ICD-10-CM | POA: Diagnosis not present

## 2024-03-07 DIAGNOSIS — F411 Generalized anxiety disorder: Secondary | ICD-10-CM

## 2024-03-07 DIAGNOSIS — F330 Major depressive disorder, recurrent, mild: Secondary | ICD-10-CM | POA: Diagnosis not present
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
## 2024-03-07 NOTE — Patient Instructions (Signed)
 Call 911, 988, mobile crisis, or present to the nearest emergency room should you experience any suicidal/homicidal ideation, auditory/visual/hallucinations, or detrimental worsening of your mental health.  Mobile Crisis Response Teams Listed by counties in vicinity of [facility] providers [facility] [phone] [facility] [phone] [facility] [phone] [facility] [phone] [place]                * [facility] [phone]                * [facility] [phone]  [facility] [phone] [facility]  [phone] * [facility] [phone]      Here are a List of outpatient providers that accept Medicaid and private insurance.  They offer counseling/therapy virtually some also offer in person:    [facility] Phone: [phone] Physical Address:  [address], [address] [place], [place]  [address]  Outpatient Services Life can be a challenge for [place] all. [facility] outpatient services offer a caring and experienced team of professionals who help people take the first step, which is often the most difficult. Together, we develop a well-defined and customized plan for each person that meets the individual's needs and goals. Each plan includes evidence-based practices as proven strategies that work. From board-certified psychiatrists, registered nurses, therapists, and outpatient office administrative professionals--all care and want to help you and your loved ones in every way possible to ensure you succeed.  Open Access:   One way we ensure we get people the help they need when they request is is through Open Access. This service encourages individuals who are in dire  need of our services and are new to [facility] to simply walk in or call us for virtual options, Monday through Friday between 8 a.m. and 3 p.m. On the same day of contact, if the individual has time to do so, he/she/they will complete patient registration and a comprehensive clinical assessment with a therapist. The assessment will provide treatment recommendations and the individual will leave with an appointment for the next service or a referral to the proper level of care.  While this process takes a few hours and is longer than a traditional appointment, it reduces what could otherwise be months of waiting for help or an appointment.   Telehealth Services:  [facility] telehealth services provide a safe, secure, and easy way to connect with a therapist or mental health provider for an individual or group therapy appointment. Click here to learn more about how [facility] telehealth services provide an important treatment option. These services may be accessed from the comfort of an individual's home, or at one of [facility]'s behavioral health offices such as this one where an individual may use on-site equipment for the visit.   Telehealth Services   A SAFE, SECURE, CONVENIENT TREATMENT OPTION:  [facility] telehealth services provide you with a safe, secure, and easy way to connect with your therapist or mental health provider for an individual or group therapy appointment.  Using Psychologist, prison and probation services, telehealth appointments allow you to meet with [employer], therapists, nurse practitioners, and psychiatrists from your desktop or laptop computer, cell phone, or tablet device. Telehealth visits are compliant with all Health Insurance Portability and Accountability Act (HIPAA) requirements and you can complete a telehealth visit from just about anywhere using internet or wi-fi access.  HOW DOES IT WORK?  [facility] uses  the Doxy.me platform to host telehealth appointments. Prior to your scheduled visit,  you will receive a direct link via text or email which will take you to your provider's online waiting room. Simply click that link at your appointment time and your provider will be notified that you've arrived. He or she will meet you online and you will complete your visit. Your provider may also have resources and information posted in his or her virtual waiting room which you may find helpful throughout your treatment.  In addition, you may receive a reminder telephone call from a [place] [facility] team member in the days leading up to your appointment. During that call, you will have an opportunity to provide important health information and medication updates which may save time during your scheduled appointment.     WHO USES TELEHEALTH SERVICES?  Telehealth services provide an alternative to in-person, face-to-face treatment for individuals receiving outpatient behavioral health services. At [facility], telehealth visits may also be used by individuals receiving Assertive Community Treatment (ACT) Team and Individual Placement and Support (IPS) services and other community-based, specialized services as needed. Telehealth services are also used for group therapy sessions, allowing people we support to connect during treatment with others who have similar experiences.       Address:  [address] [place], [place] [address] Outpatient Hours: Mon-Fri 8AM to 5PM Phone: [phone] Fax: [phone] Offers: [facility]) Hours: 24/7 Phone: [phone] Fax: [phone] --- Address:  [address] [place], [place] [address] Hours: Mon-Fri 8AM-5PM Phone: [phone] Fax: [phone]  Address: [address] [place], [place] [address] Hours: Outpatient: Mon-Fri 8AM to 5PM [facility]) Hours: 24/7 Phone: [phone] Fax: [phone]  Services: Adult Services Advanced Access Walk-In Assessments - All Disabilities If  you're a walk-in patient there is generally a wait time involved on a "first come, first served basis" otherwise contact us beforehand to setup an appointment. If you're in crisis please use our 24-Hour Crisis Hotline for immediate access to a clinician. If this is your first time, please bring the following with you:   Insurance/Medicaid Card  ID or Social Security Card Any referral documentation Routine Outpatient Therapy Psychiatry/Med Management Substance Abuse Intensive Outpatient (SAIOP) Medication Assisted Treatment - MAT (Suboxone) Tailored Care Management (TCM) Youth Services Advanced Access Walk-In Assessments - All Disabilities Routine Outpatient Therapy Psychiatry/Med Management Tailored Care Management (TCM)   Behavioral Health Care in [place], [place] [facility] in [place], [place] provides Integrated Behavioral Health Services to people of all ages. The Integrated Behavioral Health Program applies an evidence-based approach to integrate behavioral health into primary care. This model incorporates mental health treatment into a traditional medical visit. Our philosophical standard values comprehensive wellness for patients. The program is led by Dale [place], [facility] Director. Aimee is a Administrator, Civil Service (LCSW). Our organizational vision in implementing this program is to improve patient wellness by serving as a Publishing rights manager for thorough healthcare management. [facility] provides high-quality care for behavioral health patients of all ages, including: Common mental health diagnoses such as Anxiety, Depression, ADD/ADHD, Bipolar, and PTSD Substance Abuse Evaluations and Counseling NARCAN/Naloxone Distribution Our Behavioral Health Clinicians [facility]) are ready to help you problem-solve through life stressors to promote healthy coping skills. Together we can identify how past  challenges, such as traumatic events, are contributing to how you're functioning today. Our BHCs are happy to incorporate the principles of  your value system to nurture your healing needs. We offer HIPAA-compliant trauma-informed telehealth and in-person therapy to residents of [place] and [place] at our [place], [place], and [place], [place] medical centers. We look forward to meeting and working with you.  We'd love to hear from you!  OUR CONTACT INFO Address:  [address] [place], [place] [address] Hours Operation Monday 8:00AM - 5:00PM Tuesday 8:00AM - 7:00PM Wednesday 8:00AM - 5:00PM Thursday 8:00AM - 5:00PM Friday 8:00AM - 12:00PM  CALL us P: [phone] F: [phone] Email:  [email or web address]  Facebook/Messenger:  [email or web address] , [email or web address]   Hospital doctor:  [email or web address]   Get better, faster with quality mental health care Our providers take a hands-on approach to help you see improvement at every step, no matter how severe your symptoms. Just come as you are and let us take it from there.  Appointments in as little as 2 days Receive quick guidance and support by speaking with a licensed professional in a matter of hours.  Care for even the most severe cases We offer care for mild to severe depression, anxiety, and more --including Crisis Care for adults with elevated suicide risk.  Personalized plans unique to you Our treatment plans are tailored to your specific needs, providing you a clear path to success.  1:1 support from start to finish Get paired with a dedicated provider to serve as your single point of contact throughout treatment.  Results-based care, built for you Your expert provider will ensure your plan is grounded in data, lending support from beginning to end. Choose psychiatry, therapy, or both.  Psychiatry When medication is necessary, our psychiatric providers get it right fast --analyzing 100+ data  points to determine which treatment is likely to be most tolerable and effective for you.  Therapy Our virtual program combines cognitive and behavioral therapy with independent skill practice--all of which have been clinically proven to work for a wide range of symptoms so you can get better, and stay better.  Crisis Care A first-of-its-kind program for individuals with elevated suicide risk, Crisis Care is based on the Collaborative Assessment and Management of Suicidality (CAMS) framework--a care model that's backed by 30 years of research.  Teen Care (new) Give your teen a safe space to connect and get personal support. Our expert therapists help teens 13 and up with many common concerns--from school and peer pressure to anxiety and friendships. Psychiatry services available, if appropriate.  Virtual, dedicated support every step of the way 1:1 Video Sessions Let your provider know how you're feeling, get to know you, and provide 1:1 support.  Anytime Messaging Get questions or concerns off your chest between video visits by messaging your provider at any time.  Interactive Lessons Learn how to integrate new thought and behavior patterns into your daily life.  Proactive Progress Tracking Complete weekly check-ins so your provider can track your progress and, if necessary, adjust your treatment and/or medication.  1:1 Video Sessions Let your provider know how you're feeling, get to know you, and provide 1:1 support.   [facility] [employer], [place].  Address:  [address] [place], [place] [address]  Phone:  [phone] Website:  [email or web address] We are here to serve clients ages 48 - 98, who are challenged by a multitude of behavioral health concerns to include substance use disorders, and complex mental health scenarios where both medical and psychiatric conditions coexist. Moreover, as a trained Healthcare Chaplain, I seek to provide incarnation al  care. The aim  of " [facility]" is to be incarnation al. Therefore, to provide a holistic treatment approach to all who call upon our services.  Behavioral Health Services & Treatments Depression  Substance Use Disorders  Mood Disorders  Psychodynamic Psychotherapy  [place] Charity fundraiser) Therapy Anxiety Disorders ADHD Urine Toxicology Screening Psychiatric Medication Management  Insurance [address], [address], [address], [address] and [place], [address] and [employer], [place], [place], [place], [employer], Control and instrumentation engineer, [employer], [employer] (UMR), UnitedHealthcare UHC  UBH, [employer] of Network    Therapist, sports Health Counselor Associate, [place], [facility], CCTP Available both in-person and online [address] [place], [place] [address]  Phone:  [phone]  Specialties and Soil scientist Anxiety Depression Coping Skills Expertise ADHD Behavioral Issues Oppositional Defiance (ODD) School Issues Self Esteem Stress Trauma and PTSD  How are you doing? Not what you tell others, Be honest with yourself How are you really doing?Marland Kitchen. Let's talk about what's really on your mind. I specialize in supporting individuals, children, and families as they navigate life's challenges and work toward healing. My background includes experience in crisis support as a first responder, which has deepened my understanding of how acute and long-term stress impacts mental health. I use a person-centered approach, ensuring that your unique strengths, experiences, and goals guide our work together. My therapeutic style is eclectic, drawing from evidence-based modalities such as Cognitive Behavioral Therapy (CBT), Trauma-Focused Therapy, mindfulness practices, and somatic techniques to create a personalized path toward wellness.   Susa Simmonds Licensed Clinical Mental Health Counselor Associate, LCMHC-A, [facility] Available both in-person and online Location:   [place] - [address], [place], [place], [address] Phone: [phone] Website:  [email or web address] Hello! I'm Susa Simmonds, LCMHC-A, NCC. I earned my Master's in Clinical Mental Health Counseling and a Certificate in Marriage and Family Counseling from [facility] [employer]. My experience includes roles as a Science writer for individuals on the Autism Spectrum. I gained most of my clinical experience at a children's center, collaborating with a multidisciplinary team to provide top-notch care. I'm passionate about working with children, adolescents, young adults, and families facing anxiety, depression, trauma, self-esteem issues, and relational challenges. I use play therapy and expressive art techniques when traditional talk therapy isn't effective. My approaches include cognitive-behavioral, solution-focused, trauma-focused therapies, and MATCH-ADTC (Modular Approach to Therapy for Children with Anxiety, Depression, Trauma, and Conduct problems). As your therapist, I'll tailor support to your needs and offer at-home activities to help you achieve your goals. My focus is on fostering self-awareness, growth, and confidence in a supportive environment of hope and empowerment. Outside work, I enjoy reading, trying new foods, and spending time with loved ones. Conditions Treated ADHD Anxiety/Phobias/Panic attacks Autism Bipolar disorder Childhood behavioral issues Couple's issues Depression Family Focus and concentration LGBTQ+ Obsessions or compulsions Postpartum or Peripartum Issues PTSD or Trauma Stress management   Nira Conn Counselor, [facility], LCASA, NCC (she, her) Available online only Location:  [place], [place] [address] Phone:  [phone] My clients can expect an environment free of judgment, full of understanding and empathy, where they can face the challenges that  they have in a supportive environment. My personal background allows me to look at client issues from a different view and offer real world healing that meets the client where they are. My client's come from various backgrounds, but all seeking to better understand themselves and a better way of coping. Because I'm seeing clients virtually, they feel  freer to share more intimate and personal concerns that might be harder to talk about in person. Top Specialties LGBTQ+ Sex Therapy Expertise Addiction ADHD Anxiety Behavioral Issues Bipolar Disorder Body Positivity Borderline Personality (BPD) Cancer Child Chronic Pain Coping Skills Depression Life Transitions Marital and Premarital Obesity Open Relationships Non-Monogamy Parenting Peer Relationships Relationship Issues Self Esteem Sex-Positive, Kink Allied Sexual Abuse Sexual Addiction Stress Trauma and PTSD Weight Loss   [employer] Professional, MDiv, Med Available online only [address] [place], [place] [address] Phone:  [phone] Website:  [email or web address] Welcome! My name is Brewing technologist Scales, and I am passionate about helping individuals navigate life's challenges, develop resilience, and unlock their full potential. With over a decade of experience in counseling, ministry, and coaching, I provide a holistic approach to personal and spiritual development, drawing on my diverse professional background and extensive training. Administrator Spirituality Education and Learning Disabilities Mood Disorders Expertise ADHD Anxiety Pension scheme manager Counseling Child Coping Skills Depression Family Conflict First Responders Grief Intellectual Disability Life Coaching Life Transitions Marital and Premarital Parenting Peer Relationships Relationship Issues School Issues Self Esteem Sexual Abuse Teen Violence Trauma and PTSD   Frederic Jericho, MSW,  LCSW[facility] [address] [place], [place] [address] Office [phone] Fax [phone] Sometimes in life trying situations occur that may be difficult to handle alone. My goal is to help you successfully manage these challenges so that you can experience a more balanced and fulfilling life. I offer an eclectic approach to therapy, which includes solution-focused, reality-based, person-centered approaches. I will assist you in breaking the cycle of negative thinking and behaviors. If you are in need of support or guidance in order to make your life more manageable, I welcome the opportunity to assist you in doing so. My goal is to empower you to live a life of personal growth and positive well-being. Please call [phone] or email [email or web address] for an individual and family therapy or consultation today.   Contact Details  Locate [place] at [address], [place], [place] [address] [address] [place] [address] [place], [place] [address]  We are a full telehealth service company as we do not do in person visits.  Message or Call us at  Email:  [email or web address]   Phone: [phone]  FAX: [phone]  Who We Are [facility] is a leading provider of personalized mental health care in [place], [place], dedicated to fostering healing and growth in a safe, compassionate environment. Our team of experienced professionals offers innovative solutions tailored to meet the unique needs of each individual, helping them overcome challenges and improve their overall quality of life. We accept most major insurances.  You Are Our Top Priority  Mission Statement [facility] offers personalized mental health care in a safe and compassionate environment. Our team provides effective treatments and innovative solutions that empower clients to overcome challenges and improve their quality of life.  Vision Statement [facility] aims to  redefine mental health care standards through excellence, compassion, and innovation. We aim to make mental health care accessible and destigmatized, ensuring everyone receives the necessary support to thrive.  Services: Anxiety Depression Bipolar disorder Personality Disorder PTSD ADHD Schizophrenia Mood disorder Insomnia Neuropsychological Testing for ADHD and Other Disorders  Comprehensive neuropsychological testing for a better tomorrow Understanding Neuropsychological Testing Neuropsychological testing is a specialized evaluation method used to assess cognitive functions, behaviors, and mental processing abilities. These tests are essential for diagnosing and managing  conditions such as Attention-Deficit/Hyperactivity Disorder (ADHD), learning disabilities, autism spectrum disorders, mood disorders, and other neurological or psychological conditions. Unlike traditional assessments, neuropsychological testing provides objective, measurable data about an individual's cognitive abilities. This helps clinicians develop personalized treatment plans that address specific challenges and improve daily functioning.  Why Neuropsychological Testing is Important for ADHD ADHD is a complex neurodevelopmental disorder that affects attention, impulsivity, and executive function. Because its symptoms often overlap with other conditions, neuropsychological testing helps differentiate ADHD from other disorders, ensuring an accurate diagnosis and appropriate intervention. Through comprehensive testing, clinicians can evaluate:  Attention and Focus - Identifying difficulties in sustained and selective attention Executive Functioning - Assessing skills such as impulse control, problem-solving, and organization Memory and Processing Speed - Measuring how quickly and efficiently information is understood and retained Behavioral and Emotional Regulation - Understanding mood-related symptoms that may be  associated with ADHD Key Neuropsychological Tests Neuropsychological assessments typically include a combination of standardized tests that evaluate different cognitive domains. Some of the most commonly used tests include:  Continuous Performance Test (CPT) Measures sustained attention and response control. Helps identify inattention, impulsivity, and distractibility. Stroop Test Assesses cognitive flexibility and processing speed. Evaluates an individual's ability to control automatic responses and focus on specific tasks. [employer] Test (WCST) Measures executive function, problem-solving, and cognitive flexibility. Helps assess an individual's ability to adapt to changing rules and instructions. Trail Making Test (TMT) Assesses visual attention, processing speed, and task-switching abilities. Commonly used to detect cognitive impairments in ADHD and other disorders. N-Back Test Evaluates working memory and attentional capacity. Helps measure an individual's ability to hold and manipulate information over short periods.  Neuropsychological Testing for Other Disorders Beyond ADHD, neuropsychological testing is a crucial tool for diagnosing and managing a wide range of neurological and psychological conditions. These assessments can help identify cognitive deficits and guide targeted interventions for various disorders, including: Learning Disabilities Helps assess difficulties in reading, writing, or mathematics Identifies underlying cognitive challenges that may be affecting academic performance Autism Spectrum Disorder (ASD) Evaluates cognitive flexibility, attention, and executive functioning Helps determine the presence of social and communication deficits Mood Disorders (Depression, Anxiety, Bipolar Disorder) Identifies cognitive impairments related to emotional regulation and stress response Assesses memory, attention, and problem-solving skills affected by mood  disorders Traumatic Brain Injury (TBI) and Concussions Measures cognitive changes due to brain injuries Helps monitor recovery progress and guide rehabilitation efforts Dementia and Neurodegenerative Disorders Evaluates memory, processing speed, and executive function in conditions like Alzheimer's and Parkinson's disease Assists in early detection and progression monitoring  Benefits of Neuropsychological Testing Accurate Diagnosis - Differentiates ADHD from other conditions with similar symptoms, such as anxiety or learning disabilities Personalized Treatment Planning - Identifies cognitive strengths and weaknesses to tailor individualized intervention Tracking Progress - Enables clinicians to monitor changes over time and assess the effectiveness of treatments or medication Guiding Educational and Workplace Accommodations - Provides data to support school or workplace modifications for individuals struggling with cognitive challenges  Who Should Consider Neuropsychological Testing? Individuals experiencing difficulties with attention, memory, problem-solving, or emotional regulation may benefit from neuropsychological testing. This assessment is ideal for: Children and adults with suspected ADHD or learning disabilities Individuals with behavioral or emotional challenges affecting daily life Those recovering from neurological injuries or illnesses impacting cognitive function Anyone seeking a deeper understanding of their cognitive abilities to optimize performance in school, work, or daily activities  What to Expect During a Neuropsychological Evaluation The testing process typically includes: Clinical Interview - Gathering medical  and developmental history Standardized Testing - Completing various cognitive tasks on a computer or paper Behavioral Assessments - Using rating scales and questionnaires completed by the individual and/or caregivers Comprehensive Report - A detailed  breakdown of strengths, weaknesses, and personalized recommendations    Offers Virtual Therapy  [address] [place], [place] [address]  Phone:  [phone] Fax: [phone]  [facility] Outpatient Program (OP) An Outpatient Treatment Program, or OP, is a service for individuals seeking support for substance abuse or mental health concerns who do not require frequent or intense support or safety monitoring. This level is appropriate for people with less severe disorders, or as a step-down from more intensive services. At [facility], OP is available for individuals aged 7 and above. It consists of basic treatment services offered in individual and group format and totals to less than 9 hours a week. Both virtual and in-person options for attending are available. OP is conducted by treatment professionals licensed to serve individuals with mental health and substance use disorders within [place]. OP helps individuals address a broad range of psychological and interpersonal challenges including: Substance Related Disorders Behavioral Addictions Anxiety Depression Trauma and Stressor Related Issues PTSD Parenting and Family Issues Relationship Issues Stress Self-Esteem Bipolar Disorders Life Transitions Personality Disorders ADHD Grief and Loss  *[facility] is proud to offer specialized treatment services on an outpatient level including, EMDR, Marriage/Family Counseling and Christian Counseling  We currently accept all major insurance, including: Medicaid,Uninsured, [employer], [address], [place], [employer], [place], [employer], [employer], [employer], [employer] Phone:  [phone] Website:  [email or web address]           How to get started with virtual therapy covered by Medicaid: Medicaid-covered members can call our Admissions Team 24/7 or fill out  our online form to learn about their plan's specific benefits and get started with treatment. Once we verify your Medicaid benefits, our Clinical Team will conduct a thorough mental health assessment to create your personalized treatment plan. Medicaid members can get started with their personalized treatment plan (which includes curated groups, individual therapy, and family therapy) in as little as 24 hours. Call [phone]  What we Treat:  Anxiety Treatment for Teens and Adults  Our therapists specialize in cognitive behavioral therapy, a leading anxiety treatment, to provide evidence-based mental healthcare for teens and adults dealing with anxiety disorders. Fill out the short form below or call us directly to start healing from anxiety today with [facility].  Depression Treatment for Teens and Adults Depression affects millions of people worldwide, but healing is possible with evidence-based treatment.   Trauma Treatment for Teens and Adults After surviving trauma, building connections and receiving trauma-informed care are critical for long-lasting healing. That's why [facility] offers trauma-informed therapy in individual and group sessions.   Self-Harm Treatment for Teens and Adults Self-harm is often linked to serious mental health issues, which is why understanding the root of self-harm is key to long-lasting recovery.   Suicidal ideation Passive suicidal ideation, chronic suicidal ideation, previous suicide attempt  Substance Use Disorders Treatment for Teens and Adults:   Alcohol, marijuana, prescription drugs, opioids, amphetamines, cocaine, inhalants, hallucinogens, nicotine Understanding the mental health roots of substance use disorders (SUD) is key to long-lasting recovery.

## 2024-04-02 LAB — LIPID PANEL
Chol/HDL Ratio: 3.2 ratio (ref 0.0–4.4)
Cholesterol, Total: 225 mg/dL — ABNORMAL HIGH (ref 100–199)
HDL: 70 mg/dL (ref >39)
LDL Chol Calc (NIH): 143 mg/dL — ABNORMAL HIGH (ref 0–99)
Triglycerides: 71 mg/dL (ref 0–149)
VLDL Cholesterol Cal: 12 mg/dL (ref 5–40)

## 2024-04-02 LAB — CBC WITH DIFFERENTIAL/PLATELET
Basophils Absolute: 0 10*3/uL (ref 0.0–0.2)
Basos: 1 %
EOS (ABSOLUTE): 0.1 10*3/uL (ref 0.0–0.4)
Eos: 2 %
Hematocrit: 41.8 % (ref 34.0–46.6)
Hemoglobin: 13.5 g/dL (ref 11.1–15.9)
Immature Grans (Abs): 0 10*3/uL (ref 0.0–0.1)
Immature Granulocytes: 0 %
Lymphocytes Absolute: 1.9 10*3/uL (ref 0.7–3.1)
Lymphs: 37 %
MCH: 30.3 pg (ref 26.6–33.0)
MCHC: 32.3 g/dL (ref 31.5–35.7)
MCV: 94 fL (ref 79–97)
Monocytes Absolute: 0.3 10*3/uL (ref 0.1–0.9)
Monocytes: 6 %
Neutrophils Absolute: 2.9 10*3/uL (ref 1.4–7.0)
Neutrophils: 54 %
Platelets: 363 10*3/uL (ref 150–450)
RBC: 4.45 x10E6/uL (ref 3.77–5.28)
RDW: 11.8 % (ref 11.7–15.4)
WBC: 5.2 10*3/uL (ref 3.4–10.8)

## 2024-04-02 LAB — BMP8+EGFR
BUN/Creatinine Ratio: 13 (ref 9–23)
BUN: 13 mg/dL (ref 6–24)
CO2: 25 mmol/L (ref 20–29)
Calcium: 9.8 mg/dL (ref 8.7–10.2)
Chloride: 103 mmol/L (ref 96–106)
Creatinine, Ser: 0.98 mg/dL (ref 0.57–1.00)
Glucose: 146 mg/dL — ABNORMAL HIGH (ref 70–99)
Potassium: 4.2 mmol/L (ref 3.5–5.2)
Sodium: 138 mmol/L (ref 134–144)
eGFR: 67 mL/min/{1.73_m2} (ref >59)

## 2024-04-02 LAB — HEMOGLOBIN A1C
Est. average glucose Bld gHb Est-mCnc: 154 mg/dL
Hgb A1c MFr Bld: 7 % — ABNORMAL HIGH (ref 4.8–5.6)

## 2024-04-02 LAB — ANA W/REFLEX: Anti Nuclear Antibody (ANA): NEGATIVE

## 2024-04-02 LAB — RHEUMATOID FACTOR: Rheumatoid fact SerPl-aCnc: 10 [IU]/mL (ref <14.0)

## 2024-04-06 ENCOUNTER — Encounter: Payer: Self-pay | Admitting: Family Medicine

## 2024-04-22 NOTE — Telephone Encounter (Signed)
 Spoke with patient will move appt time up to 920  for CPE

## 2024-04-29 ENCOUNTER — Encounter: Payer: Self-pay | Admitting: Family Medicine

## 2024-04-29 ENCOUNTER — Ambulatory Visit (INDEPENDENT_AMBULATORY_CARE_PROVIDER_SITE_OTHER): Payer: Medicaid Other | Admitting: Family Medicine

## 2024-04-29 VITALS — BP 150/86 | HR 70 | Resp 16 | Ht 61.0 in | Wt 156.4 lb

## 2024-04-29 DIAGNOSIS — Z0001 Encounter for general adult medical examination with abnormal findings: Secondary | ICD-10-CM | POA: Diagnosis not present

## 2024-04-29 DIAGNOSIS — J441 Chronic obstructive pulmonary disease with (acute) exacerbation: Secondary | ICD-10-CM | POA: Diagnosis not present

## 2024-04-29 DIAGNOSIS — Z113 Encounter for screening for infections with a predominantly sexual mode of transmission: Secondary | ICD-10-CM

## 2024-04-29 MED ORDER — GABAPENTIN 300 MG PO CAPS: 300.0000 mg | ORAL_CAPSULE | Freq: Two times a day (BID) | ORAL | 3 refills | Status: DC | PRN

## 2024-04-29 MED ORDER — AMLODIPINE BESYLATE 10 MG PO TABS: 10.0000 mg | ORAL_TABLET | Freq: Every day | ORAL | 1 refills | Status: AC

## 2024-04-29 MED ORDER — VITAMIN D3 50 MCG (2000 UT) PO CAPS: 2000.0000 [IU] | ORAL_CAPSULE | Freq: Every day | ORAL | 1 refills | Status: AC

## 2024-04-29 MED ORDER — SITAGLIPTIN PHOSPHATE 100 MG PO TABS: 100.0000 mg | ORAL_TABLET | Freq: Every day | ORAL | 3 refills | Status: AC

## 2024-04-29 MED ORDER — PANTOPRAZOLE SODIUM 40 MG PO TBEC: 40.0000 mg | DELAYED_RELEASE_TABLET | Freq: Two times a day (BID) | ORAL | 5 refills | Status: AC

## 2024-04-29 MED ORDER — TOPIRAMATE 50 MG PO TABS: 50.0000 mg | ORAL_TABLET | Freq: Two times a day (BID) | ORAL | 6 refills | Status: AC

## 2024-04-29 MED ORDER — LINACLOTIDE 145 MCG PO CAPS: 145.0000 ug | ORAL_CAPSULE | Freq: Every day | ORAL | 5 refills | Status: AC

## 2024-04-29 MED ORDER — CLOPIDOGREL BISULFATE 75 MG PO TABS
75.0000 mg | ORAL_TABLET | Freq: Every day | ORAL | 3 refills | Status: DC
Start: 2024-04-29 — End: 2024-10-05

## 2024-04-29 MED ORDER — ALBUTEROL SULFATE (2.5 MG/3ML) 0.083% IN NEBU: 2.5000 mg | INHALATION_SOLUTION | RESPIRATORY_TRACT | 5 refills | Status: DC | PRN

## 2024-04-29 MED ORDER — ALBUTEROL SULFATE HFA 108 (90 BASE) MCG/ACT IN AERS: 2.0000 | INHALATION_SPRAY | RESPIRATORY_TRACT | 5 refills | Status: AC | PRN

## 2024-04-29 MED ORDER — ROSUVASTATIN CALCIUM 40 MG PO TABS: 40.0000 mg | ORAL_TABLET | Freq: Every day | ORAL | 3 refills | Status: AC

## 2024-04-29 MED ORDER — GLIPIZIDE 5 MG PO TABS: 5.0000 mg | ORAL_TABLET | Freq: Two times a day (BID) | ORAL | 3 refills | Status: DC

## 2024-04-29 MED ORDER — TRAZODONE HCL 50 MG PO TABS: 50.0000 mg | ORAL_TABLET | Freq: Every evening | ORAL | 3 refills | Status: AC | PRN

## 2024-04-29 MED ORDER — LISINOPRIL-HYDROCHLOROTHIAZIDE 20-12.5 MG PO TABS: 1.0000 | ORAL_TABLET | Freq: Every day | ORAL | 3 refills | Status: AC

## 2024-04-29 NOTE — Progress Notes (Signed)
 Complete physical exam  Patient: Kayla Choi   DOB: [birth year]   58 y.o. Female  MRN: [id number]  Subjective:     Chief Complaint  Patient presents with   Annual Exam    Her medication was left at her old house and she currently has been out of them since the end of April. Would need everything she is supposed to be taking called back in to [place] is a 58 y.o. female who presents today for a complete physical exam. She reports consuming a general diet. Walking 30 minutes 3 times per week She generally feels well. She reports sleeping okay  She does have additional problems to discuss today.    Most recent fall risk assessment:    04/29/2024    9:46 AM  Fall Risk   Falls in the past year? 1  Number falls in past yr: 1  Injury with Fall? 0     Most recent depression screenings:    04/29/2024    9:46 AM 03/07/2024    9:12 AM  PHQ 2/9 Scores  PHQ - 2 Score 2   PHQ- 9 Score 9      Information is confidential and restricted. Go to Review Flowsheets to unlock data.    Vision:Not within last year  and Dental: No current dental problems and Receives regular dental care  Patient Care Team: Del Amber Bail, Rogerio Clay, FNP as PCP - General (Family Medicine) Riley Cheadle, Windsor Hatcher, MD as Consulting Physician (Gastroenterology) Lisabeth Rider, MD as Referring Physician (Neurology) [facility], [facility]    Outpatient Medications Prior to Visit  Medication Sig   Blood Glucose Monitoring Suppl (ACCU-CHEK GUIDE) w/Device KIT 3 (three) times daily. as directed (Patient not taking: Reported on 04/29/2024)   Blood Glucose Monitoring Suppl DEVI 1 each by Does not apply route in the morning, at noon, and at bedtime. May substitute to any manufacturer covered by patient's insurance. (Patient not taking: Reported on 04/29/2024)   clobetasol  cream (TEMOVATE ) 0.05 % Apply 1 Application topically 2 (two) times daily. (Patient not taking: Reported on  04/29/2024)   pregabalin  (LYRICA ) 25 MG capsule Take 1 capsule (25 mg total) by mouth 2 (two) times daily.   [DISCONTINUED] albuterol  (PROVENTIL ) (2.5 MG/3ML) 0.083% nebulizer solution Take 3 mLs (2.5 mg total) by nebulization every 4 (four) hours as needed for wheezing or shortness of breath. (Patient not taking: Reported on 04/29/2024)   [DISCONTINUED] albuterol  (VENTOLIN  HFA) 108 (90 Base) MCG/ACT inhaler Inhale 2 puffs into the lungs every 4 (four) hours as needed for wheezing or shortness of breath. (Patient not taking: Reported on 04/29/2024)   [DISCONTINUED] amLODipine  (NORVASC ) 10 MG tablet Take 1 tablet (10 mg total) by mouth daily. (Patient not taking: Reported on 04/29/2024)   [DISCONTINUED] Cholecalciferol (VITAMIN D3) 50 MCG (2000 UT) capsule Take 1 capsule (2,000 Units total) by mouth daily. (Patient not taking: Reported on 04/29/2024)   [DISCONTINUED] clopidogrel  (PLAVIX ) 75 MG tablet Take 1 tablet (75 mg total) by mouth daily. (Patient not taking: Reported on 04/29/2024)   [DISCONTINUED] gabapentin  (NEURONTIN ) 300 MG capsule Take 1 capsule (300 mg total) by mouth 3 (three) times daily as needed. (Patient not taking: Reported on 04/29/2024)   [DISCONTINUED] glimepiride  (AMARYL ) 1 MG tablet Take 1 tablet (1 mg total) by mouth daily with breakfast. (Patient not taking: Reported on 04/29/2024)   [DISCONTINUED] linaclotide  (LINZESS ) 145 MCG CAPS capsule Take 1 capsule (145 mcg total) by mouth daily before  breakfast. (Patient not taking: Reported on 04/29/2024)   [DISCONTINUED] lisinopril -hydrochlorothiazide  (ZESTORETIC ) 20-12.5 MG tablet Take 1 tablet by mouth daily. (Patient not taking: Reported on 04/29/2024)   [DISCONTINUED] pantoprazole  (PROTONIX ) 40 MG tablet Take 1 tablet (40 mg total) by mouth 2 (two) times daily before a meal. (Patient not taking: Reported on 04/29/2024)   [DISCONTINUED] rosuvastatin  (CRESTOR ) 10 MG tablet Take 1 tablet (10 mg total) by mouth daily. (Patient not taking: Reported  on 04/29/2024)   [DISCONTINUED] sitaGLIPtin  (JANUVIA ) 100 MG tablet Take 1 tablet (100 mg total) by mouth daily. (Patient not taking: Reported on 04/29/2024)   [DISCONTINUED] topiramate  (TOPAMAX ) 50 MG tablet Take 1 tablet (50 mg total) by mouth 2 (two) times daily. (Patient not taking: Reported on 04/29/2024)   [DISCONTINUED] traZODone  (DESYREL ) 50 MG tablet Take 1 tablet (50 mg total) by mouth at bedtime as needed. (Patient not taking: Reported on 04/29/2024)   No facility-administered medications prior to visit.    Review of Systems  Constitutional:  Negative for chills and fever.  Eyes:  Negative for pain.  Respiratory:  Negative for shortness of breath.   Cardiovascular:  Negative for palpitations.  Gastrointestinal:  Negative for abdominal pain.  Genitourinary:  Negative for dysuria.  Musculoskeletal:  Positive for myalgias.  Neurological:  Positive for headaches. Negative for dizziness.       Objective:    BP (!) 150/86   Pulse 70   Resp 16   Ht 5\' 1"  (1.549 m)   Wt 156 lb 6.4 oz (70.9 kg)   SpO2 93%   BMI 29.55 kg/m  BP Readings from Last 3 Encounters:  04/29/24 (!) 150/86  02/12/24 124/82  01/25/24 (!) 159/96      Physical Exam Vitals reviewed.  Constitutional:      General: She is not in acute distress.    Appearance: Normal appearance. She is not ill-appearing, toxic-appearing or diaphoretic.  HENT:     Head: Normocephalic.     Right Ear: Tympanic membrane normal.     Left Ear: Tympanic membrane normal.     Nose: Nose normal.     Mouth/Throat:     Mouth: Mucous membranes are moist.  Eyes:     General:        Right eye: No discharge.        Left eye: No discharge.     Conjunctiva/sclera: Conjunctivae normal.     Pupils: Pupils are equal, round, and reactive to light.  Cardiovascular:     Rate and Rhythm: Normal rate.     Pulses: Normal pulses.     Heart sounds: Normal heart sounds.  Pulmonary:     Effort: Pulmonary effort is normal. No respiratory  distress.     Breath sounds: Normal breath sounds.  Abdominal:     General: Bowel sounds are normal.     Palpations: Abdomen is soft.     Tenderness: There is no abdominal tenderness. There is no right CVA tenderness, left CVA tenderness or guarding.  Musculoskeletal:        General: Normal range of motion.     Cervical back: Normal range of motion.  Skin:    General: Skin is warm and dry.     Capillary Refill: Capillary refill takes less than 2 seconds.  Neurological:     Mental Status: She is alert.     Coordination: Coordination normal.     Gait: Gait normal.  Psychiatric:        Mood and Affect: Mood normal.  Behavior: Behavior normal.      No results found for any visits on 04/29/24.    Assessment & Plan:    Routine Health Maintenance and Physical Exam  Immunization History  Administered Date(s) Administered   DTaP 01/30/1966, 02/27/1966, 04/03/1966, 03/17/1967, 03/26/1971   Influenza Whole 10/19/2007   Influenza,inj,Quad PF,6+ Mos 10/06/2014, 10/08/2015, 09/02/2017, 11/07/2020   MMR 02/03/1967, 08/02/1996   Measles 02/03/1967   Pneumococcal Polysaccharide-23 04/15/2013   Rubella 02/28/1971   Smallpox 10/01/1967, 12/17/1967   Td 08/02/1996   Tdap 09/02/2017    Health Maintenance  Topic Date Due   COVID-19 Vaccine (1) Never done   Zoster Vaccines- Shingrix (1 of 2) Never done   Pneumococcal Vaccine 57-61 Years old (2 of 2 - PCV) 04/15/2014   OPHTHALMOLOGY EXAM  03/19/2023   INFLUENZA VACCINE  07/08/2024   HEMOGLOBIN A1C  09/30/2024   Diabetic kidney evaluation - Urine ACR  11/12/2024   FOOT EXAM  11/12/2024   Diabetic kidney evaluation - eGFR measurement  03/31/2025   MAMMOGRAM  02/10/2026   Colonoscopy  10/13/2026   DTaP/Tdap/Td (8 - Td or Tdap) 09/03/2027   Hepatitis C Screening  Completed   HIV Screening  Completed   HPV VACCINES  Aged Out   Meningococcal B Vaccine  Aged Out    Discussed health benefits of physical activity, and encouraged  her to engage in regular exercise appropriate for her age and condition.  Routine screening for STI (sexually transmitted infection) -     NuSwab Vaginitis Plus (VG+)  COPD with acute exacerbation (HCC) -     Albuterol  Sulfate; Take 3 mLs (2.5 mg total) by nebulization every 4 (four) hours as needed for wheezing or shortness of breath.  Dispense: 75 mL; Refill: 5  Encounter for routine adult physical exam with abnormal findings Assessment & Plan: A comprehensive physical examination was completed, and reviewed recents labs with patient with plan of care Screening and health maintenance recommendations have been updated. The patient received counseling on exercise and nutrition. BMI was assessed and discussed Advise for heart health, focus on: Eat more fruits and vegetables: Aim for a variety of colors. Choose whole grains: Brown rice, oats, and whole-wheat bread. Limit unhealthy fats: Avoid trans fats; use olive or avocado oil instead. Include lean proteins: Opt for fish, chicken, beans, and legumes. Reduce sodium: Limit processed foods and add less salt. Stay hydrated: Drink plenty of water . Exercise regularly: Aim for at least 30 minutes of moderate exercise, like walking or cycling, 5 days a week.     Other orders -     Albuterol  Sulfate HFA; Inhale 2 puffs into the lungs every 4 (four) hours as needed for wheezing or shortness of breath.  Dispense: 18 g; Refill: 5 -     amLODIPine  Besylate; Take 1 tablet (10 mg total) by mouth daily. Blood pressure medication  Dispense: 90 tablet; Refill: 1 -     Lisinopril -hydroCHLOROthiazide ; Take 1 tablet by mouth daily. Blood pressure medication  Dispense: 90 tablet; Refill: 3 -     Vitamin D3; Take 1 capsule (2,000 Units total) by mouth daily.  Dispense: 90 capsule; Refill: 1 -     Clopidogrel  Bisulfate; Take 1 tablet (75 mg total) by mouth daily. Blood thinner- prevent blood clots  Dispense: 30 tablet; Refill: 3 -     Gabapentin ; Take 1 capsule  (300 mg total) by mouth 2 (two) times daily as needed (For pain).  Dispense: 60 capsule; Refill: 3 -  SITagliptin  Phosphate; Take 1 tablet (100 mg total) by mouth daily. Diabetes medication  Dispense: 90 tablet; Refill: 3 -     glipiZIDE ; Take 1 tablet (5 mg total) by mouth 2 (two) times daily before a meal. Diabetes medication  Dispense: 60 tablet; Refill: 3 -     linaCLOtide ; Take 1 capsule (145 mcg total) by mouth daily before breakfast. Constipation  Dispense: 30 capsule; Refill: 5 -     Pantoprazole  Sodium; Take 1 tablet (40 mg total) by mouth 2 (two) times daily before a meal. Heartburn  Dispense: 60 tablet; Refill: 5 -     Rosuvastatin  Calcium ; Take 1 tablet (40 mg total) by mouth daily. Cholesterol medication  Dispense: 90 tablet; Refill: 3 -     Topiramate ; Take 1 tablet (50 mg total) by mouth 2 (two) times daily. For Migraines  Dispense: 60 tablet; Refill: 6 -     traZODone  HCl; Take 1 tablet (50 mg total) by mouth at bedtime as needed for sleep.  Dispense: 30 tablet; Refill: 3    Return in about 3 months (around 07/30/2024), or if symptoms worsen or fail to improve, for chronic follow-up.     Avelino Lek Amber Bail, FNP

## 2024-04-29 NOTE — Patient Instructions (Signed)

## 2024-04-29 NOTE — Assessment & Plan Note (Signed)
 A comprehensive physical examination was completed, and reviewed recents labs with patient with plan of care Screening and health maintenance recommendations have been updated. The patient received counseling on exercise and nutrition. BMI was assessed and discussed Advise for heart health, focus on: Eat more fruits and vegetables: Aim for a variety of colors. Choose whole grains: Brown rice, oats, and whole-wheat bread. Limit unhealthy fats: Avoid trans fats; use olive or avocado oil instead. Include lean proteins: Opt for fish, chicken, beans, and legumes. Reduce sodium: Limit processed foods and add less salt. Stay hydrated: Drink plenty of water . Exercise regularly: Aim for at least 30 minutes of moderate exercise, like walking or cycling, 5 days a week.

## 2024-05-01 ENCOUNTER — Ambulatory Visit: Payer: Self-pay | Admitting: Family Medicine

## 2024-05-03 ENCOUNTER — Ambulatory Visit: Payer: Self-pay | Admitting: Family Medicine

## 2024-05-03 ENCOUNTER — Other Ambulatory Visit: Payer: Self-pay | Admitting: Family Medicine

## 2024-05-03 LAB — NUSWAB VAGINITIS PLUS (VG+)
Atopobium vaginae: HIGH {score} — AB
BVAB 2: HIGH {score} — AB
Candida albicans, NAA: NEGATIVE
Candida glabrata, NAA: NEGATIVE
Megasphaera 1: HIGH {score} — AB

## 2024-05-03 MED ORDER — METRONIDAZOLE 0.75 % VA GEL: 1.0000 | Freq: Every day | VAGINAL | 0 refills | Status: AC

## 2024-05-12 ENCOUNTER — Telehealth: Payer: Self-pay | Admitting: Pharmacy Technician

## 2024-05-12 ENCOUNTER — Other Ambulatory Visit: Payer: Self-pay | Admitting: Family Medicine

## 2024-05-12 ENCOUNTER — Other Ambulatory Visit (HOSPITAL_COMMUNITY): Payer: Self-pay

## 2024-05-12 DIAGNOSIS — E119 Type 2 diabetes mellitus without complications: Secondary | ICD-10-CM

## 2024-05-12 MED ORDER — FREESTYLE LIBRE 3 SENSOR MISC: 2 refills | Status: AC

## 2024-05-12 NOTE — Telephone Encounter (Signed)
 sent

## 2024-05-12 NOTE — Telephone Encounter (Signed)
 Mychart message sent.

## 2024-05-12 NOTE — Telephone Encounter (Signed)
 Pharmacy Patient Advocate Encounter   Received notification from CoverMyMeds that prior authorization for FreeStyle Libre 3 Sensor is required/requested.   Insurance verification completed.   The patient is insured through [facility] .   Patient must be treated with insulin  in order for medicaid to approve coverage. Please advise.

## 2024-05-12 NOTE — Telephone Encounter (Signed)
 Please let patient know freestyle libre not covered due  to: she must be treated with insulin  in order approve coverage.

## 2024-05-31 ENCOUNTER — Encounter: Payer: Self-pay | Admitting: *Deleted

## 2024-06-14 NOTE — Telephone Encounter (Signed)
 Called pt as requested. LVM with office number asking for call back to discuss.

## 2024-06-29 ENCOUNTER — Telehealth: Payer: Self-pay

## 2024-06-29 NOTE — Telephone Encounter (Signed)
 Copied from CRM [id number]. Topic: Clinical - Prescription Issue >> Jun 29, 2024  3:47 PM Delon DASEN wrote: Reason for CRM: Ernestine with [facility]- sent request for Omnipod 5- fax [phone] verbal line [phone]

## 2024-07-01 NOTE — Telephone Encounter (Signed)
 We do not prescribe insulin  pumps. Needs to be from an endocrinologist

## 2024-08-05 ENCOUNTER — Encounter: Payer: Self-pay | Admitting: Family Medicine

## 2024-08-05 ENCOUNTER — Ambulatory Visit: Admitting: Family Medicine

## 2024-08-05 ENCOUNTER — Ambulatory Visit (HOSPITAL_COMMUNITY)
Admission: RE | Admit: 2024-08-05 | Discharge: 2024-08-05 | Disposition: A | Discharge status: Home / Self Care | Source: Ambulatory Visit | Attending: Family Medicine | Admitting: Family Medicine

## 2024-08-05 VITALS — BP 158/87 | HR 103 | Ht 61.0 in | Wt 159.0 lb

## 2024-08-05 DIAGNOSIS — E1169 Type 2 diabetes mellitus with other specified complication: Secondary | ICD-10-CM

## 2024-08-05 DIAGNOSIS — M542 Cervicalgia: Secondary | ICD-10-CM | POA: Insufficient documentation

## 2024-08-05 DIAGNOSIS — I10 Essential (primary) hypertension: Secondary | ICD-10-CM | POA: Diagnosis not present

## 2024-08-05 DIAGNOSIS — Z7984 Long term (current) use of oral hypoglycemic drugs: Secondary | ICD-10-CM

## 2024-08-05 DIAGNOSIS — E038 Other specified hypothyroidism: Secondary | ICD-10-CM

## 2024-08-05 MED ORDER — TIZANIDINE HCL 4 MG PO TABS: 4.0000 mg | ORAL_TABLET | Freq: Two times a day (BID) | ORAL | 2 refills | Status: DC | PRN

## 2024-08-05 NOTE — Progress Notes (Signed)
 Established Patient Office Visit   Subjective  Patient ID: Kayla Choi, female    DOB: December 20, 1965  Age: 58 y.o. MRN: [id number]  Chief Complaint  Patient presents with   Medical Management of Chronic Issues    She  has a past medical history of Acid reflux, ADD (attention deficit disorder), Anemia, Anxiety, Asthma, Balance problem, Chest pain, COPD (chronic obstructive pulmonary disease) (HCC), Depression, Diabetes mellitus, Glaucoma, Hyperlipidemia, Hypertension, Normal cardiac stress test (12/09/2015), Panic attack, Prediabetes, Recurrent falls, Sleep apnea, Social phobia, and Stroke (HCC) (11/2022).  The patient reports recurrent, constant neck pain that has been gradually worsening. The pain is located in the occipital region and is rated 5/10 in severity. Symptoms are worse at night and are aggravated by certain positions and bending. Pertinent negatives include no chest pain, fever, syncope, trouble swallowing, or weakness. The patient has tried heat and ice, which provided only mild relief.    Review of Systems  Constitutional:  Negative for chills and fever.  Respiratory:  Negative for shortness of breath.   Cardiovascular:  Negative for palpitations.  Genitourinary:  Negative for dysuria.  Musculoskeletal:  Positive for myalgias and neck pain.      Objective:     BP (!) 158/87 Comment: without medication  Pulse (!) 103   Ht 5' 1 (1.549 m)   Wt 159 lb (72.1 kg)   BMI 30.04 kg/m  BP Readings from Last 3 Encounters:  08/05/24 (!) 158/87  04/29/24 (!) 150/86  02/12/24 124/82      Physical Exam Vitals reviewed.  Constitutional:      General: She is not in acute distress.    Appearance: Normal appearance. She is not ill-appearing, toxic-appearing or diaphoretic.  HENT:     Head: Normocephalic.  Eyes:     General:        Right eye: No discharge.        Left eye: No discharge.     Conjunctiva/sclera: Conjunctivae normal.  Cardiovascular:     Rate and  Rhythm: Normal rate.     Pulses: Normal pulses.     Heart sounds: Normal heart sounds.  Pulmonary:     Effort: Pulmonary effort is normal. No respiratory distress.     Breath sounds: Normal breath sounds.  Musculoskeletal:     Cervical back: Pain with movement present. Decreased range of motion.  Skin:    General: Skin is warm and dry.     Capillary Refill: Capillary refill takes less than 2 seconds.  Neurological:     Mental Status: She is alert.     Coordination: Coordination normal.     Gait: Gait normal.  Psychiatric:        Mood and Affect: Mood normal.        Behavior: Behavior normal.        Thought Content: Thought content normal.      No results found for any visits on 08/05/24.  The ASCVD Risk score (Arnett DK, et al., 2019) failed to calculate for the following reasons:   Risk score cannot be calculated because patient has a medical history suggesting prior/existing ASCVD    Assessment & Plan:  Type 2 diabetes mellitus with other specified complication, without long-term current use of insulin  (HCC) Assessment & Plan: Last Hemoglobin A1c: 7.0 Labs: Ordered today, results pending; will follow up accordingly. The patient reports adhering to prescribed medications: Januvia  100 mg once daily, Glipizide  5 mg twice daily before breakfast    Reviewed non-pharmacological interventions, including  a balanced diet rich in lean proteins, healthy fats, whole grains, and high-fiber vegetables. Emphasized reducing refined sugars and processed carbohydrates, and incorporating more fruits, leafy greens, and legumes. Education: Patient was educated on recognizing signs and symptoms of both hypoglycemia and hyperglycemia, and advised to seek emergency care if these symptoms occur. Follow-Up: Scheduled for follow-up in 3-4 months, or sooner if needed. Patient Understanding: The patient verbalized understanding of the care plan, and all questions were answered. Additional Care:  Ophthalmology referral placed. Foot exam results were within normal limits    Orders: -     Hemoglobin A1c -     Ambulatory referral to Ophthalmology  Primary hypertension -     Lipid panel -     BMP8+eGFR -     CBC with Differential/Platelet  TSH (thyroid -stimulating hormone deficiency) -     TSH + free T4  Neck pain Assessment & Plan: Xray ordered Start Zanaflex  4 mg PRN I explained to the patient that non-pharmacological interventions include the application of ice or heat, rest, and recommended range of motion exercises along with gentle stretching. For pain management, Tylenol  was advised. The patient was instructed to follow up if symptoms worsen or persist. The patient verbalized understanding of the care plan, and all questions were answered.   Orders: -     DG Cervical Spine Complete; Future  Essential hypertension Assessment & Plan: Patient reported not taking her blood pressure medication today Follow up in 2 weeks to recheck blood pressure- advise the importance of medication compliance to reduce cardiovascular risk Continue Lisinopril -hydrochlorothiazide  20-12.5 and amlodipine  10 mg Labs ordered. Continued discussion on DASH diet, low sodium diet and maintain a exercise routine for 150 minutes per week.    Other orders -     tiZANidine  HCl; Take 1 tablet (4 mg total) by mouth 2 (two) times daily as needed for muscle spasms.  Dispense: 60 tablet; Refill: 2    Return in about 4 months (around 12/05/2024), or if symptoms worsen or fail to improve, for chronic follow-up and 2 week follow up nurse visit for recheck blood pressure afternoon.   Hilario Kidd Wilhelmena Falter, FNP

## 2024-08-05 NOTE — Assessment & Plan Note (Signed)
 Xray ordered Start Zanaflex  4 mg PRN I explained to the patient that non-pharmacological interventions include the application of ice or heat, rest, and recommended range of motion exercises along with gentle stretching. For pain management, Tylenol  was advised. The patient was instructed to follow up if symptoms worsen or persist. The patient verbalized understanding of the care plan, and all questions were answered.

## 2024-08-05 NOTE — Assessment & Plan Note (Signed)
 Last Hemoglobin A1c: 7.0 Labs: Ordered today, results pending; will follow up accordingly. The patient reports adhering to prescribed medications: Januvia  100 mg once daily, Glipizide  5 mg twice daily before breakfast    Reviewed non-pharmacological interventions, including a balanced diet rich in lean proteins, healthy fats, whole grains, and high-fiber vegetables. Emphasized reducing refined sugars and processed carbohydrates, and incorporating more fruits, leafy greens, and legumes. Education: Patient was educated on recognizing signs and symptoms of both hypoglycemia and hyperglycemia, and advised to seek emergency care if these symptoms occur. Follow-Up: Scheduled for follow-up in 3-4 months, or sooner if needed. Patient Understanding: The patient verbalized understanding of the care plan, and all questions were answered. Additional Care: Ophthalmology referral placed. Foot exam results were within normal limits

## 2024-08-05 NOTE — Patient Instructions (Signed)

## 2024-08-05 NOTE — Assessment & Plan Note (Signed)
 Patient reported not taking her blood pressure medication today Follow up in 2 weeks to recheck blood pressure- advise the importance of medication compliance to reduce cardiovascular risk Continue Lisinopril -hydrochlorothiazide  20-12.5 and amlodipine  10 mg Labs ordered. Continued discussion on DASH diet, low sodium diet and maintain a exercise routine for 150 minutes per week.

## 2024-08-10 ENCOUNTER — Ambulatory Visit: Payer: Self-pay | Admitting: Family Medicine

## 2024-08-10 ENCOUNTER — Other Ambulatory Visit: Payer: Self-pay | Admitting: Family Medicine

## 2024-08-10 LAB — HEMOGLOBIN A1C
Est. average glucose Bld gHb Est-mCnc: 137 mg/dL
Hgb A1c MFr Bld: 6.4 % — ABNORMAL HIGH (ref 4.8–5.6)

## 2024-08-10 LAB — TSH+FREE T4
Free T4: 1.21 ng/dL (ref 0.82–1.77)
TSH: 1.76 u[IU]/mL (ref 0.450–4.500)

## 2024-08-10 LAB — LIPID PANEL
Chol/HDL Ratio: 2.2 ratio (ref 0.0–4.4)
Cholesterol, Total: 130 mg/dL (ref 100–199)
HDL: 58 mg/dL (ref >39)
LDL Chol Calc (NIH): 57 mg/dL (ref 0–99)
Triglycerides: 72 mg/dL (ref 0–149)
VLDL Cholesterol Cal: 15 mg/dL (ref 5–40)

## 2024-08-10 LAB — CBC WITH DIFFERENTIAL/PLATELET
Basophils Absolute: 0.1 x10E3/uL (ref 0.0–0.2)
Basos: 1 %
EOS (ABSOLUTE): 0.2 x10E3/uL (ref 0.0–0.4)
Eos: 3 %
Hematocrit: 40.4 % (ref 34.0–46.6)
Hemoglobin: 12.8 g/dL (ref 11.1–15.9)
Immature Grans (Abs): 0 x10E3/uL (ref 0.0–0.1)
Immature Granulocytes: 0 %
Lymphocytes Absolute: 2.3 x10E3/uL (ref 0.7–3.1)
Lymphs: 42 %
MCH: 30.5 pg (ref 26.6–33.0)
MCHC: 31.7 g/dL (ref 31.5–35.7)
MCV: 96 fL (ref 79–97)
Monocytes Absolute: 0.4 x10E3/uL (ref 0.1–0.9)
Monocytes: 8 %
Neutrophils Absolute: 2.5 x10E3/uL (ref 1.4–7.0)
Neutrophils: 46 %
Platelets: 330 x10E3/uL (ref 150–450)
RBC: 4.19 x10E6/uL (ref 3.77–5.28)
RDW: 12.6 % (ref 11.7–15.4)
WBC: 5.4 x10E3/uL (ref 3.4–10.8)

## 2024-08-10 LAB — BMP8+EGFR
BUN/Creatinine Ratio: 12 (ref 9–23)
BUN: 13 mg/dL (ref 6–24)
CO2: 19 mmol/L — ABNORMAL LOW (ref 20–29)
Calcium: 9.3 mg/dL (ref 8.7–10.2)
Chloride: 111 mmol/L — ABNORMAL HIGH (ref 96–106)
Creatinine, Ser: 1.09 mg/dL — ABNORMAL HIGH (ref 0.57–1.00)
Glucose: 72 mg/dL (ref 70–99)
Potassium: 3.9 mmol/L (ref 3.5–5.2)
Sodium: 145 mmol/L — ABNORMAL HIGH (ref 134–144)
eGFR: 59 mL/min/1.73 — ABNORMAL LOW (ref >59)

## 2024-08-10 MED ORDER — GLIPIZIDE ER 2.5 MG PO TB24: 2.5000 mg | ORAL_TABLET | Freq: Every day | ORAL | 1 refills | Status: AC

## 2024-08-17 ENCOUNTER — Other Ambulatory Visit: Payer: Self-pay | Admitting: Family Medicine

## 2024-08-17 DIAGNOSIS — M47812 Spondylosis without myelopathy or radiculopathy, cervical region: Secondary | ICD-10-CM

## 2024-08-19 ENCOUNTER — Ambulatory Visit (INDEPENDENT_AMBULATORY_CARE_PROVIDER_SITE_OTHER)

## 2024-08-19 DIAGNOSIS — I10 Essential (primary) hypertension: Secondary | ICD-10-CM

## 2024-08-19 NOTE — Progress Notes (Signed)
 Patient is in office today for a nurse visit for Blood Pressure Check. Patients blood pressure was 178/86, Pt states she is taking her blood pressure medicine like she is supposed too

## 2024-10-05 ENCOUNTER — Other Ambulatory Visit: Payer: Self-pay | Admitting: Family Medicine

## 2024-10-24 ENCOUNTER — Ambulatory Visit (HOSPITAL_COMMUNITY)

## 2024-10-26 ENCOUNTER — Ambulatory Visit: Admitting: Orthopedic Surgery

## 2024-10-26 VITALS — BP 139/83 | HR 111 | Ht 61.0 in | Wt 166.4 lb

## 2024-10-26 DIAGNOSIS — M62838 Other muscle spasm: Secondary | ICD-10-CM

## 2024-10-26 MED ORDER — METHOCARBAMOL 500 MG PO TABS: 500.0000 mg | ORAL_TABLET | Freq: Three times a day (TID) | ORAL | 1 refills | Status: AC | PRN

## 2024-10-26 NOTE — Patient Instructions (Signed)
 Cervical Strain and Sprain Rehab You have pain and stiffness in your neck.  The muscles around your neck are irritated.  Recommend using heat (heating pad, or hot water in the shower) to warm up the affected muscles.  Then proceed with stretching and strengthening.  Do not stretch until it hurts, but you should feel a pull.  With each exercise, you should be able to stretch a little bit further.  Attempting these exercises daily, or on a regular basis, can improve your symptoms over time.  Do not expect immediate, sustained improvement.  But, it will make your symptoms better over time.   Ask your health care provider which exercises are safe for you. Do exercises exactly as told by your health care provider and adjust them as directed. It is normal to feel mild stretching, pulling, tightness, or discomfort as you do these exercises. Stop right away if you feel sudden pain or your pain gets worse. Do not begin these exercises until told by your health care provider. Stretching and range-of-motion exercises Cervical side bending  Using good posture, sit on a stable chair or stand up. Without moving your shoulders, slowly tilt your left / right ear to your shoulder until you feel a stretch in the opposite side neck muscles. You should be looking straight ahead. Hold for 10 seconds. Repeat with the other side of your neck. Repeat 10 times. Complete this exercise 1-2 times a day. Cervical rotation  Using good posture, sit on a stable chair or stand up. Slowly turn your head to the side as if you are looking over your left / right shoulder. Keep your eyes level with the ground. Stop when you feel a stretch along the side and the back of your neck. Hold for 10 seconds. Repeat this by turning to your other side. Repeat 10 times. Complete this exercise 1-2 times a day. Thoracic extension and pectoral stretch Roll a towel or a small blanket so it is about 4 inches (10 cm) in diameter. Lie down on  your back on a firm surface. Put the towel lengthwise, under your spine in the middle of your back. It should not be under your shoulder blades. The towel should line up with your spine from your middle back to your lower back. Put your hands behind your head and let your elbows fall out to your sides. Hold for 10 seconds. Repeat 10 times. Complete this exercise 1-2 times a day. Strengthening exercises Isometric upper cervical flexion Lie on your back with a thin pillow behind your head and a small rolled-up towel under your neck. Gently tuck your chin toward your chest and nod your head down to look toward your feet. Do not lift your head off the pillow. Hold for 10 seconds. Release the tension slowly. Relax your neck muscles completely before you repeat this exercise. Repeat 10 times. Complete this exercise 1-2 times a day. Isometric cervical extension  Stand about 6 inches (15 cm) away from a wall, with your back facing the wall. Place a soft object, about 6-8 inches (15-20 cm) in diameter, between the back of your head and the wall. A soft object could be a small pillow, a ball, or a folded towel. Gently tilt your head back and press into the soft object. Keep your jaw and forehead relaxed. Hold for 10 seconds. Release the tension slowly. Relax your neck muscles completely before you repeat this exercise. Repeat 10 times. Complete this exercise 1-2 times a day. Posture  and body mechanics Body mechanics refers to the movements and positions of your body while you do your daily activities. Posture is part of body mechanics. Good posture and healthy body mechanics can help to relieve stress in your body's tissues and joints. Good posture means that your spine is in its natural S-curve position (your spine is neutral), your shoulders are pulled back slightly, and your head is not tipped forward. The following are general guidelines for applying improved posture and body mechanics to your  everyday activities. Sitting  When sitting, keep your spine neutral and keep your feet flat on the floor. Use a footrest, if necessary, and keep your thighs parallel to the floor. Avoid rounding your shoulders, and avoid tilting your head forward. When working at a desk or a computer, keep your desk at a height where your hands are slightly lower than your elbows. Slide your chair under your desk so you are close enough to maintain good posture. When working at a computer, place your monitor at a height where you are looking straight ahead and you do not have to tilt your head forward or downward to look at the screen. Standing  When standing, keep your spine neutral and keep your feet about hip-width apart. Keep a slight bend in your knees. Your ears, shoulders, and hips should line up. When you do a task in which you stand in one place for a long time, place one foot up on a stable object that is 2-4 inches (5-10 cm) high, such as a footstool. This helps keep your spine neutral. Resting When lying down and resting, avoid positions that are most painful for you. Try to support your neck in a neutral position. You can use a contour pillow or a small rolled-up towel. Your pillow should support your neck but not push on it. This information is not intended to replace advice given to you by your health care provider. Make sure you discuss any questions you have with your health care provider. Document Revised: 03/16/2019 Document Reviewed: 08/25/2018 Elsevier Patient Education  2022 [employer].

## 2024-10-27 ENCOUNTER — Encounter: Payer: Self-pay | Admitting: Orthopedic Surgery

## 2024-10-27 NOTE — Progress Notes (Signed)
 New Patient Visit  Assessment: Kayla Choi is a 58 y.o. female with the following: 1. Neck muscle spasm  Plan: Kayla Choi has pain in the left side of her neck.  This is consistent with a muscle spasm.  This was discussed with the patient in clinic today.  Radiographs are without concern.  She states that the current muscle relaxer she is using is not helping.  We discussed switching medications.  She is in agreement.  She already has physical therapy scheduled.  I do think therapy will be helpful for her.  In the meantime, she should continue to stretch the neck, and I provided her with some home exercises.  Follow-up as needed.  Follow-up: Return if symptoms worsen or fail to improve.  Subjective:  Chief Complaint  Patient presents with   Neck Pain    Neck pain and stiffness has numbness and tingiling in left hand but that has been since mini stroke will be 2 years ago Christmas Day. Hurts to turn to the left side but if a mash a certain spot I can turn it wit less pain    History of Present Illness: Kayla Choi is a 58 y.o. female who has been referred by  Kayla Terry Wilhelmena Lloyd, FNP for evaluation of neck pain.  She states that she has pain in the left side of her neck.  This been ongoing for couple months.  No specific injury.  She does have a history of stroke, almost 2 years ago, but previously had no issues with her neck.  She has been taking medications.  She has been trying muscle relaxer.  She has done exercises on her own.  She has not worked with physical therapy, although there has been a referral placed.  She is scheduled to start therapy in a couple weeks.  She is point tender in the neck.  She has difficulty rotating her head to, but when she applies pressure to the spot in her neck, she is able to rotate her head.  She does have some numbness and tingling in the left hand, but this has been consistent since she sustained a stroke   Review of Systems: No fevers  or chills + numbness or tingling No chest pain No shortness of breath No bowel or bladder dysfunction No GI distress No headaches   Medical History:  Past Medical History:  Diagnosis Date   Acid reflux    ADD (attention deficit disorder)    Anemia    iron deficinecy   Anxiety    with social phobia   Asthma    Balance problem    Chest pain    that occurs with anxiety   COPD (chronic obstructive pulmonary disease) (HCC)    Depression    Diabetes mellitus    Glaucoma    Hyperlipidemia    Hypertension    Normal cardiac stress test 12/09/2015   low risk study   Panic attack    Prediabetes    Recurrent falls    Sleep apnea    Social phobia    Stroke [facility]) 11/2022   ministroke    Past Surgical History:  Procedure Laterality Date   ABDOMINAL HYSTERECTOMY     fibroids, both ovaries left intact   ANKLE SURGERY     BIOPSY  10/14/2023   Procedure: BIOPSY;  Surgeon: Shaaron Lamar HERO, MD;  Location: [facility];  Service: Endoscopy;;   CHOLECYSTECTOMY     COLONOSCOPY N/A 10/18/2019   Procedure: COLONOSCOPY;  Surgeon: Shaaron Lamar HERO, MD;  Location: [facility];  Service: Endoscopy;  Laterality: N/A;  1:00-office rescheduled to 11/10 @ 12:45pm   COLONOSCOPY WITH PROPOFOL  N/A 10/14/2023   Procedure: COLONOSCOPY WITH PROPOFOL ;  Surgeon: Shaaron Lamar HERO, MD;  Location: [facility];  Service: Endoscopy;  Laterality: N/A;  730am, asa 3   COSMETIC SURGERY     elbow   ESOPHAGOGASTRODUODENOSCOPY (EGD) WITH PROPOFOL  N/A 10/14/2023   Procedure: ESOPHAGOGASTRODUODENOSCOPY (EGD) WITH PROPOFOL ;  Surgeon: Shaaron Lamar HERO, MD;  [facility]: [facility];  Service: Endoscopy;  Laterality: N/A;   FRACTURE SURGERY     Recurrent elbow surgery   LIPOMA EXCISION  04/30/2012   Procedure: EXCISION LIPOMA;  Surgeon: Thresa JAYSON Pulling, MD;  Location: [facility];  Service: General;  Laterality: Right;  Excision soft tissue mass right thigh   MALONEY DILATION N/A 10/14/2023   Procedure: AGAPITO DILATION;   Surgeon: Shaaron Lamar HERO, MD;  Location: [facility];  Service: Endoscopy;  Laterality: N/A;   MASS EXCISION Right 12/19/2020   Procedure: EXCISION CYST; 2 CM; AXILLA;  Surgeon: Kallie Manuelita JAYSON, MD;  Location: [facility];  Service: General;  Laterality: Right;   ORIF ANKLE FRACTURE Right 12/15/2017   Procedure: OPEN REDUCTION INTERNAL FIXATION (ORIF) RIGHT ANKLE FRACTURE;  Surgeon: Beverley Evalene BIRCH, MD;  Location: [facility];  Service: Orthopedics;  Laterality: Right;   POLYPECTOMY  10/18/2019   Procedure: POLYPECTOMY;  Surgeon: Shaaron Lamar HERO, MD;  [facility];  Service: Endoscopy;;  colon   POLYPECTOMY  10/14/2023   Procedure: POLYPECTOMY INTESTINAL;  Surgeon: Shaaron Lamar HERO, MD;  [facility]: [facility];  Service: Endoscopy;;   right elbow     reconstruction    Family History  Problem Relation Age of Onset   Hypertension Mother    Diabetes Father    Heart disease Father    Anesthesia problems Neg Hx    Malignant hyperthermia Neg Hx    Hypotension Neg Hx    Pseudochol deficiency Neg Hx    Social History   Tobacco Use   Smoking status: Never   Smokeless tobacco: Never  Vaping Use   Vaping status: Never Used  Substance Use Topics   Alcohol use: No    Alcohol/week: 0.0 standard drinks of alcohol   Drug use: No    Allergies  Allergen Reactions   Aspirin Shortness Of Breath and Itching   Metformin  And Related Anaphylaxis   Other Anaphylaxis    Green peas   Oxycodone  Itching   Diphenhydramine  Hcl Rash   Latex Itching and Rash   Neosporin [Neomycin-Bacitracin  Zn-Polymyx] Swelling, Rash and Other (See Comments)    skin peeling    Current Meds  Medication Sig   acetaminophen  (TYLENOL ) 500 MG tablet Take 500 mg by mouth every 6 (six) hours as needed for mild pain (pain score 1-3).   albuterol  (PROVENTIL ) (2.5 MG/3ML) 0.083% nebulizer solution Take 3 mLs (2.5 mg total) by nebulization every 4 (four) hours as needed for wheezing or shortness of  breath.   albuterol  (VENTOLIN  HFA) 108 (90 Base) MCG/ACT inhaler Inhale 2 puffs into the lungs every 4 (four) hours as needed for wheezing or shortness of breath.   amLODipine  (NORVASC ) 10 MG tablet Take 1 tablet (10 mg total) by mouth daily. Blood pressure medication   Blood Glucose Monitoring Suppl (ACCU-CHEK GUIDE) w/Device KIT 3 (three) times daily. as directed   Blood Glucose Monitoring Suppl DEVI 1 each by Does not apply route  in the morning, at noon, and at bedtime. May substitute to any manufacturer covered by patient's insurance.   clobetasol  cream (TEMOVATE ) 0.05 % Apply 1 Application topically 2 (two) times daily.   clopidogrel  (PLAVIX ) 75 MG tablet Take 1 tablet (75 mg total) by mouth daily. Blood thinner- prevent blood clots   Continuous Glucose Sensor (FREESTYLE LIBRE 3 SENSOR) MISC Place 1 sensor on the skin every 14 days. Use to check glucose continuously   gabapentin  (NEURONTIN ) 300 MG capsule Take 1 capsule (300 mg total) by mouth 2 (two) times daily as needed (For pain).   glipiZIDE  (GLUCOTROL  XL) 2.5 MG 24 hr tablet Take 1 tablet (2.5 mg total) by mouth daily with breakfast.   linaclotide  (LINZESS ) 145 MCG CAPS capsule Take 1 capsule (145 mcg total) by mouth daily before breakfast. Constipation   lisinopril -hydrochlorothiazide  (ZESTORETIC ) 20-12.5 MG tablet Take 1 tablet by mouth daily. Blood pressure medication   methocarbamol  (ROBAXIN ) 500 MG tablet Take 1 tablet (500 mg total) by mouth every 8 (eight) hours as needed for muscle spasms.   pantoprazole  (PROTONIX ) 40 MG tablet Take 1 tablet (40 mg total) by mouth 2 (two) times daily before a meal. Heartburn   pregabalin  (LYRICA ) 25 MG capsule Take 1 capsule (25 mg total) by mouth 2 (two) times daily.   rosuvastatin  (CRESTOR ) 40 MG tablet Take 1 tablet (40 mg total) by mouth daily. Cholesterol medication   sitaGLIPtin  (JANUVIA ) 100 MG tablet Take 1 tablet (100 mg total) by mouth daily. Diabetes medication   topiramate  (TOPAMAX ) 50  MG tablet Take 1 tablet (50 mg total) by mouth 2 (two) times daily. For Migraines   traZODone  (DESYREL ) 50 MG tablet Take 1 tablet (50 mg total) by mouth at bedtime as needed for sleep.    Objective: BP 139/83   Pulse (!) 111   Ht 5' 1 (1.549 m)   Wt 166 lb 6.4 oz (75.5 kg)   BMI 31.44 kg/m   Physical Exam:  General: Alert and oriented. and No acute distress. Gait: Normal gait.  Evaluation of the neck and shoulder demonstrates no deformity.  She has tenderness within the trapezius.  Exquisite tenderness within the left side of the neck muscles.  She has difficulty with rotation.  Limited flexion and extension of her decree sensation to the left thumb, index finger and long finger.  She has good strength and motion of the left upper extremity otherwise.  IMAGING: I personally ordered and reviewed the following images  X-rays of the cervical spine were previously obtained.  Mild degenerative changes overall.  Slight loss of normal curvature.   New Medications:  Meds ordered this encounter  Medications   methocarbamol  (ROBAXIN ) 500 MG tablet    Sig: Take 1 tablet (500 mg total) by mouth every 8 (eight) hours as needed for muscle spasms.    Dispense:  30 tablet    Refill:  1      Oneil DELENA Horde, MD  10/27/2024 11:05 AM

## 2024-11-22 ENCOUNTER — Telehealth (HOSPITAL_COMMUNITY): Payer: Self-pay

## 2024-11-22 ENCOUNTER — Ambulatory Visit (HOSPITAL_COMMUNITY)

## 2024-11-22 LAB — OPHTHALMOLOGY REPORT-SCANNED

## 2024-11-22 NOTE — Telephone Encounter (Signed)
 Pt was called concerning her missed evaluation this morning. Pt states she thought the appointment was the 19th. Pt was given front desk number to call and reschedule at her earliest convenience.   Lang Ada, PT, DPT [facility] Office: [phone] 2:54 PM, 11/22/2024

## 2024-11-22 NOTE — Therapy (Incomplete)
 OUTPATIENT PHYSICAL THERAPY CERVICAL EVALUATION   Patient Name: Kayla Choi MRN: [id number] DOB:[birth year], 58 y.o., female Today's Date: 11/22/2024  END OF SESSION:   Past Medical History:  Diagnosis Date   Acid reflux    ADD (attention deficit disorder)    Anemia    iron deficinecy   Anxiety    with social phobia   Asthma    Balance problem    Chest pain    that occurs with anxiety   COPD (chronic obstructive pulmonary disease) (HCC)    Depression    Diabetes mellitus    Glaucoma    Hyperlipidemia    Hypertension    Normal cardiac stress test 12/09/2015   low risk study   Panic attack    Prediabetes    Recurrent falls    Sleep apnea    Social phobia    Stroke [facility]) 11/2022   ministroke   Past Surgical History:  Procedure Laterality Date   ABDOMINAL HYSTERECTOMY     fibroids, both ovaries left intact   ANKLE SURGERY     BIOPSY  10/14/2023   Procedure: BIOPSY;  Surgeon: Kayla Lamar HERO, MD;  [facility];  Service: Endoscopy;;   CHOLECYSTECTOMY     COLONOSCOPY N/A 10/18/2019   Procedure: COLONOSCOPY;  Surgeon: Kayla Lamar HERO, MD;  [facility];  Service: Endoscopy;  Laterality: N/A;  1:00-office rescheduled to 11/10 @ 12:45pm   COLONOSCOPY WITH PROPOFOL  N/A 10/14/2023   Procedure: COLONOSCOPY WITH PROPOFOL ;  Surgeon: Kayla Lamar HERO, MD;  [facility];  Service: Endoscopy;  Laterality: N/A;  730am, asa 3   COSMETIC SURGERY     elbow   ESOPHAGOGASTRODUODENOSCOPY (EGD) WITH PROPOFOL  N/A 10/14/2023   Procedure: ESOPHAGOGASTRODUODENOSCOPY (EGD) WITH PROPOFOL ;  Surgeon: Kayla Lamar HERO, MD;  [facility];  Service: Endoscopy;  Laterality: N/A;   FRACTURE SURGERY     Recurrent elbow surgery   LIPOMA EXCISION  04/30/2012   Procedure: EXCISION LIPOMA;  Surgeon: Kayla Choi Pulling, MD;  Location: [facility];  Service: General;  Laterality: Right;  Excision soft tissue mass right thigh   MALONEY Choi N/A 10/14/2023   Procedure:  Kayla Choi;  Surgeon: Kayla Lamar HERO, MD;  [facility];  Service: Endoscopy;  Laterality: N/A;   MASS EXCISION Right 12/19/2020   Procedure: EXCISION CYST; 2 CM; AXILLA;  Surgeon: Kayla Manuelita JAYSON, MD;  Location: [facility];  Service: General;  Laterality: Right;   ORIF ANKLE FRACTURE Right 12/15/2017   Procedure: OPEN REDUCTION INTERNAL FIXATION (ORIF) RIGHT ANKLE FRACTURE;  Surgeon: Kayla Evalene BIRCH, MD;  Location: [facility];  Service: Orthopedics;  Laterality: Right;   POLYPECTOMY  10/18/2019   Procedure: POLYPECTOMY;  Surgeon: Kayla Lamar HERO, MD;  [facility];  Service: Endoscopy;;  colon   POLYPECTOMY  10/14/2023   Procedure: POLYPECTOMY INTESTINAL;  Surgeon: Kayla Lamar HERO, MD;  [facility];  Service: Endoscopy;;   right elbow     reconstruction   Patient Active Problem List   Diagnosis Date Noted   Neck pain 08/05/2024   Encounter for routine adult physical exam with abnormal findings 04/29/2024   Sore throat 01/25/2024   Hoarseness 01/25/2024   H. pylori infection 01/25/2024   OSA (obstructive sleep apnea) 09/21/2023   Basal ganglia infarction (HCC) 07/30/2023   Dysesthesia of face 07/30/2023   Numbness and tingling in left hand 07/30/2023   Depression, recurrent 07/27/2023   H/O adenomatous polyp of  colon 07/15/2023   Constipation 07/15/2023   Esophageal dysphagia 07/15/2023   Epigastric abdominal pain 07/15/2023   N&V (nausea and vomiting) 07/15/2023   Poor appetite 07/15/2023   Stroke (HCC) 06/04/2023   Paresthesia 06/04/2023   Snoring 06/04/2023   Cystitis 05/08/2023   Hypotension 05/06/2023   Lightheadedness 05/06/2023   Blurred vision 05/06/2023   Left hand pain 04/16/2023   Unintentional weight loss 04/16/2023   Left sided numbness 12/01/2022   Class 2 obesity 09/02/2017   Recurrent falls 05/13/2016   Sebaceous cyst of right axilla 03/10/2016   Onychomycosis of toenail 02/28/2015   Knee pain 10/06/2014    Neck muscle spasm 07/02/2014   Migraines 05/12/2014   History of keloid of skin 03/28/2014   Back pain 10/23/2013   Headache 10/05/2012   Inadequate social support 06/25/2012   Microalbuminuria 06/25/2012   Diabetes mellitus, type II (HCC) 04/02/2012   Keloid scar of skin 09/03/2011   COPD (chronic obstructive pulmonary disease) (HCC) 05/21/2007   Hyperlipidemia 01/29/2007   Anxiety state 11/02/2006   MDD (major depressive disorder) 11/02/2006   Essential hypertension 11/02/2006    PCP: Kayla Wilhelmena Lloyd Hilario, FNP  REFERRING PROVIDER: Terry Wilhelmena Lloyd Hilario, FNP  REFERRING DIAG: [phone] (ICD-10-CM) - Cervical spondylosis  THERAPY DIAG:  No diagnosis found.  Rationale for Evaluation and Treatment: Rehabilitation  ONSET DATE: ***  SUBJECTIVE:                                                                                                                                                                                                         SUBJECTIVE STATEMENT: *** Hand dominance: {MISC; OT HAND DOMINANCE:[phone]}  PERTINENT HISTORY:  ***  PAIN:  Are you having pain? {OPRCPAIN:27236}  PRECAUTIONS: {Therapy precautions:24002}  RED FLAGS: {PT Red Flags:29287}     WEIGHT BEARING RESTRICTIONS: {Yes ***/No:24003}  FALLS:  Has patient fallen in last 6 months? {fallsyesno:27318}  LIVING ENVIRONMENT: Lives with: {OPRC lives with:25569::lives with their family} Lives in: {Lives in:25570} Stairs: {opstairs:27293} Has following equipment at home: {Assistive devices:23999}  OCCUPATION: ***  PLOF: {PLOF:24004}  PATIENT GOALS: ***  NEXT MD VISIT: ***  OBJECTIVE:  Note: Objective measures were completed at Evaluation unless otherwise noted.  DIAGNOSTIC FINDINGS:  CLINICAL DATA:  Neck pain   EXAM: CERVICAL SPINE - COMPLETE 4+ VIEW   COMPARISON:  CT 07/04/2009   FINDINGS: Straightening of the cervical spine. Vertebral body heights are maintained.  Normal prevertebral soft tissue thickness. Dens and lateral masses are within normal limits. Mild diffuse disc space narrowing C3 through C7. Assessment of  the foramen on the right limited due to positioning, possible left foraminal narrowing at C3-C4   IMPRESSION: Straightening of the cervical spine with mild diffuse degenerative changes.  PATIENT SURVEYS:  NDI: {:PHR,OPRCNDI}  COGNITION: Overall cognitive status: {cognition:24006}  SENSATION: {sensation:27233}  POSTURE: {posture:25561}  PALPATION: ***   CERVICAL ROM:   {AROM/PROM:27142} ROM A/PROM (deg) eval  Flexion   Extension   Right lateral flexion   Left lateral flexion   Right rotation   Left rotation    (Blank rows = not tested)  UPPER EXTREMITY ROM:  {AROM/PROM:27142} ROM Right eval Left eval  Shoulder flexion    Shoulder extension    Shoulder abduction    Shoulder adduction    Shoulder extension    Shoulder internal rotation    Shoulder external rotation    Elbow flexion    Elbow extension    Wrist flexion    Wrist extension    Wrist ulnar deviation    Wrist radial deviation    Wrist pronation    Wrist supination     (Blank rows = not tested)  UPPER EXTREMITY MMT:  MMT Right eval Left eval  Shoulder flexion    Shoulder extension    Shoulder abduction    Shoulder adduction    Shoulder extension    Shoulder internal rotation    Shoulder external rotation    Middle trapezius    Lower trapezius    Elbow flexion    Elbow extension    Wrist flexion    Wrist extension    Wrist ulnar deviation    Wrist radial deviation    Wrist pronation    Wrist supination    Grip strength     (Blank rows = not tested)  CERVICAL SPECIAL TESTS:  {Cervical special tests:25246}  FUNCTIONAL TESTS:  {Functional tests:24029}  TREATMENT DATE:  11/22/2024   Evaluation: -ROM measured, Strength assessed, HEP prescribed, pt educated on prognosis, findings, and importance of HEP compliance if  given.                                                                                                                                      PATIENT EDUCATION:  Education details: Pt was educated on findings of PT evaluation, prognosis, frequency of therapy visits and rationale, attendance policy, and HEP if given.   Person educated: {Person educated:25204} Education method: {Education Method:25205} Education comprehension: {Education Comprehension:25206}  HOME EXERCISE PROGRAM: ***  ASSESSMENT:  CLINICAL IMPRESSION: Patient is a 58 y.o. female who was seen today for physical therapy evaluation and treatment for M47.812 (ICD-10-CM) - Cervical spondylosis.   Patient demonstrates decreased cervical spine ROM, increased pain in neck, and increased tension in bilateral upper trapezius and paraspinals of cervical spine. Patient also suffering from increased frequency and intensity of headaches for the past 2 months. Patient also demonstrates tenderness to upper thoracic spinal segments to about T3 vertebrae. Patient requires moderate  verbal cuing for relaxation of cervical spine musculature with difficulty. Patient would benefit from skilled physical therapy for decreased headache frequency and intensity, increased strength in paraspinal and other postural musculature, and improved posture for improved ability to work without symptom reproduction, return to higher level of function with ADLs, and progress towards therapy goals.   OBJECTIVE IMPAIRMENTS: {opptimpairments:25111}.   ACTIVITY LIMITATIONS: {activitylimitations:27494}  PARTICIPATION LIMITATIONS: {participationrestrictions:25113}  PERSONAL FACTORS: {Personal factors:25162} are also affecting patient's functional outcome.   REHAB POTENTIAL: {rehabpotential:25112}  CLINICAL DECISION MAKING: {clinical decision making:25114}  EVALUATION COMPLEXITY: {Evaluation complexity:25115}   GOALS: Goals reviewed with patient?  {yes/no:20286}  SHORT TERM GOALS: Target date: ***  Pt will be independent with HEP in order to demonstrate participation in Physical Therapy POC.  Baseline: Goal status: {GOALSTATUS:25110}  2.  Pt will report ***/10 pain with cervical mobility in order to demonstrate improved pain with ADLs.  Baseline:  Goal status: {GOALSTATUS:25110}  LONG TERM GOALS: Target date: ***  Pt will report decreased cervicogenic caused headaches to less than *** per *** in order to demonstrate improved quality of life.  Baseline: see objective.  Goal status: {GOALSTATUS:25110}  2.  Pt will improve cervical ROM (flex/ext/lateral flexion/rotation) by *** in order to demonstrate improved functional ambulatory capacity in community setting.  Baseline: see objective.  Goal status: {GOALSTATUS:25110}  3.  Pt will improve NDI score by at least 11.75 points in order to demonstrate decreased pain with functional goals and outcomes. Baseline: see objective.  Goal status: {GOALSTATUS:25110}  4.  Pt will report ***/10 pain with cervical mobility in order to demonstrate reduced pain with ADLs that require use of cervical spine musculature (driving, washing hair, reaching to elevated cabinet).  Baseline: see objective.  Goal status: {GOALSTATUS:25110}     PLAN:  PT FREQUENCY: {rehab frequency:25116}  PT DURATION: {rehab duration:25117}  PLANNED INTERVENTIONS: {rehab planned interventions:25118::97110-Therapeutic exercises,97530- Therapeutic [phone]- Neuromuscular re-education,97535- Self Rjmz,02859- Manual therapy,Patient/Family education}  PLAN FOR NEXT SESSION: ***   Lang Ada, PT, DPT [facility] Office: [phone] 8:51 AM, 11/22/2024

## 2024-11-28 ENCOUNTER — Ambulatory Visit (HOSPITAL_COMMUNITY)

## 2024-12-04 ENCOUNTER — Emergency Department (HOSPITAL_COMMUNITY)

## 2024-12-04 ENCOUNTER — Emergency Department (HOSPITAL_COMMUNITY)
Admission: EM | Admit: 2024-12-04 | Discharge: 2024-12-05 | Disposition: A | Discharge status: Home / Self Care | Attending: Emergency Medicine | Admitting: Emergency Medicine

## 2024-12-04 ENCOUNTER — Other Ambulatory Visit: Payer: Self-pay

## 2024-12-04 ENCOUNTER — Encounter (HOSPITAL_COMMUNITY): Payer: Self-pay

## 2024-12-04 DIAGNOSIS — R109 Unspecified abdominal pain: Secondary | ICD-10-CM | POA: Diagnosis not present

## 2024-12-04 DIAGNOSIS — Z7902 Long term (current) use of antithrombotics/antiplatelets: Secondary | ICD-10-CM | POA: Insufficient documentation

## 2024-12-04 DIAGNOSIS — Z8673 Personal history of transient ischemic attack (TIA), and cerebral infarction without residual deficits: Secondary | ICD-10-CM | POA: Insufficient documentation

## 2024-12-04 DIAGNOSIS — R7989 Other specified abnormal findings of blood chemistry: Secondary | ICD-10-CM | POA: Insufficient documentation

## 2024-12-04 DIAGNOSIS — J449 Chronic obstructive pulmonary disease, unspecified: Secondary | ICD-10-CM | POA: Insufficient documentation

## 2024-12-04 DIAGNOSIS — B349 Viral infection, unspecified: Secondary | ICD-10-CM | POA: Insufficient documentation

## 2024-12-04 DIAGNOSIS — Z7984 Long term (current) use of oral hypoglycemic drugs: Secondary | ICD-10-CM | POA: Insufficient documentation

## 2024-12-04 DIAGNOSIS — E119 Type 2 diabetes mellitus without complications: Secondary | ICD-10-CM | POA: Insufficient documentation

## 2024-12-04 DIAGNOSIS — Z91040 Latex allergy status: Secondary | ICD-10-CM | POA: Insufficient documentation

## 2024-12-04 DIAGNOSIS — I3139 Other pericardial effusion (noninflammatory): Secondary | ICD-10-CM | POA: Diagnosis not present

## 2024-12-04 DIAGNOSIS — N281 Cyst of kidney, acquired: Secondary | ICD-10-CM | POA: Diagnosis not present

## 2024-12-04 DIAGNOSIS — Z79899 Other long term (current) drug therapy: Secondary | ICD-10-CM | POA: Diagnosis not present

## 2024-12-04 DIAGNOSIS — I10 Essential (primary) hypertension: Secondary | ICD-10-CM | POA: Insufficient documentation

## 2024-12-04 DIAGNOSIS — R932 Abnormal findings on diagnostic imaging of liver and biliary tract: Secondary | ICD-10-CM | POA: Diagnosis not present

## 2024-12-04 DIAGNOSIS — K439 Ventral hernia without obstruction or gangrene: Secondary | ICD-10-CM | POA: Diagnosis not present

## 2024-12-04 DIAGNOSIS — R509 Fever, unspecified: Secondary | ICD-10-CM | POA: Diagnosis present

## 2024-12-04 DIAGNOSIS — J441 Chronic obstructive pulmonary disease with (acute) exacerbation: Secondary | ICD-10-CM

## 2024-12-04 LAB — COMPREHENSIVE METABOLIC PANEL WITH GFR
ALT: 49 U/L — ABNORMAL HIGH (ref 0–44)
AST: 44 U/L — ABNORMAL HIGH (ref 15–41)
Albumin: 4.5 g/dL (ref 3.5–5.0)
Alkaline Phosphatase: 56 U/L (ref 38–126)
Anion gap: 17 — ABNORMAL HIGH (ref 5–15)
BUN: 11 mg/dL (ref 6–20)
CO2: 21 mmol/L — ABNORMAL LOW (ref 22–32)
Calcium: 9.5 mg/dL (ref 8.9–10.3)
Chloride: 99 mmol/L (ref 98–111)
Creatinine, Ser: 0.88 mg/dL (ref 0.44–1.00)
GFR, Estimated: >60 mL/min
Glucose, Bld: 138 mg/dL — ABNORMAL HIGH (ref 70–99)
Potassium: 4.2 mmol/L (ref 3.5–5.1)
Sodium: 137 mmol/L (ref 135–145)
Total Bilirubin: 1.3 mg/dL — ABNORMAL HIGH (ref 0.0–1.2)
Total Protein: 7.9 g/dL (ref 6.5–8.1)

## 2024-12-04 LAB — URINALYSIS, ROUTINE W REFLEX MICROSCOPIC
Bacteria, UA: NONE SEEN
Bilirubin Urine: NEGATIVE
Glucose, UA: NEGATIVE mg/dL
Ketones, ur: 20 mg/dL — AB
Leukocytes,Ua: NEGATIVE
Nitrite: NEGATIVE
Protein, ur: 100 mg/dL — AB
Specific Gravity, Urine: 1.021 (ref 1.005–1.030)
pH: 5 (ref 5.0–8.0)

## 2024-12-04 LAB — CBC
HCT: 40.7 % (ref 36.0–46.0)
Hemoglobin: 13.3 g/dL (ref 12.0–15.0)
MCH: 30.2 pg (ref 26.0–34.0)
MCHC: 32.7 g/dL (ref 30.0–36.0)
MCV: 92.5 fL (ref 80.0–100.0)
Platelets: 320 K/uL (ref 150–400)
RBC: 4.4 MIL/uL (ref 3.87–5.11)
RDW: 12.6 % (ref 11.5–15.5)
WBC: 6.3 K/uL (ref 4.0–10.5)
nRBC: 0 % (ref 0.0–0.2)

## 2024-12-04 LAB — RESP PANEL BY RT-PCR (RSV, FLU A&B, COVID)  RVPGX2
Influenza A by PCR: NEGATIVE
Influenza B by PCR: NEGATIVE
Resp Syncytial Virus by PCR: NEGATIVE
SARS Coronavirus 2 by RT PCR: NEGATIVE

## 2024-12-04 LAB — LIPASE, BLOOD: Lipase: 30 U/L (ref 11–51)

## 2024-12-04 LAB — TROPONIN T, HIGH SENSITIVITY: Troponin T High Sensitivity: <15 ng/L (ref 0–19)

## 2024-12-04 LAB — D-DIMER, QUANTITATIVE: D-Dimer, Quant: 1.18 ug{FEU}/mL — ABNORMAL HIGH (ref 0.00–0.50)

## 2024-12-04 MED ORDER — ONDANSETRON 4 MG PO TBDP
4.0000 mg | ORAL_TABLET | Freq: Once | ORAL | Status: AC | PRN
  Administered 2024-12-04: 4 mg via ORAL
  Filled 2024-12-04: qty 1

## 2024-12-04 MED ORDER — LACTATED RINGERS IV BOLUS
1000.0000 mL | Freq: Once | INTRAVENOUS | Status: AC
  Administered 2024-12-04: 1000 mL via INTRAVENOUS

## 2024-12-04 MED ORDER — MORPHINE SULFATE (PF) 4 MG/ML IV SOLN
4.0000 mg | Freq: Once | INTRAVENOUS | Status: AC
  Administered 2024-12-04: 4 mg via INTRAVENOUS
  Filled 2024-12-04: qty 1

## 2024-12-04 MED ORDER — ALBUTEROL SULFATE (2.5 MG/3ML) 0.083% IN NEBU: 2.5000 mg | INHALATION_SOLUTION | RESPIRATORY_TRACT | 5 refills | Status: AC | PRN

## 2024-12-04 MED ORDER — IOHEXOL 350 MG/ML SOLN
100.0000 mL | Freq: Once | INTRAVENOUS | Status: AC | PRN
  Administered 2024-12-04: 100 mL via INTRAVENOUS

## 2024-12-04 MED ORDER — ONDANSETRON HCL 4 MG PO TABS: 4.0000 mg | ORAL_TABLET | Freq: Three times a day (TID) | ORAL | 0 refills | Status: AC | PRN

## 2024-12-04 MED ORDER — ONDANSETRON HCL 4 MG/2ML IJ SOLN
4.0000 mg | Freq: Once | INTRAMUSCULAR | Status: AC
  Administered 2024-12-04: 4 mg via INTRAVENOUS
  Filled 2024-12-04: qty 2

## 2024-12-04 NOTE — ED Notes (Signed)
 Surgery called at this time Mavis returned call. Sedalia Greeson

## 2024-12-04 NOTE — Discharge Instructions (Addendum)
 Thank you for visiting the Emergency Department today. It was a pleasure to be part of your healthcare team.   Your were seen today for flu-like symptoms, and your workup was overall reassuring, however, you are to follow-up with you PCP at your scheduled appointment on Friday for re-assessment. As discussed, rest, hydrate, and resume normal diet as tolerated.  It is important to watch for warning signs such as worsening pain, fever, trouble breathing, or chest pain. If any of these happen, return to the Emergency Department or call 911.  Thank you for trusting us  with your health.

## 2024-12-04 NOTE — ED Provider Notes (Signed)
 " [place] EMERGENCY DEPARTMENT AT [facility] Provider Note   CSN: [id number] Arrival date & time: 12/04/24  1606     Patient presents with: Generalized Body Aches   Kayla Choi is a 58 y.o. female with a history of COPD, hypertension, asthma, CVA who presents with 1 day of flulike symptoms including fever, chills, myalgias, headache, nasal congestion, sore throat, and nonproductive cough.  The patient reports associated fatigue and decreased appetite, with epigastric abdominal pain, intermittent nausea, but denies persistent vomiting or diarrhea.  Symptoms began gradually and have progressively worsened despite supportive care at home including antipyretics and over-the-counter cold/flu medication.  The patient was tachycardic on arrival however denies chest pain, shortness of breath, hemoptysis, syncope, confusion, neck stiffness, or focal neurological deficits. The patient is in no acute distress.    HPI     Prior to Admission medications  Medication Sig Start Date End Date Taking? Authorizing Provider  acetaminophen  (TYLENOL ) 500 MG tablet Take 500 mg by mouth every 6 (six) hours as needed for mild pain (pain score 1-3).    [provider]  albuterol  (PROVENTIL ) (2.5 MG/3ML) 0.083% nebulizer solution Take 3 mLs (2.5 mg total) by nebulization every 4 (four) hours as needed for wheezing or shortness of breath. 12/04/24   Willma Duwaine CROME, PA  albuterol  (VENTOLIN  HFA) 108 (90 Base) MCG/ACT inhaler Inhale 2 puffs into the lungs every 4 (four) hours as needed for wheezing or shortness of breath. 04/29/24   Del Wilhelmena Falter, Hilario, FNP  amLODipine  (NORVASC ) 10 MG tablet Take 1 tablet (10 mg total) by mouth daily. Blood pressure medication 04/29/24   Del Wilhelmena Falter Hilario, FNP  Blood Glucose Monitoring Suppl (ACCU-CHEK GUIDE) w/Device KIT 3 (three) times daily. as directed 02/01/24   [provider]  Blood Glucose Monitoring Suppl DEVI 1 each by Does not apply  route in the morning, at noon, and at bedtime. May substitute to any manufacturer covered by patient's insurance. 02/01/24   Del Orbe Polanco, Iliana, FNP  Cholecalciferol (VITAMIN D3) 50 MCG (2000 UT) capsule Take 1 capsule (2,000 Units total) by mouth daily. Patient not taking: Reported on 10/26/2024 04/29/24   Del Orbe Polanco, Iliana, FNP  clobetasol  cream (TEMOVATE ) 0.05 % Apply 1 Application topically 2 (two) times daily. 04/16/23   Del Wilhelmena Falter Hilario, FNP  clopidogrel  (PLAVIX ) 75 MG tablet Take 1 tablet (75 mg total) by mouth daily. Blood thinner- prevent blood clots 10/05/24   Del Wilhelmena Falter, [place], FNP  Continuous Glucose Sensor (FREESTYLE LIBRE 3 SENSOR) MISC Place 1 sensor on the skin every 14 days. Use to check glucose continuously 05/12/24   Del Wilhelmena Falter, Iliana, FNP  gabapentin  (NEURONTIN ) 300 MG capsule Take 1 capsule (300 mg total) by mouth 2 (two) times daily as needed (For pain). 12/09/24   Del Orbe Polanco, Iliana, FNP  glipiZIDE  (GLUCOTROL  XL) 2.5 MG 24 hr tablet Take 1 tablet (2.5 mg total) by mouth daily with breakfast. 08/10/24   Del Orbe Polanco, Iliana, FNP  linaclotide  (LINZESS ) 145 MCG CAPS capsule Take 1 capsule (145 mcg total) by mouth daily before breakfast. Constipation 04/29/24   Del Orbe Polanco, Iliana, FNP  lisinopril -hydrochlorothiazide  (ZESTORETIC ) 20-12.5 MG tablet Take 1 tablet by mouth daily. Blood pressure medication 04/29/24   Del Wilhelmena Falter, Iliana, FNP  methocarbamol  (ROBAXIN ) 500 MG tablet Take 1 tablet (500 mg total) by mouth every 8 (eight) hours as needed for muscle spasms. 10/26/24   Onesimo Oneil LABOR, MD  pantoprazole  (PROTONIX )  40 MG tablet Take 1 tablet (40 mg total) by mouth 2 (two) times daily before a meal. Heartburn 04/29/24   Del Wilhelmena Exzavier Ruderman Sola, FNP  pregabalin  (LYRICA ) 25 MG capsule Take 1 capsule (25 mg total) by mouth 2 (two) times daily. 12/10/23 10/26/24  Lamount Ethan CROME, DPM  promethazine -dextromethorphan  (PROMETHAZINE -DM) 6.25-15 MG/5ML  syrup Take 5 mLs by mouth 4 (four) times daily as needed. 12/09/24   Bacchus, Meade PEDLAR, FNP  rosuvastatin  (CRESTOR ) 40 MG tablet Take 1 tablet (40 mg total) by mouth daily. Cholesterol medication 04/29/24   Del Orbe Polanco, Iliana, FNP  sitaGLIPtin  (JANUVIA ) 100 MG tablet Take 1 tablet (100 mg total) by mouth daily. Diabetes medication 04/29/24   Del Orbe Polanco, Iliana, FNP  topiramate  (TOPAMAX ) 50 MG tablet Take 1 tablet (50 mg total) by mouth 2 (two) times daily. For Migraines 04/29/24   Del Wilhelmena Roxanne Panek Sola, FNP  traZODone  (DESYREL ) 50 MG tablet Take 1 tablet (50 mg total) by mouth at bedtime as needed for sleep. 04/29/24   Del Wilhelmena Syniah Berne Sola, FNP    Allergies: Aspirin, Metformin  and related, Other, Oxycodone , Diphenhydramine  hcl, Latex, and Neosporin [neomycin-bacitracin  zn-polymyx]    Review of Systems  Updated Vital Signs BP (!) 187/85   Pulse 94   Temp 99.1 F (37.3 C) (Oral)   Resp 17   Ht 5' 1 (1.549 m)   Wt 75.8 kg   SpO2 92%   BMI 31.55 kg/m   Physical Exam Vitals and nursing note reviewed.  Constitutional:      General: She is awake. She is not in acute distress.    Appearance: Normal appearance. She is not toxic-appearing.  HENT:     Head: Normocephalic and atraumatic.     Right Ear: Hearing and ear canal normal.     Left Ear: Hearing and ear canal normal.     Ears:     Comments: TM mildly erythematous bilaterally without bulging, effusion, purulence, or loss of landmarks.     Nose: Congestion and rhinorrhea present. Rhinorrhea is clear.     Right Sinus: Maxillary sinus tenderness present.     Left Sinus: Maxillary sinus tenderness present.     Mouth/Throat:     Mouth: Mucous membranes are moist.     Pharynx: Uvula midline. Posterior oropharyngeal erythema present. No pharyngeal swelling, oropharyngeal exudate or uvula swelling.     Tonsils: No tonsillar exudate or tonsillar abscesses.  Eyes:     General: Lids are normal. Vision grossly intact.      Extraocular Movements: Extraocular movements intact.     Conjunctiva/sclera: Conjunctivae normal.     Pupils: Pupils are equal, round, and reactive to light.  Cardiovascular:     Rate and Rhythm: Normal rate and regular rhythm.     Pulses:          Radial pulses are 2+ on the right side.  Pulmonary:     Effort: Pulmonary effort is normal.     Breath sounds: Normal breath sounds. No wheezing.     Comments: Patient has no difficulty speaking in complete sentences.  Abdominal:     General: Abdomen is flat.     Palpations: Abdomen is soft.     Tenderness: There is abdominal tenderness in the epigastric area.     Comments: Patient endorses moderate pain to palpation to epigastric region.  Denies CVA tenderness, no guarding, no rebound, or distention.  Musculoskeletal:     Cervical back: Full passive range of motion without pain  and neck supple. No rigidity. No spinous process tenderness.  Lymphadenopathy:     Cervical: Cervical adenopathy present.  Skin:    General: Skin is warm and dry.     Capillary Refill: Capillary refill takes less than 2 seconds.     Findings: No rash.  Neurological:     General: No focal deficit present.     Mental Status: She is alert.  Psychiatric:        Attention and Perception: Attention normal.        Mood and Affect: Mood normal.        Speech: Speech normal.     (all labs ordered are listed, but only abnormal results are displayed) Labs Reviewed  COMPREHENSIVE METABOLIC PANEL WITH GFR - Abnormal; Notable for the following components:      Result Value   CO2 21 (*)    Glucose, Bld 138 (*)    AST 44 (*)    ALT 49 (*)    Total Bilirubin 1.3 (*)    Anion gap 17 (*)    All other components within normal limits  URINALYSIS, ROUTINE W REFLEX MICROSCOPIC - Abnormal; Notable for the following components:   Hgb urine dipstick SMALL (*)    Ketones, ur 20 (*)    Protein, ur 100 (*)    All other components within normal limits  D-DIMER, QUANTITATIVE -  Abnormal; Notable for the following components:   D-Dimer, Quant 1.18 (*)    All other components within normal limits  RESP PANEL BY RT-PCR (RSV, FLU A&B, COVID)  RVPGX2  LIPASE, BLOOD  CBC  TROPONIN T, HIGH SENSITIVITY    EKG: EKG Interpretation Date/Time:  Sunday December 04 2024 16:15:53 EST Ventricular Rate:  117 PR Interval:  120 QRS Duration:  106 QT Interval:  334 QTC Calculation: 465 R Axis:   79  Text Interpretation: Sinus tachycardia Nonspecific ST abnormality Abnormal ECG No significant change since last tracing Confirmed by Ellouise Fine ([id number]) on 12/05/2024 3:24:53 PM  Radiology: No results found.   Procedures   Medications Ordered in the ED  ondansetron  (ZOFRAN -ODT) disintegrating tablet 4 mg (4 mg Oral Given 12/04/24 1619)  morphine  (PF) 4 MG/ML injection 4 mg (4 mg Intravenous Given 12/04/24 1951)  ondansetron  (ZOFRAN ) injection 4 mg (4 mg Intravenous Given 12/04/24 1951)  lactated ringers  bolus 1,000 mL (0 mLs Intravenous Stopping previously hung infusion 12/04/24 2323)  iohexol  (OMNIPAQUE ) 350 MG/ML injection 100 mL (100 mLs Intravenous Contrast Given 12/04/24 2032)                                 Medical Decision Making Amount and/or Complexity of Data Reviewed Labs: ordered.  Risk Prescription drug management.   Patient presents to the ED for: Flulike symptoms This involves an extensive number of treatment options  Differential diagnosis includes: Infectious etiology Chronic, exacerbation -COPD PE Co-morbid conditions: COPD, hypertension, diabetes, asthma, CVA  Additional history/records obtained and reviewed: Additional history obtained from  family -patient's son, who is present for visit, presents as good historian.  Clinical Course as of 12/12/24 0445  Sun Dec 04, 2024  2107 Pulse Rate: 100 Afebrile, tachycardic, patient in no acute distress [ML]  2109 Comprehensive metabolic panel(!) No acute findings, mildly elevated LFTs [ML]   2109 CBC WNL [ML]  2109 Lipase, blood WNL [ML]  2109 Troponin T, High Sensitivity Negative [ML]  2109 D-dimer, quantitative(!) 1.18 [ML]  2109 Urinalysis, Routine  w reflex microscopic -Urine, Clean Catch(!) Unremarkable [ML]  2109 Patient given morphine , ondansetron , lactated ringer  bolus for symptomatic relief  [ML]  2110 EKG 12-Lead Sinus tachycardia [ML]  2140 CT ABDOMEN PELVIS W CONTRAST No acute findings. Additional findings of small fat-containing ventral hernia and small hepatic hemangioma. [ML]  2150 CT Angio Chest PE W and/or Wo Contrast No evidence of PE [ML]  2220 Patient ambulated well prior to discharge -  [ML]    Clinical Course User Index [ML] Willma Duwaine CROME, PA    Data Reviewed / Actions Taken: Labs ordered/reviewed with my independent interpretation in ED course above. Imaging ordered/reviewed with my independent interpretation in ED course above. I agree with the radiologists interpretation.  EKG ordered/reviewed with my independent interpretation in ED course above. The patient was Continuous cardiac monitoring during the ED stay.  Management / Treatments: Patient given ondansetron , morphine , lactated ringer  bolus with symptomatic relief Reevaluation of the patient after these medicines showed that the patient improved. I have reviewed the patients home medicines and have made adjustments as needed  ED Course / Reassessments: Problem List: Flulike symptoms 58 year old female presented for flulike symptoms. Initial assessment included history, physical exam, and review of prior medical records.  Given patient's multiple symptoms a broad workup was completed.  Laboratory testing was obtained given clinical presentation and D-dimer was found to be mildly elevated - follow-up CTA was negative for PE.  Abdominal CT imaging was also without acute finding. There is low clinical suspicion for bacterial pneumonia, sepsis, meningitis, acute coronary syndrome, or other  serious pathology based on reassuring vital signs, laboratory studies and imaging without acute finding, and overall clinical appearance. The patient was treated symptomatically with antiemetics, analgesics, fluid bolus and supportive care. The patient demonstrated clinical improvement during the ED course and was deemed appropriate for outpatient management.  Discharge planning include strict return precautions for worsening respiratory symptoms, persistent high fevers, chest pain, inability to tolerate oral intake, or new neurological symptoms.  The patient was advised on continued supportive care at home, including hydration, rest, and antipyretic use, as well as isolation precautions to limit transmission.  Patient response: Improved with ED management Serial reassessments performed: Yes    Disposition: Disposition: Discharge with close follow-up with PCP for further evaluation care Rationale for disposition: Stable for discharge The disposition plan and rationale were discussed with the patient at the bedside, all questions were addressed, and the patient demonstrated understanding.  This note was produced using Electronics Engineer. While I have reviewed and verified all clinical information, transcription errors may remain.      Final diagnoses:  Viral illness    ED Discharge Orders          Ordered    ondansetron  (ZOFRAN ) 4 MG tablet  Every 8 hours PRN        12/04/24 2333    albuterol  (PROVENTIL ) (2.5 MG/3ML) 0.083% nebulizer solution  Every 4 hours PRN        12/04/24 2333               Janeth Terry L, [place] 12/12/24 0455    Patsey Lot, MD 12/12/24 1459  "

## 2024-12-04 NOTE — ED Triage Notes (Signed)
 Pt presents with nausea, vomiting, cough, chills, and body aches that started last night. Denies sick contacts. Pt has not received seasonal COVID19 or flu vaccine.

## 2024-12-07 ENCOUNTER — Ambulatory Visit: Payer: Self-pay

## 2024-12-07 NOTE — Telephone Encounter (Signed)
 FYI Only or Action Required?: FYI only for provider: ED advised.  Patient was last seen in primary care on 08/05/2024 by Terry Wilhelmena Lloyd Hilario, FNP.  Called Nurse Triage reporting Shortness of Breath.  Symptoms began several days ago.  Interventions attempted: Nothing.  Symptoms are: unchanged.  Triage Disposition: Go to ED Now (Notify PCP)  Patient/caregiver understands and will follow disposition?: Yes            Copied from CRM [id number]. Topic: Clinical - Red Word Triage >> Dec 07, 2024  1:35 PM Avram MATSU wrote: Red Word that prompted transfer to Nurse Triage: nebulizer machine is broken. Sob, wheezing.    ----------------------------------------------------------------------- From previous Reason for Contact - Order For Equipment (DME): Reason for CRM: nebulizer machine is broken. Sob, wheezing. Reason for Disposition  [1] MODERATE difficulty breathing (e.g., speaks in phrases, SOB even at rest, pulse 100-120) AND [2] NEW-onset or WORSE than normal  Answer Assessment - Initial Assessment Questions 1. RESPIRATORY STATUS: Describe your breathing? (e.g., wheezing, shortness of breath, unable to speak, severe coughing)      SOB  2. ONSET: When did this breathing problem begin?      12/28  3. PATTERN Does the difficult breathing come and go, or has it been constant since it started?      Constant   4. SEVERITY: How bad is your breathing? (e.g., mild, moderate, severe)      Moderate   5. RECURRENT SYMPTOM: Have you had difficulty breathing before? If Yes, ask: When was the last time? and What happened that time?       Yes, ongoing for years from COPD  6. CARDIAC HISTORY: Do you have any history of heart disease? (e.g., heart attack, angina, bypass surgery, angioplasty)      No   7. LUNG HISTORY: Do you have any history of lung disease?  (e.g., pulmonary embolus, asthma, emphysema)     COPD  8. CAUSE: What do you think is causing the  breathing problem?      COPD  9. OTHER SYMPTOMS: Do you have any other symptoms? (e.g., chest pain, cough, dizziness, fever, runny nose)     Wheezing, cough noted.    Patient called in to triage with complaints of SOB, wheezing  This has been ongoing for several days. Patient stated since 12/28. The patient stated  she was seen in ED on 12/28 for COPD exacerbation. She states her nebulizer machine is broke, and is needing an order for a new machine. Based upon the moderate SOB, patient was referred to the UC/ED as the office is closed today, and tomorrow.   She agrees with the plan of care, and will attend her 12/09/24 visit.  Protocols used: Breathing Difficulty-A-AH

## 2024-12-09 ENCOUNTER — Telehealth: Admitting: Family Medicine

## 2024-12-09 ENCOUNTER — Other Ambulatory Visit: Payer: Self-pay | Admitting: Family Medicine

## 2024-12-09 DIAGNOSIS — B349 Viral infection, unspecified: Secondary | ICD-10-CM

## 2024-12-09 DIAGNOSIS — J441 Chronic obstructive pulmonary disease with (acute) exacerbation: Secondary | ICD-10-CM | POA: Diagnosis not present

## 2024-12-09 MED ORDER — PROMETHAZINE-DM 6.25-15 MG/5ML PO SYRP: 5.0000 mL | ORAL_SOLUTION | Freq: Four times a day (QID) | ORAL | 0 refills | Status: AC | PRN

## 2024-12-09 NOTE — Telephone Encounter (Signed)
 Noted ED advised

## 2024-12-09 NOTE — Assessment & Plan Note (Signed)
 Labs and imaging studies reviewed. Promethazine -DM ordered for symptomatic relief of cough and congestion. Orders placed for a nebulizer machine. Patient encouraged to follow up if symptoms worsen or fail to improve.

## 2024-12-09 NOTE — Progress Notes (Signed)
 "  Virtual Visit via Video Note  I connected with Kayla Choi on 12/09/2024 at 10:00 AM EST by a video enabled telemedicine application and verified that I am speaking with the correct person using two identifiers.  Patient Location: Home Provider Location: Home Office  I discussed the limitations, risks, security, and privacy concerns of performing an evaluation and management service by video and the availability of in person appointments. I also discussed with the patient that there may be a patient responsible charge related to this service. The patient expressed understanding and agreed to proceed.  Subjective: PCP: Terry Wilhelmena Lloyd Hilario, FNP  Chief Complaint  Patient presents with   Follow-up    ER follow up    New Med Request    Requesting a new nebulizer rx due to her stopped working after being dropped    HPI The patient is seen today for an ER follow-up. She was previously evaluated in the ER for flu-like symptoms, and her workup was reassuring. She was discharged home with Zofran . Since discharge, the patient reports experiencing a nonproductive cough, sneezing, and a runny nose. She has been taking DayQuil with minimal relief of symptoms. Today, she is requesting new orders for placement of her nebulizer machine.  ROS: Per HPI Current Medications[1]  Observations/Objective: There were no vitals filed for this visit. Physical Exam Patient is well-developed, well-nourished in no acute distress.  Resting comfortably at home.  Head is normocephalic, atraumatic.  No labored breathing.  Speech is clear and coherent with logical content.  Patient is alert and oriented at baseline.   Assessment and Plan: COPD with acute exacerbation [facility]) Assessment & Plan: Labs and imaging studies reviewed. Promethazine -DM ordered for symptomatic relief of cough and congestion. Orders placed for a nebulizer machine. Patient encouraged to follow up if symptoms worsen or fail to  improve.   Orders: -     For home use only DME Nebulizer machine  Viral illness -     Promethazine -DM; Take 5 mLs by mouth 4 (four) times daily as needed.  Dispense: 118 mL; Refill: 0    Follow Up Instructions: No follow-ups on file.   I discussed the assessment and treatment plan with the patient. The patient was provided an opportunity to ask questions, and all were answered. The patient agreed with the plan and demonstrated an understanding of the instructions.   The patient was advised to call back or seek an in-person evaluation if the symptoms worsen or if the condition fails to improve as anticipated.  The above assessment and management plan was discussed with the patient. The patient verbalized understanding of and has agreed to the management plan.   Manolito Jurewicz  Z Bacchus, FNP    [1]  Current Outpatient Medications:    promethazine -dextromethorphan  (PROMETHAZINE -DM) 6.25-15 MG/5ML syrup, Take 5 mLs by mouth 4 (four) times daily as needed., Disp: 118 mL, Rfl: 0   acetaminophen  (TYLENOL ) 500 MG tablet, Take 500 mg by mouth every 6 (six) hours as needed for mild pain (pain score 1-3)., Disp: , Rfl:    albuterol  (PROVENTIL ) (2.5 MG/3ML) 0.083% nebulizer solution, Take 3 mLs (2.5 mg total) by nebulization every 4 (four) hours as needed for wheezing or shortness of breath., Disp: 75 mL, Rfl: 5   albuterol  (VENTOLIN  HFA) 108 (90 Base) MCG/ACT inhaler, Inhale 2 puffs into the lungs every 4 (four) hours as needed for wheezing or shortness of breath., Disp: 18 g, Rfl: 5   amLODipine  (NORVASC ) 10 MG tablet, Take 1  tablet (10 mg total) by mouth daily. Blood pressure medication, Disp: 90 tablet, Rfl: 1   Blood Glucose Monitoring Suppl (ACCU-CHEK GUIDE) w/Device KIT, 3 (three) times daily. as directed, Disp: , Rfl:    Blood Glucose Monitoring Suppl DEVI, 1 each by Does not apply route in the morning, at noon, and at bedtime. May substitute to any manufacturer covered by patient's insurance.,  Disp: 1 each, Rfl: 0   Cholecalciferol (VITAMIN D3) 50 MCG (2000 UT) capsule, Take 1 capsule (2,000 Units total) by mouth daily. (Patient not taking: Reported on 10/26/2024), Disp: 90 capsule, Rfl: 1   clobetasol  cream (TEMOVATE ) 0.05 %, Apply 1 Application topically 2 (two) times daily., Disp: 30 g, Rfl: 0   clopidogrel  (PLAVIX ) 75 MG tablet, Take 1 tablet (75 mg total) by mouth daily. Blood thinner- prevent blood clots, Disp: 30 tablet, Rfl: 3   Continuous Glucose Sensor (FREESTYLE LIBRE 3 SENSOR) MISC, Place 1 sensor on the skin every 14 days. Use to check glucose continuously, Disp: 2 each, Rfl: 2   gabapentin  (NEURONTIN ) 300 MG capsule, Take 1 capsule (300 mg total) by mouth 2 (two) times daily as needed (For pain)., Disp: 60 capsule, Rfl: 3   glipiZIDE  (GLUCOTROL  XL) 2.5 MG 24 hr tablet, Take 1 tablet (2.5 mg total) by mouth daily with breakfast., Disp: 90 tablet, Rfl: 1   linaclotide  (LINZESS ) 145 MCG CAPS capsule, Take 1 capsule (145 mcg total) by mouth daily before breakfast. Constipation, Disp: 30 capsule, Rfl: 5   lisinopril -hydrochlorothiazide  (ZESTORETIC ) 20-12.5 MG tablet, Take 1 tablet by mouth daily. Blood pressure medication, Disp: 90 tablet, Rfl: 3   methocarbamol  (ROBAXIN ) 500 MG tablet, Take 1 tablet (500 mg total) by mouth every 8 (eight) hours as needed for muscle spasms., Disp: 30 tablet, Rfl: 1   ondansetron  (ZOFRAN ) 4 MG tablet, Take 1 tablet (4 mg total) by mouth every 8 (eight) hours as needed for up to 7 days for nausea or vomiting., Disp: 21 tablet, Rfl: 0   pantoprazole  (PROTONIX ) 40 MG tablet, Take 1 tablet (40 mg total) by mouth 2 (two) times daily before a meal. Heartburn, Disp: 60 tablet, Rfl: 5   pregabalin  (LYRICA ) 25 MG capsule, Take 1 capsule (25 mg total) by mouth 2 (two) times daily., Disp: 60 capsule, Rfl: 0   rosuvastatin  (CRESTOR ) 40 MG tablet, Take 1 tablet (40 mg total) by mouth daily. Cholesterol medication, Disp: 90 tablet, Rfl: 3   sitaGLIPtin  (JANUVIA )  100 MG tablet, Take 1 tablet (100 mg total) by mouth daily. Diabetes medication, Disp: 90 tablet, Rfl: 3   topiramate  (TOPAMAX ) 50 MG tablet, Take 1 tablet (50 mg total) by mouth 2 (two) times daily. For Migraines, Disp: 60 tablet, Rfl: 6   traZODone  (DESYREL ) 50 MG tablet, Take 1 tablet (50 mg total) by mouth at bedtime as needed for sleep., Disp: 30 tablet, Rfl: 3  "

## 2024-12-19 ENCOUNTER — Telehealth: Payer: Self-pay | Admitting: Neurology

## 2024-12-19 ENCOUNTER — Telehealth: Payer: Self-pay | Admitting: Family Medicine

## 2024-12-19 NOTE — Telephone Encounter (Signed)
 Patient called to schedule an appointment. Schedule 05/18/25 at 3:30 pm, patient has not been using CPAP machine. RN, Particia contacted [facility]; they have been trying to get in contact with her for non-compliant and to return the machine.

## 2024-12-19 NOTE — Telephone Encounter (Unsigned)
 Copied from CRM #[id number]. Topic: Clinical - Medication Refill >> Dec 19, 2024 12:48 PM Zebedee SAUNDERS wrote: Medication: nebulizer machine  Has the patient contacted their pharmacy? Yes (Agent: If no, request that the patient contact the pharmacy for the refill. If patient does not wish to contact the pharmacy document the reason why and proceed with request.) (Agent: If yes, when and what did the pharmacy advise?)Pharmacy need script  This is the patient's preferred pharmacy:  [facility] - [place], [place] - [address] [place] [address] Phone: [phone] Fax: [phone]  Is this the correct pharmacy for this prescription? Yes If no, delete pharmacy and type the correct one.   Has the prescription been filled recently? Yes  Is the patient out of the medication? Yes  Has the patient been seen for an appointment in the last year OR does the patient have an upcoming appointment? Yes  Can we respond through MyChart? Yes  Agent: Please be advised that Rx refills may take up to 3 business days. We ask that you follow-up with your pharmacy.

## 2024-12-19 NOTE — Telephone Encounter (Signed)
 I called [facility].  Pt was noncompliant and this 05-06-2024 [facility] noted CMN , also 10-26-2024 emailed pt to return cpap.  Pt has not complied.  She states she needs mask.  She is using machine.  She needs to contact [facility].  But we can make appt to address her cpap issues, start process over.  Relayed to phone staff, Zelda PARAS.  She will relay to staff.

## 2025-01-13 ENCOUNTER — Telehealth: Payer: Self-pay

## 2025-01-13 NOTE — Telephone Encounter (Signed)
 Refaxed to [facility]

## 2025-01-13 NOTE — Telephone Encounter (Signed)
 Copied from CRM #[id number]. Topic: Clinical - Prescription Issue >> Jan 12, 2025  4:15 PM Kayla Choi wrote: Reason for CRM: patient is still waiting on a prescription for a nebulizer , she states she contacted pharmacy and they have not received anything . Please fu . She does not need the solution!

## 2025-05-18 ENCOUNTER — Ambulatory Visit: Admitting: Neurology
